# Patient Record
Sex: Male | Born: 1955
Health system: Southern US, Community
[De-identification: ages and names within clinical notes are randomized; demographics above are authoritative.]

## PROBLEM LIST (undated history)

## (undated) DIAGNOSIS — H269 Unspecified cataract: Secondary | ICD-10-CM

## (undated) DIAGNOSIS — M1990 Unspecified osteoarthritis, unspecified site: Secondary | ICD-10-CM

## (undated) DIAGNOSIS — Q825 Congenital non-neoplastic nevus: Secondary | ICD-10-CM

## (undated) DIAGNOSIS — G4730 Sleep apnea, unspecified: Secondary | ICD-10-CM

## (undated) DIAGNOSIS — I219 Acute myocardial infarction, unspecified: Secondary | ICD-10-CM

## (undated) DIAGNOSIS — S42009A Fracture of unspecified part of unspecified clavicle, initial encounter for closed fracture: Secondary | ICD-10-CM

## (undated) DIAGNOSIS — R51 Headache: Secondary | ICD-10-CM

## (undated) DIAGNOSIS — Z86018 Personal history of other benign neoplasm: Secondary | ICD-10-CM

## (undated) DIAGNOSIS — K219 Gastro-esophageal reflux disease without esophagitis: Secondary | ICD-10-CM

## (undated) DIAGNOSIS — C439 Malignant melanoma of skin, unspecified: Secondary | ICD-10-CM

## (undated) DIAGNOSIS — Q2381 Bicuspid aortic valve: Secondary | ICD-10-CM

## (undated) DIAGNOSIS — I350 Nonrheumatic aortic (valve) stenosis: Secondary | ICD-10-CM

## (undated) DIAGNOSIS — Q231 Congenital insufficiency of aortic valve: Secondary | ICD-10-CM

## (undated) DIAGNOSIS — Z86006 Personal history of melanoma in-situ: Secondary | ICD-10-CM

## (undated) DIAGNOSIS — I5189 Other ill-defined heart diseases: Secondary | ICD-10-CM

## (undated) DIAGNOSIS — I77810 Thoracic aortic ectasia: Secondary | ICD-10-CM

## (undated) DIAGNOSIS — R519 Headache, unspecified: Secondary | ICD-10-CM

## (undated) DIAGNOSIS — Q2112 Patent foramen ovale: Secondary | ICD-10-CM

## (undated) DIAGNOSIS — Q211 Atrial septal defect: Secondary | ICD-10-CM

## (undated) DIAGNOSIS — G4733 Obstructive sleep apnea (adult) (pediatric): Secondary | ICD-10-CM

## (undated) DIAGNOSIS — I517 Cardiomegaly: Secondary | ICD-10-CM

## (undated) DIAGNOSIS — R001 Bradycardia, unspecified: Secondary | ICD-10-CM

## (undated) DIAGNOSIS — E785 Hyperlipidemia, unspecified: Secondary | ICD-10-CM

## (undated) DIAGNOSIS — E669 Obesity, unspecified: Secondary | ICD-10-CM

## (undated) DIAGNOSIS — I2510 Atherosclerotic heart disease of native coronary artery without angina pectoris: Secondary | ICD-10-CM

## (undated) DIAGNOSIS — R0989 Other specified symptoms and signs involving the circulatory and respiratory systems: Secondary | ICD-10-CM

## (undated) HISTORY — PX: TONSILLECTOMY: SUR1361

## (undated) HISTORY — DX: Acute myocardial infarction, unspecified: I21.9

## (undated) HISTORY — DX: Thoracic aortic ectasia: I77.810

## (undated) HISTORY — PX: EYE SURGERY: SHX253

## (undated) HISTORY — DX: Atherosclerotic heart disease of native coronary artery without angina pectoris: I25.10

## (undated) HISTORY — DX: Bradycardia, unspecified: R00.1

## (undated) HISTORY — DX: Obesity, unspecified: E66.9

## (undated) HISTORY — DX: Atrial septal defect: Q21.1

## (undated) HISTORY — DX: Sleep apnea, unspecified: G47.30

## (undated) HISTORY — DX: Hyperlipidemia, unspecified: E78.5

## (undated) HISTORY — DX: Congenital insufficiency of aortic valve: Q23.1

## (undated) HISTORY — DX: Obstructive sleep apnea (adult) (pediatric): G47.33

## (undated) HISTORY — DX: Congenital non-neoplastic nevus: Q82.5

## (undated) HISTORY — DX: Cardiomegaly: I51.7

## (undated) HISTORY — DX: Malignant melanoma of skin, unspecified: C43.9

## (undated) HISTORY — DX: Patent foramen ovale: Q21.12

## (undated) HISTORY — DX: Unspecified cataract: H26.9

## (undated) HISTORY — DX: Bicuspid aortic valve: Q23.81

## (undated) HISTORY — DX: Gastro-esophageal reflux disease without esophagitis: K21.9

## (undated) HISTORY — DX: Other ill-defined heart diseases: I51.89

## (undated) HISTORY — DX: Other specified symptoms and signs involving the circulatory and respiratory systems: R09.89

## (undated) HISTORY — DX: Nonrheumatic aortic (valve) stenosis: I35.0

---
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
## 2005-11-15 ENCOUNTER — Emergency Department (HOSPITAL_COMMUNITY): Admission: EM | Admit: 2005-11-15 | Discharge: 2005-11-15 | Payer: Self-pay | Admitting: Emergency Medicine

## 2006-10-05 ENCOUNTER — Emergency Department (HOSPITAL_COMMUNITY): Admission: EM | Admit: 2006-10-05 | Discharge: 2006-10-06 | Payer: Self-pay | Admitting: Emergency Medicine

## 2007-10-28 ENCOUNTER — Emergency Department (HOSPITAL_COMMUNITY): Admission: EM | Admit: 2007-10-28 | Discharge: 2007-10-29 | Payer: Self-pay | Admitting: Emergency Medicine

## 2008-03-30 ENCOUNTER — Encounter: Admission: RE | Admit: 2008-03-30 | Discharge: 2008-03-30 | Payer: Self-pay | Admitting: Gastroenterology

## 2008-12-12 DIAGNOSIS — D239 Other benign neoplasm of skin, unspecified: Secondary | ICD-10-CM

## 2008-12-12 HISTORY — DX: Other benign neoplasm of skin, unspecified: D23.9

## 2009-04-07 DIAGNOSIS — Z86006 Personal history of melanoma in-situ: Secondary | ICD-10-CM

## 2009-04-07 HISTORY — DX: Personal history of melanoma in-situ: Z86.006

## 2009-04-07 HISTORY — PX: MELANOMA EXCISION: SHX5266

## 2009-07-19 ENCOUNTER — Other Ambulatory Visit: Payer: Self-pay | Admitting: Dermatology

## 2011-01-03 LAB — CBC
MCHC: 35.2
MCV: 88.9
Platelets: 169
RBC: 5.29

## 2011-01-03 LAB — URINALYSIS, ROUTINE W REFLEX MICROSCOPIC
Bilirubin Urine: NEGATIVE
Hgb urine dipstick: NEGATIVE
Nitrite: NEGATIVE
Protein, ur: NEGATIVE
Specific Gravity, Urine: 1.02
Urobilinogen, UA: 0.2

## 2011-01-03 LAB — COMPREHENSIVE METABOLIC PANEL
ALT: 37
AST: 34
Albumin: 4.5
CO2: 24
Calcium: 10.1
Creatinine, Ser: 0.94
GFR calc Af Amer: >60
GFR calc non Af Amer: >60
Sodium: 140
Total Protein: 7

## 2011-01-03 LAB — LIPASE, BLOOD: Lipase: 42

## 2011-01-03 LAB — POCT CARDIAC MARKERS
CKMB, poc: 1.8
CKMB, poc: 3.7
Myoglobin, poc: 79.3
Operator id: 264421
Troponin i, poc: <0.05
Troponin i, poc: <0.05

## 2011-01-03 LAB — DIFFERENTIAL
Eosinophils Absolute: 0.1
Eosinophils Relative: 2
Lymphocytes Relative: 29
Lymphs Abs: 1.8
Monocytes Absolute: 0.4
Monocytes Relative: 7

## 2011-01-21 LAB — COMPREHENSIVE METABOLIC PANEL
ALT: 35
Albumin: 4
Calcium: 9
Glucose, Bld: 133 — ABNORMAL HIGH
Potassium: 3.8
Sodium: 141
Total Protein: 6.6

## 2011-01-21 LAB — CBC
Hemoglobin: 15.1
MCHC: 35.4
Platelets: 204
RDW: 12.7

## 2011-01-21 LAB — PROTIME-INR: INR: 1

## 2011-01-21 LAB — APTT: aPTT: 28

## 2011-01-21 LAB — DIFFERENTIAL
Eosinophils Absolute: 0.1
Lymphs Abs: 1.6
Monocytes Absolute: 0.4
Monocytes Relative: 5
Neutro Abs: 6.3
Neutrophils Relative %: 74

## 2011-01-21 LAB — SAMPLE TO BLOOD BANK

## 2013-01-24 ENCOUNTER — Encounter: Payer: Self-pay | Admitting: *Deleted

## 2013-01-24 ENCOUNTER — Encounter: Payer: Self-pay | Admitting: Cardiology

## 2013-01-26 ENCOUNTER — Encounter: Payer: Self-pay | Admitting: Cardiology

## 2013-01-26 DIAGNOSIS — K219 Gastro-esophageal reflux disease without esophagitis: Secondary | ICD-10-CM | POA: Insufficient documentation

## 2013-01-26 DIAGNOSIS — Q2112 Patent foramen ovale: Secondary | ICD-10-CM | POA: Insufficient documentation

## 2013-01-26 DIAGNOSIS — I10 Essential (primary) hypertension: Secondary | ICD-10-CM | POA: Insufficient documentation

## 2013-01-26 DIAGNOSIS — Q211 Atrial septal defect: Secondary | ICD-10-CM | POA: Insufficient documentation

## 2013-01-26 DIAGNOSIS — I77810 Thoracic aortic ectasia: Secondary | ICD-10-CM | POA: Insufficient documentation

## 2013-01-26 DIAGNOSIS — I5189 Other ill-defined heart diseases: Secondary | ICD-10-CM | POA: Insufficient documentation

## 2013-01-26 DIAGNOSIS — E7800 Pure hypercholesterolemia, unspecified: Secondary | ICD-10-CM | POA: Insufficient documentation

## 2013-01-26 DIAGNOSIS — I517 Cardiomegaly: Secondary | ICD-10-CM | POA: Insufficient documentation

## 2013-01-26 DIAGNOSIS — I350 Nonrheumatic aortic (valve) stenosis: Secondary | ICD-10-CM | POA: Insufficient documentation

## 2013-01-27 ENCOUNTER — Ambulatory Visit (INDEPENDENT_AMBULATORY_CARE_PROVIDER_SITE_OTHER): Payer: 59 | Admitting: Cardiology

## 2013-01-27 ENCOUNTER — Encounter: Payer: Self-pay | Admitting: Cardiology

## 2013-01-27 VITALS — BP 152/100 | HR 66 | Ht 69.5 in | Wt 214.0 lb

## 2013-01-27 DIAGNOSIS — I5189 Other ill-defined heart diseases: Secondary | ICD-10-CM

## 2013-01-27 DIAGNOSIS — I519 Heart disease, unspecified: Secondary | ICD-10-CM

## 2013-01-27 DIAGNOSIS — Q2111 Secundum atrial septal defect: Secondary | ICD-10-CM

## 2013-01-27 DIAGNOSIS — I359 Nonrheumatic aortic valve disorder, unspecified: Secondary | ICD-10-CM

## 2013-01-27 DIAGNOSIS — Q211 Atrial septal defect: Secondary | ICD-10-CM

## 2013-01-27 DIAGNOSIS — Q2112 Patent foramen ovale: Secondary | ICD-10-CM

## 2013-01-27 DIAGNOSIS — I10 Essential (primary) hypertension: Secondary | ICD-10-CM

## 2013-01-27 DIAGNOSIS — I77810 Thoracic aortic ectasia: Secondary | ICD-10-CM

## 2013-01-27 DIAGNOSIS — I350 Nonrheumatic aortic (valve) stenosis: Secondary | ICD-10-CM

## 2013-01-27 MED ORDER — HYDROCHLOROTHIAZIDE 25 MG PO TABS: 12.5000 mg | ORAL_TABLET | Freq: Every day | ORAL | Status: DC

## 2013-01-27 NOTE — Progress Notes (Signed)
[address] [place], [place]  [address] Phone: [phone] Fax:  [phone]  Date:  01/27/2013   ID:  Todd Frank [id number], MRN [id number]  PCP:  No primary provider on file.  Cardiologist:  Armanda Magic, MD    History of Present Illness: Todd Frank is a 57 y.o. male with a history of HTN, dyslipidemia, mild to moderate AS, diastolic dysfunction, mildly dilated aortic root and small PFO.  He is doing well.  He denies any chest pain, SOB, DOE, LE edema, dizziness, palpitations or syncope.  He has a history of white coat HTN and runs normal at home.  He works out daily at [employer] on the elliptical and treadmill.   Wt Readings from Last 3 Encounters:  01/27/13 214 lb (97.07 kg)     Past Medical History  Diagnosis Date  . Obesity   . HTN (hypertension)   . GERD (gastroesophageal reflux disease)   . Melanoma of skin   . LVH (left ventricular hypertrophy)   . Melanoma   . Congenital nevus   . Diastolic dysfunction   . Aortic stenosis, mild     mild to moderate AS by echo 07/2012  . Dilated aortic root   . PFO (patent foramen ovale)     small by echo 2014  . Hypercholesteremia   . Hyperlipidemia     Current Outpatient Prescriptions  Medication Sig Dispense Refill  . aspirin 81 MG tablet Take 81 mg by mouth daily.      . cholecalciferol (VITAMIN D) 1000 UNITS tablet Take 1,000 Units by mouth daily.      . fish oil-omega-3 fatty acids 1000 MG capsule Take 1 g by mouth daily.      Marland Kitchen GLUCOSAMINE-CHONDROITIN PO Take 1 tablet by mouth daily.      . hydrochlorothiazide (HYDRODIURIL) 25 MG tablet Take 12.5 mg by mouth daily.       . Lecithin 400 MG CAPS Take by mouth daily.      . sildenafil (VIAGRA) 100 MG tablet Take 100 mg by mouth daily as needed for erectile dysfunction.       No current facility-administered medications for this visit.    Allergies:   No Known Allergies  Social History:  The patient  reports that he has never smoked. He does  not have any smokeless tobacco history on file.   Family History:  The patient's family history includes Diabetes in his mother; Heart attack in an other family member.   ROS:  Please see the history of present illness.      All other systems reviewed and negative.   PHYSICAL EXAM: VS:  BP 152/100  Pulse 66  Ht 5' 9.5" (1.765 m)  Wt 214 lb (97.07 kg)  BMI 31.16 kg/m2 Well nourished, well developed, in no acute distress HEENT: normal Neck: no JVD Cardiac:  normal S1, S2; RRR; 1/6 SM at LLSB to apex Lungs:  clear to auscultation bilaterally, no wheezing, rhonchi or rales Abd: soft, nontender, no hepatomegaly Ext: no edema Skin: warm and dry Neuro:  CNs 2-12 intact, no focal abnormalities noted  EKG:  NSR with nonspecific T wave abnormality inferiorly     ASSESSMENT AND PLAN:  1. HTN - BP elevated today but he has white coat HTN and BP normal at home on readings he brings in today  - continue HCTZ  - continue BMET 2.   Mild to moderate AS  - repeat echo in 07/2013  3.   Mildly dilated aortic root 4.   Diastolic dysfunction 5.   Small PFO  Followup with me in 6 months   Signed, Armanda Magic, MD 01/27/2013 9:16 AM

## 2013-01-27 NOTE — Patient Instructions (Signed)
A refill has been sent to your Pharmacy for the HCTZ  We gave you a rx for a BMET. Please have them fax [place] the results.   Your physician has requested that you have an echocardiogram. Echocardiography is a painless test that uses sound waves to create images of your heart. It provides your doctor with information about the size and shape of your heart and how well your heart's chambers and valves are working. This procedure takes approximately one hour. There are no restrictions for this procedure. (Schedule for April) A recall letter will be sent you to in February to call and schedule.   Your physician wants you to follow-up in: 6 months with Dr. Sherlyn Lick will receive a reminder letter in the mail two months in advance. If you don't receive a letter, please call our office to schedule the follow-up appointment.

## 2013-02-10 ENCOUNTER — Other Ambulatory Visit: Payer: Self-pay

## 2013-04-05 ENCOUNTER — Encounter: Payer: Self-pay | Admitting: Cardiology

## 2013-07-07 ENCOUNTER — Encounter: Payer: Self-pay | Admitting: Cardiology

## 2013-07-11 ENCOUNTER — Encounter: Payer: Self-pay | Admitting: Cardiology

## 2013-07-11 ENCOUNTER — Ambulatory Visit (HOSPITAL_COMMUNITY): Payer: 59 | Attending: Internal Medicine | Admitting: Radiology

## 2013-07-11 DIAGNOSIS — I350 Nonrheumatic aortic (valve) stenosis: Secondary | ICD-10-CM

## 2013-07-11 DIAGNOSIS — I77810 Thoracic aortic ectasia: Secondary | ICD-10-CM

## 2013-07-11 DIAGNOSIS — I359 Nonrheumatic aortic valve disorder, unspecified: Secondary | ICD-10-CM | POA: Insufficient documentation

## 2013-07-11 NOTE — Progress Notes (Signed)
Echocardiogram Performed. 

## 2013-07-12 ENCOUNTER — Encounter: Payer: Self-pay | Admitting: Cardiology

## 2013-07-13 ENCOUNTER — Other Ambulatory Visit: Payer: Self-pay | Admitting: General Surgery

## 2013-07-13 DIAGNOSIS — I77810 Thoracic aortic ectasia: Secondary | ICD-10-CM

## 2013-07-15 ENCOUNTER — Other Ambulatory Visit (HOSPITAL_COMMUNITY): Payer: 59

## 2013-07-20 ENCOUNTER — Encounter: Payer: Self-pay | Admitting: Cardiology

## 2013-07-22 ENCOUNTER — Encounter: Payer: Self-pay | Admitting: Cardiology

## 2013-08-03 ENCOUNTER — Encounter: Payer: Self-pay | Admitting: Cardiology

## 2013-10-11 ENCOUNTER — Other Ambulatory Visit (INDEPENDENT_AMBULATORY_CARE_PROVIDER_SITE_OTHER): Payer: 59

## 2013-10-11 ENCOUNTER — Ambulatory Visit (INDEPENDENT_AMBULATORY_CARE_PROVIDER_SITE_OTHER): Payer: 59 | Admitting: Family Medicine

## 2013-10-11 ENCOUNTER — Encounter: Payer: Self-pay | Admitting: Family Medicine

## 2013-10-11 VITALS — BP 144/88 | HR 68 | Ht 69.5 in | Wt 225.0 lb

## 2013-10-11 DIAGNOSIS — M7631 Iliotibial band syndrome, right leg: Secondary | ICD-10-CM

## 2013-10-11 DIAGNOSIS — M25569 Pain in unspecified knee: Secondary | ICD-10-CM

## 2013-10-11 DIAGNOSIS — IMO0002 Reserved for concepts with insufficient information to code with codable children: Secondary | ICD-10-CM

## 2013-10-11 DIAGNOSIS — M25561 Pain in right knee: Secondary | ICD-10-CM

## 2013-10-11 DIAGNOSIS — S83249A Other tear of medial meniscus, current injury, unspecified knee, initial encounter: Secondary | ICD-10-CM | POA: Insufficient documentation

## 2013-10-11 DIAGNOSIS — M629 Disorder of muscle, unspecified: Secondary | ICD-10-CM

## 2013-10-11 DIAGNOSIS — S83241A Other tear of medial meniscus, current injury, right knee, initial encounter: Secondary | ICD-10-CM

## 2013-10-11 DIAGNOSIS — M7630 Iliotibial band syndrome, unspecified leg: Secondary | ICD-10-CM | POA: Insufficient documentation

## 2013-10-11 MED ORDER — MELOXICAM 15 MG PO TABS: 15.0000 mg | ORAL_TABLET | Freq: Every day | ORAL | Status: DC

## 2013-10-11 NOTE — Assessment & Plan Note (Signed)
I reviewed with the patient the anatomy involved with ITB syndrome.  Given rehab program  - primarily ITB stretching program, core stability program, gluteus medius strengthening, hip flexion strengthening, and core. Additional proprioception and mild plyometrics program reviewed.  Reviewed plan of care and rehab is the primary treatment in this condition. Ten-day course of anti-inflammatories.  Return in 3 weeks for further evaluation and treatment.

## 2013-10-11 NOTE — Assessment & Plan Note (Signed)
Patient has a very mild medial meniscal tear with some very mild displacement. At the patient is going to do very well. We discussed icing, home exercises were given to him, we discussed bracing which patient declined. Patient will try to do these different interventions and come back again in 3 weeks for further evaluation. Discussed with patient is likely take some more the ballpark of 4-6 weeks until improvement.

## 2013-10-11 NOTE — Progress Notes (Signed)
[name] [place], [place] [address] Phone: [phone] Subjective:     CC: Leg and hip pain  Todd Frank is a 58 y.o. male coming in with complaint of right knee pain. Patient states that he's had this pain for a couple months. Patient states most of it seems to be actually near the hip and radiates down the lateral aspect of the leg. Patient states it does pass his knee seldomly. Patient denies any numbness or weakness. Patient states it occurs more after sitting for a long amount of time and gets better actually with activity. States that he is unable to actually make it painful with touch. Denies any back pain. Patient with the severity of 6/10. Patient feels that it is starting to become more chronic and that is what is concerning. Patient also stated 2 weeks ago he started having some more medial knee pain after taking a step wrong. Patient states that he did not have any swelling but was having pain with just regular ambulation. Over the course last 2 weeks so it is 80% better and only giving him some discomfort when he goes downstairs. Denies any catching, clicking, or giving out on him. This is pain as 2/10.     Past medical history, social, surgical and family history all reviewed in electronic medical record.   Review of Systems: No headache, visual changes, nausea, vomiting, diarrhea, constipation, dizziness, abdominal pain, skin rash, fevers, chills, night sweats, weight loss, swollen lymph nodes, body aches, joint swelling, muscle aches, chest pain, shortness of breath, mood changes.   Objective There were no vitals taken for this visit.  General: No apparent distress alert and oriented x3 mood and affect normal, dressed appropriately.  HEENT: Pupils equal, extraocular movements intact  Respiratory: Patient's speak in full sentences and does not appear short of breath  Cardiovascular: No lower extremity edema, non  tender, no erythema  Skin: Warm dry intact with no signs of infection or rash on extremities or on axial skeleton.  Abdomen: Soft nontender  Neuro: Cranial nerves II through XII are intact, neurovascularly intact in all extremities with 2+ DTRs and 2+ pulses.  Lymph: No lymphadenopathy of posterior or anterior cervical chain or axillae bilaterally.  Gait normal with good balance and coordination.  MSK:  Non tender with full range of motion and good stability and symmetric strength and tone of shoulders, elbows, wrist,  and ankles bilaterally.  Hip: Right ROM IR: 35 Deg, ER: 45 Deg, Flexion: 120 Deg, Extension: 100 Deg, Abduction: 45 Deg, Adduction: 45 Deg Strength IR: 5/5, ER: 5/5, Flexion: 5/5, Extension: 5/5, Abduction: 4/5, Adduction: 4/5 Pelvic alignment unremarkable to inspection and palpation. Standing hip rotation and gait without trendelenburg sign / unsteadiness. Greater trochanter without tenderness to palpation. No tenderness over piriformis and greater trochanter. Positive Faber but negative FADIR No SI joint tenderness and normal minimal SI movement. Contralateral hip unremarkable Knee: Right Normal to inspection with no erythema or effusion or obvious bony abnormalities. Palpation normal with no warmth, joint line tenderness, patellar tenderness, or condyle tenderness. ROM full in flexion and extension and lower leg rotation. Ligaments with solid consistent endpoints including ACL, PCL, LCL, MCL. Negative Mcmurray's, Apley's, and Thessalonian tests. Non painful patellar compression. Patellar glide without crepitus. Patellar and quadriceps tendons unremarkable. Hamstring and quadriceps strength is normal. Contralateral knee unremarkable.  MSK US performed of: Right knee This study was ordered, performed, and interpreted by Charlann Boxer D.O.  Knee:  All structures visualized. Anteromedial meniscus does have what appears to be a healing meniscal tear. Patient still has a  very minimal amount of displacement. Patient does have increasing Doppler flow in this area. Anterolateral, posteromedial, and posterolateral menisci unremarkable without tearing, fraying, effusion, or displacement. Patellar Tendon unremarkable on long and transverse views without effusion. No abnormality of prepatellar bursa. LCL and MCL unremarkable on long and transverse views. Patient does have significant thickening of the distal ITB and. No abnormality of origin of medial or lateral head of the gastrocnemius.  IMPRESSION:  Chronic distal ITB band syndrome in healing medial meniscal tear.       Impression and Recommendations:     This case required medical decision making of moderate complexity.

## 2013-10-11 NOTE — Patient Instructions (Addendum)
Good to see you Vitamin D 2000 IU daily Turmeric 500mg  twice daily.  Tell your wife she has you on the good stuff.  Exercises 3 times a week for knee and for IT band.  Exercises on wall.  Heel and butt touching.  Raise leg 6 inches and hold 2 seconds.  Down slow for count of 4 seconds.  1 set of 30 reps daily on both sides.  Keep doing your other exercises but consider decreasing time on treadmill or elliptical and increasing intensity.  Ice 20 minutes after activity.  Meloxicam daily for 10 days then as needed Come back again in 3 weeks.

## 2013-11-01 ENCOUNTER — Ambulatory Visit (INDEPENDENT_AMBULATORY_CARE_PROVIDER_SITE_OTHER): Payer: 59 | Admitting: Family Medicine

## 2013-11-01 ENCOUNTER — Encounter: Payer: Self-pay | Admitting: Family Medicine

## 2013-11-01 VITALS — BP 156/98 | HR 70 | Ht 70.0 in | Wt 225.0 lb

## 2013-11-01 DIAGNOSIS — G5700 Lesion of sciatic nerve, unspecified lower limb: Secondary | ICD-10-CM

## 2013-11-01 DIAGNOSIS — G5701 Lesion of sciatic nerve, right lower limb: Secondary | ICD-10-CM

## 2013-11-01 MED ORDER — CYCLOBENZAPRINE HCL 10 MG PO TABS: 10.0000 mg | ORAL_TABLET | Freq: Three times a day (TID) | ORAL | Status: DC | PRN

## 2013-11-01 NOTE — Patient Instructions (Signed)
Good to see you Lets try more of piriformis syndrome Try the Pennsaid and if you like it call me and I will send you a big bottle.  Look at new handout and do exercises 3 times a week.  Ice is still your friend Exercises on wall still Flexeril if you need it but may make you sleepy. Try at home first. Can take half a pill Tennis ball in back right pocket with driving and sitting at work.   Foam roller is good For calf try a compression sleeve during the day Come back in 1 month.

## 2013-11-01 NOTE — Progress Notes (Signed)
  [name] [place], [place] [address] Phone: [phone] Subjective:     CC: Leg and hip pain  [id number] Todd Frank is a 58 y.o. male coming in with complaint of right knee pain. Patient was seen previously was diagnosed with more of an acute meniscal tear as well as lateral iliotibial band. The patient initially states that the knee pain is completely resolved at this time. Patient actually states more the pain seems to be on the lateral aspect of the leg and now actually over the buttocks region as well. Patient has been doing the exercises and stretches that was given to him previously and was making some improvement. Patient states he has been having more pain in the buttocks though. Patient states that he does the stretches often he doesn't have as much pain. Denies any back pain associated with this. States that it hurts more after sitting for long amount of time.      Past medical history, social, surgical and family history all reviewed in electronic medical record.   Review of Systems: No headache, visual changes, nausea, vomiting, diarrhea, constipation, dizziness, abdominal pain, skin rash, fevers, chills, night sweats, weight loss, swollen lymph nodes, body aches, joint swelling, muscle aches, chest pain, shortness of breath, mood changes.   Objective Blood pressure 156/98, pulse 70, height 5\' 10"  (1.778 m), weight 225 lb (102.059 kg), SpO2 96.00%.  General: No apparent distress alert and oriented x3 mood and affect normal, dressed appropriately.  HEENT: Pupils equal, extraocular movements intact  Respiratory: Patient's speak in full sentences and does not appear short of breath  Cardiovascular: No lower extremity edema, non tender, no erythema  Skin: Warm dry intact with no signs of infection or rash on extremities or on axial skeleton.  Abdomen: Soft nontender  Neuro: Cranial nerves II through XII are intact,  neurovascularly intact in all extremities with 2+ DTRs and 2+ pulses.  Lymph: No lymphadenopathy of posterior or anterior cervical chain or axillae bilaterally.  Gait normal with good balance and coordination.  MSK:  Non tender with full range of motion and good stability and symmetric strength and tone of shoulders, elbows, wrist,  and ankles bilaterally.  Hip: Right ROM IR: 35 Deg, ER: 45 Deg, Flexion: 120 Deg, Extension: 100 Deg, Abduction: 45 Deg, Adduction: 45 Deg Strength IR: 5/5, ER: 5/5, Flexion: 5/5, Extension: 5/5, Abduction: 4/5, Adduction: 4/5 Pelvic alignment unremarkable to inspection and palpation. Standing hip rotation and gait without trendelenburg sign / unsteadiness. Greater trochanter without tenderness to palpation. Tender over the right piriformis Positive Corky Sox but negative FADIR No SI joint tenderness and normal minimal SI movement. Contralateral hip unremarkable Knee: Right Normal to inspection with no erythema or effusion or obvious bony abnormalities. Palpation normal with no warmth, joint line tenderness, patellar tenderness, or condyle tenderness. ROM full in flexion and extension and lower leg rotation. Ligaments with solid consistent endpoints including ACL, PCL, LCL, MCL. Negative Mcmurray's, Apley's, and Thessalonian tests. Non painful patellar compression. Patellar glide without crepitus. Patellar and quadriceps tendons unremarkable. Hamstring and quadriceps strength is normal. Contralateral knee unremarkable.      Impression and Recommendations:     This case required medical decision making of moderate complexity.

## 2013-11-01 NOTE — Assessment & Plan Note (Signed)
I believe patient's pain is secondary more to the piriformis that is giving him some mild radicular symptoms down the lateral aspect of his leg. Patient was getting some benefit from iliotibial band syndrome but now I think will be more benefit with focusing on the piriformis. We discussed manual massage as well as icing protocol. We discussed proper shoe wear and was given patient was given a new home exercise program. Patient will try these interventions and come back and see me again in 3-4 weeks.

## 2013-11-06 ENCOUNTER — Encounter: Payer: Self-pay | Admitting: Family Medicine

## 2013-11-07 MED ORDER — DICLOFENAC SODIUM 2 % TD SOLN: TRANSDERMAL | Status: DC

## 2013-12-02 ENCOUNTER — Encounter: Payer: Self-pay | Admitting: Family Medicine

## 2013-12-02 ENCOUNTER — Other Ambulatory Visit: Payer: Self-pay | Admitting: Family Medicine

## 2013-12-02 ENCOUNTER — Ambulatory Visit (INDEPENDENT_AMBULATORY_CARE_PROVIDER_SITE_OTHER): Payer: 59 | Admitting: Family Medicine

## 2013-12-02 ENCOUNTER — Ambulatory Visit (INDEPENDENT_AMBULATORY_CARE_PROVIDER_SITE_OTHER)
Admission: RE | Admit: 2013-12-02 | Discharge: 2013-12-02 | Disposition: A | Discharge status: Home / Self Care | Payer: 59 | Source: Ambulatory Visit | Attending: Family Medicine | Admitting: Family Medicine

## 2013-12-02 VITALS — BP 140/82 | HR 80 | Ht 70.0 in | Wt 219.0 lb

## 2013-12-02 DIAGNOSIS — M2420 Disorder of ligament, unspecified site: Secondary | ICD-10-CM

## 2013-12-02 DIAGNOSIS — G5700 Lesion of sciatic nerve, unspecified lower limb: Secondary | ICD-10-CM

## 2013-12-02 DIAGNOSIS — IMO0001 Reserved for inherently not codable concepts without codable children: Secondary | ICD-10-CM

## 2013-12-02 DIAGNOSIS — M629 Disorder of muscle, unspecified: Secondary | ICD-10-CM

## 2013-12-02 DIAGNOSIS — G5701 Lesion of sciatic nerve, right lower limb: Secondary | ICD-10-CM

## 2013-12-02 DIAGNOSIS — R253 Fasciculation: Secondary | ICD-10-CM | POA: Insufficient documentation

## 2013-12-02 DIAGNOSIS — R259 Unspecified abnormal involuntary movements: Secondary | ICD-10-CM

## 2013-12-02 DIAGNOSIS — M6289 Other specified disorders of muscle: Secondary | ICD-10-CM | POA: Insufficient documentation

## 2013-12-02 DIAGNOSIS — M791 Myalgia, unspecified site: Secondary | ICD-10-CM

## 2013-12-02 NOTE — Assessment & Plan Note (Signed)
Patient will speak him to formal physical therapy. Continue the other conservative therapy at this time. We discussed different hamstring exercises that could be helpful if the differential also includes a proximal hamstring injury. Patient does have tightness of the hamstring. Patient come back in 4 weeks for further evaluation.

## 2013-12-02 NOTE — Assessment & Plan Note (Signed)
Patient given more hamstring exercises I think will be beneficial. Patient will be sent to formal physical therapy to see if they can tease out the muscle imbalances and palpation in a more complete manner. Patient will continue the home exercises. Patient will come back and see me again in 4 weeks for further evaluation.

## 2013-12-02 NOTE — Progress Notes (Signed)
  [name] [place], [place] [address] Phone: [phone] Subjective:     CC: Leg and hip pain  [id number] Todd Frank is a 58 y.o. male coming in with complaint of right knee pain. Patient was seen previously and states that his knee pain is significantly better. Still has some mild stiffness overall but continues to improve. Patient states though unfortunately he continues to have the right piriformis type syndrome. Patient states with the home exercises as well as the manual massage he has made some improvement but continues to have stiffness especially after activity. Patient states that it is not enough to stop him from any activities and is able to do daily activities including lifting weights. Patient denies any nighttime awakening. Only has discomfort after sitting for a long amount of time. He is having some mild new symptoms with cramping he states in the legs bilaterally. States that this is minimal overall. This seems to be increasing frequency.     Past medical history, social, surgical and family history all reviewed in electronic medical record.   Review of Systems: No headache, visual changes, nausea, vomiting, diarrhea, constipation, dizziness, abdominal pain, skin rash, fevers, chills, night sweats, weight loss, swollen lymph nodes, body aches, joint swelling, muscle aches, chest pain, shortness of breath, mood changes.   Objective Blood pressure 140/82, pulse 80, height 5\' 10"  (1.778 m), weight 219 lb (99.338 kg), SpO2 97.00%.  General: No apparent distress alert and oriented x3 mood and affect normal, dressed appropriately.  HEENT: Pupils equal, extraocular movements intact  Respiratory: Patient's speak in full sentences and does not appear short of breath  Cardiovascular: No lower extremity edema, non tender, no erythema  Skin: Warm dry intact with no signs of infection or rash on extremities or on axial skeleton.    Abdomen: Soft nontender  Neuro: Cranial nerves II through XII are intact, neurovascularly intact in all extremities with 2+ DTRs and 2+ pulses.  Lymph: No lymphadenopathy of posterior or anterior cervical chain or axillae bilaterally.  Gait normal with good balance and coordination.  MSK:  Non tender with full range of motion and good stability and symmetric strength and tone of shoulders, elbows, wrist,  and ankles bilaterally.  Hip: Right ROM IR: 35 Deg, ER: 45 Deg, Flexion: 120 Deg, Extension: 100 Deg, Abduction: 45 Deg, Adduction: 45 Deg Strength IR: 5/5, ER: 5/5, Flexion: 5/5, Extension: 5/5, Abduction: 4/5, Adduction: 4/5 Pelvic alignment unremarkable to inspection and palpation. Standing hip rotation and gait without trendelenburg sign / unsteadiness. Greater trochanter without tenderness to palpation. Mild tender over the right piriformis Positive Corky Sox but significantly less tender than previous exam negative FADIR No SI joint tenderness and normal minimal SI movement. Contralateral hip unremarkable Knee: Right Normal to inspection with no erythema or effusion or obvious bony abnormalities. Palpation normal with no warmth, joint line tenderness, patellar tenderness, or condyle tenderness. ROM full in flexion and extension and lower leg rotation. Ligaments with solid consistent endpoints including ACL, PCL, LCL, MCL. Negative Mcmurray's, Apley's, and Thessalonian tests. Non painful patellar compression. Patellar glide without crepitus. Patellar and quadriceps tendons unremarkable. Hamstring and quadriceps strength is normal. Patient is tight on his right hamstring compared to the contralateral side. Contralateral knee unremarkable. No fasciculations noted today on legs.      Impression and Recommendations:     This case required medical decision making of moderate complexity.

## 2013-12-02 NOTE — Patient Instructions (Signed)
Great to see you Continue what you are doing  Add in the hamstring stretches Physical therapy will be calling you.  Xrays downstairs today.  Labs at [facility].  See you again in 4 weeks.

## 2013-12-02 NOTE — Assessment & Plan Note (Signed)
Patient is giving history of some fasciculations. With the patient having chronic tendinopathy sit appears to do think that further workup is warranted. Labs ordered today to rule out any thyroid, iron deficiency, or inflammation that could be contributing.

## 2013-12-03 LAB — VITAMIN B12: VITAMIN B 12: 305 pg/mL (ref 211–946)

## 2013-12-03 LAB — SEDIMENTATION RATE: Sed Rate: 2 mm/hr (ref 0–30)

## 2013-12-03 LAB — T4, FREE: Free T4: 1.17 ng/dL (ref 0.82–1.77)

## 2013-12-03 LAB — T3, FREE: T3 FREE: 2.9 pg/mL (ref 2.0–4.4)

## 2013-12-03 LAB — IRON: Iron: 81 ug/dL (ref 40–155)

## 2013-12-03 LAB — VITAMIN D 25 HYDROXY (VIT D DEFICIENCY, FRACTURES): Vit D, 25-Hydroxy: 35 ng/mL (ref 30.0–100.0)

## 2013-12-03 LAB — TSH: TSH: 1.79 u[IU]/mL (ref 0.450–4.500)

## 2013-12-30 ENCOUNTER — Encounter: Payer: Self-pay | Admitting: Family Medicine

## 2013-12-30 ENCOUNTER — Ambulatory Visit (INDEPENDENT_AMBULATORY_CARE_PROVIDER_SITE_OTHER): Payer: 59 | Admitting: Family Medicine

## 2013-12-30 VITALS — BP 142/86 | HR 79 | Ht 70.0 in | Wt 224.0 lb

## 2013-12-30 DIAGNOSIS — G5701 Lesion of sciatic nerve, right lower limb: Secondary | ICD-10-CM

## 2013-12-30 DIAGNOSIS — M629 Disorder of muscle, unspecified: Secondary | ICD-10-CM

## 2013-12-30 DIAGNOSIS — G5700 Lesion of sciatic nerve, unspecified lower limb: Secondary | ICD-10-CM

## 2013-12-30 NOTE — Progress Notes (Signed)
  [facility] [place],  [address] Phone: [phone] Subjective:     CC: Leg and hip pain  [id number] Todd Frank is a 58 y.o. male coming in with complaint of right knee pain. She was found to have an iliotibial band syndrome as well as piriformis syndrome. Patient was having improvement of the iliotibial band to continue to have piriformis syndrome. Patient was sent to formal physical therapy. Patient states that he continues to improve. Patient is able to do all activities of daily living. Still has some mild discomfort that seems to be in the bilateral buttocks now more than truly the right side. Patient states though that he is able to sleep comfortably at night. Patient has not taken any pain medications at this time. Patient of course would like to be pain free but is happy with the results so far.     Past medical history, social, surgical and family history all reviewed in electronic medical record.   Review of Systems: No headache, visual changes, nausea, vomiting, diarrhea, constipation, dizziness, abdominal pain, skin rash, fevers, chills, night sweats, weight loss, swollen lymph nodes, body aches, joint swelling, muscle aches, chest pain, shortness of breath, mood changes.   Objective Blood pressure 142/86, pulse 79, height 5\' 10"  (1.778 m), weight 224 lb (101.606 kg), SpO2 95.00%.  General: No apparent distress alert and oriented x3 mood and affect normal, dressed appropriately.  HEENT: Pupils equal, extraocular movements intact  Respiratory: Patient's speak in full sentences and does not appear short of breath  Cardiovascular: No lower extremity edema, non tender, no erythema  Skin: Warm dry intact with no signs of infection or rash on extremities or on axial skeleton.  Abdomen: Soft nontender  Neuro: Cranial nerves II through XII are intact, neurovascularly intact in all extremities with 2+ DTRs and 2+ pulses.  Lymph:  No lymphadenopathy of posterior or anterior cervical chain or axillae bilaterally.  Gait normal with good balance and coordination.  MSK:  Non tender with full range of motion and good stability and symmetric strength and tone of shoulders, elbows, wrist,  and ankles bilaterally.  Hip: Right ROM IR: 35 Deg, ER: 45 Deg, Flexion: 120 Deg, Extension: 100 Deg, Abduction: 45 Deg, Adduction: 45 Deg Strength IR: 5/5, ER: 5/5, Flexion: 5/5, Extension: 5/5, Abduction: 4/5, Adduction: 4/5 Pelvic alignment unremarkable to inspection and palpation. Standing hip rotation and gait without trendelenburg sign / unsteadiness. Greater trochanter without tenderness to palpation. Patient is actually more tender over the ischial bursitis bilaterally today. Positive Faber but significantly less tender than previous exam negative FADIR No SI joint tenderness and normal minimal SI movement. Contralateral hip unremarkable Knee: Right Normal to inspection with no erythema or effusion or obvious bony abnormalities. Palpation normal with no warmth, joint line tenderness, patellar tenderness, or condyle tenderness. ROM full in flexion and extension and lower leg rotation. Ligaments with solid consistent endpoints including ACL, PCL, LCL, MCL. Negative Mcmurray's, Apley's, and Thessalonian tests. Non painful patellar compression. Patellar glide without crepitus. Patellar and quadriceps tendons unremarkable. Hamstring and quadriceps strength is normal. Patient is tight on his right hamstring compared to the contralateral side. Contralateral knee unremarkable. No fasciculations noted today on legs.      Impression and Recommendations:     This case required medical decision making of moderate complexity.

## 2013-12-30 NOTE — Assessment & Plan Note (Signed)
The patient continues to improve overall. Patient does have some mild to moderate osteoarthritic changes of the lower back and we'll continue to monitor. Patient is responding to conservative therapy so we will make no changes at this time. Patient was given some strengthening exercises to try to help with the hamstrings. Patient was showed proper technique today. Patient will come back and see me again in 3-4 weeks

## 2013-12-30 NOTE — Patient Instructions (Signed)
Good to see you Hamstring and nordic  Exercises only 15 degrees and 2 sets of 10 reps 3 times a week and increase angle when appropriate it.  Ice is your friend.  Compression sleeve on hamstring when exercises.  Continue physical therapy Come back in 4-6 weeks and if not perfect we will inject ischial bursae.

## 2013-12-30 NOTE — Assessment & Plan Note (Signed)
I believe the patient still has significant tightness of the hamstrings bilaterally and may have some mild ischial bursitis overall. Patient has been making improvement so with formal physical therapy and I would like to continue for another 4 weeks. We discussed continuing icing and we talked about the potential of compression sleeves to the intrinsic to be beneficial. We discussed ice after activity still. We discussed using the over-the-counter anti-inflammatories on an as-needed basis. Patient will come back and see me again in 4 weeks for further evaluation. Continuing to have difficulty like to do an ultrasound to rule out ischial bursitis and questionable interventional  Spent greater than 25 minutes with patient face-to-face and had greater than 50% of counseling including as described above in assessment and plan.

## 2014-02-22 ENCOUNTER — Other Ambulatory Visit: Payer: Self-pay | Admitting: *Deleted

## 2014-02-22 MED ORDER — HYDROCHLOROTHIAZIDE 25 MG PO TABS: 12.5000 mg | ORAL_TABLET | Freq: Every day | ORAL | Status: DC

## 2014-06-21 ENCOUNTER — Telehealth: Payer: Self-pay | Admitting: Cardiology

## 2014-06-21 NOTE — Telephone Encounter (Signed)
New Message  Pt called to sched echo, was told there was a carotid recall in system. Pt requested to speak w/ Rn about upcoming appts- if he needs both; please call back and discuss.

## 2014-06-21 NOTE — Telephone Encounter (Signed)
Instructed [place] to schedule only ECHO. Patient is one year behind on appointments.

## 2014-06-22 ENCOUNTER — Other Ambulatory Visit: Payer: Self-pay | Admitting: Radiology

## 2014-06-22 DIAGNOSIS — I350 Nonrheumatic aortic (valve) stenosis: Secondary | ICD-10-CM

## 2014-07-04 ENCOUNTER — Other Ambulatory Visit: Payer: Self-pay | Admitting: Cardiology

## 2014-07-04 MED ORDER — HYDROCHLOROTHIAZIDE 25 MG PO TABS: 12.5000 mg | ORAL_TABLET | Freq: Every day | ORAL | Status: DC

## 2014-07-11 ENCOUNTER — Other Ambulatory Visit (INDEPENDENT_AMBULATORY_CARE_PROVIDER_SITE_OTHER): Payer: 59

## 2014-07-11 ENCOUNTER — Other Ambulatory Visit: Payer: Self-pay

## 2014-07-11 DIAGNOSIS — I712 Thoracic aortic aneurysm, without rupture: Secondary | ICD-10-CM | POA: Diagnosis not present

## 2014-07-11 DIAGNOSIS — I359 Nonrheumatic aortic valve disorder, unspecified: Secondary | ICD-10-CM

## 2014-07-11 DIAGNOSIS — I350 Nonrheumatic aortic (valve) stenosis: Secondary | ICD-10-CM

## 2014-07-13 ENCOUNTER — Telehealth: Payer: Self-pay | Admitting: Cardiology

## 2014-07-13 NOTE — Telephone Encounter (Signed)
pt rtn call re results, pls call

## 2014-07-13 NOTE — Telephone Encounter (Signed)
The pt is advised of his Echo results and he verbalized understanding.

## 2014-08-02 ENCOUNTER — Encounter: Payer: Self-pay | Admitting: Family Medicine

## 2014-08-02 ENCOUNTER — Ambulatory Visit (INDEPENDENT_AMBULATORY_CARE_PROVIDER_SITE_OTHER): Payer: 59 | Admitting: Family Medicine

## 2014-08-02 VITALS — BP 130/90 | HR 78 | Ht 70.0 in | Wt 212.0 lb

## 2014-08-02 DIAGNOSIS — G5701 Lesion of sciatic nerve, right lower limb: Secondary | ICD-10-CM

## 2014-08-02 MED ORDER — GABAPENTIN 100 MG PO CAPS: 100.0000 mg | ORAL_CAPSULE | Freq: Every day | ORAL | Status: DC

## 2014-08-02 NOTE — Assessment & Plan Note (Signed)
Patient does have more of the worsening of a piriformis syndrome with an do think sciatica secondary to compression on the nerve. We have discussed different treatment options and patient has elected to try gabapentin. Patient was given a prescription as well patient given home exercises well. Patient would like to try the injection. Patient will bring a driver at his next follow-up and we will discuss potentially doing an injection the piriformis muscle to see if this will be therapeutic as well as potentially diagnostic.  If patient does not respond to any these conservative therapies further imaging is necessary especially for lumbar MRI.  Spent  25 minutes with patient face-to-face and had greater than 50% of counseling including as described above in assessment and plan.

## 2014-08-02 NOTE — Progress Notes (Signed)
Pre visit review using our clinic review tool, if applicable. No additional management support is needed unless otherwise documented below in the visit note. 

## 2014-08-02 NOTE — Patient Instructions (Addendum)
Good to see you.  Ice will still be your friend.  Continue with the tennis ball We will do injection Monday Try medicine 3 times a day for 3 days Gabapentin at night, it is at your pharmacy.  See you soon.

## 2014-08-02 NOTE — Progress Notes (Signed)
  [name] [address], [place] [address] Phone: [phone] Subjective:     CC: Leg and hip pain  PPJ:[id number] JACERE PANGBORN is a 59 y.o. male coming in with complaint of right knee pain. She was found to have an iliotibial band syndrome as well as piriformis syndrome. Patient had been doing relatively well but continues to have pain. Patient states that it never became pain-free. Patient can do daily activities but after sitting a long amount of time had severe pain in has almost a dull aching sensation that seems to go down the posterior as well as lateral aspects of the leg all the way to his foot. Patient states that this is somewhat new and is a little more concerning. States that the back pain seems to be well controlled though.    patient did have imaging previously on August 2015. Patient does have some mild degenerative changes of the hips bilaterally as well as mild osteophytic changes of the lumbar spine but no acute findings.  Past medical history, social, surgical and family history all reviewed in electronic medical record.   Review of Systems: No headache, visual changes, nausea, vomiting, diarrhea, constipation, dizziness, abdominal pain, skin rash, fevers, chills, night sweats, weight loss, swollen lymph nodes, body aches, joint swelling, muscle aches, chest pain, shortness of breath, mood changes.   Objective Blood pressure 130/90, pulse 78, height 5\' 10"  (1.778 m), weight 212 lb (96.163 kg), SpO2 97 %.  General: No apparent distress alert and oriented x3 mood and affect normal, dressed appropriately.  HEENT: Pupils equal, extraocular movements intact  Respiratory: Patient's speak in full sentences and does not appear short of breath  Cardiovascular: No lower extremity edema, non tender, no erythema  Skin: Warm dry intact with no signs of infection or rash on extremities or on axial skeleton.  Abdomen: Soft nontender  Neuro:  Cranial nerves II through XII are intact, neurovascularly intact in all extremities with 2+ DTRs and 2+ pulses.  Lymph: No lymphadenopathy of posterior or anterior cervical chain or axillae bilaterally.  Gait normal with good balance and coordination.  MSK:  Non tender with full range of motion and good stability and symmetric strength and tone of shoulders, elbows, wrist,  and ankles bilaterally.  Hip: Right ROM IR: 35 Deg, ER: 45 Deg, Flexion: 120 Deg, Extension: 100 Deg, Abduction: 45 Deg, Adduction: 45 Deg Strength IR: 5/5, ER: 5/5, Flexion: 5/5, Extension: 5/5, Abduction: 4/5, Adduction: 4/5 Pelvic alignment unremarkable to inspection and palpation. Does have some tightness compared to the contralateral side. Standing hip rotation and gait without trendelenburg sign / unsteadiness. Greater trochanter without tenderness to palpation. Patient is actually more tender over the ischial bursitis bilaterally today. Positive Faber  negative FADIR No SI joint tenderness and normal minimal SI movement. Contralateral hip unremarkable       Impression and Recommendations:     This case required medical decision making of moderate complexity.

## 2014-08-07 ENCOUNTER — Ambulatory Visit (INDEPENDENT_AMBULATORY_CARE_PROVIDER_SITE_OTHER): Payer: 59 | Admitting: Family Medicine

## 2014-08-07 ENCOUNTER — Other Ambulatory Visit (INDEPENDENT_AMBULATORY_CARE_PROVIDER_SITE_OTHER): Payer: 59

## 2014-08-07 ENCOUNTER — Encounter: Payer: Self-pay | Admitting: Family Medicine

## 2014-08-07 VITALS — BP 116/82 | HR 60 | Ht 70.0 in | Wt 224.0 lb

## 2014-08-07 DIAGNOSIS — M79604 Pain in right leg: Secondary | ICD-10-CM

## 2014-08-07 DIAGNOSIS — G5701 Lesion of sciatic nerve, right lower limb: Secondary | ICD-10-CM | POA: Diagnosis not present

## 2014-08-07 NOTE — Progress Notes (Signed)
Pre visit review using our clinic review tool, if applicable. No additional management support is needed unless otherwise documented below in the visit note. 

## 2014-08-07 NOTE — Progress Notes (Signed)
[name] [address], [place] [address] Phone: [phone] Subjective:     CC: Leg and hip pain  QQP:[id number] Todd Frank is a 59 y.o. male coming in with complaint of right knee pain. She was found to have an iliotibial band syndrome as well as piriformis syndrome. Patient states that the iliotibial band seem to improve but unfortunately patient continued to have more of a piriformis syndrome. Patient is partially has sciatica secondary to this. Patient even sometimes considered some weakness of this lower extremity. Patient does not feel that he has had back pain at any point during this time. Patient is wondering to potentially have an injection. He wanted to potentially do this before any advanced imaging. Patient is having difficult even doing some of his daily activities and patient made minimal benefit since last office visit within the last couple weeks.    patient did have imaging previously on August 2015. Patient does have some mild degenerative changes of the hips bilaterally as well as mild osteophytic changes of the lumbar spine but no acute findings.  Past medical history, social, surgical and family history all reviewed in electronic medical record.   Review of Systems: No headache, visual changes, nausea, vomiting, diarrhea, constipation, dizziness, abdominal pain, skin rash, fevers, chills, night sweats, weight loss, swollen lymph nodes, body aches, joint swelling, muscle aches, chest pain, shortness of breath, mood changes.   Objective There were no vitals taken for this visit.  General: No apparent distress alert and oriented x3 mood and affect normal, dressed appropriately.  HEENT: Pupils equal, extraocular movements intact  Respiratory: Patient's speak in full sentences and does not appear short of breath  Cardiovascular: No lower extremity edema, non tender, no erythema  Skin: Warm dry intact with no signs of infection or  rash on extremities or on axial skeleton.  Abdomen: Soft nontender  Neuro: Cranial nerves II through XII are intact, neurovascularly intact in all extremities with 2+ DTRs and 2+ pulses.  Lymph: No lymphadenopathy of posterior or anterior cervical chain or axillae bilaterally.  Gait normal with good balance and coordination.  MSK:  Non tender with full range of motion and good stability and symmetric strength and tone of shoulders, elbows, wrist,  and ankles bilaterally.  Hip: Right ROM IR: 35 Deg, ER: 45 Deg, Flexion: 120 Deg, Extension: 100 Deg, Abduction: 45 Deg, Adduction: 45 Deg Strength IR: 5/5, ER: 5/5, Flexion: 5/5, Extension: 5/5, Abduction: 4/5, Adduction: 4/5 Pelvic alignment unremarkable to inspection and palpation. Does have some tightness compared to the contralateral side. Standing hip rotation and gait without trendelenburg sign / unsteadiness. Greater trochanter without tenderness to palpation. Patient is actually more tender over the ischial bursitis bilaterally today. Positive Faber  negative FADIR No SI joint tenderness and normal minimal SI movement. Contralateral hip unremarkable  Procedure: Real-time Ultrasound Guided Injection of right piriformis muscle Device: GE Logiq E  Ultrasound guided injection is preferred based studies that show increased duration, increased effect, greater accuracy, decreased procedural pain, increased response rate, and decreased cost with ultrasound guided versus blind injection.  Verbal informed consent obtained.  Time-out conducted.  Noted no overlying erythema, induration, or other signs of local infection.  Skin prepped in a sterile fashion.  Local anesthesia: Topical Ethyl chloride.  With sterile technique and under real time ultrasound guidance: With a 21-gauge 3-1/2 inch needle patient was injected with 2 mL of 0.5% Marcaine and 1 mL of Kenalog 40 mg/dL.  Completed  without difficulty  Pain immediately resolved suggesting accurate  placement of the medication.  Advised to call if fevers/chills, erythema, induration, drainage, or persistent bleeding.  Images permanently stored and available for review in the ultrasound unit.  Impression: Technically successful ultrasound guided injection.     Impression and Recommendations:     This case required medical decision making of moderate complexity.

## 2014-08-07 NOTE — Assessment & Plan Note (Signed)
Patient was given an injection today with near complete resolution of pain. This is a optimistic finding. Open that we have at least uses for diagnostic as well as therapeutic. Patient continues to have difficulty we may need to consider further imaging but I'm hoping that patient responds well. Patient will follow-up again in 2-3 weeks for further evaluation and treatment.

## 2014-08-07 NOTE — Patient Instructions (Signed)
Good to see you We injected the Piriformis and will see how it goes.  Continue the exercises and icing.  Especially ice in 6 hours.  Continue the good shoes.  Avoid any increase in activity including working out next 48 hours.  Follow up with me again in 2-3 weeks.

## 2014-08-21 ENCOUNTER — Ambulatory Visit: Payer: 59 | Admitting: Family Medicine

## 2014-08-22 ENCOUNTER — Encounter: Payer: Self-pay | Admitting: Cardiology

## 2014-08-22 ENCOUNTER — Ambulatory Visit (INDEPENDENT_AMBULATORY_CARE_PROVIDER_SITE_OTHER): Payer: 59 | Admitting: Cardiology

## 2014-08-22 ENCOUNTER — Ambulatory Visit (INDEPENDENT_AMBULATORY_CARE_PROVIDER_SITE_OTHER): Payer: 59 | Admitting: Family Medicine

## 2014-08-22 ENCOUNTER — Encounter: Payer: Self-pay | Admitting: Family Medicine

## 2014-08-22 VITALS — BP 130/84 | HR 75 | Ht 70.0 in | Wt 204.0 lb

## 2014-08-22 VITALS — BP 136/92 | HR 67 | Ht 70.0 in | Wt 202.8 lb

## 2014-08-22 DIAGNOSIS — I350 Nonrheumatic aortic (valve) stenosis: Secondary | ICD-10-CM

## 2014-08-22 DIAGNOSIS — G5701 Lesion of sciatic nerve, right lower limb: Secondary | ICD-10-CM

## 2014-08-22 DIAGNOSIS — I77810 Thoracic aortic ectasia: Secondary | ICD-10-CM | POA: Diagnosis not present

## 2014-08-22 DIAGNOSIS — I10 Essential (primary) hypertension: Secondary | ICD-10-CM | POA: Diagnosis not present

## 2014-08-22 MED ORDER — HYDROCHLOROTHIAZIDE 25 MG PO TABS: 12.5000 mg | ORAL_TABLET | Freq: Every day | ORAL | Status: DC

## 2014-08-22 NOTE — Progress Notes (Signed)
Pre visit review using our clinic review tool, if applicable. No additional management support is needed unless otherwise documented below in the visit note. 

## 2014-08-22 NOTE — Progress Notes (Signed)
  [name] [address], [place] [address] Phone: [phone] Subjective:     CC: Leg and hip pain follow-up  AXK:PVVZSMOLMB Todd Frank is a 59 y.o. male coming in with complaint of right knee pain. She was found to have an iliotibial band syndrome as well as piriformis syndrome. Patient was having worsening symptoms of the right piriformis and patient was given a steroid injection at last exam. Patient states that he is approximately 80% better. States that the severity as well as the impact on his daily activities is significantly less. Patient states that he is no longer having nighttime pain and states that he can do all daily activity. Patient is very happy with the results at this time.    patient did have imaging previously on August 2015. Patient does have some mild degenerative changes of the hips bilaterally as well as mild osteophytic changes of the lumbar spine but no acute findings.  Past medical history, social, surgical and family history all reviewed in electronic medical record.   Review of Systems: No headache, visual changes, nausea, vomiting, diarrhea, constipation, dizziness, abdominal pain, skin rash, fevers, chills, night sweats, weight loss, swollen lymph nodes, body aches, joint swelling, muscle aches, chest pain, shortness of breath, mood changes.   Objective Blood pressure 130/84, pulse 75, height 5\' 10"  (1.778 m), weight 204 lb (92.534 kg), SpO2 97 %.  General: No apparent distress alert and oriented x3 mood and affect normal, dressed appropriately.  HEENT: Pupils equal, extraocular movements intact  Respiratory: Patient's speak in full sentences and does not appear short of breath  Cardiovascular: No lower extremity edema, non tender, no erythema  Skin: Warm dry intact with no signs of infection or rash on extremities or on axial skeleton.  Abdomen: Soft nontender  Neuro: Cranial nerves II through XII are intact,  neurovascularly intact in all extremities with 2+ DTRs and 2+ pulses.  Lymph: No lymphadenopathy of posterior or anterior cervical chain or axillae bilaterally.  Gait normal with good balance and coordination.  MSK:  Non tender with full range of motion and good stability and symmetric strength and tone of shoulders, elbows, wrist,  and ankles bilaterally.  Hip: Right ROM  IR: 35 Deg, ER: 45 Deg, Flexion: 120 Deg, Extension: 100 Deg, Abduction: 45 Deg, Adduction: 45 Deg Strength IR: 5/5, ER: 5/5, Flexion: 5/5, Extension: 5/5, Abduction: 4/5, Adduction: 4/5 Pelvic alignment unremarkable to inspection and palpation. Improved range of motion of the right hip compared to previous exam. Standing hip rotation and gait without trendelenburg sign / unsteadiness. Greater trochanter without tenderness to palpation. Significant decrease in tenderness over the ischial bursitis. Negative Corky Sox but still tighter than contralateral side  negative FADIR No SI joint tenderness and normal minimal SI movement. Contralateral hip unremarkable       Impression and Recommendations:     This case required medical decision making of moderate complexity.

## 2014-08-22 NOTE — Patient Instructions (Signed)
Medication Instructions:  Your physician has recommended you make the following change in your medication:  1) START HCTZ 25 mg (1/2) tablet daily   Labwork: None  Testing/Procedures: Your physician has requested that you have an echocardiogram. Echocardiography is a painless test that uses sound waves to create images of your heart. It provides your doctor with information about the size and shape of your heart and how well your heart's chambers and valves are working. This procedure takes approximately one hour. There are no restrictions for this procedure.  Follow-Up: Your physician wants you to follow-up in: 1 year with [name].  You will receive a reminder letter in the mail two months in advance. If you don't receive a letter, please call our office to schedule the follow-up appointment.   Any Other Special Instructions Will Be Listed Below (If Applicable).

## 2014-08-22 NOTE — Patient Instructions (Signed)
Verbal instructions given

## 2014-08-22 NOTE — Addendum Note (Signed)
Addended by: Harland German A on: 08/22/2014 10:37 AM   Modules accepted: Orders

## 2014-08-22 NOTE — Progress Notes (Signed)
Cardiology Office Note   Date:  08/22/2014   ID:  MAXON KRESSE, DOB December 22, 1955, MRN [id number]  PCP:  Gennette Pac, MD    Chief Complaint  Patient presents with  . Follow-up    Aortic Stenosis,mild, Hypertension      History of Present Illness: Todd Frank is a 59 y.o. male with a history of HTN, dyslipidemia, mild AS with bicuspid AV, diastolic dysfunction, mildly dilated aortic root and small PFO. He is doing well. He denies any chest pain, SOB, DOE, LE edema, dizziness, palpitations or syncope. He has a history of white coat HTN and runs normal at home. He works out daily at [employer] on the elliptical and treadmill.    Past Medical History  Diagnosis Date  . Obesity   . HTN (hypertension)   . GERD (gastroesophageal reflux disease)   . Melanoma of skin   . LVH (left ventricular hypertrophy)   . Melanoma   . Congenital nevus   . Diastolic dysfunction   . Aortic stenosis, mild     mild to moderate AS by echo 07/2012  . Dilated aortic root   . PFO (patent foramen ovale)     small by echo 2014  . Hypercholesteremia   . Hyperlipidemia     Past Surgical History  Procedure Laterality Date  . Tonsillectomy    . Eye surgery    . Melanoma excision  2011    left thigh     Current Outpatient Prescriptions  Medication Sig Dispense Refill  . aspirin 81 MG tablet Take 81 mg by mouth daily.    Marland Kitchen FEVERFEW PO Take 1 capsule by mouth daily.    . fish oil-omega-3 fatty acids 1000 MG capsule Take 1 g by mouth daily.    . Glucosamine-Vitamin D (GLUCOSAMINE PLUS VITAMIN D) 1500-200 MG-UNIT TABS Take 2 tablets by mouth daily.    . hydrochlorothiazide (HYDRODIURIL) 25 MG tablet Take 0.5 tablets (12.5 mg total) by mouth daily. 30 tablet 0  . sildenafil (VIAGRA) 100 MG tablet Take 100 mg by mouth daily as needed for erectile dysfunction.    . Turmeric Curcumin 500 MG CAPS Take 1 capsule by mouth 2 (two) times daily.    . vitamin B-12 (CYANOCOBALAMIN) 1000 MCG tablet  Take 1,000 mcg by mouth daily.     No current facility-administered medications for this visit.    Allergies:   Review of patient's allergies indicates no known allergies.    Social History:  The patient  reports that he has never smoked. He does not have any smokeless tobacco history on file.   Family History:  The patient's family history includes Diabetes in his mother; Heart attack in an other family member.    ROS:  Please see the history of present illness.   Otherwise, review of systems are positive for none.   All other systems are reviewed and negative.    PHYSICAL EXAM: VS:  BP 136/92 mmHg  Pulse 67  Ht 5\' 10"  (1.778 m)  Wt 202 lb 12.8 oz (91.989 kg)  BMI 29.10 kg/m2 , BMI Body mass index is 29.1 kg/(m^2). GEN: Well nourished, well developed, in no acute distress HEENT: normal Neck: no JVD, carotid bruits, or masses Cardiac: RRR; no murmurs, rubs, or gallops,no edema  Respiratory:  clear to auscultation bilaterally, normal work of breathing GI: soft, nontender, nondistended, + BS MS: no deformity or atrophy Skin: warm and dry, no rash Neuro:  Strength and sensation are intact  Psych: euthymic mood, full affect   EKG:  EKG is ordered today. The ekg ordered today demonstrates NSR with inferior infarct and no ST changes   Recent Labs: 12/02/2013: TSH 1.790    Lipid Panel No results found for: CHOL, TRIG, HDL, CHOLHDL, VLDL, LDLCALC, LDLDIRECT    Wt Readings from Last 3 Encounters:  08/22/14 202 lb 12.8 oz (91.989 kg)  08/07/14 224 lb (101.606 kg)  08/02/14 212 lb (96.163 kg)    ASSESSMENT AND PLAN: 1.  HTN - BP elevated today but he has white coat HTN and BP normal at home on readings he brings in today - continue HCTZ 2. Mild AS with possible bicuspid AV 3. Mildly dilated aortic root  (17mm) - repeat echo 07/2015 4. Diastolic dysfunction 5. Small PFO     Current medicines are reviewed at length with the patient today.  The patient  does not have concerns regarding medicines.  The following changes have been made:  no change  Labs/ tests ordered today include: see above assessment and plan No orders of the defined types were placed in this encounter.     Disposition:   FU with me in 1 year   Signed, [name], MD  08/22/2014 8:54 AM    [facility] [address], [place], [place]  [address] Phone: [phone]; Fax: [phone]

## 2014-08-22 NOTE — Assessment & Plan Note (Signed)
Patient is doing much better. We discussed icing regimen as well as the home exercises and encouraged patient to continue to focus on hip abductor strengthening. We discussed continuing to wear good shoes. I believe the patient will do very well with conservative therapy. If any worsening symptoms we can either repeat critical steroid injection every 3 months if needed. Advance imaging could be necessary which would include lumbar radiculopathy as well as potentially MRI of the hip. I think that this is highly unlikely. Patient could also be a candidate for PRP injections if necessary. We discussed all this in greater detail. Patient will follow-up with me in 4-6 weeks for further evaluation.

## 2014-10-05 ENCOUNTER — Ambulatory Visit: Payer: 59 | Admitting: Family Medicine

## 2015-02-02 ENCOUNTER — Ambulatory Visit (INDEPENDENT_AMBULATORY_CARE_PROVIDER_SITE_OTHER): Payer: 59 | Admitting: Unknown Physician Specialty

## 2015-02-02 ENCOUNTER — Encounter: Payer: Self-pay | Admitting: Unknown Physician Specialty

## 2015-02-02 VITALS — BP 129/83 | HR 76 | Temp 98.8°F | Ht 69.1 in | Wt 195.6 lb

## 2015-02-02 DIAGNOSIS — I10 Essential (primary) hypertension: Secondary | ICD-10-CM | POA: Diagnosis not present

## 2015-02-02 DIAGNOSIS — N529 Male erectile dysfunction, unspecified: Secondary | ICD-10-CM | POA: Diagnosis not present

## 2015-02-02 DIAGNOSIS — Z23 Encounter for immunization: Secondary | ICD-10-CM | POA: Diagnosis not present

## 2015-02-02 DIAGNOSIS — Z0000 Encounter for general adult medical examination without abnormal findings: Secondary | ICD-10-CM | POA: Diagnosis not present

## 2015-02-02 NOTE — Assessment & Plan Note (Signed)
Per cardiology 

## 2015-02-02 NOTE — Progress Notes (Signed)
    BP 129/83 mmHg  Pulse 76  Temp(Src) 98.8 F (37.1 C)  Ht 5' 9.1" (1.755 m)  Wt 195 lb 9.6 oz (88.724 kg)  BMI 28.81 kg/m2  SpO2 97%   Subjective:    Patient ID: Todd Frank, male    DOB: May 29, 1955, 59 y.o.   MRN: [id number]  HPI: Todd Frank is a 59 y.o. male  Chief Complaint  Patient presents with  . Establish Care   Sees a cardiologist due to PFO and aortic stenosis.  Gets an echo.    Sees an orthopedist for chronic right knee pain and thigh pain.    Depression screen PHQ 2/9 02/02/2015  Decreased Interest 0  Down, Depressed, Hopeless 0  PHQ - 2 Score 0    Relevant past medical, surgical, family and social history reviewed and updated as indicated. Interim medical history since our last visit reviewed. Allergies and medications reviewed and updated.  Review of Systems  Constitutional: Negative.   HENT: Negative.   Eyes: Negative.   Respiratory: Negative.   Cardiovascular: Negative.   Gastrointestinal: Negative.   Endocrine: Negative.   Genitourinary: Negative.   Musculoskeletal:       Right knee and thigh pain  Skin: Negative.   Allergic/Immunologic: Negative.   Neurological: Negative.   Hematological: Negative.   Psychiatric/Behavioral: Negative.     Per HPI unless specifically indicated above     Objective:    BP 129/83 mmHg  Pulse 76  Temp(Src) 98.8 F (37.1 C)  Ht 5' 9.1" (1.755 m)  Wt 195 lb 9.6 oz (88.724 kg)  BMI 28.81 kg/m2  SpO2 97%  Wt Readings from Last 3 Encounters:  02/02/15 195 lb 9.6 oz (88.724 kg)  08/22/14 204 lb (92.534 kg)  08/22/14 202 lb 12.8 oz (91.989 kg)    Physical Exam  Constitutional: He is oriented to person, place, and time. He appears well-developed and well-nourished.  HENT:  Head: Normocephalic.  Eyes: Pupils are equal, round, and reactive to light.  Cardiovascular: Normal rate, regular rhythm and normal heart sounds.   Pulmonary/Chest: Effort normal.  Abdominal: Soft. Bowel sounds are normal.   Musculoskeletal: Normal range of motion.  Neurological: He is alert and oriented to person, place, and time. He has normal reflexes.  Skin: Skin is warm and dry.  Psychiatric: He has a normal mood and affect. His behavior is normal. Judgment and thought content normal.      Assessment & Plan:   Problem List Items Addressed This Visit      Unprioritized   HTN (hypertension)   ED (erectile dysfunction)    Other Visit Diagnoses    Immunization due    -  Primary    Relevant Orders    Flu Vaccine QUAD 36+ mos PF IM (Fluarix & Fluzone Quad PF) (Completed)    Annual physical exam            Follow up plan: No Follow-up on file.

## 2015-02-03 LAB — COMPREHENSIVE METABOLIC PANEL
A/G RATIO: 2.3 (ref 1.1–2.5)
ALBUMIN: 4.5 g/dL (ref 3.6–4.8)
ALT: 19 IU/L (ref 0–44)
AST: 24 IU/L (ref 0–40)
Alkaline Phosphatase: 82 IU/L (ref 39–117)
BUN / CREAT RATIO: 18 (ref 10–22)
BUN: 18 mg/dL (ref 8–27)
Bilirubin Total: 0.5 mg/dL (ref 0.0–1.2)
CALCIUM: 9.4 mg/dL (ref 8.6–10.2)
CO2: 27 mmol/L (ref 18–29)
CREATININE: 0.98 mg/dL (ref 0.76–1.27)
Chloride: 102 mmol/L (ref 97–106)
GFR, EST AFRICAN AMERICAN: 96 mL/min/{1.73_m2} (ref >59)
GFR, EST NON AFRICAN AMERICAN: 83 mL/min/{1.73_m2} (ref >59)
GLOBULIN, TOTAL: 2 g/dL (ref 1.5–4.5)
Glucose: 68 mg/dL (ref 65–99)
POTASSIUM: 4.3 mmol/L (ref 3.5–5.2)
SODIUM: 143 mmol/L (ref 136–144)
Total Protein: 6.5 g/dL (ref 6.0–8.5)

## 2015-02-03 LAB — CBC WITH DIFFERENTIAL/PLATELET
BASOS: 1 %
Basophils Absolute: 0.1 10*3/uL (ref 0.0–0.2)
EOS (ABSOLUTE): 0.1 10*3/uL (ref 0.0–0.4)
EOS: 2 %
HEMATOCRIT: 46.3 % (ref 37.5–51.0)
HEMOGLOBIN: 15.7 g/dL (ref 12.6–17.7)
IMMATURE GRANULOCYTES: 0 %
Immature Grans (Abs): 0 10*3/uL (ref 0.0–0.1)
Lymphocytes Absolute: 1.1 10*3/uL (ref 0.7–3.1)
Lymphs: 27 %
MCH: 31.2 pg (ref 26.6–33.0)
MCHC: 33.9 g/dL (ref 31.5–35.7)
MCV: 92 fL (ref 79–97)
MONOCYTES: 10 %
MONOS ABS: 0.4 10*3/uL (ref 0.1–0.9)
NEUTROS PCT: 60 %
Neutrophils Absolute: 2.5 10*3/uL (ref 1.4–7.0)
Platelets: 163 10*3/uL (ref 150–379)
RBC: 5.04 x10E6/uL (ref 4.14–5.80)
RDW: 13.5 % (ref 12.3–15.4)
WBC: 4.2 10*3/uL (ref 3.4–10.8)

## 2015-02-03 LAB — LIPID PANEL W/O CHOL/HDL RATIO
CHOLESTEROL TOTAL: 190 mg/dL (ref 100–199)
HDL: 42 mg/dL (ref >39)
LDL CALC: 104 mg/dL — AB (ref 0–99)
Triglycerides: 218 mg/dL — ABNORMAL HIGH (ref 0–149)
VLDL CHOLESTEROL CAL: 44 mg/dL — AB (ref 5–40)

## 2015-02-03 LAB — HIV ANTIBODY (ROUTINE TESTING W REFLEX): HIV Screen 4th Generation wRfx: NONREACTIVE

## 2015-02-03 LAB — PSA: Prostate Specific Ag, Serum: 1 ng/mL (ref 0.0–4.0)

## 2015-02-03 LAB — TSH: TSH: 1.58 u[IU]/mL (ref 0.450–4.500)

## 2015-02-03 LAB — URIC ACID: URIC ACID: 6 mg/dL (ref 3.7–8.6)

## 2015-02-03 LAB — HEPATITIS C ANTIBODY: Hep C Virus Ab: <0.1 s/co ratio (ref 0.0–0.9)

## 2015-02-05 ENCOUNTER — Encounter: Payer: Self-pay | Admitting: Unknown Physician Specialty

## 2015-02-06 ENCOUNTER — Encounter: Payer: Self-pay | Admitting: Unknown Physician Specialty

## 2015-02-06 ENCOUNTER — Encounter: Payer: Self-pay | Admitting: Gastroenterology

## 2015-02-07 NOTE — Telephone Encounter (Signed)
error 

## 2015-04-08 HISTORY — PX: OTHER SURGICAL HISTORY: SHX169

## 2015-05-09 ENCOUNTER — Encounter: Payer: Self-pay | Admitting: Cardiology

## 2015-05-14 ENCOUNTER — Encounter: Payer: Self-pay | Admitting: Unknown Physician Specialty

## 2015-05-14 ENCOUNTER — Ambulatory Visit (INDEPENDENT_AMBULATORY_CARE_PROVIDER_SITE_OTHER): Payer: 59 | Admitting: Unknown Physician Specialty

## 2015-05-14 VITALS — BP 154/95 | HR 60 | Temp 98.6°F | Ht 69.6 in | Wt 202.8 lb

## 2015-05-14 DIAGNOSIS — J309 Allergic rhinitis, unspecified: Secondary | ICD-10-CM | POA: Diagnosis not present

## 2015-05-14 MED ORDER — FLUTICASONE PROPIONATE 50 MCG/ACT NA SUSP: 2.0000 | Freq: Every day | NASAL | Status: DC

## 2015-05-14 NOTE — Assessment & Plan Note (Signed)
Rx for Flonase. 

## 2015-05-14 NOTE — Patient Instructions (Signed)
Allergic Rhinitis Allergic rhinitis is when the mucous membranes in the nose respond to allergens. Allergens are particles in the air that cause your body to have an allergic reaction. This causes you to release allergic antibodies. Through a chain of events, these eventually cause you to release histamine into the blood stream. Although meant to protect the body, it is this release of histamine that causes your discomfort, such as frequent sneezing, congestion, and an itchy, runny nose.  CAUSES Seasonal allergic rhinitis (hay fever) is caused by pollen allergens that may come from grasses, trees, and weeds. Year-round allergic rhinitis (perennial allergic rhinitis) is caused by allergens such as house dust mites, pet dander, and mold spores. SYMPTOMS  Nasal stuffiness (congestion).  Itchy, runny nose with sneezing and tearing of the eyes. DIAGNOSIS Your health care provider can help you determine the allergen or allergens that trigger your symptoms. If you and your health care provider are unable to determine the allergen, skin or blood testing may be used. Your health care provider will diagnose your condition after taking your health history and performing a physical exam. Your health care provider may assess you for other related conditions, such as asthma, pink eye, or an ear infection. TREATMENT Allergic rhinitis does not have a cure, but it can be controlled by:  Medicines that block allergy symptoms. These may include allergy shots, nasal sprays, and oral antihistamines.  Avoiding the allergen. Hay fever may often be treated with antihistamines in pill or nasal spray forms. Antihistamines block the effects of histamine. There are over-the-counter medicines that may help with nasal congestion and swelling around the eyes. Check with your health care provider before taking or giving this medicine. If avoiding the allergen or the medicine prescribed do not work, there are many new medicines  your health care provider can prescribe. Stronger medicine may be used if initial measures are ineffective. Desensitizing injections can be used if medicine and avoidance does not work. Desensitization is when a patient is given ongoing shots until the body becomes less sensitive to the allergen. Make sure you follow up with your health care provider if problems continue. HOME CARE INSTRUCTIONS It is not possible to completely avoid allergens, but you can reduce your symptoms by taking steps to limit your exposure to them. It helps to know exactly what you are allergic to so that you can avoid your specific triggers. SEEK MEDICAL CARE IF:  You have a fever.  You develop a cough that does not stop easily (persistent).  You have shortness of breath.  You start wheezing.  Symptoms interfere with normal daily activities.   This information is not intended to replace advice given to you by your health care provider. Make sure you discuss any questions you have with your health care provider.   Document Released: 12/17/2000 Document Revised: 04/14/2014 Document Reviewed: 11/29/2012 Elsevier Interactive Patient Education 2016 [employer]  

## 2015-05-14 NOTE — Progress Notes (Signed)
+--    BP 154/95 mmHg  Pulse 60  Temp(Src) 98.6 F (37 C)  Ht 5' 9.6" (1.768 m)  Wt 202 lb 12.8 oz (91.989 kg)  BMI 29.43 kg/m2  SpO2 99%   Subjective:    Patient ID: Todd Frank, male    DOB: [birth year], 60 y.o.   MRN: [id number]  HPI: Todd Frank is a 61 y.o. male  Chief Complaint  Patient presents with  . Sinusitis    pt states he has sinus pressure around his eyes and ears. States he has squeaking/fluid in ears when blowing nose, occasional ringing in both ears and sometimes sensitive to loud noise. States this has been going on for a while.    Pt is complaining of pressure around sinuses with above symptoms for months and has had sinus issues for years.  Symptoms seem to come and go.  Hasn't taken anything other than the occasional anti-histamine and decongestant which seems to help at times. He has an occasional light-headedness that comes and goes.  No real vertigo  No room spinning.    Relevant past medical, surgical, family and social history reviewed and updated as indicated. Interim medical history since our last visit reviewed. Allergies and medications reviewed and updated.  Review of Systems  Per HPI unless specifically indicated above     Objective:    BP 154/95 mmHg  Pulse 60  Temp(Src) 98.6 F (37 C)  Ht 5' 9.6" (1.768 m)  Wt 202 lb 12.8 oz (91.989 kg)  BMI 29.43 kg/m2  SpO2 99%  Wt Readings from Last 3 Encounters:  05/14/15 202 lb 12.8 oz (91.989 kg)  02/02/15 195 lb 9.6 oz (88.724 kg)  08/22/14 204 lb (92.534 kg)    Physical Exam  Constitutional: He is oriented to person, place, and time. He appears well-developed and well-nourished. No distress.  HENT:  Head: Normocephalic and atraumatic.  Right Ear: Tympanic membrane and ear canal normal.  Left Ear: Tympanic membrane and ear canal normal.  Nose: Rhinorrhea present. Right sinus exhibits no maxillary sinus tenderness and no frontal sinus tenderness. Left sinus exhibits no maxillary sinus  tenderness and no frontal sinus tenderness.  Mouth/Throat: Uvula is midline. Posterior oropharyngeal edema present.  Eyes: Conjunctivae and lids are normal. Right eye exhibits no discharge. Left eye exhibits no discharge. No scleral icterus.  Neck: Neck supple.  Cardiovascular: Normal rate, regular rhythm and normal heart sounds.   Pulmonary/Chest: Effort normal and breath sounds normal. No respiratory distress.  Abdominal: Normal appearance. There is no splenomegaly or hepatomegaly.  Musculoskeletal: Normal range of motion.  Neurological: He is alert and oriented to person, place, and time.  Skin: Skin is warm, dry and intact. No rash noted. No pallor.  Psychiatric: He has a normal mood and affect. His behavior is normal. Judgment and thought content normal.  Nursing note and vitals reviewed.    Assessment & Plan:   Problem List Items Addressed This Visit      Unprioritized   Allergic rhinitis - Primary    Rx for Flonase.            Follow up plan: Return in about 2 months (around 07/12/2015).

## 2015-07-09 ENCOUNTER — Ambulatory Visit (HOSPITAL_COMMUNITY): Payer: 59 | Attending: Cardiovascular Disease

## 2015-07-09 ENCOUNTER — Other Ambulatory Visit: Payer: Self-pay

## 2015-07-09 DIAGNOSIS — I352 Nonrheumatic aortic (valve) stenosis with insufficiency: Secondary | ICD-10-CM | POA: Diagnosis not present

## 2015-07-09 DIAGNOSIS — I350 Nonrheumatic aortic (valve) stenosis: Secondary | ICD-10-CM

## 2015-07-09 DIAGNOSIS — E7800 Pure hypercholesterolemia, unspecified: Secondary | ICD-10-CM | POA: Insufficient documentation

## 2015-07-09 DIAGNOSIS — I119 Hypertensive heart disease without heart failure: Secondary | ICD-10-CM | POA: Diagnosis not present

## 2015-07-09 DIAGNOSIS — I77810 Thoracic aortic ectasia: Secondary | ICD-10-CM | POA: Diagnosis not present

## 2015-07-10 ENCOUNTER — Telehealth: Payer: Self-pay

## 2015-07-10 DIAGNOSIS — I350 Nonrheumatic aortic (valve) stenosis: Secondary | ICD-10-CM

## 2015-07-10 DIAGNOSIS — I77810 Thoracic aortic ectasia: Secondary | ICD-10-CM

## 2015-07-10 DIAGNOSIS — I517 Cardiomegaly: Secondary | ICD-10-CM

## 2015-07-10 NOTE — Telephone Encounter (Signed)
Repeat ECHO ordered to be scheduled in 1 year. 

## 2015-07-10 NOTE — Telephone Encounter (Signed)
-----   Message from [name], MD sent at 07/09/2015  9:47 PM EDT ----- There is moderate AS and trivial AI,  With mildly dilated ascending aorta.  Repeat echo in 1 year

## 2015-07-23 ENCOUNTER — Encounter: Payer: Self-pay | Admitting: Cardiology

## 2015-07-23 ENCOUNTER — Ambulatory Visit (INDEPENDENT_AMBULATORY_CARE_PROVIDER_SITE_OTHER): Payer: 59 | Admitting: Cardiology

## 2015-07-23 VITALS — BP 136/92 | HR 54 | Ht 69.5 in | Wt 198.1 lb

## 2015-07-23 DIAGNOSIS — Q211 Atrial septal defect: Secondary | ICD-10-CM | POA: Diagnosis not present

## 2015-07-23 DIAGNOSIS — I10 Essential (primary) hypertension: Secondary | ICD-10-CM | POA: Diagnosis not present

## 2015-07-23 DIAGNOSIS — Q2112 Patent foramen ovale: Secondary | ICD-10-CM

## 2015-07-23 DIAGNOSIS — I77810 Thoracic aortic ectasia: Secondary | ICD-10-CM

## 2015-07-23 DIAGNOSIS — I350 Nonrheumatic aortic (valve) stenosis: Secondary | ICD-10-CM | POA: Diagnosis not present

## 2015-07-23 DIAGNOSIS — R001 Bradycardia, unspecified: Secondary | ICD-10-CM | POA: Diagnosis not present

## 2015-07-23 HISTORY — DX: Bradycardia, unspecified: R00.1

## 2015-07-23 NOTE — Patient Instructions (Signed)
Medication Instructions:  Your physician recommends that you continue on your current medications as directed. Please refer to the Current Medication list given to you today.   Labwork: Your physician recommends that you have FASTING lab work.  Testing/Procedures: [name] recommends you have a CHEST CT in 6 months.  Follow-Up: Your physician wants you to follow-up in: 6 months with [name]. You will receive a reminder letter in the mail two months in advance. If you don't receive a letter, please call our office to schedule the follow-up appointment.   Any Other Special Instructions Will Be Listed Below (If Applicable).     If you need a refill on your cardiac medications before your next appointment, please call your pharmacy.

## 2015-07-23 NOTE — Progress Notes (Signed)
Cardiology Office Note    Date:  07/23/2015   ID:  Todd Frank Jan 21, 1956, MRN [id number]  PCP:  Kathrine Haddock, NP  Cardiologist:  [name], MD   No chief complaint on file.   History of Present Illness:  Todd Frank is a 60 y.o. male with a history of HTN, dyslipidemia, mild AS with bicuspid AV, diastolic dysfunction, mildly dilated aortic root and small PFO. He is doing well. He denies any chest pain, SOB, DOE, LE edema,  palpitations or syncope. He has a history of white coat HTN and runs normal at home. He works out daily at [employer] on the elliptical and treadmill.  Occasionally he will have a very brief episode of dizziness and pressure in his ear which usually occurs when sitting down at work.       Past Medical History  Diagnosis Date  . Obesity   . HTN (hypertension)   . GERD (gastroesophageal reflux disease)   . Melanoma of skin (Todd Frank)   . LVH (left ventricular hypertrophy)   . Melanoma ([place])   . Congenital nevus   . Diastolic dysfunction   . Aortic stenosis, mild     mild to moderate AS by echo 07/2012  . Dilated aortic root (Todd Frank)   . PFO (patent foramen ovale)     small by echo 2014  . Hypercholesteremia   . Hyperlipidemia   . Bradycardia 07/23/2015    Past Surgical History  Procedure Laterality Date  . Tonsillectomy    . Eye surgery    . Melanoma excision  2011    left thigh    Current Medications: Outpatient Prescriptions Prior to Visit  Medication Sig Dispense Refill  . aspirin 81 MG tablet Take 81 mg by mouth daily.    Marland Kitchen FEVERFEW PO Take 1 capsule by mouth daily.    . fish oil-omega-3 fatty acids 1000 MG capsule Take 1 g by mouth daily.    . fluticasone (FLONASE) 50 MCG/ACT nasal spray Place 2 sprays into both nostrils daily. 16 g 12  . Glucosamine-Vitamin D (GLUCOSAMINE PLUS VITAMIN D) 1500-200 MG-UNIT TABS Take 2 tablets by mouth daily.    . hydrochlorothiazide (HYDRODIURIL) 25 MG tablet Take 0.5 tablets (12.5 mg total) by  mouth daily. 30 tablet 11  . Turmeric Curcumin 500 MG CAPS Take 1 capsule by mouth 2 (two) times daily.    . vitamin B-12 (CYANOCOBALAMIN) 1000 MCG tablet Take 1,000 mcg by mouth daily.    . sildenafil (VIAGRA) 100 MG tablet Take 100 mg by mouth daily as needed for erectile dysfunction. Reported on 07/23/2015     No facility-administered medications prior to visit.     Allergies:   Review of patient's allergies indicates no known allergies.   Social History   Social History  . Marital Status: Married    Spouse Name: N/A  . Number of Children: N/A  . Years of Education: N/A   Social History Main Topics  . Smoking status: Former Research scientist (life sciences)  . Smokeless tobacco: Never Used  . Alcohol Use: 0.6 oz/week    1 Cans of beer per week  . Drug Use: No  . Sexual Activity: Yes   Other Topics Concern  . None   Social History Narrative     Family History:  The patient's family history includes Arthritis in his father; Diabetes in his mother; Heart disease in his paternal grandmother.   ROS:   Please see the history of present illness.  ROS All other systems reviewed and are negative.   PHYSICAL EXAM:   VS:  BP 136/92 mmHg  Pulse 54  Ht 5' 9.5" (1.765 m)  Wt 198 lb 1.9 oz (89.867 kg)  BMI 28.85 kg/m2   GEN: Well nourished, well developed, in no acute distress HEENT: normal Neck: no JVD, carotid bruits, or masses Cardiac: RRR; no murmurs, rubs, or gallops,no edema.  Intact distal pulses bilaterally.  Respiratory:  clear to auscultation bilaterally, normal work of breathing GI: soft, nontender, nondistended, + BS MS: no deformity or atrophy Skin: warm and dry, no rash Neuro:  Alert and Oriented x 3, Strength and sensation are intact Psych: euthymic mood, full affect  Wt Readings from Last 3 Encounters:  07/23/15 198 lb 1.9 oz (89.867 kg)  05/14/15 202 lb 12.8 oz (91.989 kg)  02/02/15 195 lb 9.6 oz (88.724 kg)      Studies/Labs Reviewed:   EKG:  EKG is ordered today and  showed sinus bradycardia at 54 bpm with no ST changes  Recent Labs: 02/02/2015: ALT 19; BUN 18; Creatinine, Ser 0.98; Platelets 163; Potassium 4.3; Sodium 143; TSH 1.580   Lipid Panel    Component Value Date/Time   CHOL 190 02/02/2015 1447   TRIG 218* 02/02/2015 1447   HDL 42 02/02/2015 1447   LDLCALC 104* 02/02/2015 1447    Additional studies/ records that were reviewed today include:  none    ASSESSMENT:    1. Aortic stenosis, mild   2. Essential hypertension   3. PFO (patent foramen ovale)   4. Dilated aortic root (Todd Frank)   5. Bradycardia      PLAN:  In order of problems listed above:  1.  AS with bicuspid AV.  His most recent echo showed progression of AS to moderate with mild AI.  He is asymptomatic.  Will repeat echo in 1 year.   2. HTN - borderline controlled on current regimen.  BP at home running 130/80's.  Continue diuretic. Check BMET. 3. PFO - not noted on most recent echo 4. Dilated ascending aorta at 67mm - repeat echo in 1 year for progression. I am going to order an chest CT in 6 months since he has not had one yet and aortic dimensions went from 60mm to 22mm in past year.     Need aggressive risk factor modification with BP and lipid control.  Continue ASA.  Check FLP and ALT.  Last LDL in the fall was 104 and goal is < 70.   5. Bradycardia - he is asymptomatic.       Medication Adjustments/Labs and Tests Ordered: Current medicines are reviewed at length with the patient today.  Concerns regarding medicines are outlined above.  Medication changes, Labs and Tests ordered today are listed in the Patient Instructions below. There are no Patient Instructions on file for this visit.   Lurena Nida, MD  07/23/2015 11:08 AM    [facility] [address], [place], [place]  [address] Phone: [phone]; Fax: [phone]

## 2015-07-24 ENCOUNTER — Other Ambulatory Visit: Payer: Self-pay | Admitting: Cardiology

## 2015-07-25 ENCOUNTER — Encounter: Payer: Self-pay | Admitting: Cardiology

## 2015-07-25 LAB — BASIC METABOLIC PANEL
BUN / CREAT RATIO: 13 (ref 10–24)
BUN: 12 mg/dL (ref 8–27)
CO2: 22 mmol/L (ref 18–29)
CREATININE: 0.93 mg/dL (ref 0.76–1.27)
Calcium: 9.3 mg/dL (ref 8.6–10.2)
Chloride: 101 mmol/L (ref 96–106)
GFR calc Af Amer: 103 mL/min/{1.73_m2} (ref >59)
GFR, EST NON AFRICAN AMERICAN: 89 mL/min/{1.73_m2} (ref >59)
Glucose: 89 mg/dL (ref 65–99)
POTASSIUM: 4.2 mmol/L (ref 3.5–5.2)
Sodium: 138 mmol/L (ref 134–144)

## 2015-07-25 LAB — LIPID PANEL W/O CHOL/HDL RATIO
Cholesterol, Total: 201 mg/dL — ABNORMAL HIGH (ref 100–199)
HDL: 49 mg/dL (ref >39)
LDL CALC: 129 mg/dL — AB (ref 0–99)
Triglycerides: 117 mg/dL (ref 0–149)
VLDL Cholesterol Cal: 23 mg/dL (ref 5–40)

## 2015-07-25 LAB — HEPATIC FUNCTION PANEL
ALBUMIN: 4.8 g/dL (ref 3.6–4.8)
ALK PHOS: 72 IU/L (ref 39–117)
ALT: 19 IU/L (ref 0–44)
AST: 23 IU/L (ref 0–40)
BILIRUBIN TOTAL: 0.9 mg/dL (ref 0.0–1.2)
BILIRUBIN, DIRECT: 0.2 mg/dL (ref 0.00–0.40)
Total Protein: 6.8 g/dL (ref 6.0–8.5)

## 2015-07-30 ENCOUNTER — Telehealth: Payer: Self-pay | Admitting: Cardiology

## 2015-07-30 NOTE — Telephone Encounter (Signed)
Informed patient of results and verbal understanding expressed.  Patient st he will not start statin at this time, he will watch diet and exercise more for the time being.  He is having repeat lab work in a few weeks.  He st he will entertain the idea of a statin at that time if numbers do not improve.

## 2015-07-30 NOTE — Telephone Encounter (Signed)
-----   Message from [name], MD sent at 07/27/2015 10:45 AM EDT ----- LDL not at goal (<70) - start Lipitor 10mg  daily and recheck FLp and ALT in 6 weeks

## 2015-07-30 NOTE — Telephone Encounter (Signed)
Follow Up  ° °Pt returned the call  °

## 2015-08-07 ENCOUNTER — Emergency Department
Admission: EM | Admit: 2015-08-07 | Discharge: 2015-08-08 | Disposition: A | Discharge status: Home / Self Care | Payer: 59 | Attending: Emergency Medicine | Admitting: Emergency Medicine

## 2015-08-07 ENCOUNTER — Emergency Department: Payer: 59

## 2015-08-07 DIAGNOSIS — E785 Hyperlipidemia, unspecified: Secondary | ICD-10-CM | POA: Diagnosis not present

## 2015-08-07 DIAGNOSIS — I10 Essential (primary) hypertension: Secondary | ICD-10-CM | POA: Diagnosis not present

## 2015-08-07 DIAGNOSIS — Z87891 Personal history of nicotine dependence: Secondary | ICD-10-CM | POA: Insufficient documentation

## 2015-08-07 DIAGNOSIS — R55 Syncope and collapse: Secondary | ICD-10-CM | POA: Insufficient documentation

## 2015-08-07 DIAGNOSIS — Z7951 Long term (current) use of inhaled steroids: Secondary | ICD-10-CM | POA: Insufficient documentation

## 2015-08-07 DIAGNOSIS — Z79899 Other long term (current) drug therapy: Secondary | ICD-10-CM | POA: Diagnosis not present

## 2015-08-07 DIAGNOSIS — E669 Obesity, unspecified: Secondary | ICD-10-CM | POA: Insufficient documentation

## 2015-08-07 DIAGNOSIS — I517 Cardiomegaly: Secondary | ICD-10-CM | POA: Diagnosis not present

## 2015-08-07 DIAGNOSIS — Z7982 Long term (current) use of aspirin: Secondary | ICD-10-CM | POA: Diagnosis not present

## 2015-08-07 DIAGNOSIS — R42 Dizziness and giddiness: Secondary | ICD-10-CM | POA: Diagnosis present

## 2015-08-07 LAB — URINALYSIS COMPLETE WITH MICROSCOPIC (ARMC ONLY)
BILIRUBIN URINE: NEGATIVE
GLUCOSE, UA: NEGATIVE mg/dL
HGB URINE DIPSTICK: NEGATIVE
Ketones, ur: NEGATIVE mg/dL
LEUKOCYTES UA: NEGATIVE
Nitrite: NEGATIVE
PH: 6 (ref 5.0–8.0)
Protein, ur: NEGATIVE mg/dL
SPECIFIC GRAVITY, URINE: 1.023 (ref 1.005–1.030)

## 2015-08-07 LAB — URINE DRUG SCREEN, QUALITATIVE (ARMC ONLY)
Amphetamines, Ur Screen: NOT DETECTED
BARBITURATES, UR SCREEN: NOT DETECTED
BENZODIAZEPINE, UR SCRN: NOT DETECTED
CANNABINOID 50 NG, UR ~~LOC~~: NOT DETECTED
COCAINE METABOLITE, UR ~~LOC~~: NOT DETECTED
MDMA (Ecstasy)Ur Screen: NOT DETECTED
METHADONE SCREEN, URINE: NOT DETECTED
OPIATE, UR SCREEN: NOT DETECTED
Phencyclidine (PCP) Ur S: NOT DETECTED
Tricyclic, Ur Screen: NOT DETECTED

## 2015-08-07 LAB — CBC
HCT: 43.8 % (ref 40.0–52.0)
Hemoglobin: 15.1 g/dL (ref 13.0–18.0)
MCH: 30.6 pg (ref 26.0–34.0)
MCHC: 34.4 g/dL (ref 32.0–36.0)
MCV: 88.8 fL (ref 80.0–100.0)
Platelets: 125 10*3/uL — ABNORMAL LOW (ref 150–440)
RBC: 4.93 MIL/uL (ref 4.40–5.90)
RDW: 13.4 % (ref 11.5–14.5)
WBC: 4.5 10*3/uL (ref 3.8–10.6)

## 2015-08-07 LAB — BASIC METABOLIC PANEL
ANION GAP: 11 (ref 5–15)
BUN: 20 mg/dL (ref 6–20)
CHLORIDE: 106 mmol/L (ref 101–111)
CO2: 23 mmol/L (ref 22–32)
Calcium: 9 mg/dL (ref 8.9–10.3)
Creatinine, Ser: 0.9 mg/dL (ref 0.61–1.24)
GFR calc Af Amer: >60 mL/min (ref >60)
GFR calc non Af Amer: >60 mL/min (ref >60)
GLUCOSE: 126 mg/dL — AB (ref 65–99)
POTASSIUM: 3.6 mmol/L (ref 3.5–5.1)
Sodium: 140 mmol/L (ref 135–145)

## 2015-08-07 LAB — TROPONIN I: Troponin I: <0.03 ng/mL (ref <0.031)

## 2015-08-07 NOTE — ED Provider Notes (Signed)
[facility] Emergency Department Provider Note   ____________________________________________  Time seen: Approximately 11:26 PM  I have reviewed the triage vital signs and the nursing notes.   HISTORY  Chief Complaint Loss of Consciousness    HPI Todd Frank is a 60 y.o. male with a history of aortic stenosis. Patient reports he was sitting down and stood up quickly got a little lightheaded then went to eat and ate a big meal went to watch TV stood up from that quickly and then got dizzy lightheaded and clammy fell down didn't quite pass out but almost did. He denied any shortness of breath or chest pain. He's never had this happen to him before. He feels okay now.   Past Medical History  Diagnosis Date  . Obesity   . HTN (hypertension)   . GERD (gastroesophageal reflux disease)   . Melanoma of skin ([place])   . LVH (left ventricular hypertrophy)   . Melanoma ([place])   . Congenital nevus   . Diastolic dysfunction   . Aortic stenosis, mild     mild to moderate AS by echo 07/2012  . Dilated aortic root ([place])   . PFO (patent foramen ovale)     small by echo 2014  . Hypercholesteremia   . Hyperlipidemia   . Bradycardia 07/23/2015    Patient Active Problem List   Diagnosis Date Noted  . Bradycardia 07/23/2015  . Allergic rhinitis 05/14/2015  . ED (erectile dysfunction) 02/02/2015  . Hamstring tightness 12/02/2013  . Fasciculations of muscle 12/02/2013  . Piriformis syndrome of right side 11/01/2013  . Iliotibial band syndrome 10/11/2013  . Aortic stenosis, mild 01/26/2013  . HTN (hypertension) 01/26/2013  . Hypercholesteremia   . LVH (left ventricular hypertrophy)   . PFO (patent foramen ovale)   . Diastolic dysfunction   . GERD (gastroesophageal reflux disease)   . Dilated aortic root [facility])     Past Surgical History  Procedure Laterality Date  . Tonsillectomy    . Eye surgery    . Melanoma excision  2011    left thigh    Current  Outpatient Rx  Name  Route  Sig  Dispense  Refill  . aspirin 81 MG tablet   Oral   Take 81 mg by mouth daily.         Marland Kitchen FEVERFEW PO   Oral   Take 1 capsule by mouth daily.         . fish oil-omega-3 fatty acids 1000 MG capsule   Oral   Take 1 g by mouth daily.         . fluticasone (FLONASE) 50 MCG/ACT nasal spray   Each Nare   Place 2 sprays into both nostrils daily.   16 g   12   . Glucosamine-Vitamin D (GLUCOSAMINE PLUS VITAMIN D) 1500-200 MG-UNIT TABS   Oral   Take 2 tablets by mouth daily.         . hydrochlorothiazide (HYDRODIURIL) 25 MG tablet   Oral   Take 0.5 tablets (12.5 mg total) by mouth daily.   30 tablet   11   . Turmeric Curcumin 500 MG CAPS   Oral   Take 1 capsule by mouth 2 (two) times daily.         . vitamin B-12 (CYANOCOBALAMIN) 1000 MCG tablet   Oral   Take 1,000 mcg by mouth daily.           Allergies Review of patient's allergies indicates  no known allergies.  Family History  Problem Relation Age of Onset  . Diabetes Mother   . Heart attack      family history  . Arthritis Father   . Heart disease Paternal Grandmother     MI    Social History Social History  Substance Use Topics  . Smoking status: Former Research scientist (life sciences)  . Smokeless tobacco: Never Used  . Alcohol Use: 0.6 oz/week    1 Cans of beer per week    Review of Systems Constitutional: No fever/chills Eyes: No visual changes. ENT: No sore throat. Cardiovascular: Denies chest pain. Respiratory: Denies shortness of breath. Gastrointestinal: No abdominal pain.  No nausea, no vomiting.  No diarrhea.  No constipation. Genitourinary: Negative for dysuria. Musculoskeletal: Negative for back pain. Skin: Negative for rash. Neurological: Negative for headaches, focal weakness or numbness.  10-point ROS otherwise negative.  ____________________________________________   PHYSICAL EXAM:  VITAL SIGNS: ED Triage Vitals  Enc Vitals Group     BP 08/07/15 2104 138/86  mmHg     Pulse Rate 08/07/15 2104 70     Resp 08/07/15 2104 18     Temp 08/07/15 2104 97.7 F (36.5 C)     Temp Source 08/07/15 2104 Oral     SpO2 08/07/15 2104 98 %     Weight 08/07/15 2104 188 lb (85.276 kg)     Height 08/07/15 2104 5\' 10"  (1.778 m)     Head Cir --      Peak Flow --      Pain Score --      Pain Loc --      Pain Edu? --      Excl. in [place]? --     Constitutional: Alert and oriented. Well appearing and in no acute distress. Eyes: Conjunctivae are normal. PERRL. EOMI. Head: Atraumatic. Nose: No congestion/rhinnorhea. Mouth/Throat: Mucous membranes are moist.  Oropharynx non-erythematous. Neck: No stridor.  Cardiovascular: Normal rate, regular rhythm. Grossly normal heart sounds!  Good peripheral circulation. Respiratory: Normal respiratory effort.  No retractions. Lungs CTAB. Gastrointestinal: Soft and nontender. No distention. No abdominal bruits. No CVA tenderness. Musculoskeletal: No lower extremity tenderness nor edema.  No joint effusions. Neurologic:  Normal speech and language. No gross focal neurologic deficits are appreciated. No gait instability. Skin:  Skin is warm, dry and intact. No rash noted. Psychiatric: Mood and affect are normal. Speech and behavior are normal.  ____________________________________________   LABS (all labs ordered are listed, but only abnormal results are displayed)  Labs Reviewed  CBC - Abnormal; Notable for the following:    Platelets 125 (*)    All other components within normal limits  BASIC METABOLIC PANEL - Abnormal; Notable for the following:    Glucose, Bld 126 (*)    All other components within normal limits  URINALYSIS COMPLETEWITH MICROSCOPIC ([facility] ONLY) - Abnormal; Notable for the following:    Color, Urine YELLOW (*)    APPearance CLEAR (*)    Bacteria, UA RARE (*)    Squamous Epithelial / LPF 0-5 (*)    All other components within normal limits  TROPONIN I  URINE DRUG SCREEN, QUALITATIVE ([facility] ONLY)    COMPREHENSIVE METABOLIC PANEL  CBC WITH DIFFERENTIAL/PLATELET  TROPONIN I   ____________________________________________  EKG  EKG read and interpreted by me shows normal sinus rhythm rate of 63 essentially normal axis nonspecific ST-T wave changes ____________________________________________  RADIOLOGY  Chest x-ray read as no acute disease by radiology ____________________________________________   PROCEDURES  Dr. Owens Shark will  check the final troponin ____________________________________________   INITIAL IMPRESSION / ASSESSMENT AND PLAN / ED COURSE  Pertinent labs & imaging results that were available during my care of the patient were reviewed by me and considered in my medical decision making (see chart for details).   ____________________________________________   FINAL CLINICAL IMPRESSION(S) / ED DIAGNOSES  Final diagnoses:  Syncope, unspecified syncope type      NEW MEDICATIONS STARTED DURING THIS VISIT:  New Prescriptions   No medications on file     Note:  This document was prepared using Dragon voice recognition software and may include unintentional dictation errors.    Nena Polio, MD 08/08/15 [phone]

## 2015-08-07 NOTE — Discharge Instructions (Signed)
Syncope °Syncope is a medical term for fainting or passing out. This means you lose consciousness and drop to the ground. People are generally unconscious for less than 5 minutes. You may have some muscle twitches for up to 15 seconds before waking up and returning to normal. Syncope occurs more often in older adults, but it can happen to anyone. While most causes of syncope are not dangerous, syncope can be a sign of a serious medical problem. It is important to seek medical care.  °CAUSES  °Syncope is caused by a sudden drop in blood flow to the brain. The specific cause is often not determined. Factors that can bring on syncope include: °· Taking medicines that lower blood pressure. °· Sudden changes in posture, such as standing up quickly. °· Taking more medicine than prescribed. °· Standing in one place for too long. °· Seizure disorders. °· Dehydration and excessive exposure to heat. °· Low blood sugar (hypoglycemia). °· Straining to have a bowel movement. °· Heart disease, irregular heartbeat, or other circulatory problems. °· Fear, emotional distress, seeing blood, or severe pain. °SYMPTOMS  °Right before fainting, you may: °· Feel dizzy or light-headed. °· Feel nauseous. °· See all white or all black in your field of vision. °· Have cold, clammy skin. °DIAGNOSIS  °Your health care provider will ask about your symptoms, perform a physical exam, and perform an electrocardiogram (ECG) to record the electrical activity of your heart. Your health care provider may also perform other heart or blood tests to determine the cause of your syncope which may include: °· Transthoracic echocardiogram (TTE). During echocardiography, sound waves are used to evaluate how blood flows through your heart. °· Transesophageal echocardiogram (TEE). °· Cardiac monitoring. This allows your health care provider to monitor your heart rate and rhythm in real time. °· Holter monitor. This is a portable device that records your  heartbeat and can help diagnose heart arrhythmias. It allows your health care provider to track your heart activity for several days, if needed. °· Stress tests by exercise or by giving medicine that makes the heart beat faster. °TREATMENT  °In most cases, no treatment is needed. Depending on the cause of your syncope, your health care provider may recommend changing or stopping some of your medicines. °HOME CARE INSTRUCTIONS °· Have someone stay with you until you feel stable. °· Do not drive, use machinery, or play sports until your health care provider says it is okay. °· Keep all follow-up appointments as directed by your health care provider. °· Lie down right away if you start feeling like you might faint. Breathe deeply and steadily. Wait until all the symptoms have passed. °· Drink enough fluids to keep your urine clear or pale yellow. °· If you are taking blood pressure or heart medicine, get up slowly and take several minutes to sit and then stand. This can reduce dizziness. °SEEK IMMEDIATE MEDICAL CARE IF:  °· You have a severe headache. °· You have unusual pain in the chest, abdomen, or back. °· You are bleeding from your mouth or rectum, or you have black or tarry stool. °· You have an irregular or very fast heartbeat. °· You have pain with breathing. °· You have repeated fainting or seizure-like jerking during an episode. °· You faint when sitting or lying down. °· You have confusion. °· You have trouble walking. °· You have severe weakness. °· You have vision problems. °If you fainted, call your local emergency services (911 in [place] Do not drive   yourself to the hospital.    This information is not intended to replace advice given to you by your health care provider. Make sure you discuss any questions you have with your health care provider.   Document Released: 03/24/2005 Document Revised: 08/08/2014 Document Reviewed: 05/23/2011 Elsevier Interactive Patient Education [employer].   Please be sure to stand up slowly while you hold onto something for now. Please return if you develop any chest pain further syncope or any other problems. Please call your doctor in the morning at her know what happened. Follow-up with her as needed.

## 2015-08-07 NOTE — ED Notes (Signed)
Pt states has had episodes of dizziness after standing, states has had several episodes throughout the day.  States was diaphoretic, denies any pain or shob with symptoms.

## 2015-08-08 ENCOUNTER — Telehealth: Payer: Self-pay

## 2015-08-08 ENCOUNTER — Encounter: Payer: Self-pay | Admitting: Cardiology

## 2015-08-08 LAB — DIFFERENTIAL
Basophils Absolute: 0.1 10*3/uL (ref 0–0.1)
Basophils Relative: 1 %
EOS PCT: 4 %
Eosinophils Absolute: 0.2 10*3/uL (ref 0–0.7)
LYMPHS PCT: 34 %
Lymphs Abs: 1.5 10*3/uL (ref 1.0–3.6)
MONO ABS: 0.4 10*3/uL (ref 0.2–1.0)
MONOS PCT: 9 %
NEUTROS ABS: 2.4 10*3/uL (ref 1.4–6.5)
Neutrophils Relative %: 52 %

## 2015-08-08 LAB — TROPONIN I

## 2015-08-08 LAB — HEPATIC FUNCTION PANEL
ALK PHOS: 62 U/L (ref 38–126)
ALT: 21 U/L (ref 17–63)
AST: 29 U/L (ref 15–41)
Albumin: 4.2 g/dL (ref 3.5–5.0)
BILIRUBIN DIRECT: 0.1 mg/dL (ref 0.1–0.5)
BILIRUBIN INDIRECT: 0.7 mg/dL (ref 0.3–0.9)
Total Bilirubin: 0.8 mg/dL (ref 0.3–1.2)
Total Protein: 6.5 g/dL (ref 6.5–8.1)

## 2015-08-08 NOTE — Telephone Encounter (Signed)
Confirmed OV on 5/5 at 1100 with patient. He understands not to drive until he is evaluated. Patient was grateful for call.

## 2015-08-10 ENCOUNTER — Ambulatory Visit (INDEPENDENT_AMBULATORY_CARE_PROVIDER_SITE_OTHER): Payer: 59 | Admitting: Physician Assistant

## 2015-08-10 ENCOUNTER — Encounter: Payer: Self-pay | Admitting: Physician Assistant

## 2015-08-10 VITALS — BP 150/90 | HR 64 | Ht 70.0 in | Wt 192.6 lb

## 2015-08-10 DIAGNOSIS — R55 Syncope and collapse: Secondary | ICD-10-CM | POA: Diagnosis not present

## 2015-08-10 DIAGNOSIS — I350 Nonrheumatic aortic (valve) stenosis: Secondary | ICD-10-CM

## 2015-08-10 NOTE — Patient Instructions (Signed)
Medication Instructions:  Your physician has recommended you make the following change in your medication:  1.  HOLD your Hydrochlorothiazide ONLY RESTART IF YOUR SYSTOLIC BLOOD PRESSURE IS GREATER THAN 140   Labwork: NONE ORDERED  Testing/Procedures: NONE ORDERED  Follow-Up: Your physician recommends that you schedule a follow-up appointment in: [place] TO DISCUSS FOLLOW UP AT THAT TIME   Any Other Special Instructions Will Be Listed Below (If Applicable).     If you need a refill on your cardiac medications before your next appointment, please call your pharmacy.

## 2015-08-10 NOTE — Progress Notes (Signed)
Cardiology Office Note   Date:  08/10/2015   ID:  Todd, Frank Dec 29, 1955, MRN [id number]  PCP:  Kathrine Haddock, NP  Cardiologist:  Dr Alanson Puls, Suanne Marker, PA-C   No chief complaint on file.   History of Present Illness: Todd Frank is a 60 y.o. male with a history of HTN, GERD, HLD, bradycardia, PFO & mod AS w/ bicuspid AoV and mildly dilated asc Ao by echo 07/09/2015.   Seen by Dr Radford Pax 04/17 and doing well, f/u testing planned, BP/lipid control recommended. One of the things that was recommended was to drink a half glass of red wine every night with dinner. He is trying to eat much healthier, and has lost weight.  Seen in [facility] ER 05/02 for presyncope, orthostatic in nature.   Todd Frank presents for evaluation of near-syncope.  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 He has felt his heart rate going faster than usual, but has not thought it was irregular. He has not been extremely tachycardic but his heart rate has been elevated in the setting of tense work situations. This had been going on for 3 days prior to his presyncope. He feels he had not had as much water as he would normally drink in the afternoon. He stood up quickly, got lightheaded and was able to stand still with the symptoms resolving in a few seconds.  He ate dinner that night and states the meal was larger than he would normally eat. He felt stuffed afterwards. Several hours after the meal, he had near syncope. He stood up, felt himself getting dizzy, but was unable to remain standing. He does not feel like he ever completely lost consciousness. Once he laid down, he felt much more alert. He was not able to get up immediately, but gradually was able to get up and move around without difficulty. He did not have recurrence of his symptoms. He drank water and felt much better. Because of concerns about his valve, he went to the emergency room.  He has not had  symptoms like that since then. His blood pressure was checked by his wife at the time of the incident and shows a systolic blood pressure between 117 and 129 with a heart rate in the 60s. He is making sure to stay hydrated and has not eaten any large meals.  He notes that his blood pressure is elevated today but has a list of his home blood pressures which show a systolic blood pressure between 95 and 140. The systolic blood pressures of 95 were after he had been meditating which he has been doing recently to relieve stress. He was asymptomatic with that.   Past Medical History  Diagnosis Date  . Obesity   . HTN (hypertension)   . GERD (gastroesophageal reflux disease)   . Melanoma of skin ([place])   . LVH (left ventricular hypertrophy)   . Melanoma ([place])   . Congenital nevus   . Diastolic dysfunction   . Aortic stenosis, mild     mild to moderate AS by echo 07/2012  . Dilated aortic root ([place])   . PFO (patent foramen ovale)     small by echo 2014  . Hypercholesteremia   . Hyperlipidemia   . Bradycardia 07/23/2015    Past Surgical History  Procedure Laterality Date  . Tonsillectomy    . Eye surgery    . Melanoma excision  2011    left thigh    Current Outpatient Prescriptions  Medication Sig Dispense Refill  . aspirin 81 MG tablet Take 81 mg by mouth daily.    Marland Kitchen FEVERFEW PO Take 1 capsule by mouth daily.    . fish oil-omega-3 fatty acids 1000 MG capsule Take 1 g by mouth daily.    . fluticasone (FLONASE) 50 MCG/ACT nasal spray Place 2 sprays into both nostrils daily. 16 g 12  . Glucosamine-Vitamin D (GLUCOSAMINE PLUS VITAMIN D) 1500-200 MG-UNIT TABS Take 2 tablets by mouth daily.    . Turmeric Curcumin 500 MG CAPS Take 1 capsule by mouth 2 (two) times daily.    . vitamin B-12 (CYANOCOBALAMIN) 1000 MCG tablet Take 1,000 mcg by mouth daily.    . hydrochlorothiazide (HYDRODIURIL) 25 MG tablet Take 0.5 tablets (12.5 mg total) by mouth daily. (Patient not taking: Reported on 08/10/2015)  30 tablet 11   No current facility-administered medications for this visit.    Allergies:   Review of patient's allergies indicates no known allergies.    Social History:  The patient  reports that he has quit smoking. He has never used smokeless tobacco. He reports that he drinks about 0.6 oz of alcohol per week. He reports that he does not use illicit drugs.   Family History:  The patient's family history includes Arthritis in his father; Diabetes in his mother; Heart disease in his paternal grandmother.    ROS:  Please see the history of present illness. All other systems are reviewed and negative.    PHYSICAL EXAM: VS:  BP 150/90 mmHg  Pulse 64  Ht 5\' 10"  (1.778 m)  Wt 192 lb 9.6 oz (87.363 kg)  BMI 27.64 kg/m2 , BMI Body mass index is 27.64 kg/(m^2). GEN: Well nourished, well developed, male in no acute distress HEENT: normal for age  Neck: no JVD, no carotid bruit, no masses Cardiac: RRR; S2 heard, 2/6 murmur, no rubs, or gallops Respiratory:  clear to auscultation bilaterally, normal work of breathing GI: soft, nontender, nondistended, + BS MS: no deformity or atrophy; no edema; distal pulses are 2+ in all 4 extremities  Skin: warm and dry, no rash Neuro:  Strength and sensation are intact Psych: euthymic mood, full affect   EKG:  EKG is not ordered today. ECG from [facility] on 08/07/2015 shows sinus rhythm with inferior Q waves, no significant change from 08/22/2014  ECHO 07/09/2015 - Left ventricle: The cavity size was normal. Wall thickness was  normal. Systolic function was normal. The estimated ejection  fraction was in the range of 55% to 60%. Wall motion was normal;  there were no regional wall motion abnormalities. Doppler  parameters are consistent with abnormal left ventricular  relaxation (grade 1 diastolic dysfunction). - Aortic valve: Right coronary cusp mobility was severely  restricted. There was moderate stenosis. There was trivial   regurgitation. Mean gradient (S): 26 mm Hg. Peak gradient (S): 42 mm Hg. - Left atrium: The atrium was mildly dilated. - Atrial septum: No defect or patent foramen ovale was identified.  Recent Labs: 02/02/2015: TSH 1.580 08/07/2015: ALT 21; BUN 20; Creatinine, Ser 0.90; Hemoglobin 15.1; Platelets 125*; Potassium 3.6; Sodium 140    Lipid Panel    Component Value Date/Time   CHOL 201* 07/24/2015 0823   TRIG 117 07/24/2015 0823   HDL 49 07/24/2015 0823   LDLCALC 129* 07/24/2015 0823     Wt Readings from Last 3 Encounters:  08/10/15 192 lb 9.6 oz (87.363 kg)  08/07/15 188 lb (85.276 kg)  07/23/15 198 lb 1.9 oz (89.867 kg)     Other studies Reviewed: Additional studies/ records that were reviewed today include: Office notes, hospital records and testing.  ASSESSMENT AND PLAN:  1. Presyncope: I reviewed the situation with the patient. I described how his aortic stenosis made it difficult for his heart to keep up with increased demands from physical stress or after large meals. He was also taking a diuretic, which he has discontinued. I advised that he should keep his exertion level down to a 4/10, but keep going to the gym. I advised that if he develops further episodes of presyncope, he needed to contact us because we would need to move testing up. I advised that he needed to stay well hydrated. Because of the need to maintain preload, we will DC the HCTZ unless his systolic blood pressure at home is greater than 140.  I feel the presyncope/near syncope was most likely secondary to supply/demand mismatch, which the patient states he can avoid. No driving restrictions.  2. Aortic stenosis: See echo results above, CT and repeat echocardiogram per [name]   Current medicines are reviewed at length with the patient today.  The patient has concerns regarding medicines. Concerns were addressed  The following changes have been made:  DC HCTZ  Labs/ tests ordered today include:   No  orders of the defined types were placed in this encounter.     Disposition:   FU with [name]  Signed, Rosaria Ferries, PA-C  08/10/2015 4:54 PM    [place] Phone: [phone]; Fax: [phone]  This note was written with the assistance of speech recognition software. Please excuse any transcriptional errors.

## 2015-09-07 ENCOUNTER — Other Ambulatory Visit: Payer: Self-pay | Admitting: Cardiology

## 2015-09-08 LAB — LIPID PANEL W/O CHOL/HDL RATIO
CHOLESTEROL TOTAL: 185 mg/dL (ref 100–199)
HDL: 52 mg/dL (ref >39)
LDL Calculated: 119 mg/dL — ABNORMAL HIGH (ref 0–99)
TRIGLYCERIDES: 70 mg/dL (ref 0–149)
VLDL Cholesterol Cal: 14 mg/dL (ref 5–40)

## 2015-09-08 LAB — ALT: ALT: 15 IU/L (ref 0–44)

## 2015-11-02 ENCOUNTER — Encounter: Payer: Self-pay | Admitting: Family Medicine

## 2015-11-02 LAB — LIPID PANEL
CHOL/HDL RATIO: 3.1 (ref 0.0–5.0)
Cholesterol, Total: 173 (ref 100–199)
HDL: 55 (ref 39–?)
LDL CALC: 104 — AB (ref 0–99)
Triglycerides: 69 (ref 0–149)
VLDL: 14 mg/dL (ref 5–40)

## 2015-11-02 LAB — HEMOGLOBIN A1C: A1c: 5 (ref 4.8–5.6)

## 2015-11-02 LAB — CREATININE WITH EST GFR
Creatinine, Ser: 0.86 (ref 0.76–1.27)
EGFR (Non-African Amer.): 94 (ref >59)

## 2015-11-02 LAB — GLUCOSE, FASTING: GLUCOSE FASTING: 85 (ref 65–99)

## 2016-01-02 ENCOUNTER — Other Ambulatory Visit: Payer: Self-pay | Admitting: Orthopedic Surgery

## 2016-01-02 ENCOUNTER — Other Ambulatory Visit (HOSPITAL_COMMUNITY): Payer: Self-pay | Admitting: Orthopedic Surgery

## 2016-01-02 DIAGNOSIS — M5410 Radiculopathy, site unspecified: Secondary | ICD-10-CM

## 2016-01-09 ENCOUNTER — Ambulatory Visit (INDEPENDENT_AMBULATORY_CARE_PROVIDER_SITE_OTHER): Payer: 59 | Admitting: Family Medicine

## 2016-01-09 ENCOUNTER — Encounter: Payer: Self-pay | Admitting: Family Medicine

## 2016-01-09 VITALS — BP 132/82 | HR 62 | Temp 98.3°F | Resp 16 | Ht 68.2 in | Wt 179.0 lb

## 2016-01-09 DIAGNOSIS — I10 Essential (primary) hypertension: Secondary | ICD-10-CM

## 2016-01-09 DIAGNOSIS — I517 Cardiomegaly: Secondary | ICD-10-CM | POA: Diagnosis not present

## 2016-01-09 DIAGNOSIS — Z7689 Persons encountering health services in other specified circumstances: Secondary | ICD-10-CM | POA: Diagnosis not present

## 2016-01-09 DIAGNOSIS — E7800 Pure hypercholesterolemia, unspecified: Secondary | ICD-10-CM

## 2016-01-09 DIAGNOSIS — I350 Nonrheumatic aortic (valve) stenosis: Secondary | ICD-10-CM

## 2016-01-09 DIAGNOSIS — Z85820 Personal history of malignant melanoma of skin: Secondary | ICD-10-CM | POA: Diagnosis not present

## 2016-01-09 DIAGNOSIS — I77810 Thoracic aortic ectasia: Secondary | ICD-10-CM | POA: Diagnosis not present

## 2016-01-09 DIAGNOSIS — Z1211 Encounter for screening for malignant neoplasm of colon: Secondary | ICD-10-CM | POA: Diagnosis not present

## 2016-01-09 DIAGNOSIS — Z9889 Other specified postprocedural states: Secondary | ICD-10-CM

## 2016-01-09 DIAGNOSIS — G5701 Lesion of sciatic nerve, right lower limb: Secondary | ICD-10-CM | POA: Diagnosis not present

## 2016-01-09 DIAGNOSIS — Z23 Encounter for immunization: Secondary | ICD-10-CM

## 2016-01-09 NOTE — Assessment & Plan Note (Signed)
Chronic problem, with intermittent worsening. Had improved previously, suspect related to R LBP, and associated R calf pain with some sciatica. History not consistent with neurogenic claudication. No red flag LBP symptoms. No radicular symptoms reproduced today. - Recent x-rays reviewed 2015, most recent 11/2015 not available to me.  Plan: 1. Continue current exercise / stretching regimen 2. Follow-up with Dr Lynann Bologna [facility]) as planned, for pending Lumbar MRI 01/12/16

## 2016-01-09 NOTE — Assessment & Plan Note (Signed)
Resolved. S/p surgical resection >4-5 years ago Left upper thigh / groin.  Followed by Dr Nehemiah Massed ([place]) q 6 mo for whole body skin checks

## 2016-01-09 NOTE — Assessment & Plan Note (Signed)
Stable, well controlled on lifestyle. No anti-HTN meds Initial SBP around 140, improved on manual re-check. Consistent with home BP checks  Plan: 1. Monitor at home, return sooner if elevated 2. Follow-up Cardiology, given concerns with AS

## 2016-01-09 NOTE — Assessment & Plan Note (Signed)
Stable, chronic problem. Currently well controlled on lifestyle without statin medication. Last lipids reviewed 11/02/15 with HDL 55 and LDL 104, improved from 09/2015.  Plan: 1. Initially ordered future fasting lipid panel per patient request, advised patient would consider re-check in 6-12 months 2. Declines statin therapy at this time 3. Continue supplements, Fish Oil

## 2016-01-09 NOTE — Progress Notes (Signed)
Subjective:    Patient ID: Todd Frank, male    DOB: Feb 09, 1956, 60 y.o.   MRN: [id number]  Todd Frank is a 60 y.o. male presenting on 01/09/2016 for Establish Care  Previously established at [place].  HPI  Chronic Low Back / Leg Pain with R-Sciatica, Piriformis Syndrome: - Chronic problem over past 1-2 years, has been established with various providers including Dr Hulan Saas (Sports Med, [facility]), Dr Noel Gerold (Chiropractor in [place]), and most recently Orthopedics (Dr Lynann Bologna, [facility]). Briefly, patient was initially diagnosed with R-IT Band syndrome, and thought some degenerative meniscus problems, overall improved with exercises and PT, additionally with Piriformis syndrome, has had injection before with improvement. Then followed by chiropractor with improved ROM and motion, more functional, however due to persistent recurrent problems with R low back pain and R calf pain, was sent to Ortho, last saw recently 1 week ago, had Lumbar X-rays and Hip X-rays (not available for review), similar to last x-rays done 2015 with some lumbar DJD, states recent x-rays with some concern of lower lumbar stenosis, scheduled for Lumbar MRI 01/12/16. - Currently he is without active pain, but describes pain mostly in Right calf and low back. Feels a tightening of muscles usually worse in morning or before is active, improved with daily home exercises and stretches, occasionally he has a night time pain - Stays active, regular exercise at [facility] 6 days weekly (some light resistance training and aerobic), and weekend walking  H/o Chronoic Sinusitis / Lightheadedness: - Reports prior history of chronic problem, previously thought to be sinus related, intermittent for few years. Treated with Flonase, still using, unsure if significant improvement, however symptoms are less frequent. He was established with [facility]. Hearing tests showed bilateral hearing loss (on test only, not  affecting causal hearing) and some tinnitus.  CHRONIC HTN: Reports prior history of worse BP, with weight up to 260 lbs, significant improvement with weight loss - Monitors it at home with arm cuff (has calibrated with cardiology) 120s/70-80s usually stable. Current Meds - None previously on HCTZ    Mild-Mod Aortic Stenosis (Biscuspid AoV), Dilated Aortic Root - Followed by [facility] (Dr [name]), last seen 08/2015, did have recent hospitalization in 08/2015 for pre-syncopal episode thought to be orthostasis vs related to aortic stenosis. He was taken off HCTZ, given exertion precautions. Scheduled for Chest CT Aortic evaluation and ECHO in 01/2016. - Currently without any active chest pain, exertional pain or dyspnea, further pre-syncope or syncopal episodes  HYPERLIPIDEMIA: - Followed by Cardiology, has been advised to take statin with atorvastatin to goal LDL < 70, patient declined this recently and not interested in starting statin. He has been on one years ago. Recent lipids in 09/2015 controlled except elevated LDL 119, patient has new labs 10/2015 for health screening, with improved HDL and lower LDL to 104.  Health Maintenance: - Last colonoscopy 11/16/2006 by Dr. Earle Gell at [facility], reported normal and due again in 10 years, approx 2018, interested in cologuard. - No family history of prostate. Last PSA 1.0 (01/2015), reports has had numerous normal DRE. Denies any prostate symptoms. - Due flu shot today, will get   Past Medical History:  Diagnosis Date  . Aortic stenosis, mild    mild to moderate AS by echo 07/2012  . Bradycardia 07/23/2015  . Congenital nevus   . Diastolic dysfunction   . Dilated aortic root (Todd Frank)   . GERD (gastroesophageal reflux disease)   . HTN (  hypertension)   . Hypercholesteremia   . Hyperlipidemia   . LVH (left ventricular hypertrophy)   . Melanoma ([place])   . Melanoma of skin ([place])   . Obesity   . PFO (patent foramen ovale)    small by  echo 2014   Social History   Social History  . Marital status: Married    Spouse name: N/A  . Number of children: N/A  . Years of education: N/A   Occupational History  . Government social research officer - [employer]    Social History Main Topics  . Smoking status: Former Research scientist (life sciences)  . Smokeless tobacco: Never Used  . Alcohol use 0.6 oz/week    1 Cans of beer per week  . Drug use: No  . Sexual activity: Yes   Other Topics Concern  . Not on file   Social History Narrative  . No narrative on file   Family History  Problem Relation Age of Onset  . Diabetes Mother   . Arthritis Father   . Heart disease Paternal Grandmother     MI  . Heart attack      family history   Current Outpatient Prescriptions on File Prior to Visit  Medication Sig  . aspirin 81 MG tablet Take 81 mg by mouth daily.  Marland Kitchen FEVERFEW PO Take 1 capsule by mouth daily.  . fish oil-omega-3 fatty acids 1000 MG capsule Take 1 g by mouth daily.  . fluticasone (FLONASE) 50 MCG/ACT nasal spray Place 2 sprays into both nostrils daily.  . Glucosamine-Vitamin D (GLUCOSAMINE PLUS VITAMIN D) 1500-200 MG-UNIT TABS Take 2 tablets by mouth daily.  . Turmeric Curcumin 500 MG CAPS Take 1 capsule by mouth 2 (two) times daily.  . vitamin B-12 (CYANOCOBALAMIN) 1000 MCG tablet Take 1,000 mcg by mouth daily.   No current facility-administered medications on file prior to visit.     Review of Systems  Constitutional: Negative for activity change, appetite change, chills, diaphoresis, fatigue, fever and unexpected weight change.  HENT: Negative for congestion, hearing loss and sinus pressure.   Eyes: Negative for visual disturbance.  Respiratory: Negative for cough, chest tightness, shortness of breath and wheezing.   Cardiovascular: Negative for chest pain, palpitations and leg swelling.  Gastrointestinal: Negative for abdominal pain, constipation, diarrhea, nausea and vomiting.  Endocrine: Negative for cold intolerance and polyuria.    Genitourinary: Negative for difficulty urinating, dysuria, frequency, hematuria and testicular pain.  Musculoskeletal: Positive for back pain. Negative for arthralgias, gait problem and neck pain.  Skin: Negative for rash.  Allergic/Immunologic: Negative for environmental allergies.  Neurological: Negative for dizziness, syncope, weakness, light-headedness, numbness and headaches.  Hematological: Negative for adenopathy.  Psychiatric/Behavioral: Negative for behavioral problems, confusion, decreased concentration, dysphoric mood and sleep disturbance. The patient is not nervous/anxious.    Per HPI unless specifically indicated above     Objective:    BP 132/82 (BP Location: Left Arm, Cuff Size: Normal)   Pulse 62   Temp 98.3 F (36.8 C) (Oral)   Resp 16   Ht 5' 8.2" (1.732 m)   Wt 179 lb (81.2 kg)   BMI 27.06 kg/m   Wt Readings from Last 3 Encounters:  01/09/16 179 lb (81.2 kg)  08/10/15 192 lb 9.6 oz (87.4 kg)  08/07/15 188 lb (85.3 kg)    Physical Exam  Constitutional: He is oriented to person, place, and time. He appears well-developed and well-nourished. No distress.  Well-appearing, comfortable, cooperative  HENT:  Head: Normocephalic and atraumatic.  Mouth/Throat: Oropharynx is  clear and moist.  Eyes: Conjunctivae and EOM are normal. Pupils are equal, round, and reactive to light.  Neck: Normal range of motion. Neck supple. No thyromegaly present.  Cardiovascular: Normal rate, regular rhythm and intact distal pulses.   Murmur (2/6 systolic murmur over left sternal border, no significant radiation to carotids) heard. Pulmonary/Chest: Effort normal and breath sounds normal. No respiratory distress. He has no wheezes. He has no rales.  Abdominal: Soft. Bowel sounds are normal. He exhibits no distension and no mass. There is no tenderness.  Musculoskeletal: Normal range of motion. He exhibits no edema or tenderness.  Low Back Inspection: Normal appearance, no spinal  deformity, symmetrical. Palpation: No tenderness over spinous processes. Very minimal discomfort over Right lower lumbosacral region. Bilateral lumbar paraspinal muscles non-tender and without hypertonicity/spasm. ROM: Full active ROM forward flex / back extension, rotation L/R without discomfort Special Testing: Seated SLR negative for radicular pain bilaterally Strength: Bilateral hip flex/ext 5/5, knee flex/ext 5/5, ankle dorsiflex/plantarflex 5/5  Lymphadenopathy:    He has no cervical adenopathy.  Neurological: He is alert and oriented to person, place, and time.  Gait normal Distal sensation to light touch intact.  Right hand with intact sensation all digits including thumb and 1st 2 digits. Negative Tinel's Right wrist.  Skin: Skin is warm and dry. No rash noted. He is not diaphoretic.  Psychiatric: He has a normal mood and affect. His behavior is normal.  Nursing note and vitals reviewed.   Reviewed images and radiology report on last available x-rays 11/2013  Lumbar Spine X-ray FINDINGS: Alignment is anatomic. Vertebral body and disc space height are maintained. Mild endplate degenerative changes at L4-5 with bridging osteophytes anteriorly in the lower thoracic and upper lumbar spine. Facet hypertrophy in the lower lumbar spine. No definite pars defects. IMPRESSION: Scattered spondylosis.  No acute findings.  RIght Hip Complete X-ray FINDINGS: Pelvic bones intact. Very mild narrowing of the right hip joint with minimal bilateral osteophyte formation. Minimal left and mild right sacroiliac sclerosis. Mild iliac crest enthesopathy. Mild pubic symphysitis.  IMPRESSION: No acute findings.  Mild degenerative changes.   I have personally reviewed last lab results in from 08/2015 to 09/2015.  Comprehensive Metabolic Panel:    Component Value Date/Time   NA 140 08/07/2015 2115   NA 138 07/24/2015 0823   K 3.6 08/07/2015 2115   CL 106 08/07/2015 2115   CO2 23 08/07/2015  2115   BUN 20 08/07/2015 2115   BUN 12 07/24/2015 0823   CREATININE 0.86 11/02/2015   GLUCOSE 126 (H) 08/07/2015 2115   CALCIUM 9.0 08/07/2015 2115   AST 29 08/07/2015 2115   ALT 15 09/07/2015 0829   ALKPHOS 62 08/07/2015 2115   BILITOT 0.8 08/07/2015 2115   BILITOT 0.9 07/24/2015 0823   PROT 6.5 08/07/2015 2115   PROT 6.8 07/24/2015 0823   ALBUMIN 4.2 08/07/2015 2115   ALBUMIN 4.8 07/24/2015 0823    Results for orders placed or performed in visit on 11/02/15  Glucose, fasting  Result Value Ref Range   Glucose Fasting 85 65 - 99      Assessment & Plan:   Problem List Items Addressed This Visit    Piriformis syndrome of right side    Chronic problem, with intermittent worsening. Had improved previously, suspect related to R LBP, and associated R calf pain with some sciatica. History not consistent with neurogenic claudication. No red flag LBP symptoms. No radicular symptoms reproduced today. - Recent x-rays reviewed 2015, most recent 11/2015  not available to me.  Plan: 1. Continue current exercise / stretching regimen 2. Follow-up with Dr Lynann Bologna [facility]) as planned, for pending Lumbar MRI 01/12/16      Melanoma of skin ([place])    Resolved. S/p surgical resection >4-5 years ago Left upper thigh / groin.  Followed by Dr Nehemiah Massed ([place]) q 6 mo for whole body skin checks      LVH (left ventricular hypertrophy)   Hypercholesteremia    Stable, chronic problem. Currently well controlled on lifestyle without statin medication. Last lipids reviewed 11/02/15 with HDL 55 and LDL 104, improved from 09/2015.  Plan: 1. Initially ordered future fasting lipid panel per patient request, advised patient would consider re-check in 6-12 months 2. Declines statin therapy at this time 3. Continue supplements, Fish Oil      Relevant Orders   Lipid panel   HTN (hypertension)    Stable, well controlled on lifestyle. No anti-HTN meds Initial SBP around 140, improved  on manual re-check. Consistent with home BP checks  Plan: 1. Monitor at home, return sooner if elevated 2. Follow-up Cardiology, given concerns with AS      Relevant Orders   Lipid panel   Dilated aortic root ([place])    Currently stable without symptoms, chronic problem. Recent concern with hospitalization for pre-syncope. Followed by [facility] Cardiology (Dr Radford Pax), with future orders for Chest CT angio aorta and ECHO. No changes today      Colon cancer screening    Would be due in 1 year for colon cancer screening. Last neg colonoscopy 2008.  - Discussion today about recommendations for either Colonoscopy or Cologuard screening, benefits and risks of screening, interested in Cologuard, understands that if positive then recommendation is for diagnostic colonoscopy to follow-up. - Patient advised to contact insurance first to learn cost, will notify us when ready for [place] to order Cologuard in future      Aortic stenosis, mild    Currently stable without symptoms, chronic problem. Recent concern with hospitalization for pre-syncope. Followed by [facility]iology (Dr Radford Pax), with future orders for Chest CT angio aorta and ECHO. No changes today       Other Visit Diagnoses    Encounter to establish care with new doctor    -  Primary   Need for influenza vaccination       Relevant Orders   Flu Vaccine QUAD 36+ mos PF IM (Fluarix & Fluzone Quad PF) (Completed)      No orders of the defined types were placed in this encounter.     Follow up plan: Return in about 4 weeks (around 02/06/2016) for annual physical, fasting labs.  Nobie Putnam, DO [facility] 01/09/2016, 6:15 PM

## 2016-01-09 NOTE — Assessment & Plan Note (Signed)
Currently stable without symptoms, chronic problem. Recent concern with hospitalization for pre-syncope. Followed by [facility] Cardiology (Dr Radford Pax), with future orders for Chest CT angio aorta and ECHO. No changes today

## 2016-01-09 NOTE — Assessment & Plan Note (Signed)
Would be due in 1 year for colon cancer screening. Last neg colonoscopy 2008.  - Discussion today about recommendations for either Colonoscopy or Cologuard screening, benefits and risks of screening, interested in Cologuard, understands that if positive then recommendation is for diagnostic colonoscopy to follow-up. - Patient advised to contact insurance first to learn cost, will notify us when ready for [place] to order Cologuard in future

## 2016-01-09 NOTE — Patient Instructions (Addendum)
Thank you for coming in to clinic today.  1. If you can request your records from Orthopedics and the MRI results once available, and have them sent to [place] as well I would appreciate it. Will follow-up on your progress.  2. Continue to follow-up with Cardiology as planned.  BP is stable to improved today. Keep checking at home.  You can take the Lab Slip to [facility] to avoid a venipuncture fee, and this is probably easier for our office as well. - get fasting lab drawn for lipid panel 1-2 weeks before next appointment  For Lab Results, once available within next 2-3 days, you can log in to MyChart to view your results and a brief message with explanations.And we can discuss results at your next follow-up visit.  Follow-up as scheduled at end of month for annual physical.  Colon Cancer Screening: - For all adults age 74+ routine colon cancer screening is highly recommended. - Early detection of colon cancer is important, because often there are no warning signs or symptoms, also if found early usually it can be cured. Late stage is hard to treat.  - If you are not interested in Colonoscopy screening (if done and normal you could be cleared for 5 to 10 years until next due), then Cologuard is an excellent alternative for screening test for Colon Cancer. It is highly sensitive for detecting DNA of colon cancer from even the earliest stages. Also, there is NO bowel prep required. - If Cologuard is NEGATIVE, then it is good for 3 years before next due - If Cologuard is POSITIVE, then it is strongly advised to get a Colonoscopy, which allows the GI doctor to locate the source of the cancer or polyp (even very early stage) and treat it by removing it.  If you would like to proceed with Cologuard (stool DNA test) - FIRST, call your insurance company and tell them you want to check cost of Cologuard tell them CPT Code [phone] (it may be completely covered and you could get for no cost, OR max cost without  any coverage is about $600). Also, keep in mind if you do NOT open the kit, and decide not to do the test, you will NOT be charged, you should contact the company if you decide not to do the test. - If you want to proceed, you can notify us (phone message, [place], or at next visit) and we will order it for you. The test kit will be delivered to you house within about 1 week. Follow instructions to collect sample, you may call the company for any help or questions, 24/7 telephone support at [phone].  If you have any other questions or concerns, please feel free to call the clinic or send a message through [place]. You may also schedule an earlier appointment if necessary.  Nobie Putnam, DO [place]

## 2016-01-09 NOTE — Assessment & Plan Note (Signed)
Currently stable without symptoms, chronic problem. Recent concern with hospitalization for pre-syncope. Followed by [facility] Cardiology (Dr Radford Pax), with future orders for Chest CT angio aorta and ECHO. No changes today

## 2016-01-12 ENCOUNTER — Ambulatory Visit
Admission: RE | Admit: 2016-01-12 | Discharge: 2016-01-12 | Disposition: A | Discharge status: Home / Self Care | Payer: 59 | Source: Ambulatory Visit | Attending: Orthopedic Surgery | Admitting: Orthopedic Surgery

## 2016-01-12 DIAGNOSIS — M5441 Lumbago with sciatica, right side: Secondary | ICD-10-CM | POA: Diagnosis present

## 2016-01-12 DIAGNOSIS — M5410 Radiculopathy, site unspecified: Secondary | ICD-10-CM

## 2016-01-12 DIAGNOSIS — M5126 Other intervertebral disc displacement, lumbar region: Secondary | ICD-10-CM | POA: Insufficient documentation

## 2016-01-12 DIAGNOSIS — M48061 Spinal stenosis, lumbar region without neurogenic claudication: Secondary | ICD-10-CM | POA: Diagnosis not present

## 2016-01-12 DIAGNOSIS — M5136 Other intervertebral disc degeneration, lumbar region: Secondary | ICD-10-CM | POA: Insufficient documentation

## 2016-01-12 DIAGNOSIS — M7138 Other bursal cyst, other site: Secondary | ICD-10-CM | POA: Insufficient documentation

## 2016-01-14 ENCOUNTER — Other Ambulatory Visit: Payer: 59 | Admitting: *Deleted

## 2016-01-14 ENCOUNTER — Other Ambulatory Visit: Payer: Self-pay | Admitting: Cardiology

## 2016-01-14 DIAGNOSIS — I10 Essential (primary) hypertension: Secondary | ICD-10-CM

## 2016-01-14 LAB — BASIC METABOLIC PANEL
BUN: 13 mg/dL (ref 7–25)
CHLORIDE: 103 mmol/L (ref 98–110)
CO2: 29 mmol/L (ref 20–31)
CREATININE: 0.84 mg/dL (ref 0.70–1.25)
Calcium: 9.3 mg/dL (ref 8.6–10.3)
Glucose, Bld: 73 mg/dL (ref 65–99)
POTASSIUM: 4.1 mmol/L (ref 3.5–5.3)
SODIUM: 140 mmol/L (ref 135–146)

## 2016-01-14 NOTE — Progress Notes (Signed)
Order entered per [facility] in [place].

## 2016-01-15 ENCOUNTER — Ambulatory Visit (INDEPENDENT_AMBULATORY_CARE_PROVIDER_SITE_OTHER)
Admission: RE | Admit: 2016-01-15 | Discharge: 2016-01-15 | Disposition: A | Discharge status: Home / Self Care | Payer: 59 | Source: Ambulatory Visit | Attending: Cardiology | Admitting: Cardiology

## 2016-01-15 DIAGNOSIS — I77810 Thoracic aortic ectasia: Secondary | ICD-10-CM | POA: Diagnosis not present

## 2016-01-15 MED ORDER — IOPAMIDOL (ISOVUE-370) INJECTION 76%
100.0000 mL | Freq: Once | INTRAVENOUS | Status: AC | PRN
  Administered 2016-01-15: 100 mL via INTRAVENOUS

## 2016-01-18 ENCOUNTER — Telehealth: Payer: Self-pay

## 2016-01-18 DIAGNOSIS — I350 Nonrheumatic aortic (valve) stenosis: Secondary | ICD-10-CM

## 2016-01-18 DIAGNOSIS — R9389 Abnormal findings on diagnostic imaging of other specified body structures: Secondary | ICD-10-CM

## 2016-01-18 NOTE — Telephone Encounter (Signed)
Informed patient of results and verbal understanding expressed.  Stress myoview and ECHO ordered for scheduling. Patient understands not to eat for 2 hours prior to test, to avoid caffeine for 12 hours, to takes meds as directed, and to dress prepared to be on a treadmill with tennis shoes. Patient agrees with treatment plan.  Patient states he has not had any more pre-syncopal events. He states he occasionally gets lightheaded, but only when he knows he gets up too fast from sitting.

## 2016-01-18 NOTE — Telephone Encounter (Signed)
-----   Message from [name], MD sent at 01/16/2016  2:11 PM EDT ----- Please find out if patient has had any further presyncopal episodes.  I would like him to have a stress myoview to rule out ischemia given his coronary artery calcifications on chest CT as well as recent presyncope and known AS.  We can assess his BP response to exercise in setting of moderate AS and exercise tolerance. Also please repeat echo to assess for progression of AS

## 2016-01-23 ENCOUNTER — Encounter: Payer: Self-pay | Admitting: Cardiology

## 2016-01-29 ENCOUNTER — Telehealth (HOSPITAL_COMMUNITY): Payer: Self-pay | Admitting: *Deleted

## 2016-01-29 ENCOUNTER — Other Ambulatory Visit: Payer: Self-pay | Admitting: Family Medicine

## 2016-01-29 NOTE — Telephone Encounter (Signed)
Patient given detailed instructions per Myocardial Perfusion Study Information Sheet for the test on 01/31/16 at 0715. Patient notified to arrive 15 minutes early and that it is imperative to arrive on time for appointment to keep from having the test rescheduled.  If you need to cancel or reschedule your appointment, please call the office within 24 hours of your appointment. Failure to do so may result in a cancellation of your appointment, and a $50 no show fee. Patient verbalized understanding.Nikita Surman, [place]

## 2016-01-30 LAB — LIPID PANEL W/O CHOL/HDL RATIO
CHOLESTEROL TOTAL: 174 mg/dL (ref 100–199)
HDL: 60 mg/dL (ref >39)
LDL CALC: 101 mg/dL — AB (ref 0–99)
TRIGLYCERIDES: 64 mg/dL (ref 0–149)
VLDL Cholesterol Cal: 13 mg/dL (ref 5–40)

## 2016-01-31 ENCOUNTER — Ambulatory Visit (HOSPITAL_COMMUNITY): Payer: 59 | Attending: Cardiology

## 2016-01-31 DIAGNOSIS — R9389 Abnormal findings on diagnostic imaging of other specified body structures: Secondary | ICD-10-CM

## 2016-01-31 DIAGNOSIS — R938 Abnormal findings on diagnostic imaging of other specified body structures: Secondary | ICD-10-CM | POA: Insufficient documentation

## 2016-01-31 LAB — MYOCARDIAL PERFUSION IMAGING
CHL CUP NUCLEAR SDS: 0
CHL CUP NUCLEAR SRS: 5
CSEPHR: 103 %
CSEPPHR: 164 {beats}/min
Estimated workload: 17.2 METS
Exercise duration (min): 14 min
Exercise duration (sec): 0 s
LV dias vol: 167 mL (ref 62–150)
LVSYSVOL: 79 mL
MPHR: 159 {beats}/min
RATE: 0.24
Rest HR: 50 {beats}/min
SSS: 5
TID: 0.91

## 2016-01-31 MED ORDER — TECHNETIUM TC 99M TETROFOSMIN IV KIT
31.9000 | PACK | Freq: Once | INTRAVENOUS | Status: AC | PRN
  Administered 2016-01-31: 31.9 via INTRAVENOUS
  Filled 2016-01-31: qty 32

## 2016-01-31 MED ORDER — TECHNETIUM TC 99M TETROFOSMIN IV KIT
10.9000 | PACK | Freq: Once | INTRAVENOUS | Status: AC | PRN
  Administered 2016-01-31: 10.9 via INTRAVENOUS
  Filled 2016-01-31: qty 11

## 2016-02-04 ENCOUNTER — Telehealth: Payer: Self-pay

## 2016-02-04 ENCOUNTER — Encounter: Payer: Self-pay | Admitting: Family Medicine

## 2016-02-04 ENCOUNTER — Ambulatory Visit (INDEPENDENT_AMBULATORY_CARE_PROVIDER_SITE_OTHER): Payer: 59 | Admitting: Family Medicine

## 2016-02-04 VITALS — BP 136/86 | HR 61 | Temp 98.3°F | Resp 16 | Ht 68.2 in | Wt 175.0 lb

## 2016-02-04 DIAGNOSIS — E663 Overweight: Secondary | ICD-10-CM | POA: Insufficient documentation

## 2016-02-04 DIAGNOSIS — Z6824 Body mass index (BMI) 24.0-24.9, adult: Secondary | ICD-10-CM | POA: Insufficient documentation

## 2016-02-04 DIAGNOSIS — G8929 Other chronic pain: Secondary | ICD-10-CM | POA: Diagnosis not present

## 2016-02-04 DIAGNOSIS — Z0000 Encounter for general adult medical examination without abnormal findings: Secondary | ICD-10-CM

## 2016-02-04 DIAGNOSIS — E7800 Pure hypercholesterolemia, unspecified: Secondary | ICD-10-CM | POA: Diagnosis not present

## 2016-02-04 DIAGNOSIS — Z205 Contact with and (suspected) exposure to viral hepatitis: Secondary | ICD-10-CM | POA: Insufficient documentation

## 2016-02-04 DIAGNOSIS — Z125 Encounter for screening for malignant neoplasm of prostate: Secondary | ICD-10-CM | POA: Diagnosis not present

## 2016-02-04 DIAGNOSIS — I77810 Thoracic aortic ectasia: Secondary | ICD-10-CM | POA: Diagnosis not present

## 2016-02-04 DIAGNOSIS — I10 Essential (primary) hypertension: Secondary | ICD-10-CM

## 2016-02-04 DIAGNOSIS — E669 Obesity, unspecified: Secondary | ICD-10-CM | POA: Insufficient documentation

## 2016-02-04 DIAGNOSIS — M5441 Lumbago with sciatica, right side: Secondary | ICD-10-CM | POA: Diagnosis not present

## 2016-02-04 DIAGNOSIS — Z1211 Encounter for screening for malignant neoplasm of colon: Secondary | ICD-10-CM | POA: Diagnosis not present

## 2016-02-04 DIAGNOSIS — R9439 Abnormal result of other cardiovascular function study: Secondary | ICD-10-CM

## 2016-02-04 NOTE — Assessment & Plan Note (Signed)
Last PSA 1.0 in 2016, prior DRE unremarkable. No family history of prostate CA Check PSA, future [facility] draw If PSA remains low/stable, then can space out PSA checks q 2 years in future or sooner if change in symptoms

## 2016-02-04 NOTE — Assessment & Plan Note (Signed)
Stable weight Continue current appropriate healthy lifestyle with diet/exercise

## 2016-02-04 NOTE — Progress Notes (Signed)
Subjective:    Patient ID: Todd Frank, male    DOB: Jun 24, 1955, 60 y.o.   MRN: [id number]  Todd Frank is a 60 y.o. male presenting on 02/04/2016 for Annual Exam  HPI  Chronic Low Back / Leg Pain with R-Sciatica / Lumbar Cyst: - Chronic problem over past 1-2 years, see detailed background history in last progress note from establish care visit. Recently established with orthopedics with X-rays, then proceeded to Lumbar MRI given evidence of spinal stenosis and persistent symptoms. Lumbar Spine MRI showed a 1cm synovial cyst at L4-5 with significant DJD and secondary neural compression - Currently stable without worsening pain - Awaiting cardiac clearance prior to neurosurgery scheduling  CHRONIC HTN: Reports no recent concerns, remains well controlled, checks BP regularly at home. Current Meds - None. Previously treated with HCTZ prior to wt loss   Mild-Mod Aortic Stenosis (Biscuspid AoV), Dilated Aortic Root - Followed by [facility] (Dr [name]), recent imaging with follow-up Chest CTA evaluating aorta, on 01/15/16 with mild aneurysmal dilatation of ascending aorta, 4.2 cm without significant change. Additionally, work-up for cardiac clearance with stress test and ECHO in advance of upcoming anticipated Neurosurgery intervention for lumbar spinal cyst. Most recently had myocardial perfusion stress on 01/31/16 with mild abnormal results, see report for interpretation. Now awaiting ECHO and Coronary CT to evaluate calcium. - Denies any recent concerns or new symptoms  HYPERLIPIDEMIA: - Followed by Cardiology, has been advised to take statin with atorvastatin to goal LDL < 70. Recent lipid panels 10/2015 and 01/2016, mild improved HDL and LDL. Interested in monitoring cholesterol q 6 months  Lifestyle: - Previously wt up to 260 lbs (1990s), he has done well to maintain stable wt after loss, currently stable BMI 26, he continues regular exercise up to 7 days a week, mostly low  wt resistance at [facility], walking and cardio several days including weekends  Health Maintenance: - Last colonoscopy 11/16/2006 by Dr. Earle Gell at [facility], reported normal and due again in 10 years, approx 2018. Remains interested in cologuard, would like to wait until next annual physical to start cologuard Fall 2018, he admits to contacting ins and they state it is covered - No family history of prostate cancer. Last PSA 1.0 (01/2015), reports has had numerous normal DRE. Denies any prostate symptoms. Would like repeat PSA testing. - UTD vaccinations - Last Hep C screen 2016, negative ab. He requests repeat Hep C screening lab test, given that his wife (RN at [employer]) was screened positive for Hep C and thought to have had chronic hep C for many years, now starting Harvoni treatment.   Past Medical History:  Diagnosis Date  . Aortic stenosis, mild    mild to moderate AS by echo 07/2012  . Bradycardia 07/23/2015  . Congenital nevus   . Diastolic dysfunction   . Dilated aortic root ([place])   . GERD (gastroesophageal reflux disease)   . HTN (hypertension)   . Hypercholesteremia   . Hyperlipidemia   . LVH (left ventricular hypertrophy)   . Melanoma ([place])   . Melanoma of skin ([place])   . Obesity   . PFO (patent foramen ovale)    small by echo 2014   Social History   Social History  . Marital status: Married    Spouse name: N/A  . Number of children: N/A  . Years of education: N/A   Occupational History  . Government social research officer - [employer]    Social History Main Topics  .  Smoking status: Former Research scientist (life sciences)  . Smokeless tobacco: Never Used  . Alcohol use 0.6 oz/week    1 Cans of beer per week  . Drug use: No  . Sexual activity: Yes   Other Topics Concern  . Not on file   Social History Narrative  . No narrative on file   Family History  Problem Relation Age of Onset  . Diabetes Mother   . Arthritis Father   . Heart disease Paternal Grandmother     MI  . Heart attack      family  history   Current Outpatient Prescriptions on File Prior to Visit  Medication Sig  . aspirin 81 MG tablet Take 81 mg by mouth daily.  Marland Kitchen FEVERFEW PO Take 1 capsule by mouth daily.  . fish oil-omega-3 fatty acids 1000 MG capsule Take 1 g by mouth daily.  . fluticasone (FLONASE) 50 MCG/ACT nasal spray Place 2 sprays into both nostrils daily.  . Glucosamine-Vitamin D (GLUCOSAMINE PLUS VITAMIN D) 1500-200 MG-UNIT TABS Take 2 tablets by mouth daily.  . Turmeric Curcumin 500 MG CAPS Take 1 capsule by mouth 2 (two) times daily.  . vitamin B-12 (CYANOCOBALAMIN) 1000 MCG tablet Take 1,000 mcg by mouth daily.   No current facility-administered medications on file prior to visit.     Review of Systems  Constitutional: Negative for activity change, appetite change, chills, diaphoresis, fatigue, fever and unexpected weight change.  HENT: Negative for congestion, hearing loss and sinus pressure.   Eyes: Negative for visual disturbance.  Respiratory: Negative for cough, chest tightness, shortness of breath and wheezing.   Cardiovascular: Negative for chest pain, palpitations and leg swelling.  Gastrointestinal: Negative for abdominal pain, constipation, diarrhea, nausea and vomiting.  Endocrine: Negative for cold intolerance and polyuria.  Genitourinary: Negative for difficulty urinating, dysuria, frequency, hematuria and testicular pain.  Musculoskeletal: Positive for back pain. Negative for arthralgias, gait problem and neck pain.  Skin: Negative for rash.  Allergic/Immunologic: Negative for environmental allergies.  Neurological: Negative for dizziness, syncope, weakness, light-headedness, numbness and headaches.  Hematological: Negative for adenopathy.  Psychiatric/Behavioral: Negative for behavioral problems, confusion, decreased concentration, dysphoric mood and sleep disturbance. The patient is not nervous/anxious.    Per HPI unless specifically indicated above     Objective:    BP 136/86  (BP Location: Left Arm, Patient Position: Sitting, Cuff Size: Normal)   Pulse 61   Temp 98.3 F (36.8 C) (Oral)   Resp 16   Ht 5' 8.2" (1.732 m)   Wt 175 lb (79.4 kg)   BMI 26.45 kg/m   Wt Readings from Last 3 Encounters:  02/04/16 175 lb (79.4 kg)  01/31/16 179 lb (81.2 kg)  01/09/16 179 lb (81.2 kg)    Physical Exam  Constitutional: He is oriented to person, place, and time. He appears well-developed and well-nourished. No distress.  Well-appearing, comfortable, cooperative  HENT:  Head: Normocephalic and atraumatic.  Mouth/Throat: Oropharynx is clear and moist.  Nasal turbinate mucosa with some thin friable tissue with mild bleeding distally and mild congestion.  Eyes: Conjunctivae and EOM are normal. Pupils are equal, round, and reactive to light.  Neck: Normal range of motion. Neck supple. No thyromegaly present.  Cardiovascular: Normal rate, regular rhythm and intact distal pulses.   Murmur (2/6 systolic murmur over left sternal border, no significant radiation to carotids) heard. Pulmonary/Chest: Effort normal and breath sounds normal. No respiratory distress. He has no wheezes. He has no rales.  Abdominal: Soft. Bowel sounds are normal.  He exhibits no distension and no mass. There is no tenderness.  Musculoskeletal: Normal range of motion. He exhibits no edema or tenderness.  Low Back Inspection: Normal appearance, no spinal deformity, symmetrical. Palpation: No tenderness over spinous processes. Stable mild tender over Right lower lumbosacral region. Bilateral lumbar paraspinal muscles non-tender and without hypertonicity/spasm. ROM: Full active ROM forward flex / back extension, rotation L/R without discomfort Special Testing: Seated SLR negative for radicular pain bilaterally Strength: Bilateral hip flex/ext 5/5, knee flex/ext 5/5, ankle dorsiflex/plantarflex 5/5  Lymphadenopathy:    He has no cervical adenopathy.  Neurological: He is alert and oriented to person,  place, and time.  Gait normal Distal sensation to light touch intact.  Skin: Skin is warm and dry. No rash noted. He is not diaphoretic.  Psychiatric: He has a normal mood and affect. His behavior is normal.  Nursing note and vitals reviewed.   Reviewed images and radiology report on last available MRI Lumbar 01/12/16  Lumbar MRI Spine  IMPRESSION: The significant findings are at the L4-5 level. The disc is degenerated and bulges mildly. There is advanced bilateral facet arthropathy, including a 1 cm synovial cyst projecting inward from the facet on the right. There is spinal stenosis at this level, most severe in the lateral recesses, right much worse than left because of the synovial cyst. Neural compression seems virtually certain on the right, and could also be occurring on the left.  Lipid Panel     Component Value Date/Time   CHOL 174 01/29/2016 0838   CHOL 173 11/02/2015   TRIG 64 01/29/2016 0838   TRIG 69 11/02/2015   HDL 60 01/29/2016 0838   CHOLHDL 3.1 11/02/2015   VLDL 14 11/02/2015   LDLCALC 101 (H) 01/29/2016 0838   LDLCALC 104 (A) 11/02/2015       Assessment & Plan:   Problem List Items Addressed This Visit    Prostate cancer screening    Last PSA 1.0 in 2016, prior DRE unremarkable. No family history of prostate CA Check PSA, future [facility] draw If PSA remains low/stable, then can space out PSA checks q 2 years in future or sooner if change in symptoms      Relevant Orders   PSA   Overweight (BMI 25.0-29.9)    Stable weight Continue current appropriate healthy lifestyle with diet/exercise      RESOLVED: Obesity   Hypercholesteremia    Stable to mild gradual improvement on lifestyle modifications. - Continues to decline statin therapy - Last lipids 01/2016 still improved LDL 104 to 101, not reaching goal < 70 per Cardiology, still improving HDL  Plan: 1. Recommend may consider lowest dose vs infrequent statin dosing in future, patient declines.  Advised may discuss PSK9 inhibitors with Cardiology, however usually indicated in patients already on maximum statin therapy  2. Continue supplements, Fish Oil 3. Follow-up Cardiology, anticipate keep monitor lipids q 6 mo      HTN (hypertension)    Stable, well controlled on lifestyle w/o meds - Continue monitor closely outside office - Follow-up monitoring with Cardiology      Exposure to hepatitis C    Last screening Hep C negative 2016. Recent diagnosis of wife with chronic Hep C (likely exposure either as Dietitian or with prior transfusion), currently treated with Harvoni per Dr Allen Norris - Suspect patient is low risk for contracting Hep C given no prior problem  Plan: 1. Re-check screening Hep C, future [facility] order 2. Advised to discuss risk with wife's GI  Dr Allen Norris, may consider repeat test in future after she has completed Harvoni      Relevant Orders   Hepatitis C antibody   Dilated aortic root ([place])    Stable on last CT angio Followed by Cardiology      Colon cancer screening    Last colonoscopy 11/2006, will be due after 11/2016 - Patient has contacted [facility] ins and cologuard is covered - Follow-up Annual Physical in 1 year Fall 2018, proceed with Cologuard screening, patient understands will need colonoscopy if positive      Chronic low back pain with right-sided sciatica    Stable chronic LBP without worsening - Followed by Ortho and Neurosurgery - Last MRI 01/12/16, L4-5 1 cm synovial cyst with DJD and neural compression  Plan: 1. Continue current treatment plan, and follow-up with Neurosurgery as soon as receives cardiac clearance, now awaiting coronary CT and ECHO       Other Visit Diagnoses    Annual physical exam    -  Primary      No orders of the defined types were placed in this encounter.     Follow up plan: Return in about 1 year (around 02/03/2017) for Annual Physical.  Nobie Putnam, DO [facility] 02/04/2016, 9:52 PM

## 2016-02-04 NOTE — Assessment & Plan Note (Signed)
Last colonoscopy 11/2006, will be due after 11/2016 - Patient has contacted [facility] ins and cologuard is covered - Follow-up Annual Physical in 1 year Fall 2018, proceed with Cologuard screening, patient understands will need colonoscopy if positive

## 2016-02-04 NOTE — Assessment & Plan Note (Signed)
Stable on last CT angio Followed by Cardiology

## 2016-02-04 NOTE — Patient Instructions (Addendum)
Thank you for coming in to clinic today.  1. Reminder to follow-up with Dr Radford Pax regarding recent Myocardial Perfusion test results and discussion regarding your pre-op surgical clearance for Lumbar cyst.  2. Good news on slowly improving cholesterol as well and some gradual weight loss. Keep up the good work. Let me know by MyChart message when you are ready for another repeat cholesterol and we can arrange it.  3. Ordered Hep C and PSA screening tests today - if hep C is negative again, and your wife is on treatment, I would suspect you are extremely low risk for a problem, perhaps can review this with Dr Allen Norris as well to confirm.  Here is the reminder on Cologuard again - plan in 2018, can order this at your next physical if you want  Colon Cancer Screening: - For all adults age 72+ routine colon cancer screening is highly recommended. - Early detection of colon cancer is important, because often there are no warning signs or symptoms, also if found early usually it can be cured. Late stage is hard to treat.  - If you are not interested in Colonoscopy screening (if done and normal you could be cleared for 5 to 10 years until next due), then Cologuard is an excellent alternative for screening test for Colon Cancer. It is highly sensitive for detecting DNA of colon cancer from even the earliest stages. Also, there is NO bowel prep required. - If Cologuard is NEGATIVE, then it is good for 3 years before next due - If Cologuard is POSITIVE, then it is strongly advised to get a Colonoscopy, which allows the GI doctor to locate the source of the cancer or polyp (even very early stage) and treat it by removing it.  - If you want to proceed, you can notify us (phone message, [place], or at next physical exam) and we will order it for you. The test kit will be delivered to you house within about 1 week. Follow instructions to collect sample, you may call the company for any help or questions,  24/7 telephone support at [phone].  About 1-2 weeks before your next annual physical, you will be due for FASTING BLOOD WORK (no food or drink after midnight before, only water or coffee without cream/sugar on the morning of) - Notify us in advance to get labs ordered  Please schedule a follow-up appointment with Dr. Parks Ranger in 1 year for Annual Physical (labs A1c, Lipids, chemistry, PSA - cologuard)  If you have any other questions or concerns, please feel free to call the clinic or send a message through [place]. You may also schedule an earlier appointment if necessary.  Nobie Putnam, DO [place]

## 2016-02-04 NOTE — Assessment & Plan Note (Signed)
Stable chronic LBP without worsening - Followed by Ortho and Neurosurgery - Last MRI 01/12/16, L4-5 1 cm synovial cyst with DJD and neural compression  Plan: 1. Continue current treatment plan, and follow-up with Neurosurgery as soon as receives cardiac clearance, now awaiting coronary CT and ECHO

## 2016-02-04 NOTE — Assessment & Plan Note (Signed)
Stable to mild gradual improvement on lifestyle modifications. - Continues to decline statin therapy - Last lipids 01/2016 still improved LDL 104 to 101, not reaching goal < 70 per Cardiology, still improving HDL  Plan: 1. Recommend may consider lowest dose vs infrequent statin dosing in future, patient declines. Advised may discuss PSK9 inhibitors with Cardiology, however usually indicated in patients already on maximum statin therapy  2. Continue supplements, Fish Oil 3. Follow-up Cardiology, anticipate keep monitor lipids q 6 mo

## 2016-02-04 NOTE — Telephone Encounter (Signed)
Informed patient of results and verbal understanding expressed.   Coronary CT ordered for scheduling. Patient agrees with treatment plan. 

## 2016-02-04 NOTE — Telephone Encounter (Signed)
-----   Message from [name], MD sent at 02/01/2016  8:09 AM EDT ----- Abnormal nuclear stress test - ? Prior infarct with mild ischemia vs. Diaphragmatic attenuation.  please get Coronary CTA with morphology and calcium score.

## 2016-02-04 NOTE — Assessment & Plan Note (Signed)
Stable, well controlled on lifestyle w/o meds - Continue monitor closely outside office - Follow-up monitoring with Cardiology

## 2016-02-04 NOTE — Assessment & Plan Note (Signed)
Last screening Hep C negative 2016. Recent diagnosis of wife with chronic Hep C (likely exposure either as Dietitian or with prior transfusion), currently treated with Harvoni per Dr Allen Norris - Suspect patient is low risk for contracting Hep C given no prior problem  Plan: 1. Re-check screening Hep C, future [facility] order 2. Advised to discuss risk with wife's GI Dr Allen Norris, may consider repeat test in future after she has completed Harvoni

## 2016-02-05 ENCOUNTER — Encounter: Payer: Self-pay | Admitting: Cardiology

## 2016-02-07 LAB — HEPATITIS C ANTIBODY: HEP C VIRUS AB: 0.1 {s_co_ratio} (ref 0.0–0.9)

## 2016-02-07 LAB — PSA: PROSTATE SPECIFIC AG, SERUM: 0.9 ng/mL (ref 0.0–4.0)

## 2016-02-11 ENCOUNTER — Encounter: Payer: Self-pay | Admitting: Cardiology

## 2016-02-13 ENCOUNTER — Other Ambulatory Visit: Payer: Self-pay

## 2016-02-13 ENCOUNTER — Encounter (HOSPITAL_COMMUNITY): Payer: 59

## 2016-02-13 ENCOUNTER — Ambulatory Visit (HOSPITAL_COMMUNITY): Payer: 59 | Attending: Cardiology

## 2016-02-13 DIAGNOSIS — I350 Nonrheumatic aortic (valve) stenosis: Secondary | ICD-10-CM | POA: Diagnosis not present

## 2016-02-13 LAB — ECHOCARDIOGRAM COMPLETE
AO mean calculated velocity dopler: 253 cm/s
AOASC: 41 cm
AOPV: 0.25 m/s
AOVTI: 89.9 cm
AV Area VTI index: 0.46 cm2/m2
AV Area VTI: 0.86 cm2
AV Area mean vel: 0.91 cm2
AV VEL mean LVOT/AV: 0.26
AV pk vel: 349 cm/s
AV vel: 0.9
AVAREAMEANVIN: 0.47 cm2/m2
AVG: 30 mmHg
AVPG: 49 mmHg
AVPHT: 699 ms
CHL CUP AV PEAK INDEX: 0.44
CHL CUP AV VALUE AREA INDEX: 0.46
CHL CUP RV SYS PRESS: 28 mmHg
CHL CUP TV REG PEAK VELOCITY: 249 cm/s
E decel time: 335 msec
E/e' ratio: 9.07
FS: 30 % (ref 28–44)
IVS/LV PW RATIO, ED: 1.06
LA ID, A-P, ES: 47 mm
LA diam end sys: 47 mm
LA vol: 50.1 mL
LADIAMINDEX: 2.42 cm/m2
LAVOLA4C: 42.2 mL
LAVOLIN: 25.8 mL/m2
LV PW d: 12.2 mm — AB (ref 0.6–1.1)
LV TDI E'LATERAL: 9.14
LV TDI E'MEDIAL: 6.31
LVEEAVG: 9.07
LVEEMED: 9.07
LVELAT: 9.14 cm/s
LVOT SV: 81 mL
LVOT VTI: 23.5 cm
LVOT area: 3.46 cm2
LVOT peak VTI: 0.26 cm
LVOT peak vel: 87.2 cm/s
LVOTD: 21 mm
Lateral S' vel: 14.1 cm/s
MV Dec: 335
MV pk E vel: 82.9 m/s
MVPG: 3 mmHg
MVPKAVEL: 92.2 m/s
TAPSE: 31.3 mm
TR max vel: 249 cm/s
Valve area: 0.9 cm2

## 2016-02-14 ENCOUNTER — Encounter: Payer: Self-pay | Admitting: Cardiology

## 2016-02-15 ENCOUNTER — Encounter: Payer: Self-pay | Admitting: Physician Assistant

## 2016-02-15 ENCOUNTER — Encounter: Payer: Self-pay | Admitting: *Deleted

## 2016-02-15 ENCOUNTER — Ambulatory Visit (INDEPENDENT_AMBULATORY_CARE_PROVIDER_SITE_OTHER): Payer: 59 | Admitting: Physician Assistant

## 2016-02-15 VITALS — BP 138/90 | HR 64 | Ht 68.2 in | Wt 176.0 lb

## 2016-02-15 DIAGNOSIS — E785 Hyperlipidemia, unspecified: Secondary | ICD-10-CM

## 2016-02-15 DIAGNOSIS — R9439 Abnormal result of other cardiovascular function study: Secondary | ICD-10-CM

## 2016-02-15 DIAGNOSIS — R55 Syncope and collapse: Secondary | ICD-10-CM

## 2016-02-15 DIAGNOSIS — R0989 Other specified symptoms and signs involving the circulatory and respiratory systems: Secondary | ICD-10-CM

## 2016-02-15 DIAGNOSIS — I10 Essential (primary) hypertension: Secondary | ICD-10-CM

## 2016-02-15 DIAGNOSIS — I350 Nonrheumatic aortic (valve) stenosis: Secondary | ICD-10-CM | POA: Diagnosis not present

## 2016-02-15 LAB — CBC WITH DIFFERENTIAL/PLATELET
Basophils Absolute: 50 {cells}/uL (ref 0–200)
Basophils Relative: 1 %
Eosinophils Absolute: 200 {cells}/uL (ref 15–500)
Eosinophils Relative: 4 %
HCT: 44.9 % (ref 38.5–50.0)
Hemoglobin: 15 g/dL (ref 13.2–17.1)
Lymphocytes Relative: 29 %
Lymphs Abs: 1450 {cells}/uL (ref 850–3900)
MCH: 31.3 pg (ref 27.0–33.0)
MCHC: 33.4 g/dL (ref 32.0–36.0)
MCV: 93.5 fL (ref 80.0–100.0)
MPV: 12 fL (ref 7.5–12.5)
Monocytes Absolute: 500 {cells}/uL (ref 200–950)
Monocytes Relative: 10 %
Neutro Abs: 2800 {cells}/uL (ref 1500–7800)
Neutrophils Relative %: 56 %
Platelets: 152 K/uL (ref 140–400)
RBC: 4.8 MIL/uL (ref 4.20–5.80)
RDW: 13.2 % (ref 11.0–15.0)
WBC: 5 K/uL (ref 3.8–10.8)

## 2016-02-15 LAB — BASIC METABOLIC PANEL WITH GFR
BUN: 18 mg/dL (ref 7–25)
CO2: 27 mmol/L (ref 20–31)
Calcium: 9.3 mg/dL (ref 8.6–10.3)
Chloride: 105 mmol/L (ref 98–110)
Creat: 0.84 mg/dL (ref 0.70–1.25)
Glucose, Bld: 71 mg/dL (ref 65–99)
Potassium: 4.2 mmol/L (ref 3.5–5.3)
Sodium: 140 mmol/L (ref 135–146)

## 2016-02-15 LAB — PROTIME-INR
INR: 1.1
PROTHROMBIN TIME: 11.7 s — AB (ref 9.0–11.5)

## 2016-02-15 NOTE — Progress Notes (Signed)
Cardiology Office Note    Date:  02/15/2016  ID:  Lennon, Kucharski 1955/08/22, MRN [id number] PCP:  Nobie Putnam, DO  Cardiologist:  [name]   Chief Complaint: discuss cath  History of Present Illness:  Todd Frank is a 60 y.o. male with history of HTN, hyperlipidemia, bicuspid aortic valve with AS, diastolic dysfunction, dilated ascending aorta, small PFO, bradycardia, obesity who presents for follow-up of abnormal testing. He had pre-syncope/possible syncope earlier this year. In October he emailed [name] to inquire about pre-op clearance for a synovial cyst on his lumbar spine, and stress test + nuc were recommended prior to clearance. Nuc 01/31/16 showed  prior basal inferolateral infarct and very mild peri-infarct ischemia; EF 53 with hypokinesis of the basal inferolateral wall; mild LVE. 2D Echo 02/13/16 showed mild LVH, EF 55-60%, grade 1 DD, moderate-severe aortic stenosis, dilated ascending aorta 100mm, normal RV, mild RAE. [name] recommended he proceed with [facility] to further evaluate these findings given his pre-syncope earlier this year. Last labs 01/2016 showed Cr 0.84; Hgb 15.1 and plt 125 in 08/2015.  He returns back to clinic with his wife to discuss cath. He exercises 6 days a week without any functional limitation. No CP or SOB. No palpitations. No recurrent syncope. He does report intermittent episodes of random lightheadedness that occurs without any pattern. It feels like sinus pressure across his head. His wife is a International aid/development worker. They have a lot of questions about cardiac cath, stenting, collaterals, etc that I tried my best to answer.    Past Medical History:  Diagnosis Date  . Aortic stenosis    a. mod-severe by echo 02/2016.  . Bicuspid aortic valve   . Bradycardia 07/23/2015  . Congenital nevus   . Diastolic dysfunction   . Dilated aortic root ([place])   . GERD (gastroesophageal reflux disease)   . HTN (hypertension)   . Hyperlipidemia   . LVH  (left ventricular hypertrophy)   . Melanoma ([place])   . Melanoma of skin ([place])   . Obesity   . PFO (patent foramen ovale)    small by echo 2014    Past Surgical History:  Procedure Laterality Date  . EYE SURGERY    . MELANOMA EXCISION  2011   left thigh  . TONSILLECTOMY      Current Medications: Current Outpatient Prescriptions  Medication Sig Dispense Refill  . aspirin 81 MG tablet Take 81 mg by mouth daily.    Marland Kitchen FEVERFEW PO Take 1 capsule by mouth daily.    . fish oil-omega-3 fatty acids 1000 MG capsule Take 1 g by mouth daily.    . fluticasone (FLONASE) 50 MCG/ACT nasal spray Place 2 sprays into both nostrils daily. 16 g 12  . Glucosamine-Vitamin D (GLUCOSAMINE PLUS VITAMIN D) 1500-200 MG-UNIT TABS Take 2 tablets by mouth daily.    . Turmeric Curcumin 500 MG CAPS Take 1 capsule by mouth 2 (two) times daily.    . vitamin B-12 (CYANOCOBALAMIN) 1000 MCG tablet Take 1,000 mcg by mouth daily.     No current facility-administered medications for this visit.      Allergies:   Patient has no known allergies.   Social History   Social History  . Marital status: Married    Spouse name: N/A  . Number of children: N/A  . Years of education: N/A   Occupational History  . Government social research officer - [employer]    Social History Main Topics  . Smoking status: Former  Smoker  . Smokeless tobacco: Never Used  . Alcohol use 0.6 oz/week    1 Cans of beer per week  . Drug use: No  . Sexual activity: Yes   Other Topics Concern  . None   Social History Narrative  . None     Family History:  The patient's family history includes Arthritis in his father; Diabetes in his mother; Heart disease in his paternal grandmother.   ROS:   Please see the history of present illness.  All other systems are reviewed and otherwise negative.    PHYSICAL EXAM:   VS:  BP 138/90 (BP Location: Left Arm, Patient Position: Sitting, Cuff Size: Normal)   Pulse 64   Ht 5' 8.2" (1.732 m)   Wt 176 lb (79.8 kg)    BMI 26.60 kg/m   BMI: Body mass index is 26.6 kg/m. GEN: Well nourished, well developed WM, in no acute distress  HEENT: normocephalic, atraumatic Neck: no JVD or masses. + loud bilateral carotid bruits. Cardiac: RRR; 2/6 SEM at RUSB with preserved S2. No rubs or gallops, no edema  Respiratory:  clear to auscultation bilaterally, normal work of breathing GI: soft, nontender, nondistended, + BS MS: no deformity or atrophy  Skin: warm and dry, no rash Neuro:  Alert and Oriented x 3, Strength and sensation are intact, follows commands Psych: euthymic mood, full affect  Wt Readings from Last 3 Encounters:  02/15/16 176 lb (79.8 kg)  02/04/16 175 lb (79.4 kg)  01/31/16 179 lb (81.2 kg)      Studies/Labs Reviewed:   EKG:   EKG was not ordered today.  Recent Labs: 08/07/2015: Hemoglobin 15.1; Platelets 125 09/07/2015: ALT 15 01/14/2016: BUN 13; Creat 0.84; Potassium 4.1; Sodium 140   Lipid Panel    Component Value Date/Time   CHOL 174 01/29/2016 0838   CHOL 173 11/02/2015   TRIG 64 01/29/2016 0838   TRIG 69 11/02/2015   HDL 60 01/29/2016 0838   CHOLHDL 3.1 11/02/2015   VLDL 14 11/02/2015   LDLCALC 101 (H) 01/29/2016 0838   LDLCALC 104 (A) 11/02/2015    Additional studies/ records that were reviewed today include: Summarized above.    ASSESSMENT & PLAN:   1. Bicuspid AV with progress aortic stenosis and abnormal stress test - reviewed with [name]. Given the progression in AS by his echo and history of presyncope, she recommends R/LHC. Reviewed in depth with patient. Risks and benefits of cardiac catheterization have been discussed with the patient. These include bleeding, infection, kidney damage, stroke, heart attack, death. The patient understands these risks and is willing to proceed. Will check pre-cath labs today. 2. Presyncope - see above - for eval with R/LHC. No recent recurrence. 3. Essential HTN - with h/o lightheadedness, will not make any changes  today. 4. Hyperlipidemia - if CAD is present would recommend initiation of statin. LDL in October was 101. 5. Carotid bruits - I suspect this is related to his aortic stenosis but will obtain carotid duplex to exclude carotid stenosis.  Disposition: F/u with me 2-3 weeks after heart cath.   Medication Adjustments/Labs and Tests Ordered: Current medicines are reviewed at length with the patient today.  Concerns regarding medicines are outlined above. Medication changes, Labs and Tests ordered today are summarized above and listed in the Patient Instructions accessible in Encounters.   Raechel Ache PA-C  02/15/2016 2:18 PM    [facility] [place], [place], [place]  [address] Phone: [phone])  [phone]; Fax: [phone]

## 2016-02-15 NOTE — Patient Instructions (Addendum)
Medication Instructions:  Your physician recommends that you continue on your current medications as directed. Please refer to the Current Medication list given to you today.  Labwork: TODAY:  BMET, CBC W/DIFF, & PT/INR  Testing/Procedures: Your physician has requested that you have a cardiac catheterization. Cardiac catheterization is used to diagnose and/or treat various heart conditions. Doctors may recommend this procedure for a number of different reasons. The most common reason is to evaluate chest pain. Chest pain can be a symptom of coronary artery disease (CAD), and cardiac catheterization can show whether plaque is narrowing or blocking your heart's arteries. This procedure is also used to evaluate the valves, as well as measure the blood flow and oxygen levels in different parts of your heart. For further information please visit [email or web address]. Please follow instruction sheet, as given.   Your physician has requested that you have a carotid duplex. This test is an ultrasound of the carotid arteries in your neck. It looks at blood flow through these arteries that supply the brain with blood. Allow one hour for this exam. There are no restrictions or special instructions.  Follow-Up: Your physician recommends that you schedule a follow-up appointment in: WILL BE SET UP AT DISCHARGE   Any Other Special Instructions Will Be Listed Below (If Applicable).   Coronary Angiogram A coronary angiogram, also called coronary angiography, is an X-ray procedure used to look at the arteries in the heart. In this procedure, a dye (contrast dye) is injected through a long, hollow tube (catheter). The catheter is about the size of a piece of cooked spaghetti and is inserted through your groin, wrist, or arm. The dye is injected into each artery, and X-rays are then taken to show if there is a blockage in the arteries of your heart. LET [facility] CARE PROVIDER KNOW ABOUT:  Any allergies you  have, including allergies to shellfish or contrast dye.   All medicines you are taking, including vitamins, herbs, eye drops, creams, and over-the-counter medicines.   Previous problems you or members of your family have had with the use of anesthetics.   Any blood disorders you have.   Previous surgeries you have had.  History of kidney problems or failure.   Other medical conditions you have. RISKS AND COMPLICATIONS  Generally, a coronary angiogram is a safe procedure. However, problems can occur and include:  Allergic reaction to the dye.  Bleeding from the access site or other locations.  Kidney injury, especially in people with impaired kidney function.  Stroke (rare).  Heart attack (rare). BEFORE THE PROCEDURE   Do not eat or drink anything after midnight the night before the procedure or as directed by your health care provider.   Ask your health care provider about changing or stopping your regular medicines. This is especially important if you are taking diabetes medicines or blood thinners. PROCEDURE  You may be given a medicine to help you relax (sedative) before the procedure. This medicine is given through an intravenous (IV) access tube that is inserted into one of your veins.   The area where the catheter will be inserted will be washed and shaved. This is usually done in the groin but may be done in the fold of your arm (near your elbow) or in the wrist.   A medicine will be given to numb the area where the catheter will be inserted (local anesthetic).   The health care provider will insert the catheter into an artery. The catheter will be guided  by using a special type of X-ray (fluoroscopy) of the blood vessel being examined.   A special dye will then be injected into the catheter, and X-rays will be taken. The dye will help to show where any narrowing or blockages are located in the heart arteries.  AFTER THE PROCEDURE   If the procedure is  done through the leg, you will be kept in bed lying flat for several hours. You will be instructed to not bend or cross your legs.  The insertion site will be checked frequently.   The pulse in your feet or wrist will be checked frequently.   Additional blood tests, X-rays, and an electrocardiogram may be done.    This information is not intended to replace advice given to you by your health care provider. Make sure you discuss any questions you have with your health care provider.   Document Released: 09/28/2002 Document Revised: 04/14/2014 Document Reviewed: 08/16/2012 Elsevier Interactive Patient Education [employer].   If you need a refill on your cardiac medications before your next appointment, please call your pharmacy.

## 2016-02-16 ENCOUNTER — Encounter: Payer: Self-pay | Admitting: Cardiology

## 2016-02-20 ENCOUNTER — Ambulatory Visit (HOSPITAL_COMMUNITY)
Admission: RE | Admit: 2016-02-20 | Discharge: 2016-02-20 | Disposition: A | Discharge status: Home / Self Care | Payer: 59 | Source: Ambulatory Visit | Attending: Interventional Cardiology | Admitting: Interventional Cardiology

## 2016-02-20 ENCOUNTER — Encounter (HOSPITAL_COMMUNITY): Payer: Self-pay | Admitting: Interventional Cardiology

## 2016-02-20 ENCOUNTER — Other Ambulatory Visit: Payer: Self-pay

## 2016-02-20 ENCOUNTER — Encounter (HOSPITAL_COMMUNITY)
Admission: RE | Disposition: A | Discharge status: Home / Self Care | Payer: Self-pay | Source: Ambulatory Visit | Attending: Interventional Cardiology

## 2016-02-20 DIAGNOSIS — Z87891 Personal history of nicotine dependence: Secondary | ICD-10-CM | POA: Insufficient documentation

## 2016-02-20 DIAGNOSIS — I2510 Atherosclerotic heart disease of native coronary artery without angina pectoris: Secondary | ICD-10-CM | POA: Diagnosis not present

## 2016-02-20 DIAGNOSIS — Z85820 Personal history of malignant melanoma of skin: Secondary | ICD-10-CM | POA: Diagnosis not present

## 2016-02-20 DIAGNOSIS — Q231 Congenital insufficiency of aortic valve: Secondary | ICD-10-CM | POA: Insufficient documentation

## 2016-02-20 DIAGNOSIS — R9439 Abnormal result of other cardiovascular function study: Secondary | ICD-10-CM

## 2016-02-20 DIAGNOSIS — Z7982 Long term (current) use of aspirin: Secondary | ICD-10-CM | POA: Diagnosis not present

## 2016-02-20 DIAGNOSIS — I77810 Thoracic aortic ectasia: Secondary | ICD-10-CM | POA: Diagnosis not present

## 2016-02-20 DIAGNOSIS — Z833 Family history of diabetes mellitus: Secondary | ICD-10-CM | POA: Insufficient documentation

## 2016-02-20 DIAGNOSIS — I10 Essential (primary) hypertension: Secondary | ICD-10-CM | POA: Diagnosis present

## 2016-02-20 DIAGNOSIS — Z79899 Other long term (current) drug therapy: Secondary | ICD-10-CM | POA: Diagnosis not present

## 2016-02-20 DIAGNOSIS — E785 Hyperlipidemia, unspecified: Secondary | ICD-10-CM | POA: Insufficient documentation

## 2016-02-20 DIAGNOSIS — I2582 Chronic total occlusion of coronary artery: Secondary | ICD-10-CM | POA: Diagnosis not present

## 2016-02-20 DIAGNOSIS — I350 Nonrheumatic aortic (valve) stenosis: Secondary | ICD-10-CM | POA: Diagnosis not present

## 2016-02-20 DIAGNOSIS — Z6826 Body mass index (BMI) 26.0-26.9, adult: Secondary | ICD-10-CM | POA: Diagnosis not present

## 2016-02-20 DIAGNOSIS — Q2112 Patent foramen ovale: Secondary | ICD-10-CM

## 2016-02-20 DIAGNOSIS — Q211 Atrial septal defect: Secondary | ICD-10-CM | POA: Insufficient documentation

## 2016-02-20 DIAGNOSIS — E669 Obesity, unspecified: Secondary | ICD-10-CM | POA: Insufficient documentation

## 2016-02-20 DIAGNOSIS — K219 Gastro-esophageal reflux disease without esophagitis: Secondary | ICD-10-CM | POA: Diagnosis not present

## 2016-02-20 HISTORY — PX: CARDIAC CATHETERIZATION: SHX172

## 2016-02-20 LAB — POCT I-STAT 3, ART BLOOD GAS (G3+)
ACID-BASE DEFICIT: 2 mmol/L (ref 0.0–2.0)
BICARBONATE: 23.7 mmol/L (ref 20.0–28.0)
O2 SAT: 98 %
PO2 ART: 113 mmHg — AB (ref 83.0–108.0)
TCO2: 25 mmol/L (ref 0–100)
pCO2 arterial: 40.7 mmHg (ref 32.0–48.0)
pH, Arterial: 7.372 (ref 7.350–7.450)

## 2016-02-20 LAB — POCT I-STAT 3, VENOUS BLOOD GAS (G3P V)
Acid-base deficit: 1 mmol/L (ref 0.0–2.0)
BICARBONATE: 24.7 mmol/L (ref 20.0–28.0)
O2 SAT: 79 %
PH VEN: 7.359 (ref 7.250–7.430)
TCO2: 26 mmol/L (ref 0–100)
pCO2, Ven: 43.8 mmHg — ABNORMAL LOW (ref 44.0–60.0)
pO2, Ven: 45 mmHg (ref 32.0–45.0)

## 2016-02-20 SURGERY — RIGHT/LEFT HEART CATH AND CORONARY ANGIOGRAPHY
Anesthesia: LOCAL

## 2016-02-20 MED ORDER — VERAPAMIL HCL 2.5 MG/ML IV SOLN
INTRAVENOUS | Status: AC
  Filled 2016-02-20: qty 2

## 2016-02-20 MED ORDER — ONDANSETRON HCL 4 MG/2ML IJ SOLN: 4.0000 mg | Freq: Four times a day (QID) | INTRAMUSCULAR | Status: DC | PRN

## 2016-02-20 MED ORDER — OXYCODONE-ACETAMINOPHEN 5-325 MG PO TABS: 1.0000 | ORAL_TABLET | ORAL | Status: DC | PRN

## 2016-02-20 MED ORDER — HEPARIN (PORCINE) IN NACL 2-0.9 UNIT/ML-% IJ SOLN
INTRAMUSCULAR | Status: DC | PRN
  Administered 2016-02-20: 1500 mL

## 2016-02-20 MED ORDER — FENTANYL CITRATE (PF) 100 MCG/2ML IJ SOLN
INTRAMUSCULAR | Status: DC | PRN
  Administered 2016-02-20: 25 ug via INTRAVENOUS

## 2016-02-20 MED ORDER — SODIUM CHLORIDE 0.9% FLUSH: 3.0000 mL | INTRAVENOUS | Status: DC | PRN

## 2016-02-20 MED ORDER — SODIUM CHLORIDE 0.9 % IV SOLN: 250.0000 mL | INTRAVENOUS | Status: DC | PRN

## 2016-02-20 MED ORDER — SODIUM CHLORIDE 0.9% FLUSH: 3.0000 mL | Freq: Two times a day (BID) | INTRAVENOUS | Status: DC

## 2016-02-20 MED ORDER — HEPARIN SODIUM (PORCINE) 1000 UNIT/ML IJ SOLN
INTRAMUSCULAR | Status: AC
Start: 2016-02-20 — End: 2016-02-20
  Filled 2016-02-20: qty 1

## 2016-02-20 MED ORDER — ASPIRIN 81 MG PO CHEW: 81.0000 mg | CHEWABLE_TABLET | Freq: Every day | ORAL | Status: DC

## 2016-02-20 MED ORDER — ACETAMINOPHEN 325 MG PO TABS: 650.0000 mg | ORAL_TABLET | ORAL | Status: DC | PRN

## 2016-02-20 MED ORDER — MIDAZOLAM HCL 2 MG/2ML IJ SOLN
INTRAMUSCULAR | Status: AC
  Filled 2016-02-20: qty 2

## 2016-02-20 MED ORDER — SODIUM CHLORIDE 0.9 % IV SOLN
INTRAVENOUS | Status: DC | PRN
  Administered 2016-02-20: 500 mL via INTRAVENOUS

## 2016-02-20 MED ORDER — FENTANYL CITRATE (PF) 100 MCG/2ML IJ SOLN
INTRAMUSCULAR | Status: AC
  Filled 2016-02-20: qty 2

## 2016-02-20 MED ORDER — ASPIRIN 81 MG PO CHEW: 81.0000 mg | CHEWABLE_TABLET | ORAL | Status: DC

## 2016-02-20 MED ORDER — MIDAZOLAM HCL 2 MG/2ML IJ SOLN
INTRAMUSCULAR | Status: DC | PRN
  Administered 2016-02-20 (×2): 1 mg via INTRAVENOUS

## 2016-02-20 MED ORDER — HEPARIN SODIUM (PORCINE) 1000 UNIT/ML IJ SOLN
INTRAMUSCULAR | Status: DC | PRN
  Administered 2016-02-20: 4000 [IU] via INTRAVENOUS

## 2016-02-20 MED ORDER — LIDOCAINE HCL (PF) 1 % IJ SOLN
INTRAMUSCULAR | Status: AC
  Filled 2016-02-20: qty 30

## 2016-02-20 MED ORDER — SODIUM CHLORIDE 0.9 % WEIGHT BASED INFUSION
3.0000 mL/kg/h | INTRAVENOUS | Status: DC
  Administered 2016-02-20: 3 mL/kg/h via INTRAVENOUS

## 2016-02-20 MED ORDER — IOPAMIDOL (ISOVUE-370) INJECTION 76%
INTRAVENOUS | Status: AC
  Filled 2016-02-20: qty 100

## 2016-02-20 MED ORDER — HEPARIN (PORCINE) IN NACL 2-0.9 UNIT/ML-% IJ SOLN
INTRAMUSCULAR | Status: DC | PRN
  Administered 2016-02-20: 10 mL via INTRA_ARTERIAL

## 2016-02-20 MED ORDER — SODIUM CHLORIDE 0.9 % WEIGHT BASED INFUSION
3.0000 mL/kg/h | INTRAVENOUS | Status: AC
  Administered 2016-02-20: 3 mL/kg/h via INTRAVENOUS

## 2016-02-20 MED ORDER — SODIUM CHLORIDE 0.9 % WEIGHT BASED INFUSION: 1.0000 mL/kg/h | INTRAVENOUS | Status: DC

## 2016-02-20 MED ORDER — HEPARIN (PORCINE) IN NACL 2-0.9 UNIT/ML-% IJ SOLN
INTRAMUSCULAR | Status: AC
  Filled 2016-02-20: qty 1000

## 2016-02-20 MED ORDER — LIDOCAINE HCL (PF) 1 % IJ SOLN
INTRAMUSCULAR | Status: DC | PRN
  Administered 2016-02-20: 2 mL via INTRADERMAL
  Administered 2016-02-20: 5 mL via INTRADERMAL

## 2016-02-20 MED ORDER — IOPAMIDOL (ISOVUE-370) INJECTION 76%
INTRAVENOUS | Status: DC | PRN
  Administered 2016-02-20: 90 mL via INTRA_ARTERIAL

## 2016-02-20 SURGICAL SUPPLY — 12 items
CATH 5FR JL3.5 JR4 ANG PIG MP (CATHETERS) ×2 IMPLANT
CATH BALLN WEDGE 5F 110CM (CATHETERS) ×2 IMPLANT
DEVICE RAD COMP TR BAND LRG (VASCULAR PRODUCTS) ×2 IMPLANT
GLIDESHEATH SLEND A-KIT 6F 22G (SHEATH) ×2 IMPLANT
GUIDEWIRE INQWIRE 1.5J.035X260 (WIRE) ×2 IMPLANT
INQWIRE 1.5J .035X260CM (WIRE) ×4
KIT HEART LEFT (KITS) ×2 IMPLANT
PACK CARDIAC CATHETERIZATION (CUSTOM PROCEDURE TRAY) ×2 IMPLANT
SHEATH FAST CATH BRACH 5F 5CM (SHEATH) ×2 IMPLANT
TRANSDUCER W/STOPCOCK (MISCELLANEOUS) ×2 IMPLANT
TUBING CIL FLEX 10 FLL-RA (TUBING) ×2 IMPLANT
WIRE EMERALD ST .035X260CM (WIRE) ×2 IMPLANT

## 2016-02-20 NOTE — Discharge Instructions (Signed)

## 2016-02-20 NOTE — Interval H&P Note (Signed)
History and Physical Interval Note:  02/20/2016 7:39 AM  Todd Frank  has presented today for surgery, with the diagnosis of aortic stenosis abn stress  The various methods of treatment have been discussed with the patient and family. After consideration of risks, benefits and other options for treatment, the patient has consented to  Procedure(s): Right/Left Heart Cath and Coronary Angiography (N/A) as a surgical intervention .  The pCath Lab Visit (complete for each Cath Lab visit)  Clinical Evaluation Leading to the Procedure:   ACS: No.  Non-ACS:    Anginal Classification: CCS II  Anti-ischemic medical therapy: No Therapy  Non-Invasive Test Results: Low-risk stress test findings: cardiac mortality <1%/year  Prior CABG: No previous CABG      atient's history has been reviewed, patient examined, no change in status, stable for surgery.  I have reviewed the patient's chart and labs.  Questions were answered to the patient's satisfaction.     Belva Crome III

## 2016-02-20 NOTE — H&P (View-Only) (Signed)
Cardiology Office Note    Date:  02/15/2016  ID:  Frank, Todd [birth year], MRN [id number] PCP:  Nobie Putnam, DO  Cardiologist:  [name]   Chief Complaint: discuss cath  History of Present Illness:  Todd Frank is a 60 y.o. male with history of HTN, hyperlipidemia, bicuspid aortic valve with AS, diastolic dysfunction, dilated ascending aorta, small PFO, bradycardia, obesity who presents for follow-up of abnormal testing. He had pre-syncope/possible syncope earlier this year. In October he emailed [name] to inquire about pre-op clearance for a synovial cyst on his lumbar spine, and stress test + nuc were recommended prior to clearance. Nuc 01/31/16 showed  prior basal inferolateral infarct and very mild peri-infarct ischemia; EF 53 with hypokinesis of the basal inferolateral wall; mild LVE. 2D Echo 02/13/16 showed mild LVH, EF 55-60%, grade 1 DD, moderate-severe aortic stenosis, dilated ascending aorta 62mm, normal RV, mild RAE. [name] recommended he proceed with [facility] to further evaluate these findings given his pre-syncope earlier this year. Last labs 01/2016 showed Cr 0.84; Hgb 15.1 and plt 125 in 08/2015.  He returns back to clinic with his wife to discuss cath. He exercises 6 days a week without any functional limitation. No CP or SOB. No palpitations. No recurrent syncope. He does report intermittent episodes of random lightheadedness that occurs without any pattern. It feels like sinus pressure across his head. His wife is a International aid/development worker. They have a lot of questions about cardiac cath, stenting, collaterals, etc that I tried my best to answer.    Past Medical History:  Diagnosis Date  . Aortic stenosis    a. mod-severe by echo 02/2016.  . Bicuspid aortic valve   . Bradycardia 07/23/2015  . Congenital nevus   . Diastolic dysfunction   . Dilated aortic root ([place])   . GERD (gastroesophageal reflux disease)   . HTN (hypertension)   . Hyperlipidemia   . LVH  (left ventricular hypertrophy)   . Melanoma ([place])   . Melanoma of skin ([place])   . Obesity   . PFO (patent foramen ovale)    small by echo 2014    Past Surgical History:  Procedure Laterality Date  . EYE SURGERY    . MELANOMA EXCISION  2011   left thigh  . TONSILLECTOMY      Current Medications: Current Outpatient Prescriptions  Medication Sig Dispense Refill  . aspirin 81 MG tablet Take 81 mg by mouth daily.    Marland Kitchen FEVERFEW PO Take 1 capsule by mouth daily.    . fish oil-omega-3 fatty acids 1000 MG capsule Take 1 g by mouth daily.    . fluticasone (FLONASE) 50 MCG/ACT nasal spray Place 2 sprays into both nostrils daily. 16 g 12  . Glucosamine-Vitamin D (GLUCOSAMINE PLUS VITAMIN D) 1500-200 MG-UNIT TABS Take 2 tablets by mouth daily.    . Turmeric Curcumin 500 MG CAPS Take 1 capsule by mouth 2 (two) times daily.    . vitamin B-12 (CYANOCOBALAMIN) 1000 MCG tablet Take 1,000 mcg by mouth daily.     No current facility-administered medications for this visit.      Allergies:   Patient has no known allergies.   Social History   Social History  . Marital status: Married    Spouse name: N/A  . Number of children: N/A  . Years of education: N/A   Occupational History  . Government social research officer - [employer]    Social History Main Topics  . Smoking status: Former  Smoker  . Smokeless tobacco: Never Used  . Alcohol use 0.6 oz/week    1 Cans of beer per week  . Drug use: No  . Sexual activity: Yes   Other Topics Concern  . None   Social History Narrative  . None     Family History:  The patient's family history includes Arthritis in his father; Diabetes in his mother; Heart disease in his paternal grandmother.   ROS:   Please see the history of present illness.  All other systems are reviewed and otherwise negative.    PHYSICAL EXAM:   VS:  BP 138/90 (BP Location: Left Arm, Patient Position: Sitting, Cuff Size: Normal)   Pulse 64   Ht 5' 8.2" (1.732 m)   Wt 176 lb (79.8 kg)    BMI 26.60 kg/m   BMI: Body mass index is 26.6 kg/m. GEN: Well nourished, well developed WM, in no acute distress  HEENT: normocephalic, atraumatic Neck: no JVD or masses. + loud bilateral carotid bruits. Cardiac: RRR; 2/6 SEM at RUSB with preserved S2. No rubs or gallops, no edema  Respiratory:  clear to auscultation bilaterally, normal work of breathing GI: soft, nontender, nondistended, + BS MS: no deformity or atrophy  Skin: warm and dry, no rash Neuro:  Alert and Oriented x 3, Strength and sensation are intact, follows commands Psych: euthymic mood, full affect  Wt Readings from Last 3 Encounters:  02/15/16 176 lb (79.8 kg)  02/04/16 175 lb (79.4 kg)  01/31/16 179 lb (81.2 kg)      Studies/Labs Reviewed:   EKG:   EKG was not ordered today.  Recent Labs: 08/07/2015: Hemoglobin 15.1; Platelets 125 09/07/2015: ALT 15 01/14/2016: BUN 13; Creat 0.84; Potassium 4.1; Sodium 140   Lipid Panel    Component Value Date/Time   CHOL 174 01/29/2016 0838   CHOL 173 11/02/2015   TRIG 64 01/29/2016 0838   TRIG 69 11/02/2015   HDL 60 01/29/2016 0838   CHOLHDL 3.1 11/02/2015   VLDL 14 11/02/2015   LDLCALC 101 (H) 01/29/2016 0838   LDLCALC 104 (A) 11/02/2015    Additional studies/ records that were reviewed today include: Summarized above.    ASSESSMENT & PLAN:   1. Bicuspid AV with progress aortic stenosis and abnormal stress test - reviewed with [name]. Given the progression in AS by his echo and history of presyncope, she recommends R/LHC. Reviewed in depth with patient. Risks and benefits of cardiac catheterization have been discussed with the patient. These include bleeding, infection, kidney damage, stroke, heart attack, death. The patient understands these risks and is willing to proceed. Will check pre-cath labs today. 2. Presyncope - see above - for eval with R/LHC. No recent recurrence. 3. Essential HTN - with h/o lightheadedness, will not make any changes  today. 4. Hyperlipidemia - if CAD is present would recommend initiation of statin. LDL in October was 101. 5. Carotid bruits - I suspect this is related to his aortic stenosis but will obtain carotid duplex to exclude carotid stenosis.  Disposition: F/u with me 2-3 weeks after heart cath.   Medication Adjustments/Labs and Tests Ordered: Current medicines are reviewed at length with the patient today.  Concerns regarding medicines are outlined above. Medication changes, Labs and Tests ordered today are summarized above and listed in the Patient Instructions accessible in Encounters.   Raechel Ache PA-C  02/15/2016 2:18 PM    [facility] [place], [place], [place]  [address] Phone: [phone])  [phone]; Fax: [phone]

## 2016-02-22 ENCOUNTER — Encounter: Payer: Self-pay | Admitting: Cardiology

## 2016-02-26 ENCOUNTER — Encounter: Payer: Self-pay | Admitting: Cardiology

## 2016-02-26 ENCOUNTER — Ambulatory Visit (HOSPITAL_COMMUNITY): Payer: 59

## 2016-02-26 ENCOUNTER — Ambulatory Visit (INDEPENDENT_AMBULATORY_CARE_PROVIDER_SITE_OTHER): Payer: 59 | Admitting: Cardiology

## 2016-02-26 VITALS — BP 126/82 | HR 64 | Ht 69.0 in | Wt 173.0 lb

## 2016-02-26 DIAGNOSIS — E7800 Pure hypercholesterolemia, unspecified: Secondary | ICD-10-CM

## 2016-02-26 MED ORDER — ISOSORBIDE MONONITRATE ER 30 MG PO TB24: 30.0000 mg | ORAL_TABLET | Freq: Every day | ORAL | 11 refills | Status: DC

## 2016-02-26 MED ORDER — ATORVASTATIN CALCIUM 80 MG PO TABS: 80.0000 mg | ORAL_TABLET | Freq: Every day | ORAL | 2 refills | Status: DC

## 2016-02-26 NOTE — Patient Instructions (Signed)
Medication Instructions:  Your physician has recommended you make the following change in your medication:  1) START Lipitor 80mg  daily. An Rx has been sent to your pharmacy 2) START Imdur 15mg  (1/2 tablet) daily. An Rx has been sent to your pharmacy   Labwork: Your physician recommends that you return for a FASTING lipid profile and hepatic in 6 weeks  Testing/Procedures: None ordered  Follow-Up: Your physician recommends that you schedule a follow-up appointment in: 6 weeks with the pharmacist for medication management  Your physician recommends that you schedule a follow-up appointment in: 3 months wit Dr.[name]    Any Other Special Instructions Will Be Listed Below (If Applicable).     If you need a refill on your cardiac medications before your next appointment, please call your pharmacy.

## 2016-02-26 NOTE — Progress Notes (Signed)
02/26/2016 [place]   [birth year]  [id number]  Primary Physician Nobie Putnam, DO Primary Cardiologist: [name]    Reason for Visit/CC: [facility] f/u for CAD and Aortic Stenosis   HPI:  Mr. Donabedian is a 60 year old male, followed by [name], with a history of hypertension, hyperlipidemia, bicuspid aortic valve with aortic stenosis, diastolic dysfunction, dilated ascending aorta, small PFO and bradycardia. In October, he emailed [name] to inquire about preop clearance for a synovial cyst on his lumbar spine. Subsequently it was recommended that he undergo a nuclear stress test. This was an abnormal stress test showing basal inferior lateral infarct with very mild peri-infarct ischemia. EF was 53% with hypokinesis of the basal inferolateral wall. 2-D echocardiogram was also obtained on 02/13/2016 showing mild left ventricular hypertrophy, EF of 0000000, grade 1 diastolic dysfunction with moderate to severe aortic stenosis and a dilated ascending aorta measuring at 41 mm. [name] recommended that we proceed with right and left heart catheterization to further evaluate these findings.  His heart catheterization was performed by Dr. Tamala Julian on 02/20/2016. There was total occlusion of the distal RCA supplied by left circumflex collaterals. There is 70 % stenosis in the PDA on the mid inferior wall. There is also segmentally severe diffuse disease in the mid to distal LAD. The LAD territory is relatively small compared to typical. The LAD does not reach the left ventricular apex. He was also noted to have moderate first diagonal and circumflex disease. Assessment of his aortic valve via left heart catheterization revealed mild to moderate aortic stenosis. The valve is heavily calcified with restricted motion. Left ventricle ejection fraction was estimated to be normal at 60%. Given the diffuse nature of disease in the LAD, Dr. Tamala Julian recommended medical therapy rather than  intervention. He recommended aggressive risk factor modification for CAD including a target LDL less than 70. He also recommended the initiation of anti-ischemic therapy in the form of beta blocker and perhaps long-acting nitrates. Dr. Tamala Julian suggested, should the patient become symptomatic, this area could be treated but would essentially be a full metal jacket from the distal LAD back to the mid segment and prevent future surgical revascularization. Based on these findings, Dr. Tamala Julian also outlined in his cath report that he felt the patient could be cleared for his upcoming surgery. He felt that he would be at least intermediate risk.  He presents back to clinic today for post hospital follow-up. He is here with his wife. He reports that has done well since discharge from the hospital. He denies any anginal symptomatology. No exertional chest pain and no dyspnea. No syncope/near-syncope. His cath site is well-healed without complications. He has a 2+ right radial pulse. He only appears to be on aspirin currently. He also takes fish oil. He is not currently on a statin. Fasting lipid panel in October showed an LDL of 101 mg/dL. His blood pressure is well-controlled at 126/82. Resting heart rate is 64 bpm.  Current Meds  Medication Sig  . aspirin 81 MG tablet Take 81 mg by mouth daily.  Marland Kitchen FEVERFEW PO Take 1 capsule by mouth daily.  . fish oil-omega-3 fatty acids 1000 MG capsule Take 1 g by mouth daily.  . fluticasone (FLONASE) 50 MCG/ACT nasal spray Place 2 sprays into both nostrils daily.  . Glucosamine-Vitamin D (GLUCOSAMINE PLUS VITAMIN D) 1500-200 MG-UNIT TABS Take 2 tablets by mouth daily.  . sodium chloride (OCEAN) 0.65 % SOLN nasal spray Place 1 spray into both  nostrils as needed for congestion.  . Turmeric Curcumin 500 MG CAPS Take 1 capsule by mouth daily.   . vitamin B-12 (CYANOCOBALAMIN) 1000 MCG tablet Take 1,000 mcg by mouth daily.   No Known Allergies Past Medical History:  Diagnosis  Date  . Aortic stenosis    a. mod-severe by echo 02/2016.  . Bicuspid aortic valve   . Bradycardia 07/23/2015  . Congenital nevus   . Diastolic dysfunction   . Dilated aortic root ([place])   . GERD (gastroesophageal reflux disease)   . HTN (hypertension)   . Hyperlipidemia   . LVH (left ventricular hypertrophy)   . Melanoma ([place])   . Melanoma of skin ([place])   . Obesity   . PFO (patent foramen ovale)    small by echo 2014   Family History  Problem Relation Age of Onset  . Diabetes Mother   . Arthritis Father   . Heart disease Paternal Grandmother     MI  . Heart attack      family history   Past Surgical History:  Procedure Laterality Date  . CARDIAC CATHETERIZATION N/A 02/20/2016   Procedure: Right/Left Heart Cath and Coronary Angiography;  Surgeon: Belva Crome, MD;  Location: [place] [facility];  Service: Cardiovascular;  Laterality: N/A;  . EYE SURGERY    . MELANOMA EXCISION  2011   left thigh  . TONSILLECTOMY     Social History   Social History  . Marital status: Married    Spouse name: N/A  . Number of children: N/A  . Years of education: N/A   Occupational History  . Government social research officer - [employer]    Social History Main Topics  . Smoking status: Former Research scientist (life sciences)  . Smokeless tobacco: Never Used  . Alcohol use 0.6 oz/week    1 Cans of beer per week  . Drug use: No  . Sexual activity: Yes   Other Topics Concern  . Not on file   Social History Narrative  . No narrative on file     Review of Systems: General: negative for chills, fever, night sweats or weight changes.  Cardiovascular: negative for chest pain, dyspnea on exertion, edema, orthopnea, palpitations, paroxysmal nocturnal dyspnea or shortness of breath Dermatological: negative for rash Respiratory: negative for cough or wheezing Urologic: negative for hematuria Abdominal: negative for nausea, vomiting, diarrhea, bright red blood per rectum, melena, or hematemesis Neurologic: negative for visual  changes, syncope, or dizziness All other systems reviewed and are otherwise negative except as noted above.   Physical Exam:  Blood pressure 126/82, pulse 64, height 5\' 9"  (1.753 m), weight 173 lb (78.5 kg), SpO2 98 %.  General appearance: alert, cooperative and no distress Neck: ? left carotid bruit vs radiation of AS murmur.  no JVD Lungs: clear to auscultation bilaterally Heart: regular rate and rhythm, S1, S2 normal, 2/6 AS murmur, click, rub or gallop Extremities: extremities normal, atraumatic, no cyanosis or edema Pulses: 2+ and symmetric Skin: Skin color, texture, turgor normal. No rashes or lesions Neurologic: Grossly normal  EKG not performed   [facility] 02/20/16  Total occlusion of the distal RCA supplied by left circumflex collaterals. 70% stenosis in the PDA on the mid inferior wall  Segmentally severe, diffuse disease in the mid to distal LAD. LAD territory is relatively small compared to typical. The LAD does not reach the left ventricular apex.  Moderate first diagonal, and circumflex disease.  Mild to moderate aortic stenosis. Findings are similar to those obtained at echo.  The valve is heavily calcified with restricted motion.  Normal pulmonary artery pressures. Normal to low pulmonary capillary wedge.  Normal left ventricular function with EF 60%.   ASSESSMENT AND PLAN:   1. CAD: angiographic details of recent cath + recommendations from Dr. Tamala Julian outlined in cath report. Plan is for medical therapy. He denies any anginal symptomatology. If he develops chest pain, despite medical therapy, can revisit the idea of PCI to his LAD. For now, we will continue ASA. Will add high intensity statin, Lipitor 80 mg. His baseline HR will not tolerate addition of BB. We will add low dose long acting nitrate, Imdur 15 mg. He denies use of meds for ED. Will start with low dose to prevent hypotension. F/u in 4-6 weeks with pharmacist for further titration, if BP will tolerate.   2.  HLD: recent FLP showed an LDL of 101 mg/dL. Given the severity of his CAD, LDL goal of < 70 recommended. Will add Lipitor 80 mg. Repeat FLP + HFTs in 6 weeks.   3. ? Left carotid bruit vs radiation of cardiac murmur: pt is scheduled for carotid dopplers. Patient encouraged to keep this appt.   4. Aortic Stenosis: has a bicuspid aortic valve with dilated aortic root of 41 mm. Based on recent LHC, his AS appears to be mild-moderate. He is asymptomatic. This will be followed closely by [name].   PLAN  F/u in 4-6 weeks with pharmacist. F/u in 3 months with [name].   Lyda Jester PA-C 02/26/2016 3:13 PM

## 2016-03-04 ENCOUNTER — Encounter: Payer: Self-pay | Admitting: Cardiology

## 2016-03-05 ENCOUNTER — Encounter: Payer: Self-pay | Admitting: Cardiology

## 2016-03-10 NOTE — Telephone Encounter (Signed)
This encounter was created in error - please disregard.

## 2016-03-12 ENCOUNTER — Ambulatory Visit: Payer: 59

## 2016-03-12 DIAGNOSIS — R0989 Other specified symptoms and signs involving the circulatory and respiratory systems: Secondary | ICD-10-CM

## 2016-03-18 NOTE — Telephone Encounter (Signed)
Do you remember if you guys sent surgical clearance request? This is the patient you were going to send Dr. Darliss Ridgel' cath report and recs to [name]. Dr. Tamala Julian felt that he could be cleared for surgery based on results.

## 2016-04-04 ENCOUNTER — Encounter: Payer: Self-pay | Admitting: Cardiology

## 2016-04-09 ENCOUNTER — Other Ambulatory Visit: Payer: Self-pay | Admitting: Cardiology

## 2016-04-10 LAB — HEPATIC FUNCTION PANEL
ALT: 27 IU/L (ref 0–44)
AST: 24 IU/L (ref 0–40)
Albumin: 4.3 g/dL (ref 3.6–4.8)
Alkaline Phosphatase: 85 IU/L (ref 39–117)
BILIRUBIN TOTAL: 1.1 mg/dL (ref 0.0–1.2)
Bilirubin, Direct: 0.29 mg/dL (ref 0.00–0.40)
TOTAL PROTEIN: 6.3 g/dL (ref 6.0–8.5)

## 2016-04-10 LAB — LIPID PANEL WITH LDL/HDL RATIO
Cholesterol, Total: 110 mg/dL (ref 100–199)
HDL: 56 mg/dL (ref >39)
LDL CALC: 42 mg/dL (ref 0–99)
LDL/HDL RATIO: 0.8 ratio (ref 0.0–3.6)
Triglycerides: 59 mg/dL (ref 0–149)
VLDL CHOLESTEROL CAL: 12 mg/dL (ref 5–40)

## 2016-04-11 ENCOUNTER — Ambulatory Visit: Payer: 59

## 2016-05-02 ENCOUNTER — Encounter: Payer: Self-pay | Admitting: Cardiology

## 2016-05-25 ENCOUNTER — Other Ambulatory Visit: Payer: Self-pay | Admitting: Cardiology

## 2016-05-28 ENCOUNTER — Ambulatory Visit (INDEPENDENT_AMBULATORY_CARE_PROVIDER_SITE_OTHER): Payer: 59 | Admitting: Cardiology

## 2016-05-28 ENCOUNTER — Encounter: Payer: Self-pay | Admitting: Cardiology

## 2016-05-28 VITALS — BP 132/80 | HR 65 | Ht 69.0 in | Wt 182.8 lb

## 2016-05-28 DIAGNOSIS — I350 Nonrheumatic aortic (valve) stenosis: Secondary | ICD-10-CM

## 2016-05-28 DIAGNOSIS — Q2112 Patent foramen ovale: Secondary | ICD-10-CM

## 2016-05-28 DIAGNOSIS — I77810 Thoracic aortic ectasia: Secondary | ICD-10-CM | POA: Diagnosis not present

## 2016-05-28 DIAGNOSIS — R002 Palpitations: Secondary | ICD-10-CM

## 2016-05-28 DIAGNOSIS — I2510 Atherosclerotic heart disease of native coronary artery without angina pectoris: Secondary | ICD-10-CM

## 2016-05-28 DIAGNOSIS — I10 Essential (primary) hypertension: Secondary | ICD-10-CM

## 2016-05-28 DIAGNOSIS — Q211 Atrial septal defect: Secondary | ICD-10-CM | POA: Diagnosis not present

## 2016-05-28 DIAGNOSIS — R0989 Other specified symptoms and signs involving the circulatory and respiratory systems: Secondary | ICD-10-CM

## 2016-05-28 DIAGNOSIS — E7800 Pure hypercholesterolemia, unspecified: Secondary | ICD-10-CM

## 2016-05-28 MED ORDER — AMLODIPINE BESYLATE 2.5 MG PO TABS: 2.5000 mg | ORAL_TABLET | Freq: Every day | ORAL | 11 refills | Status: DC

## 2016-05-28 MED ORDER — ATORVASTATIN CALCIUM 80 MG PO TABS: 80.0000 mg | ORAL_TABLET | Freq: Every day | ORAL | 3 refills | Status: DC

## 2016-05-28 NOTE — Progress Notes (Signed)
Cardiology Office Note    Date:  05/28/2016   ID:  Rithwik, Lorenzano 1956-01-25, MRN [id number]  PCP:  Nobie Putnam, DO  Cardiologist:  [name], MD   Chief Complaint  Patient presents with  . Coronary Artery Disease  . Hypertension  . Hyperlipidemia  . Aortic Stenosis    History of Present Illness:  Todd Frank is a 61 y.o. male with a history of HTN, dyslipidemia, moderate AS (by cath 02/2016) with bicuspid AV, diastolic dysfunction, mildly dilated aortic root (6mm by echo 02/2016) and small PFO. He underwent cath 02/2016 for workup of AS and preoperative cardiac clearance and cath showed normal LVF, moderate AS, occluded distal RCA supplied by LCx collaterals, 40% prox RCA, 80% acute marginal, 60% OM3, 90% mid to distal LAD (does not reach the apex and small), 60% D1, 50% OM1, 60% mid LAD, 70% PDA - medical management was felt to be best option.  He is doing well. He denies any exertional chest pain, SOB, DOE, LE edema or syncope. He denies any PND or orthopnea.  He has a pressure on his chest that he has had for years that never occurs with exertion and he says that it has actually gotten better.  He describes a lightheadedness and dizziness with pressure around his eyes with a headache since starting Imdur.  He has a history of white coat HTN and runs normal at home. He works out daily at [employer] on the elliptical and treadmill without any problems. He has been having skipped beats that last throughout the day.     Past Medical History:  Diagnosis Date  . Aortic stenosis    a. mod-severe by echo 02/2016 but moderate by cath 02/2016  . Bicuspid aortic valve   . Bradycardia 07/23/2015  . CAD (coronary artery disease), native coronary artery    cath 02/2016 showing  Normal LVF, moderate AS, occluded distal RCA supplied by LCx collaterals, 40% prox RCA, 80% acute marginal, 60% OM3, 90% mid to distal LAD (does not reach the apex and small), 60% D1, 50% OM1, 60% mid  LAD, 70% PDA - medical management was felt to be best option.    . Carotid bruit    carotid dopplers negative.  Bruit due to heart murmur from AS  . Congenital nevus   . Diastolic dysfunction   . Dilated aortic root ([place])    64mm by echo 02/2016  . GERD (gastroesophageal reflux disease)   . HTN (hypertension)   . Hyperlipidemia    LDL goal < 70  . LVH (left ventricular hypertrophy)   . Melanoma of skin ([place])   . Obesity   . PFO (patent foramen ovale)    small by echo 2014    Past Surgical History:  Procedure Laterality Date  . CARDIAC CATHETERIZATION N/A 02/20/2016   Procedure: Right/Left Heart Cath and Coronary Angiography;  Surgeon: Belva Crome, MD;  Location: [facility];  Service: Cardiovascular;  Laterality: N/A;  . EYE SURGERY    . MELANOMA EXCISION  2011   left thigh  . TONSILLECTOMY      Current Medications: Current Meds  Medication Sig  . aspirin 81 MG tablet Take 81 mg by mouth daily.  Marland Kitchen atorvastatin (LIPITOR) 80 MG tablet take 1 tablet by mouth once daily  . Coenzyme Q10 (CO Q 10 PO) Take by mouth daily.  . fish oil-omega-3 fatty acids 1000 MG capsule Take 2,400 mg by mouth daily.   Marland Kitchen  fluticasone (FLONASE) 50 MCG/ACT nasal spray Place 2 sprays into both nostrils daily.  . isosorbide mononitrate (IMDUR) 30 MG 24 hr tablet Take 1 tablet (30 mg total) by mouth daily.    Allergies:   Patient has no known allergies.   Social History   Social History  . Marital status: Married    Spouse name: N/A  . Number of children: N/A  . Years of education: N/A   Occupational History  . Government social research officer - [employer]    Social History Main Topics  . Smoking status: Former Research scientist (life sciences)  . Smokeless tobacco: Never Used  . Alcohol use 0.6 oz/week    1 Cans of beer per week  . Drug use: No  . Sexual activity: Yes   Other Topics Concern  . None   Social History Narrative  . None     Family History:  The patient's family history includes Arthritis in his father;  Diabetes in his mother; Heart disease in his paternal grandmother.   ROS:   Please see the history of present illness.    ROS All other systems reviewed and are negative.  No flowsheet data found.     PHYSICAL EXAM:   VS:  BP 132/80   Pulse 65   Ht 5\' 9"  (1.753 m)   Wt 182 lb 12.8 oz (82.9 kg)   SpO2 99%   BMI 26.99 kg/m    GEN: Well nourished, well developed, in no acute distress  HEENT: normal  Neck: no JVD, carotid bruits, or masses Cardiac: RRR; no rubs, or gallops,no edema.  Intact distal pulses bilaterally. 2/6 SM at RUSB to LLSB and into carotids bilaterally Respiratory:  clear to auscultation bilaterally, normal work of breathing GI: soft, nontender, nondistended, + BS MS: no deformity or atrophy  Skin: warm and dry, no rash Neuro:  Alert and Oriented x 3, Strength and sensation are intact Psych: euthymic mood, full affect  Wt Readings from Last 3 Encounters:  05/28/16 182 lb 12.8 oz (82.9 kg)  02/26/16 173 lb (78.5 kg)  02/20/16 174 lb (78.9 kg)      Studies/Labs Reviewed:   EKG:  EKG is ordered today.  The ekg ordered today demonstrates   Recent Labs: 02/15/2016: BUN 18; Creat 0.84; Hemoglobin 15.0; Platelets 152; Potassium 4.2; Sodium 140 04/09/2016: ALT 27   Lipid Panel    Component Value Date/Time   CHOL 110 04/09/2016 0828   CHOL 173 11/02/2015   TRIG 59 04/09/2016 0828   TRIG 69 11/02/2015   HDL 56 04/09/2016 0828   CHOLHDL 3.1 11/02/2015   VLDL 14 11/02/2015   LDLCALC 42 04/09/2016 0828   LDLCALC 104 (A) 11/02/2015    Additional studies/ records that were reviewed today include:  none    ASSESSMENT:    1. Aortic valve stenosis, etiology of cardiac valve disease unspecified   2. Essential hypertension   3. PFO (patent foramen ovale)   4. Dilated aortic root ([place])   5. Hypercholesteremia   6. Coronary artery disease involving native coronary artery of native heart without angina pectoris   7. Bilateral carotid bruits      PLAN:    In order of problems listed above:  1. Moderate AS by cath 02/2016.  Continue yearly followup with Echo. 02/2017.  He is asymptomatic.   2. HTN - BP controlled on diet alone. 3. PFO - stable 4. Dilated ascending aorta at 89mm.  Repeat echo in 1 year 5. Hyperlipidemia with LDL goal < 70.  His LDL is at goal at 42.  Continue high dose statin. 6. ASCAD with cath 02/2016 showing  Normal LVF, moderate AS, occluded distal RCA supplied by LCx collaterals, 40% prox RCA, 80% acute marginal, 60% OM3, 90% mid to distal LAD (does not reach the apex and small), 60% D1, 50% OM1, 60% mid LAD, 70% PDA - medical management was felt to be best option.  He has not had any anginal symptoms.  He will continue on ASA/high dose statin.  I will stop his Imdur due to side effects and start amlodipine 2.5mg  daily.  I will avoid BB due to borderline bradycardia.  He says that his HR drops into the 50's at home.   7. Palpitations - I will get an event monitor to rule out PAF.      Medication Adjustments/Labs and Tests Ordered: Current medicines are reviewed at length with the patient today.  Concerns regarding medicines are outlined above.  Medication changes, Labs and Tests ordered today are listed in the Patient Instructions below.  There are no Patient Instructions on file for this visit.   Signed, [name], MD  05/28/2016 9:22 AM    [facility] [address], [place], [place]  [address] Phone: [phone]; Fax: [phone]

## 2016-05-28 NOTE — Patient Instructions (Signed)
Medication Instructions:  1) STOP IMDUR 2) START AMLODIPINE 2.5 mg daily  Labwork: Your physician recommends that you return for FASTING lab work in [place].  Testing/Procedures: Your physician has requested that you have an echocardiogram in November, 2018. Echocardiography is a painless test that uses sound waves to create images of your heart. It provides your doctor with information about the size and shape of your heart and how well your heart's chambers and valves are working. This procedure takes approximately one hour. There are no restrictions for this procedure.  Your physician has recommended that you wear an event monitor. Event monitors are medical devices that record the heart's electrical activity. Doctors most often [place] these monitors to diagnose arrhythmias. Arrhythmias are problems with the speed or rhythm of the heartbeat. The monitor is a small, portable device. You can wear one while you do your normal daily activities. This is usually used to diagnose what is causing palpitations/syncope (passing out).  Follow-Up: Your physician wants you to follow-up in: 6 months with [name]. You will receive a reminder letter in the mail two months in advance. If you don't receive a letter, please call our office to schedule the follow-up appointment.   Any Other Special Instructions Will Be Listed Below (If Applicable).     If you need a refill on your cardiac medications before your next appointment, please call your pharmacy.

## 2016-06-03 ENCOUNTER — Ambulatory Visit (INDEPENDENT_AMBULATORY_CARE_PROVIDER_SITE_OTHER): Payer: 59

## 2016-06-03 DIAGNOSIS — R002 Palpitations: Secondary | ICD-10-CM

## 2016-06-23 ENCOUNTER — Encounter: Payer: Self-pay | Admitting: Cardiology

## 2016-06-23 ENCOUNTER — Other Ambulatory Visit: Payer: Self-pay | Admitting: Cardiology

## 2016-06-23 MED ORDER — ATORVASTATIN CALCIUM 80 MG PO TABS: 80.0000 mg | ORAL_TABLET | Freq: Every day | ORAL | 3 refills | Status: DC

## 2016-07-09 ENCOUNTER — Encounter: Payer: Self-pay | Admitting: Cardiology

## 2016-07-09 ENCOUNTER — Other Ambulatory Visit: Payer: Self-pay

## 2016-07-09 DIAGNOSIS — R002 Palpitations: Secondary | ICD-10-CM

## 2016-07-15 LAB — TSH: TSH: 2.13 (ref <=5.90)

## 2016-07-15 LAB — MAGNESIUM: MAGNESIUM: 2.2

## 2016-07-28 ENCOUNTER — Other Ambulatory Visit: Payer: Self-pay | Admitting: *Deleted

## 2016-07-28 MED ORDER — AMLODIPINE BESYLATE 2.5 MG PO TABS: 2.5000 mg | ORAL_TABLET | Freq: Every day | ORAL | 2 refills | Status: DC

## 2016-07-31 ENCOUNTER — Encounter: Payer: Self-pay | Admitting: Family Medicine

## 2016-07-31 ENCOUNTER — Ambulatory Visit (INDEPENDENT_AMBULATORY_CARE_PROVIDER_SITE_OTHER): Payer: 59 | Admitting: Family Medicine

## 2016-07-31 VITALS — BP 126/78 | HR 77 | Temp 98.5°F | Resp 16 | Ht 69.0 in | Wt 187.0 lb

## 2016-07-31 DIAGNOSIS — H8110 Benign paroxysmal vertigo, unspecified ear: Secondary | ICD-10-CM

## 2016-07-31 NOTE — Progress Notes (Signed)
Subjective:    Patient ID: Todd Frank, male    DOB: 26-Aug-1955, 61 y.o.   MRN: [id number]  Todd Frank is a 61 y.o. male presenting on 07/31/2016 for Dizziness (onset Monday night only but light Headache and has eye pressure onset 6 month)   HPI   VERTIGO / LIGHTHEADEDNESS / ALLERGIC RHINITIS: Chronic problem, described lightheadedness, sinus/ear pressure, does not describe any distinct dizziness or loss of balance. In past he has seen Kathrine Haddock NP at [facility], 2 years ago in 2016, was given Flonase with improvement but not resolution of problem. Then recently within past 1 year saw [facility], no significant change to treatment at that time. He had seen ENT for chronic otitis externa in past, uses drops of alcohol in ear to dry if gets moisture. - Now presenting to office today with new concern of recent episode 3 days ago Monday night, he woke up to go to bathroom, he stood up, felt dizzy and room spinning, did not fall or feel like he would fall, he sat down and rested with improvement. Describes that if he could provoke the vertigo and room spinning with head movements. Next day he had no more vertigo, but still felt mild residual lightheadedness. Since then he has gradually returned to normal including work and exercise at [facility] without any problems. - No prior known history of vertigo, this was new episode - Denies any headache, vision changes, active dizziness or vertigo, nausea, vomiting, numbness, tingling  Additional information: - He reports he has done well s/p back surgery, now his back and leg pain is mostly resolved. - Also he continues to follow-up with Cardiology, he is asking about statin and LDL today, has concerns about atorvastatin 80mg  being too high of a dose for him, and is asking my opinion on lower doses, he plans to discuss further with his Cardiologist   Social History  Substance Use Topics  . Smoking status: Former Research scientist (life sciences)  . Smokeless  tobacco: Never Used  . Alcohol use 0.6 oz/week    1 Cans of beer per week    Review of Systems Per HPI unless specifically indicated above     Objective:    BP 126/78   Pulse 77   Temp 98.5 F (36.9 C) (Oral)   Resp 16   Ht 5\' 9"  (1.753 m)   Wt 187 lb (84.8 kg)   BMI 27.62 kg/m   Wt Readings from Last 3 Encounters:  07/31/16 187 lb (84.8 kg)  05/28/16 182 lb 12.8 oz (82.9 kg)  02/26/16 173 lb (78.5 kg)    Physical Exam  Constitutional: He is oriented to person, place, and time. He appears well-developed and well-nourished. No distress.  Well-appearing, comfortable, cooperative, pleasant  HENT:  Head: Normocephalic.  Mouth/Throat: Oropharynx is clear and moist.  Frontal / maxillary sinuses non-tender. Nares mostly patent with deeper turbinate edema without congestion or  purulence.   R ear canal with some dry cerumen non obstructing. R TM clear with some old residual scarring. Unable to visualize significant effusion, no erythema or bulging.  L ear canal some mild debris without cerumen. Evidence of scarring on TM as well, with some clear effusion without purulence, erythema or bulging.  Dix-Hallpike maneuver performed negative bilateral for nystagmus or reproduced vertigo. Modified Dix-Hallpike done for Side Lying Test - mild positive minimal vertigo without nystagmus on with R head rotation and lying on Left side  Eyes: Conjunctivae and EOM are normal. Pupils  are equal, round, and reactive to light. Right eye exhibits no discharge. Left eye exhibits no discharge.  Neck: Normal range of motion. Neck supple.  Cardiovascular: Normal rate, regular rhythm and intact distal pulses.   Murmur (2/6 systolic murmur loudest R sternal border, radiating to carotids) heard. Pulmonary/Chest: Effort normal and breath sounds normal. No respiratory distress. He has no wheezes. He has no rales.  Lymphadenopathy:    He has no cervical adenopathy.  Neurological: He is alert and oriented to  person, place, and time.  Skin: Skin is warm and dry. No rash noted. He is not diaphoretic. No erythema.  Psychiatric: He has a normal mood and affect. His behavior is normal.  Nursing note and vitals reviewed.  Results for orders placed or performed in visit on 04/09/16  Lipid Panel With LDL/HDL Ratio  Result Value Ref Range   Cholesterol, Total 110 100 - 199 mg/dL   Triglycerides 59 0 - 149 mg/dL   HDL 56 >39 mg/dL   VLDL Cholesterol Cal 12 5 - 40 mg/dL   LDL Calculated 42 0 - 99 mg/dL   LDl/HDL Ratio 0.8 0.0 - 3.6 ratio units  Hepatic function panel  Result Value Ref Range   Total Protein 6.3 6.0 - 8.5 g/dL   Albumin 4.3 3.6 - 4.8 g/dL   Bilirubin Total 1.1 0.0 - 1.2 mg/dL   Bilirubin, Direct 0.29 0.00 - 0.40 mg/dL   Alkaline Phosphatase 85 39 - 117 IU/L   AST 24 0 - 40 IU/L   ALT 27 0 - 44 IU/L      Assessment & Plan:   Problem List Items Addressed This Visit    Benign paroxysmal positional vertigo - Primary    Suspected BPPV (possibly R) by history. Exam is not entirely supportive with negative standard Dix-Hallpike, however side lying test on L side with head to R is mildly positive - No other significant neurological findings or focal deficits - Concern with some chronic sinusitis symptoms as well, may be contributing, have improved on Flonase  Plan: 1. Discussion on possible etiologies, and still suspect BPPV is likely. Handout given with Epley maneuver TID for 1-2 weeks until resolved 2. May try OTC meclizine PRN for breakthrough symptoms - advised less likely to be effective for his problem 3. Advised him to contact [facility] since already established and request new apt, if needs new referral will contact us, he would benefit from more advanced vestibular testing to determine more source of his problem and may also need more detailed sinus evaluation, consider vestibular therapy if needed, can do outpatient [facility] as well [facility], vestibular)           Follow up plan: Return in about 4 weeks (around 08/28/2016), or if symptoms worsen or fail to improve.  Nobie Putnam, [facility] 07/31/2016, 2:59 PM

## 2016-07-31 NOTE — Assessment & Plan Note (Signed)
Suspected BPPV (possibly R) by history. Exam is not entirely supportive with negative standard Dix-Hallpike, however side lying test on L side with head to R is mildly positive - No other significant neurological findings or focal deficits - Concern with some chronic sinusitis symptoms as well, may be contributing, have improved on Flonase  Plan: 1. Discussion on possible etiologies, and still suspect BPPV is likely. Handout given with Epley maneuver TID for 1-2 weeks until resolved 2. May try OTC meclizine PRN for breakthrough symptoms - advised less likely to be effective for his problem 3. Advised him to contact [facility] since already established and request new apt, if needs new referral will contact us, he would benefit from more advanced vestibular testing to determine more source of his problem and may also need more detailed sinus evaluation, consider vestibular therapy if needed, can do outpatient [facility] as well [facility], vestibular)

## 2016-07-31 NOTE — Patient Instructions (Signed)
Thank you for coming in to clinic today.  1. You have symptoms of Vertigo (Benign Paroxysmal Positional Vertigo) - This is commonly caused by inner ear fluid imbalance, sometimes can be worsened by allergies and sinus symptoms, otherwise it can occur randomly sometimes and we may never discover the exact cause.  - To treat this, try the Epley Manuever (see diagrams/instructions below) at home up to 3 times a day for 1-2 weeks or until symptoms resolve  - If you really wanted to try a medication, you may take OTC Meclizine as needed up to 3 times a day for dizziness, this will not cure symptoms but may help. Caution may make you drowsy.  If you develop significant worsening episode with vertigo that does not improve and you get severe headache, loss of vision, arm or leg weakness, slurred speech, or other concerning symptoms please seek immediate medical attention at Emergency Department.  Call [facility] to re-schedule follow-up to discuss Vertigo and Inner Ear Inbalance, discuss vestibular testing and vestibular rehab.  Please schedule a follow-up appointment with Dr Parks Ranger as needed if not improving vertigo  See the next page for images describing the Epley Manuever.    ----------------------------------------------------------------------------------------------------------------------       If you have any other questions or concerns, please feel free to call the clinic or send a message through [place]. You may also schedule an earlier appointment if necessary.  Nobie Putnam, DO [place]

## 2016-08-25 ENCOUNTER — Other Ambulatory Visit: Payer: Self-pay

## 2016-08-25 MED ORDER — FLUTICASONE PROPIONATE 50 MCG/ACT NA SUSP: 2.0000 | Freq: Every day | NASAL | 12 refills | Status: DC

## 2016-08-25 NOTE — Telephone Encounter (Signed)
Routing to provider  

## 2016-08-26 ENCOUNTER — Encounter: Payer: Self-pay | Admitting: Family Medicine

## 2016-08-26 DIAGNOSIS — H8110 Benign paroxysmal vertigo, unspecified ear: Secondary | ICD-10-CM

## 2017-02-06 ENCOUNTER — Ambulatory Visit (INDEPENDENT_AMBULATORY_CARE_PROVIDER_SITE_OTHER): Payer: 59 | Admitting: Family Medicine

## 2017-02-06 ENCOUNTER — Encounter: Payer: Self-pay | Admitting: Family Medicine

## 2017-02-06 VITALS — BP 118/80 | HR 70 | Temp 98.7°F | Resp 16 | Ht 69.0 in | Wt 190.6 lb

## 2017-02-06 DIAGNOSIS — Z0000 Encounter for general adult medical examination without abnormal findings: Secondary | ICD-10-CM

## 2017-02-06 DIAGNOSIS — Z125 Encounter for screening for malignant neoplasm of prostate: Secondary | ICD-10-CM | POA: Diagnosis not present

## 2017-02-06 DIAGNOSIS — E7800 Pure hypercholesterolemia, unspecified: Secondary | ICD-10-CM | POA: Diagnosis not present

## 2017-02-06 DIAGNOSIS — I2510 Atherosclerotic heart disease of native coronary artery without angina pectoris: Secondary | ICD-10-CM

## 2017-02-06 DIAGNOSIS — H8110 Benign paroxysmal vertigo, unspecified ear: Secondary | ICD-10-CM

## 2017-02-06 DIAGNOSIS — I10 Essential (primary) hypertension: Secondary | ICD-10-CM | POA: Diagnosis not present

## 2017-02-06 DIAGNOSIS — Z1211 Encounter for screening for malignant neoplasm of colon: Secondary | ICD-10-CM

## 2017-02-06 DIAGNOSIS — I77810 Thoracic aortic ectasia: Secondary | ICD-10-CM

## 2017-02-06 DIAGNOSIS — M5441 Lumbago with sciatica, right side: Secondary | ICD-10-CM

## 2017-02-06 DIAGNOSIS — G8929 Other chronic pain: Secondary | ICD-10-CM

## 2017-02-06 NOTE — Assessment & Plan Note (Addendum)
Stable without known change Followed by [facility] Cardiology surveillance with CT angio and also ECHO monitoring for AS Follow-up as scheduled 02/2017

## 2017-02-06 NOTE — Patient Instructions (Addendum)
Thank you for coming to the clinic today.  1. Consider Repatha in future or other PSK9 inhibitor - Also look into Advanced Lipid Panel testing (with inflammation) - see if this would be an extra cost or if you could get it through [facility] or Cardiology office  Check cost and coverage of new - Shingrix (Recombinant zoster vaccine) - 2 dose series vaccine x 1 and next dose at 2 to 6 months  Colon Cancer Screening: - For all adults age 62+ routine colon cancer screening is highly recommended.     - Recent guidelines from American Cancer Society recommend starting age of 45 - Early detection of colon cancer is important, because often there are no warning signs or symptoms, also if found early usually it can be cured. Late stage is hard to treat.  - If you are not interested in Colonoscopy screening (if done and normal you could be cleared for 5 to 10 years until next due), then Cologuard is an excellent alternative for screening test for Colon Cancer. It is highly sensitive for detecting DNA of colon cancer from even the earliest stages. Also, there is NO bowel prep required. - If Cologuard is NEGATIVE, then it is good for 3 years before next due - If Cologuard is POSITIVE, then it is strongly advised to get a Colonoscopy, which allows the GI doctor to locate the source of the cancer or polyp (even very early stage) and treat it by removing it. ------------------------- If you would like to proceed with Cologuard (stool DNA test) - FIRST, call your insurance company and tell them you want to check cost of Cologuard tell them CPT Code 81528 (it may be completely covered and you could get for no cost, OR max cost without any coverage is about $600). Also, keep in mind if you do NOT open the kit, and decide not to do the test, you will NOT be charged, you should contact the company if you decide not to do the test. - If you want to proceed, you can notify us (phone message, MyChart Message, or at next  visit) and we will order it for you. The test kit will be delivered to you house within about 1 week. Follow instructions to collect sample, you may call the company for any help or questions, 24/7 telephone support at [phone].  DUE for FASTING BLOOD WORK (no food or drink after midnight before the lab appointment, only water or coffee without cream/sugar on the morning of)  [facility]  For Lab Results, once available within 2-3 days of blood draw, you can can log in to MyChart online to view your results and a brief explanation. Also, we can discuss results at next follow-up visit.  Please schedule a Follow-up Appointment to: Return in about 1 year (around 02/06/2018) for Annual Physical.  If you have any other questions or concerns, please feel free to call the clinic or send a message through MyChart. You may also schedule an earlier appointment if necessary.  Additionally, you may be receiving a survey about your experience at our clinic within a few days to 1 week by e-mail or mail. We value your feedback.  Alexander Karamalegos, DO [facility], [facility]  

## 2017-02-06 NOTE — Assessment & Plan Note (Signed)
Dramatic resolution s/p spine surgery removal of cyst lumbar with nerve compression No further concerns, mostly asymptomatic Improve core exercises and activity Follow-up as needed

## 2017-02-06 NOTE — Assessment & Plan Note (Signed)
Well-controlled HTN - Home BP readings controlled  Complication CAD, AS  Plan:  1. Not on anti-HTN therapy - no change 2. Encourage improved lifestyle - low sodium diet, regular exercise 3. Continue monitor BP outside office, bring readings to next visit, if persistently >140/90 or new symptoms notify office sooner 4. Follow-up q 6-12 mo

## 2017-02-06 NOTE — Assessment & Plan Note (Signed)
Stable without angina Followed by [facility] Cardiology Dr Radford Pax Last heart cath 02/2016, followed for moderate AS bicuspid valve with ECHO Continues on medication management

## 2017-02-06 NOTE — Assessment & Plan Note (Signed)
Avg / Low risk patient, with prior reported negative screening - Last PSA 0.9 (02/2016), prior values normal - Last DRE 1-2 years ago reported normal - Clinically asymptomatic  Plan: 1. Reviewed prostate cancer screening guidelines and risks including potential prostate biopsy if abnormal PSA, proceed with yearly PSA for now, and anticipate DRE as needed especially if abnormal PSA or new symptoms

## 2017-02-06 NOTE — Assessment & Plan Note (Signed)
Controlled cholesterol on statin and lifestyle. At goal LDL < 70 Last lipid panel 04/2016 Known CAD  Plan: 1. Continue current meds - Atorvastatin 80mg  daily per Cards - discussed may consider switch to alternative PSK9 inhibitor repatha, likely could get approved with known ASCVD, high risk patient and if concern on statin 2. Continue ASA 81mg  for secondary ASCVD risk reduction 3. Encourage improved lifestyle - low carb/cholesterol, reduce portion size, continue improving regular exercise 4. Check fasting lipids today, then follow-up q 12 months unless needs to discuss sooner - Future consider Advanced Lipid Panel w/ inflammation testing through [facility]

## 2017-02-06 NOTE — Assessment & Plan Note (Signed)
Stable now gradually improved, presumed BPPV uncertain which side Evaluated by [facility] Dr Lucia Gaskins, no new dx or treatment May resume Epley PRN Follow-up if worsening, consider vestibular rehab

## 2017-02-06 NOTE — Assessment & Plan Note (Signed)
Due for routine colon cancer screening. Last colonoscopy 11/2006, good for 10 years by report - Discussion today about recommendations for either Colonoscopy or Cologuard screening, benefits and risks of screening, interested in Cologuard, understands that if positive then recommendation is for diagnostic colonoscopy to follow-up.  - Patient advised to contact insurance first to learn cost, will notify us when ready for [place] to order Cologuard

## 2017-02-06 NOTE — Progress Notes (Signed)
Subjective:    Patient ID: Todd Frank, male    DOB: 05/17/1955, 61 y.o.   MRN: [id number]  Todd Frank is a 61 y.o. male presenting on 02/06/2017 for Annual Exam   HPI   Here for Annual Physical and Orders for fasting labs.  FOLLOW-UP Chronic Low Back / Leg Pain with R-Sciatica / Lumbar Cyst: S/p Lumbar back surgery to remove spinal cyst causing nerve compress, see prior note for details. - He continues to do very well s/p surgery. No further back pain or radicular sciatica pain. - He has improved core exercises - No new concerns  FOLLOW-UP VERTIGO BPPV - Last visit with me 07/31/16, for initial visit for same problem, treated with handout Epley maneuver and OTC meclizine PRN, exam was not entirely consistent with traditional BPPV, see prior notes for background information. - Interval update with patient notified office after 1 month due to persistent symptoms, requested referral to ENT, he saw Dr Lucia Gaskins ENT in [place] and did not really diagnose or treat anything new. - Today patient reports that his episodic vertigo symptoms have improved, but still experiences them on occasion. No new treatments at this time. He is no longer doing Epley maneuver he did this initially for few weeks some relief - Denies any new concern  CHRONIC HTN: Reports no recent concerns, remains well controlled, checks BP regularly at home. Current Meds - None  Mild-Mod Aortic Stenosis (Biscuspid AoV), Dilated Aortic Root - Followed by [facility] (Dr [name]), next imaging scheduled for ECHO in 02/2017, see prior note for details on imaging and surveillance. - Denies any recent concerns or new symptoms  HYPERLIPIDEMIA: - Followed by Cardiology, has been advised to take statin with atorvastatin to goal LDL < 70. Recent lipid panels 04/2016 with further improved LDL 101 down to 42, otherwise normal lipids on statin. - Fasting today ready for repeat labs at [facility] - Currently taking  Atorvastatin 80mg  daily, tolerating well without side effects or myalgias except he does endorse some mild memory difficulty he is unsure if attributed to statin or not, no significant concern, he still prefers to reduce this dose in future if possible. He is waiting to discuss with Cardiology - He is aware of other meds PSK9 repatha, interested but not ready today, will review with Cardiology - Interested in advanced lipid panel as well  Lifestyle: - Previously wt up to 260 lbs (1990s), he has done well to maintain stable wt after loss - Now gained 3-4 lbs in 6 months, BMI >28 - He continues balanced diet - Exercise: Regular gym workout at [facility] x 6 days weekly in AM, and Sunday walk with wife 1-1.5 hours  Health Maintenance: - Colon CA Screening: Last Colonoscopy 11/15/16 (done by [facility]), results with reported normal next due 10 years, 11/2016. Currently asymptomatic. No known family history of colon CA. Due for screening test considering Cologuard, counseling given, he will check cost/coverage - UTD Flu vaccine 01/23/17 - UTD routine Hep C and HIV screen - UTD TDap - Prostate CA Screening: Last prostate CA screening PSA 0.9 (02/2016), prior DRE reported normal. Currently asymptomatic. No known family history of prostate CA. Due for screening PSA. Defer DRE  Depression screen [facility] 2/9 02/06/2017 01/09/2016 02/02/2015  Decreased Interest 0 0 0  Down, Depressed, Hopeless 0 0 0  PHQ - 2 Score 0 0 0    Past Medical History:  Diagnosis Date  . Aortic stenosis    a. mod-severe by  echo 02/2016 but moderate by cath 02/2016  . Bicuspid aortic valve   . Bradycardia 07/23/2015  . CAD (coronary artery disease), native coronary artery    cath 02/2016 showing  Normal LVF, moderate AS, occluded distal RCA supplied by LCx collaterals, 40% prox RCA, 80% acute marginal, 60% OM3, 90% mid to distal LAD (does not reach the apex and small), 60% D1, 50% OM1, 60% mid LAD, 70% PDA - medical management was felt  to be best option.    . Carotid bruit    carotid dopplers negative.  Bruit due to heart murmur from AS  . Congenital nevus   . Diastolic dysfunction   . Dilated aortic root ([place])    33mm by echo 02/2016  . GERD (gastroesophageal reflux disease)   . HTN (hypertension)   . Hyperlipidemia    LDL goal < 70  . LVH (left ventricular hypertrophy)   . Melanoma of skin ([place])   . Obesity   . PFO (patent foramen ovale)    small by echo 2014   Past Surgical History:  Procedure Laterality Date  . CARDIAC CATHETERIZATION N/A 02/20/2016   Procedure: Right/Left Heart Cath and Coronary Angiography;  Surgeon: Belva Crome, MD;  Location: [facility];  Service: Cardiovascular;  Laterality: N/A;  . EYE SURGERY    . MELANOMA EXCISION  2011   left thigh  . TONSILLECTOMY     Social History   Social History  . Marital status: Married    Spouse name: N/A  . Number of children: N/A  . Years of education: N/A   Occupational History  . Government social research officer - [employer]    Social History Main Topics  . Smoking status: Former Research scientist (life sciences)  . Smokeless tobacco: Never Used  . Alcohol use 0.6 oz/week    1 Cans of beer per week  . Drug use: No  . Sexual activity: Yes   Other Topics Concern  . Not on file   Social History Narrative  . No narrative on file   Family History  Problem Relation Age of Onset  . Diabetes Mother   . Arthritis Father   . Heart disease Paternal Grandmother        MI  . Heart attack Unknown        family history  . Pancreatic cancer Maternal Aunt    Current Outpatient Prescriptions on File Prior to Visit  Medication Sig  . amLODipine (NORVASC) 2.5 MG tablet Take 1 tablet (2.5 mg total) by mouth daily.  Marland Kitchen aspirin 81 MG tablet Take 81 mg by mouth daily.  Marland Kitchen atorvastatin (LIPITOR) 80 MG tablet Take 1 tablet (80 mg total) by mouth daily.  . Coenzyme Q10 (CO Q 10 PO) Take by mouth daily.  . fish oil-omega-3 fatty acids 1000 MG capsule Take 2,400 mg by mouth daily.   .  fluticasone (FLONASE) 50 MCG/ACT nasal spray Place 2 sprays into both nostrils daily.   No current facility-administered medications on file prior to visit.     Review of Systems  Constitutional: Negative for activity change, appetite change, chills, diaphoresis, fatigue and fever.  HENT: Negative for congestion, hearing loss and sinus pressure.   Eyes: Negative for visual disturbance.  Respiratory: Negative for apnea, cough, choking, chest tightness, shortness of breath and wheezing.   Cardiovascular: Negative for chest pain, palpitations and leg swelling.  Gastrointestinal: Negative for abdominal pain, anal bleeding, blood in stool, constipation, diarrhea, nausea and vomiting.  Endocrine: Negative for cold intolerance and  polyuria.  Genitourinary: Negative for decreased urine volume, difficulty urinating, dysuria, frequency, hematuria, testicular pain and urgency.  Musculoskeletal: Negative for arthralgias, back pain (improved) and neck pain.  Skin: Negative for rash.  Allergic/Immunologic: Negative for environmental allergies.  Neurological: Negative for dizziness, weakness, light-headedness, numbness and headaches.  Hematological: Negative for adenopathy.  Psychiatric/Behavioral: Negative for behavioral problems, dysphoric mood and sleep disturbance. The patient is not nervous/anxious.    Per HPI unless specifically indicated above     Objective:    BP 118/80   Pulse 70   Temp 98.7 F (37.1 C) (Oral)   Resp 16   Ht 5\' 9"  (1.753 m)   Wt 190 lb 9.6 oz (86.5 kg)   BMI 28.15 kg/m   Wt Readings from Last 3 Encounters:  02/06/17 190 lb 9.6 oz (86.5 kg)  07/31/16 187 lb (84.8 kg)  05/28/16 182 lb 12.8 oz (82.9 kg)    Physical Exam  Constitutional: He is oriented to person, place, and time. He appears well-developed and well-nourished. No distress.  Well-appearing, comfortable, cooperative  HENT:  Head: Normocephalic and atraumatic.  Mouth/Throat: Oropharynx is clear and  moist.  Frontal / maxillary sinuses non-tender. Nares patent without purulence or edema. Bilateral TMs clear without erythema, effusion or bulging. Oropharynx clear without erythema, exudates, edema or asymmetry.  Eyes: Pupils are equal, round, and reactive to light. Conjunctivae and EOM are normal. Right eye exhibits no discharge. Left eye exhibits no discharge.  Neck: Normal range of motion. Neck supple. No thyromegaly present.  Carotid bruit bilateral, radiating from aortic valve, unchanged  Cardiovascular: Normal rate, regular rhythm and intact distal pulses.   Murmur (2/6 systolic murmur over left sternal border / aortic) heard. Pulmonary/Chest: Effort normal and breath sounds normal. No respiratory distress. He has no wheezes. He has no rales.  Abdominal: Soft. Bowel sounds are normal. He exhibits no distension and no mass. There is no tenderness.  Genitourinary:  Genitourinary Comments: Deferred DRE  Musculoskeletal: Normal range of motion. He exhibits no edema or tenderness.  Upper / Lower Extremities: - Normal muscle tone, strength bilateral upper extremities 5/5, lower extremities 5/5  Lymphadenopathy:    He has no cervical adenopathy.  Neurological: He is alert and oriented to person, place, and time.  Distal sensation intact to light touch all extremities  Skin: Skin is warm and dry. No rash noted. He is not diaphoretic. No erythema.  Psychiatric: He has a normal mood and affect. His behavior is normal.  Well groomed, good eye contact, normal speech and thoughts  Nursing note and vitals reviewed.  Results for orders placed or performed in visit on 02/06/17  TSH  Result Value Ref Range   TSH 2.13 0.41 - 5.90  Magnesium  Result Value Ref Range   Magnesium 2.2       Assessment & Plan:   Problem List Items Addressed This Visit    Benign paroxysmal positional vertigo    Stable now gradually improved, presumed BPPV uncertain which side Evaluated by [facility] ENT Dr Lucia Gaskins,  no new dx or treatment May resume Epley PRN Follow-up if worsening, consider vestibular rehab      CAD (coronary artery disease), native coronary artery    Stable without angina Followed by [facility] Cardiology Dr Radford Pax Last heart cath 02/2016, followed for moderate AS bicuspid valve with ECHO Continues on medication management      Relevant Orders   CBC with Differential/Platelet   Lipid panel   Chronic low back pain with right-sided sciatica  Dramatic resolution s/p spine surgery removal of cyst lumbar with nerve compression No further concerns, mostly asymptomatic Improve core exercises and activity Follow-up as needed      Colon cancer screening    Due for routine colon cancer screening. Last colonoscopy 11/2006, good for 10 years by report - Discussion today about recommendations for either Colonoscopy or Cologuard screening, benefits and risks of screening, interested in Cologuard, understands that if positive then recommendation is for diagnostic colonoscopy to follow-up.  - Patient advised to contact insurance first to learn cost, will notify us when ready for [place] to order Cologuard       Dilated aortic root ([place])    Stable without known change Followed by [facility] Cardiology surveillance with CT angio and also ECHO monitoring for AS Follow-up as scheduled 02/2017      Essential hypertension    Well-controlled HTN - Home BP readings controlled  Complication CAD, AS  Plan:  1. Not on anti-HTN therapy - no change 2. Encourage improved lifestyle - low sodium diet, regular exercise 3. Continue monitor BP outside office, bring readings to next visit, if persistently >140/90 or new symptoms notify office sooner 4. Follow-up q 6-12 mo      Relevant Orders   Comprehensive metabolic panel   Hypercholesteremia    Controlled cholesterol on statin and lifestyle. At goal LDL < 70 Last lipid panel 04/2016 Known CAD  Plan: 1. Continue current meds - Atorvastatin 80mg  daily per  Cards - discussed may consider switch to alternative PSK9 inhibitor repatha, likely could get approved with known ASCVD, high risk patient and if concern on statin 2. Continue ASA 81mg  for secondary ASCVD risk reduction 3. Encourage improved lifestyle - low carb/cholesterol, reduce portion size, continue improving regular exercise 4. Check fasting lipids today, then follow-up q 12 months unless needs to discuss sooner - Future consider Advanced Lipid Panel w/ inflammation testing through [facility]      Relevant Orders   Lipid panel   Prostate cancer screening    Avg / Low risk patient, with prior reported negative screening - Last PSA 0.9 (02/2016), prior values normal - Last DRE 1-2 years ago reported normal - Clinically asymptomatic  Plan: 1. Reviewed prostate cancer screening guidelines and risks including potential prostate biopsy if abnormal PSA, proceed with yearly PSA for now, and anticipate DRE as needed especially if abnormal PSA or new symptoms      Relevant Orders   PSA    Other Visit Diagnoses    Annual physical exam    -  Primary   Relevant Orders   Comprehensive metabolic panel   CBC with Differential/Platelet   Lipid panel   Hemoglobin A1c   Screening for prostate cancer          No orders of the defined types were placed in this encounter.   Follow up plan: Return in about 1 year (around 02/06/2018) for Annual Physical.  Nobie Putnam, DO [facility] 02/06/2017, 12:16 PM

## 2017-02-11 LAB — CBC WITH DIFFERENTIAL/PLATELET
Basophils Absolute: 0.1 10*3/uL (ref 0.0–0.2)
Basos: 1 %
EOS (ABSOLUTE): 0.1 10*3/uL (ref 0.0–0.4)
EOS: 3 %
HEMATOCRIT: 45.2 % (ref 37.5–51.0)
Hemoglobin: 15.3 g/dL (ref 13.0–17.7)
Immature Grans (Abs): 0 10*3/uL (ref 0.0–0.1)
Immature Granulocytes: 0 %
LYMPHS ABS: 1.3 10*3/uL (ref 0.7–3.1)
Lymphs: 31 %
MCH: 30.8 pg (ref 26.6–33.0)
MCHC: 33.8 g/dL (ref 31.5–35.7)
MCV: 91 fL (ref 79–97)
MONOS ABS: 0.4 10*3/uL (ref 0.1–0.9)
Monocytes: 9 %
NEUTROS ABS: 2.4 10*3/uL (ref 1.4–7.0)
NEUTROS PCT: 56 %
PLATELETS: 133 10*3/uL — AB (ref 150–379)
RBC: 4.96 x10E6/uL (ref 4.14–5.80)
RDW: 14.1 % (ref 12.3–15.4)
WBC: 4.2 10*3/uL (ref 3.4–10.8)

## 2017-02-11 LAB — HEMOGLOBIN A1C
Est. average glucose Bld gHb Est-mCnc: 103 mg/dL
Hgb A1c MFr Bld: 5.2 % (ref 4.8–5.6)

## 2017-02-11 LAB — COMPREHENSIVE METABOLIC PANEL
A/G RATIO: 2.3 — AB (ref 1.2–2.2)
ALBUMIN: 4.4 g/dL (ref 3.6–4.8)
ALT: 28 IU/L (ref 0–44)
AST: 36 IU/L (ref 0–40)
Alkaline Phosphatase: 83 IU/L (ref 39–117)
BUN / CREAT RATIO: 16 (ref 10–24)
BUN: 13 mg/dL (ref 8–27)
Bilirubin Total: 0.9 mg/dL (ref 0.0–1.2)
CO2: 23 mmol/L (ref 20–29)
Calcium: 9.3 mg/dL (ref 8.6–10.2)
Chloride: 106 mmol/L (ref 96–106)
Creatinine, Ser: 0.82 mg/dL (ref 0.76–1.27)
GFR calc Af Amer: 110 mL/min/{1.73_m2} (ref >59)
GFR, EST NON AFRICAN AMERICAN: 95 mL/min/{1.73_m2} (ref >59)
GLOBULIN, TOTAL: 1.9 g/dL (ref 1.5–4.5)
Glucose: 87 mg/dL (ref 65–99)
POTASSIUM: 4.2 mmol/L (ref 3.5–5.2)
SODIUM: 142 mmol/L (ref 134–144)
Total Protein: 6.3 g/dL (ref 6.0–8.5)

## 2017-02-11 LAB — LIPID PANEL
CHOL/HDL RATIO: 1.9 ratio (ref 0.0–5.0)
Cholesterol, Total: 118 mg/dL (ref 100–199)
HDL: 63 mg/dL (ref >39)
LDL Calculated: 44 mg/dL (ref 0–99)
Triglycerides: 55 mg/dL (ref 0–149)
VLDL CHOLESTEROL CAL: 11 mg/dL (ref 5–40)

## 2017-02-11 LAB — PSA: Prostate Specific Ag, Serum: 0.9 ng/mL (ref 0.0–4.0)

## 2017-02-17 ENCOUNTER — Encounter: Payer: Self-pay | Admitting: Family Medicine

## 2017-02-17 DIAGNOSIS — Z1211 Encounter for screening for malignant neoplasm of colon: Secondary | ICD-10-CM

## 2017-02-18 NOTE — Telephone Encounter (Signed)
Order placed for Cologuard. Please complete/fax form.  Nobie Putnam, [facility] 02/18/2017, 1:27 PM

## 2017-02-25 ENCOUNTER — Ambulatory Visit (HOSPITAL_COMMUNITY): Payer: 59 | Attending: Internal Medicine

## 2017-02-25 ENCOUNTER — Other Ambulatory Visit: Payer: Self-pay

## 2017-02-25 DIAGNOSIS — I77810 Thoracic aortic ectasia: Secondary | ICD-10-CM | POA: Insufficient documentation

## 2017-02-25 DIAGNOSIS — Q211 Atrial septal defect: Secondary | ICD-10-CM | POA: Diagnosis present

## 2017-02-25 DIAGNOSIS — I350 Nonrheumatic aortic (valve) stenosis: Secondary | ICD-10-CM | POA: Diagnosis not present

## 2017-02-25 DIAGNOSIS — Q2112 Patent foramen ovale: Secondary | ICD-10-CM

## 2017-03-13 ENCOUNTER — Other Ambulatory Visit: Payer: Self-pay | Admitting: Cardiology

## 2017-03-17 ENCOUNTER — Encounter: Payer: Self-pay | Admitting: Family Medicine

## 2017-04-02 LAB — COLOGUARD: Cologuard: NEGATIVE

## 2017-04-09 ENCOUNTER — Encounter: Payer: Self-pay | Admitting: Family Medicine

## 2017-05-29 ENCOUNTER — Other Ambulatory Visit: Payer: Self-pay | Admitting: Cardiology

## 2017-05-31 ENCOUNTER — Encounter: Payer: Self-pay | Admitting: Intensive Care

## 2017-05-31 ENCOUNTER — Emergency Department: Payer: Managed Care, Other (non HMO)

## 2017-05-31 ENCOUNTER — Other Ambulatory Visit: Payer: Self-pay

## 2017-05-31 ENCOUNTER — Observation Stay: Payer: Managed Care, Other (non HMO)

## 2017-05-31 ENCOUNTER — Inpatient Hospital Stay
Admission: EM | Admit: 2017-05-31 | Discharge: 2017-06-02 | DRG: 287 | Disposition: A | Discharge status: Home / Self Care | Payer: Managed Care, Other (non HMO) | Attending: Internal Medicine | Admitting: Internal Medicine

## 2017-05-31 DIAGNOSIS — Z800 Family history of malignant neoplasm of digestive organs: Secondary | ICD-10-CM

## 2017-05-31 DIAGNOSIS — K219 Gastro-esophageal reflux disease without esophagitis: Secondary | ICD-10-CM | POA: Diagnosis present

## 2017-05-31 DIAGNOSIS — D696 Thrombocytopenia, unspecified: Secondary | ICD-10-CM | POA: Diagnosis present

## 2017-05-31 DIAGNOSIS — Z6827 Body mass index (BMI) 27.0-27.9, adult: Secondary | ICD-10-CM

## 2017-05-31 DIAGNOSIS — I110 Hypertensive heart disease with heart failure: Secondary | ICD-10-CM | POA: Diagnosis present

## 2017-05-31 DIAGNOSIS — W19XXXA Unspecified fall, initial encounter: Secondary | ICD-10-CM | POA: Diagnosis present

## 2017-05-31 DIAGNOSIS — S0083XA Contusion of other part of head, initial encounter: Secondary | ICD-10-CM | POA: Diagnosis present

## 2017-05-31 DIAGNOSIS — I248 Other forms of acute ischemic heart disease: Secondary | ICD-10-CM | POA: Diagnosis present

## 2017-05-31 DIAGNOSIS — Z8261 Family history of arthritis: Secondary | ICD-10-CM

## 2017-05-31 DIAGNOSIS — Z833 Family history of diabetes mellitus: Secondary | ICD-10-CM

## 2017-05-31 DIAGNOSIS — Z8249 Family history of ischemic heart disease and other diseases of the circulatory system: Secondary | ICD-10-CM

## 2017-05-31 DIAGNOSIS — E669 Obesity, unspecified: Secondary | ICD-10-CM | POA: Diagnosis present

## 2017-05-31 DIAGNOSIS — E785 Hyperlipidemia, unspecified: Secondary | ICD-10-CM | POA: Diagnosis present

## 2017-05-31 DIAGNOSIS — E663 Overweight: Secondary | ICD-10-CM | POA: Diagnosis present

## 2017-05-31 DIAGNOSIS — I350 Nonrheumatic aortic (valve) stenosis: Secondary | ICD-10-CM | POA: Diagnosis not present

## 2017-05-31 DIAGNOSIS — Z87891 Personal history of nicotine dependence: Secondary | ICD-10-CM

## 2017-05-31 DIAGNOSIS — Z7982 Long term (current) use of aspirin: Secondary | ICD-10-CM

## 2017-05-31 DIAGNOSIS — R55 Syncope and collapse: Secondary | ICD-10-CM

## 2017-05-31 DIAGNOSIS — Z85820 Personal history of malignant melanoma of skin: Secondary | ICD-10-CM

## 2017-05-31 DIAGNOSIS — R739 Hyperglycemia, unspecified: Secondary | ICD-10-CM | POA: Diagnosis present

## 2017-05-31 DIAGNOSIS — I2510 Atherosclerotic heart disease of native coronary artery without angina pectoris: Secondary | ICD-10-CM | POA: Diagnosis present

## 2017-05-31 DIAGNOSIS — I5032 Chronic diastolic (congestive) heart failure: Secondary | ICD-10-CM | POA: Diagnosis present

## 2017-05-31 DIAGNOSIS — Q211 Atrial septal defect: Secondary | ICD-10-CM

## 2017-05-31 DIAGNOSIS — R011 Cardiac murmur, unspecified: Secondary | ICD-10-CM | POA: Diagnosis present

## 2017-05-31 DIAGNOSIS — Z79899 Other long term (current) drug therapy: Secondary | ICD-10-CM

## 2017-05-31 DIAGNOSIS — E7800 Pure hypercholesterolemia, unspecified: Secondary | ICD-10-CM | POA: Diagnosis present

## 2017-05-31 DIAGNOSIS — I77810 Thoracic aortic ectasia: Secondary | ICD-10-CM | POA: Diagnosis present

## 2017-05-31 LAB — CBC
HCT: 44.3 % (ref 40.0–52.0)
HEMATOCRIT: 47.7 % (ref 40.0–52.0)
HEMOGLOBIN: 16.1 g/dL (ref 13.0–18.0)
Hemoglobin: 15.1 g/dL (ref 13.0–18.0)
MCH: 30.7 pg (ref 26.0–34.0)
MCH: 30.8 pg (ref 26.0–34.0)
MCHC: 33.8 g/dL (ref 32.0–36.0)
MCHC: 34 g/dL (ref 32.0–36.0)
MCV: 91 fL (ref 80.0–100.0)
MCV: 90.6 fL (ref 80.0–100.0)
PLATELETS: 119 10*3/uL — AB (ref 150–440)
Platelets: 122 10*3/uL — ABNORMAL LOW (ref 150–440)
RBC: 5.24 MIL/uL (ref 4.40–5.90)
RBC: 4.89 MIL/uL (ref 4.40–5.90)
RDW: 13.6 % (ref 11.5–14.5)
RDW: 13.5 % (ref 11.5–14.5)
WBC: 6.9 10*3/uL (ref 3.8–10.6)
WBC: 6.3 10*3/uL (ref 3.8–10.6)

## 2017-05-31 LAB — BASIC METABOLIC PANEL
ANION GAP: 8 (ref 5–15)
BUN: 16 mg/dL (ref 6–20)
CHLORIDE: 105 mmol/L (ref 101–111)
CO2: 26 mmol/L (ref 22–32)
Calcium: 9.4 mg/dL (ref 8.9–10.3)
Creatinine, Ser: 0.86 mg/dL (ref 0.61–1.24)
GFR calc Af Amer: >60 mL/min (ref >60)
Glucose, Bld: 110 mg/dL — ABNORMAL HIGH (ref 65–99)
POTASSIUM: 4.2 mmol/L (ref 3.5–5.1)
SODIUM: 139 mmol/L (ref 135–145)

## 2017-05-31 LAB — TROPONIN I
TROPONIN I: 0.1 ng/mL — AB (ref <0.03)
Troponin I: 0.06 ng/mL (ref <0.03)
Troponin I: 0.07 ng/mL (ref <0.03)

## 2017-05-31 LAB — CREATININE, SERUM
CREATININE: 0.87 mg/dL (ref 0.61–1.24)
GFR calc Af Amer: >60 mL/min (ref >60)
GFR calc non Af Amer: >60 mL/min (ref >60)

## 2017-05-31 LAB — TSH: TSH: 1.226 u[IU]/mL (ref 0.350–4.500)

## 2017-05-31 MED ORDER — MORPHINE SULFATE (PF) 2 MG/ML IV SOLN: 2.0000 mg | INTRAVENOUS | Status: DC | PRN

## 2017-05-31 MED ORDER — ACETAMINOPHEN 650 MG RE SUPP: 650.0000 mg | Freq: Four times a day (QID) | RECTAL | Status: DC | PRN

## 2017-05-31 MED ORDER — ASPIRIN 81 MG PO CHEW
243.0000 mg | CHEWABLE_TABLET | Freq: Once | ORAL | Status: AC
  Administered 2017-05-31: 243 mg via ORAL
  Filled 2017-05-31: qty 3

## 2017-05-31 MED ORDER — ASPIRIN EC 81 MG PO TBEC
81.0000 mg | DELAYED_RELEASE_TABLET | Freq: Every day | ORAL | Status: DC
  Administered 2017-06-01 – 2017-06-02 (×2): 81 mg via ORAL
  Filled 2017-05-31 (×2): qty 1

## 2017-05-31 MED ORDER — NITROGLYCERIN 0.4 MG SL SUBL: 0.4000 mg | SUBLINGUAL_TABLET | SUBLINGUAL | Status: DC | PRN

## 2017-05-31 MED ORDER — ACETAMINOPHEN 325 MG PO TABS: 650.0000 mg | ORAL_TABLET | Freq: Four times a day (QID) | ORAL | Status: DC | PRN

## 2017-05-31 MED ORDER — ATORVASTATIN CALCIUM 20 MG PO TABS
80.0000 mg | ORAL_TABLET | Freq: Every day | ORAL | Status: DC
  Administered 2017-06-01 – 2017-06-02 (×2): 80 mg via ORAL
  Filled 2017-05-31 (×2): qty 4

## 2017-05-31 MED ORDER — ONDANSETRON HCL 4 MG/2ML IJ SOLN
4.0000 mg | Freq: Four times a day (QID) | INTRAMUSCULAR | Status: DC | PRN
  Filled 2017-05-31: qty 2

## 2017-05-31 MED ORDER — ENOXAPARIN SODIUM 40 MG/0.4ML ~~LOC~~ SOLN
40.0000 mg | SUBCUTANEOUS | Status: DC
  Administered 2017-05-31 – 2017-06-01 (×2): 40 mg via SUBCUTANEOUS
  Filled 2017-05-31 (×2): qty 0.4

## 2017-05-31 MED ORDER — LACTATED RINGERS IV SOLN
INTRAVENOUS | Status: AC
  Administered 2017-05-31: 18:00:00 via INTRAVENOUS

## 2017-05-31 MED ORDER — POLYETHYLENE GLYCOL 3350 17 G PO PACK: 17.0000 g | PACK | Freq: Every day | ORAL | Status: DC | PRN

## 2017-05-31 MED ORDER — ONDANSETRON HCL 4 MG PO TABS: 4.0000 mg | ORAL_TABLET | Freq: Four times a day (QID) | ORAL | Status: DC | PRN

## 2017-05-31 MED ORDER — HYDROCODONE-ACETAMINOPHEN 5-325 MG PO TABS: 1.0000 | ORAL_TABLET | ORAL | Status: DC | PRN

## 2017-05-31 NOTE — ED Notes (Signed)
Pt taken back to er 57, cardiac monitor attached to pt, primary RN notified that pt was coming back to room. edp at bedside.

## 2017-05-31 NOTE — H&P (Signed)
[facility] NAME: Kason Benak    MR#:  [id number]  DATE OF BIRTH:  [birth year]  DATE OF ADMISSION:  05/31/2017  PRIMARY CARE PHYSICIAN: Olin Hauser, DO   REQUESTING/REFERRING PHYSICIAN:   CHIEF COMPLAINT:   Chief Complaint  Patient presents with  . Loss of Consciousness    HISTORY OF PRESENT ILLNESS: Todd Frank  is a 62 y.o. male with a known history of severe aortic stenosis, was out jogging earlier today, developed acute loss of consciousness/syncope with collapse, results resulting in fall hitting his head, in the emergency room workup noted for right forehead cephalhematoma on CT of the head, elevated troponin of 0.06, the rest of his workup was on impressive, patient in no apparent distress, resting comfortably in bed, patient stated this is his first ever episode of syncope/near syncope within the last year, has had conversations with his cardiologist in the past about needing aortic valve replacement in the near future, patient now being admitted for acute syncope secondary to severe aortic stenosis.  PAST MEDICAL HISTORY:   Past Medical History:  Diagnosis Date  . Aortic stenosis    a. mod-severe by echo 02/2016 but moderate by cath 02/2016  . Bicuspid aortic valve   . Bradycardia 07/23/2015  . CAD (coronary artery disease), native coronary artery    cath 02/2016 showing  Normal LVF, moderate AS, occluded distal RCA supplied by LCx collaterals, 40% prox RCA, 80% acute marginal, 60% OM3, 90% mid to distal LAD (does not reach the apex and small), 60% D1, 50% OM1, 60% mid LAD, 70% PDA - medical management was felt to be best option.    . Carotid bruit    carotid dopplers negative.  Bruit due to heart murmur from AS  . Congenital nevus   . Diastolic dysfunction   . Dilated aortic root ([place])    65mm by echo 02/2016  . GERD (gastroesophageal reflux disease)   . HTN (hypertension)   . Hyperlipidemia    LDL goal <  70  . LVH (left ventricular hypertrophy)   . Melanoma of skin ([place])   . Obesity   . PFO (patent foramen ovale)    small by echo 2014    PAST SURGICAL HISTORY:  Past Surgical History:  Procedure Laterality Date  . CARDIAC CATHETERIZATION N/A 02/20/2016   Procedure: Right/Left Heart Cath and Coronary Angiography;  Surgeon: Belva Crome, MD;  Location: [facility];  Service: Cardiovascular;  Laterality: N/A;  . EYE SURGERY    . MELANOMA EXCISION  2011   left thigh  . TONSILLECTOMY      SOCIAL HISTORY:  Social History   Tobacco Use  . Smoking status: Former Research scientist (life sciences)  . Smokeless tobacco: Never Used  Substance Use Topics  . Alcohol use: Yes    Alcohol/week: 0.6 oz    Types: 1 Cans of beer per week    FAMILY HISTORY:  Family History  Problem Relation Age of Onset  . Diabetes Mother   . Arthritis Father   . Heart disease Paternal Grandmother        MI  . Heart attack Unknown        family history  . Pancreatic cancer Maternal Aunt     DRUG ALLERGIES: No Known Allergies  REVIEW OF SYSTEMS:   CONSTITUTIONAL: No fever, fatigue or weakness.  EYES: No blurred or double vision.  EARS, NOSE, AND THROAT: No tinnitus or ear pain.  RESPIRATORY:  No cough, shortness of breath, wheezing or hemoptysis.  CARDIOVASCULAR: No chest pain, orthopnea, edema.  GASTROINTESTINAL: No nausea, vomiting, diarrhea or abdominal pain.  GENITOURINARY: No dysuria, hematuria.  ENDOCRINE: No polyuria, nocturia,  HEMATOLOGY: No anemia, easy bruising or bleeding SKIN: No rash or lesion. MUSCULOSKELETAL: No joint pain or arthritis.   NEUROLOGIC: No tingling, numbness, weakness.  Positive loss of consciousness with collapse PSYCHIATRY: No anxiety or depression.   MEDICATIONS AT HOME:  Prior to Admission medications   Medication Sig Start Date End Date Taking? Authorizing Provider  amLODipine (NORVASC) 2.5 MG tablet TAKE 1 TABLET BY MOUTH  DAILY 03/17/17  Yes [name], [name], MD  aspirin 81  MG tablet Take 81 mg by mouth daily.   Yes [provider]  atorvastatin (LIPITOR) 80 MG tablet Take 1 tablet (80 mg total) by mouth daily. 06/23/16  Yes [name], [name], MD  Coenzyme Q10 (CO Q 10 PO) Take 1 tablet by mouth daily.    Yes [provider]  fish oil-omega-3 fatty acids 1000 MG capsule Take 2,400 mg by mouth daily.    Yes [provider]  fluticasone (FLONASE) 50 MCG/ACT nasal spray Place 2 sprays into both nostrils daily. 08/25/16   Johnson, Megan P, DO      PHYSICAL EXAMINATION:   VITAL SIGNS: Blood pressure (!) 133/94, pulse 67, temperature 98.5 F (36.9 C), temperature source Oral, resp. rate 17, height 5\' 9"  (1.753 m), weight 85.3 kg (188 lb), SpO2 96 %.  GENERAL:  62 y.o.-year-old patient lying in the bed with no acute distress.  EYES: Pupils equal, round, reactive to light and accommodation. No scleral icterus. Extraocular muscles intact.  HEENT: Head atraumatic, normocephalic. Oropharynx and nasopharynx clear.  NECK:  Supple, no jugular venous distention. No thyroid enlargement, no tenderness.  LUNGS: Normal breath sounds bilaterally, no wheezing, rales,rhonchi or crepitation. No use of accessory muscles of respiration.  CARDIOVASCULAR: S1, S2 normal. No murmurs, rubs, or gallops.  ABDOMEN: Soft, nontender, nondistended. Bowel sounds present. No organomegaly or mass.  EXTREMITIES: No pedal edema, cyanosis, or clubbing.  NEUROLOGIC: Cranial nerves II through XII are intact. Muscle strength 5/5 in all extremities. Sensation intact. Gait not checked.  PSYCHIATRIC: The patient is alert and oriented x 3.  SKIN: No obvious rash, lesion, or ulcer.  Right forehead cephalohematoma, skin abrasions on the forehead/face  LABORATORY PANEL:   CBC Recent Labs  Lab 05/31/17 1156  WBC 6.9  HGB 16.1  HCT 47.7  PLT 122*  MCV 91.0  MCH 30.7  MCHC 33.8  RDW 13.6    ------------------------------------------------------------------------------------------------------------------  Chemistries  Recent Labs  Lab 05/31/17 1156  NA 139  K 4.2  CL 105  CO2 26  GLUCOSE 110*  BUN 16  CREATININE 0.86  CALCIUM 9.4   ------------------------------------------------------------------------------------------------------------------ estimated creatinine clearance is 96.4 mL/min (by C-G formula based on SCr of 0.86 mg/dL). ------------------------------------------------------------------------------------------------------------------ No results for input(s): TSH, T4TOTAL, T3FREE, THYROIDAB in the last 72 hours.  Invalid input(s): FREET3   Coagulation profile No results for input(s): INR, PROTIME in the last 168 hours. ------------------------------------------------------------------------------------------------------------------- No results for input(s): DDIMER in the last 72 hours. -------------------------------------------------------------------------------------------------------------------  Cardiac Enzymes Recent Labs  Lab 05/31/17 1156  TROPONINI 0.06*   ------------------------------------------------------------------------------------------------------------------ Invalid input(s): POCBNP  ---------------------------------------------------------------------------------------------------------------  Urinalysis    Component Value Date/Time   COLORURINE YELLOW (A) 08/07/2015 2301   APPEARANCEUR CLEAR (A) 08/07/2015 2301   LABSPEC 1.023 08/07/2015 2301   PHURINE 6.0 08/07/2015 2301   GLUCOSEU NEGATIVE 08/07/2015 2301  HGBUR NEGATIVE 08/07/2015 2301   BILIRUBINUR NEGATIVE 08/07/2015 2301   KETONESUR NEGATIVE 08/07/2015 2301   PROTEINUR NEGATIVE 08/07/2015 2301   UROBILINOGEN 0.2 10/28/2007 2316   NITRITE NEGATIVE 08/07/2015 2301   LEUKOCYTESUR NEGATIVE 08/07/2015 2301     RADIOLOGY: Dg Chest 2 View  Result Date:  05/31/2017 CLINICAL DATA:  Nonsmoker.  Syncope. EXAM: CHEST  2 VIEW COMPARISON:  Aug 07, 2015 FINDINGS: The heart size and mediastinal contours are within normal limits. Both lungs are clear. The visualized skeletal structures are unremarkable. IMPRESSION: No active cardiopulmonary disease. Electronically Signed   By: Dorise Bullion III M.D   On: 05/31/2017 13:57   Ct Head Wo Contrast  Result Date: 05/31/2017 CLINICAL DATA:  Syncope.  Hematoma right forehead. EXAM: CT HEAD WITHOUT CONTRAST TECHNIQUE: Contiguous axial images were obtained from the base of the skull through the vertex without intravenous contrast. COMPARISON:  None. FINDINGS: Brain: No subdural, epidural, or subarachnoid hemorrhage. Cerebellum, brainstem, and basal cisterns are normal. Ventricles and sulci are unremarkable. No acute cortical ischemia or infarct. No other acute abnormalities. No mass effect or midline shift. Vascular: No hyperdense vessel or unexpected calcification. Skull: Normal. Negative for fracture or focal lesion. Sinuses/Orbits: No acute finding. Other: Soft tissue swelling over the right side of the forehead. Extracranial soft tissues are otherwise normal. IMPRESSION: No acute intracranial abnormality. Soft tissue swelling over the right forehead. Electronically Signed   By: Dorise Bullion III M.D   On: 05/31/2017 13:56    EKG: Orders placed or performed during the hospital encounter of 05/31/17  . ED EKG  . ED EKG    IMPRESSION AND PLAN: 1 acute syncope with collapse Secondary to severe aortic stenosis Referred to the observation unit, rule out acute coronary syndrome with cardiac enzymes x3 sets, cardiology consult for expert opinion, echocardiogram noted for November 2018 normal ejection fraction/severe aortic stenosis, check carotid Dopplers, neurochecks per routine, fall precautions, IV fluids for rehydration, and continue close medical monitoring  2 acute elevated troponins Most likely secondary to  demand ischemia Aspirin, statin therapy, nitrates as needed, IV morphine as needed breakthrough pain, supplemental oxygen as needed, echocardiogram noted above, cardiology to see  3 chronic severe aortic stenosis Plan of care as stated above  4 chronic hyperlipidemia, unspecified Statin therapy will be continued  5 incomplete MAR Complete when available  Discharge tomorrow after cardiology evaluation barring any complications   All the records are reviewed and case discussed with ED provider. Management plans discussed with the patient, family and they are in agreement.  CODE STATUS:full Code Status History    Date Active Date Inactive Code Status Order ID Comments User Context   02/20/2016 09:49 02/20/2016 16:23 Full Code [id number]  Belva Crome, MD Inpatient    Advance Directive Documentation     Most Recent Value  Type of Advance Directive  Living will  Pre-existing out of facility DNR order (yellow form or pink MOST form)  No data  "MOST" Form in Place?  No data       TOTAL TIME TAKING CARE OF THIS PATIENT: 45 minutes.    Avel Peace Minoru Chap M.D on 05/31/2017   Between 7am to 6pm - Pager - [phone]  After 6pm go to [email or web address] - password [email or web address] [facility]  Office  [phone]  CC: Primary care physician; Olin Hauser, DO   Note: This dictation was prepared with Dragon dictation along with smaller phrase technology. Any transcriptional errors that result from this process  are unintentional.

## 2017-05-31 NOTE — ED Notes (Signed)
FN: pt was jogging and got light headed and had syncopal episode prior to arrival. Pt fell forward on face, abrasions noted to face.

## 2017-05-31 NOTE — ED Triage Notes (Signed)
Patient reports he was out jogging this AM and started feeling lightheaded and passed out and hit his head. Cuts and bruising noted to R side of face from fall. Patient states another runner stopped and helped him get up. Patient is unsure how long he had LOC. Denies being on blood thinners. HX aortic stenosis. Patient is ambulatory in triage with no problems and A&O x4

## 2017-05-31 NOTE — ED Provider Notes (Signed)
[facility] Emergency Department Provider Note  Time seen: 1:30 PM  I have reviewed the triage vital signs and the nursing notes.   HISTORY  Chief Complaint Loss of Consciousness    HPI Todd Frank is a 62 y.o. male with a past medical history of moderate aortic stenosis, CAD with significant disease on last catheterization, but no stents, hypertension, presents to the emergency department after syncopal episode.  According to the patient he was jogging this morning when he began feeling lightheaded, states he went to sit down on the curb, lost consciousness and most of falling forwards.  Patient awoke with a large hematoma to his forehead and bleeding from the forehead and right cheek.  Patient denies any chest pain or shortness of breath at any point.  States he was feeling lightheaded for several seconds before he passed out.  Negative review of systems otherwise.   Past Medical History:  Diagnosis Date  . Aortic stenosis    a. mod-severe by echo 02/2016 but moderate by cath 02/2016  . Bicuspid aortic valve   . Bradycardia 07/23/2015  . CAD (coronary artery disease), native coronary artery    cath 02/2016 showing  Normal LVF, moderate AS, occluded distal RCA supplied by LCx collaterals, 40% prox RCA, 80% acute marginal, 60% OM3, 90% mid to distal LAD (does not reach the apex and small), 60% D1, 50% OM1, 60% mid LAD, 70% PDA - medical management was felt to be best option.    . Carotid bruit    carotid dopplers negative.  Bruit due to heart murmur from AS  . Congenital nevus   . Diastolic dysfunction   . Dilated aortic root ([place])    72mm by echo 02/2016  . GERD (gastroesophageal reflux disease)   . HTN (hypertension)   . Hyperlipidemia    LDL goal < 70  . LVH (left ventricular hypertrophy)   . Melanoma of skin ([place])   . Obesity   . PFO (patent foramen ovale)    small by echo 2014    Patient Active Problem List   Diagnosis Date Noted  . Benign  paroxysmal positional vertigo 07/31/2016  . CAD (coronary artery disease), native coronary artery   . Carotid bruit   . Abnormal nuclear stress test 02/20/2016  . Chronic low back pain with right-sided sciatica 02/04/2016  . Overweight (BMI 25.0-29.9) 02/04/2016  . Prostate cancer screening 02/04/2016  . Exposure to hepatitis C 02/04/2016  . History of melanoma excision 01/09/2016  . Colon cancer screening 01/09/2016  . Bradycardia 07/23/2015  . Allergic rhinitis 05/14/2015  . ED (erectile dysfunction) 02/02/2015  . Hamstring tightness 12/02/2013  . Fasciculations of muscle 12/02/2013  . Piriformis syndrome of right side 11/01/2013  . Iliotibial band syndrome 10/11/2013  . Aortic valve stenosis 01/26/2013  . Essential hypertension 01/26/2013  . Hypercholesteremia   . LVH (left ventricular hypertrophy)   . PFO (patent foramen ovale)   . Diastolic dysfunction   . GERD (gastroesophageal reflux disease)   . Dilated aortic root ([place])     Past Surgical History:  Procedure Laterality Date  . CARDIAC CATHETERIZATION N/A 02/20/2016   Procedure: Right/Left Heart Cath and Coronary Angiography;  Surgeon: Belva Crome, MD;  Location: [facility];  Service: Cardiovascular;  Laterality: N/A;  . EYE SURGERY    . MELANOMA EXCISION  2011   left thigh  . TONSILLECTOMY      Prior to Admission medications   Medication Sig Start Date  End Date Taking? Authorizing Provider  amLODipine (NORVASC) 2.5 MG tablet TAKE 1 TABLET BY MOUTH  DAILY 03/17/17   [name], MD  aspirin 81 MG tablet Take 81 mg by mouth daily.    [provider]  atorvastatin (LIPITOR) 80 MG tablet Take 1 tablet (80 mg total) by mouth daily. 06/23/16   [name], MD  Coenzyme Q10 (CO Q 10 PO) Take by mouth daily.    [provider]  fish oil-omega-3 fatty acids 1000 MG capsule Take 2,400 mg by mouth daily.     [provider]  fluticasone (FLONASE) 50 MCG/ACT nasal spray Place 2  sprays into both nostrils daily. 08/25/16   Park Liter P, DO    No Known Allergies  Family History  Problem Relation Age of Onset  . Diabetes Mother   . Arthritis Father   . Heart disease Paternal Grandmother        MI  . Heart attack Unknown        family history  . Pancreatic cancer Maternal Aunt     Social History Social History   Tobacco Use  . Smoking status: Former Research scientist (life sciences)  . Smokeless tobacco: Never Used  Substance Use Topics  . Alcohol use: Yes    Alcohol/week: 0.6 oz    Types: 1 Cans of beer per week  . Drug use: No    Review of Systems Constitutional: Negative for fever.  Positive for dizziness/lightheadedness for a few seconds before passing out. Eyes: Negative for visual complaints ENT: Negative for recent illness/congestion Cardiovascular: Negative for chest pain. Respiratory: Negative for shortness of breath. Gastrointestinal: Negative for abdominal pain, vomiting and diarrhea. Genitourinary: Negative for urinary compaints Musculoskeletal: Negative for leg pain or swelling Skin: Negative for skin complaints  Neurological: Negative for headache All other ROS negative  ____________________________________________   PHYSICAL EXAM:  VITAL SIGNS: ED Triage Vitals  Enc Vitals Group     BP 05/31/17 1145 (!) 149/98     Pulse Rate 05/31/17 1145 68     Resp 05/31/17 1145 18     Temp 05/31/17 1145 98.5 F (36.9 C)     Temp Source 05/31/17 1145 Oral     SpO2 05/31/17 1145 100 %     Weight 05/31/17 1147 188 lb (85.3 kg)     Height 05/31/17 1147 5\' 9"  (1.753 m)     Head Circumference --      Peak Flow --      Pain Score 05/31/17 1146 1     Pain Loc --      Pain Edu? --      Excl. in [place]? --    Constitutional: Alert and oriented. Well appearing and in no distress. Eyes: Normal exam ENT   Head: Moderate right forehead hematoma with small abrasion.  Small abrasion to right cheek.   Mouth/Throat: Mucous membranes are moist. Cardiovascular:  Normal rate, regular rhythm.  2/6 systolic ejection murmur. Respiratory: Normal respiratory effort without tachypnea nor retractions. Breath sounds are clear a Gastrointestinal: Soft and nontender. No distention.   Musculoskeletal: Nontender with normal range of motion in all extremities. No lower extremity tenderness or edema. Neurologic:  Normal speech and language. No gross focal neurologic deficits Skin:  Skin is warm, dry and intact.  Psychiatric: Mood and affect are normal.   ____________________________________________    EKG  EKG reviewed and interpreted by myself shows normal sinus rhythm at 65 bpm with a narrow QRS, normal axis, normal intervals with no  concerning ST changes.  ____________________________________________    RADIOLOGY  Chest x-ray negative CT head negative  ____________________________________________   INITIAL IMPRESSION / ASSESSMENT AND PLAN / ED COURSE  Pertinent labs & imaging results that were available during my care of the patient were reviewed by me and considered in my medical decision making (see chart for details).  Patient presents to the emergency department after a syncopal episode while jogging this morning.  Patient has a history of aortic stenosis as well as coronary artery disease.  Differential would include syncope, cardiogenic syncope, ACS, demand ischemia, aortic stenosis leading to syncope.  Patient's labs have resulted with a slight troponin elevation 0.06.  No history of troponin elevation in past record review.  Given the slight troponin elevation with significant disease on last catheterization and moderate aortic stenosis we will admit the patient to the hospital service.  Given the hematoma will obtain a CT scan of the head to rule out intracranial abnormality.  Per record review: -moderate AS (by cath 02/2016) with bicuspid AV, diastolic dysfunction, mildly dilated aortic root (46mm by echo 02/2016) and small PFO.  -cath  02/2016 showed normal LVF, moderate AS, occluded distal RCA supplied by LCx collaterals, 40% prox RCA, 80% acute marginal, 60% OM3, 90% mid to distal LAD (does not reach the apex and small), 60% D1, 50% OM1, 60% mid LAD, 70% PDA  Imaging is normal.  We will admit the patient to the hospitalist service for further workup and treatment.  Patient dosed with 243 mg of aspirin.  ____________________________________________   FINAL CLINICAL IMPRESSION(S) / ED DIAGNOSES  Syncope    Harvest Dark, MD 05/31/17 [phone]

## 2017-06-01 ENCOUNTER — Observation Stay (HOSPITAL_BASED_OUTPATIENT_CLINIC_OR_DEPARTMENT_OTHER)
Admit: 2017-06-01 | Discharge: 2017-06-01 | Disposition: A | Discharge status: Home / Self Care | Payer: Managed Care, Other (non HMO) | Attending: Physician Assistant | Admitting: Physician Assistant

## 2017-06-01 ENCOUNTER — Telehealth: Payer: Self-pay | Admitting: Cardiology

## 2017-06-01 DIAGNOSIS — I350 Nonrheumatic aortic (valve) stenosis: Secondary | ICD-10-CM

## 2017-06-01 LAB — TROPONIN I: Troponin I: 0.06 ng/mL (ref <0.03)

## 2017-06-01 LAB — URINALYSIS, COMPLETE (UACMP) WITH MICROSCOPIC
BACTERIA UA: NONE SEEN
Bilirubin Urine: NEGATIVE
Glucose, UA: NEGATIVE mg/dL
Hgb urine dipstick: NEGATIVE
KETONES UR: NEGATIVE mg/dL
Leukocytes, UA: NEGATIVE
Nitrite: NEGATIVE
PH: 6 (ref 5.0–8.0)
Protein, ur: NEGATIVE mg/dL
RBC / HPF: NONE SEEN RBC/hpf (ref 0–5)
SQUAMOUS EPITHELIAL / LPF: NONE SEEN
Specific Gravity, Urine: 1.008 (ref 1.005–1.030)

## 2017-06-01 LAB — ECHOCARDIOGRAM COMPLETE
Height: 69 in
WEIGHTICAEL: 3008 [oz_av]

## 2017-06-01 NOTE — Progress Notes (Signed)
*  PRELIMINARY RESULTS* Echocardiogram 2D Echocardiogram has been performed.  Todd Frank 06/01/2017, 1:36 PM

## 2017-06-01 NOTE — Consult Note (Signed)
Cardiology Consultation:   Patient ID: ORSON RHO; [id number]; Nov 29, 1955   Admit date: 05/31/2017 Date of Consult: 06/01/2017  Primary Care Provider: Olin Hauser, DO Primary Cardiologist: Radford Pax   Patient Profile:   Todd Frank is a 62 y.o. male with a hx of CAD medically managed as detailed below, bicuspid aortic valve with severe aortic stenosis, chronic diastolic CHF, mildly dilated aortic root, HTN, and HLD who is being seen today for the evaluation of syncope in the setting of severe aortic stenosis at the request of Dr. Jerelyn Charles.  History of Present Illness:   Todd Frank previously underwent LHC in 02/2016 for workup of his aortic stenosis that showed an occluded RCA supplied by LCX collaterals, 40% proximal RCA, 80% marginal, 60% OM3, 90% mid to distal LAD (does not reach the apex and small vessel), 60% D1, 50% OM1, 60% mid LAD, 70% PDA, moderate aortic stenosis, normal LV function. Medical management was advised. He was last seen by [name] on 05/28/2016 for routine follow up and was doing well at that time. Follow up echo in 02/2017 showed EF 60-65%, no RWMA, Gr1DD, severe aortic stenosis with trivial regurgitation (mean gradient (S): 41 mm Hg, peak gradient (S): 63 mm Hg, valve area (VTI): 0.89 cm^2,  Valve area (Vmax): 0.77 cm^2, valve area (Vmean): 0.78 cm^2), ascending aorta moderately dilated at 44 mm, trivial MR, moderate biatrial enlargement, type 1R atrial septal aneurysm, PASP 29 mmHg. Follow up appointment was advised.   Patient has remained very active at baseline, exercising 5-6 days per week. Reports he typically feels his best with exercise. He was out for his usual walk/jog on 2/24 when he was nearing completion. He started a final jogging segment and decided to push it a little. He got his heart rate up to 177 bpm per his FitBit. Following this, he was dizzy so he sat down on a bench. This was followed by LOC. No chest pain, palpitations, nausea,  vomiting, or diaphoresis. He does not think he was without consciousness for a long time. He was able to ambulate back home on his own without issues. Because of this episode, he presented to [facility] for further evaluation.   Upon the patient's arrival to [facility] they were found to have stable BP and HR, oxygen saturation 100% on room air, weight 188 pounds. EKG showed nonspecific changes as below, CXR showed no active cardiopulmonary disease, CT head without acute abnormality with soft tissue swelling over the right forehead, carotid ultrasound without hemodynamic stenosis. Labs showed troponin 0.06-->0.10-->0.07-->0.06, TSH normal, WBC 6.9, HGB 16.1, PLT 122, Na 139, K+ 4.2, SCr 0.86, glucose 110. He was given ASA and lactated ringers upon admission. Cardiology was asked to evaluate. Currently, feels back to his baseline.    Past Medical History:  Diagnosis Date  . Aortic stenosis    a. mod-severe by echo 02/2016 but moderate by cath 02/2016  . Bicuspid aortic valve   . Bradycardia 07/23/2015  . CAD (coronary artery disease), native coronary artery    cath 02/2016 showing  Normal LVF, moderate AS, occluded distal RCA supplied by LCx collaterals, 40% prox RCA, 80% acute marginal, 60% OM3, 90% mid to distal LAD (does not reach the apex and small), 60% D1, 50% OM1, 60% mid LAD, 70% PDA - medical management was felt to be best option.    . Carotid bruit    carotid dopplers negative.  Bruit due to heart murmur from AS  . Congenital nevus   .  Diastolic dysfunction   . Dilated aortic root ([place])    51mm by echo 02/2016  . GERD (gastroesophageal reflux disease)   . HTN (hypertension)   . Hyperlipidemia    LDL goal < 70  . LVH (left ventricular hypertrophy)   . Melanoma of skin ([place])   . Obesity   . PFO (patent foramen ovale)    small by echo 2014    Past Surgical History:  Procedure Laterality Date  . CARDIAC CATHETERIZATION N/A 02/20/2016   Procedure: Right/Left Heart Cath and Coronary  Angiography;  Surgeon: Belva Crome, MD;  Location: [facility];  Service: Cardiovascular;  Laterality: N/A;  . EYE SURGERY    . MELANOMA EXCISION  2011   left thigh  . TONSILLECTOMY       Home Meds: Prior to Admission medications   Medication Sig Start Date End Date Taking? Authorizing Provider  aspirin 81 MG tablet Take 81 mg by mouth daily.   Yes [provider]  atorvastatin (LIPITOR) 80 MG tablet Take 1 tablet (80 mg total) by mouth daily. 06/23/16  Yes [name], [name], MD  Coenzyme Q10 (CO Q 10 PO) Take 1 tablet by mouth daily.    Yes [provider]  fish oil-omega-3 fatty acids 1000 MG capsule Take 2,400 mg by mouth daily.    Yes [provider]  amLODipine (NORVASC) 2.5 MG tablet Take 1 tablet (2.5 mg total) by mouth daily. Please make overdue yearly appt with [name]. 2nd attempt 06/01/17   [name], MD  fluticasone (FLONASE) 50 MCG/ACT nasal spray Place 2 sprays into both nostrils daily. 08/25/16   Valerie Roys, DO    Inpatient Medications: Scheduled Meds: . aspirin EC  81 mg Oral Daily  . atorvastatin  80 mg Oral Daily  . enoxaparin (LOVENOX) injection  40 mg Subcutaneous Q24H   Continuous Infusions:  PRN Meds: acetaminophen **OR** acetaminophen, HYDROcodone-acetaminophen, morphine injection, nitroGLYCERIN, ondansetron **OR** ondansetron (ZOFRAN) IV, polyethylene glycol  Allergies:  No Known Allergies  Social History:   Social History   Socioeconomic History  . Marital status: Married    Spouse name: Not on file  . Number of children: Not on file  . Years of education: Not on file  . Highest education level: Not on file  Social Needs  . Financial resource strain: Not hard at all  . Food insecurity - worry: Never true  . Food insecurity - inability: Never true  . Transportation needs - medical: No  . Transportation needs - non-medical: No  Occupational History  . Occupation: Government social research officer - [employer]  Tobacco Use   . Smoking status: Former Research scientist (life sciences)  . Smokeless tobacco: Never Used  Substance and Sexual Activity  . Alcohol use: Yes    Alcohol/week: 0.6 oz    Types: 1 Cans of beer per week  . Drug use: No  . Sexual activity: Yes  Other Topics Concern  . Not on file  Social History Narrative  . Not on file     Family History:   Family History  Problem Relation Age of Onset  . Diabetes Mother   . Arthritis Father   . Heart disease Paternal Grandmother        MI  . Heart attack Unknown        family history  . Pancreatic cancer Maternal Aunt     ROS:  Review of Systems  Constitutional: Positive for malaise/fatigue. Negative for chills, diaphoresis, fever and weight loss.  HENT: Negative for congestion.   Eyes: Negative for discharge and redness.  Respiratory: Negative for cough, hemoptysis, sputum production, shortness of breath and wheezing.   Cardiovascular: Negative for chest pain, palpitations, orthopnea, claudication, leg swelling and PND.  Gastrointestinal: Negative for abdominal pain, blood in stool, heartburn, melena, nausea and vomiting.  Genitourinary: Negative for hematuria.  Musculoskeletal: Positive for falls. Negative for myalgias.  Skin: Negative for rash.  Neurological: Positive for loss of consciousness, weakness and headaches. Negative for dizziness, tingling, tremors, sensory change, speech change and focal weakness.  Endo/Heme/Allergies: Does not bruise/bleed easily.  Psychiatric/Behavioral: Negative for substance abuse. The patient is not nervous/anxious.   All other systems reviewed and are negative.     Physical Exam/Data:   Vitals:   05/31/17 1530 05/31/17 1556 05/31/17 2019 06/01/17 0533  BP: 133/84 (!) 142/73 134/83 135/76  Pulse: (!) 57 61 67 (!) 56  Resp: 14     Temp:  98.3 F (36.8 C) 98.3 F (36.8 C) 97.9 F (36.6 C)  TempSrc:  Oral Oral Oral  SpO2: 98% 99% 96% 98%  Weight:      Height:        Intake/Output Summary (Last 24 hours) at 06/01/2017  0946 Last data filed at 06/01/2017 0300 Gross per 24 hour  Intake 920 ml  Output -  Net 920 ml   Filed Weights   05/31/17 1147  Weight: 188 lb (85.3 kg)   Body mass index is 27.76 kg/m.   Physical Exam: General: Well developed, well nourished, in no acute distress. Head: Normocephalic, contusion along right forehead, sclera non-icteric, no xanthomas, nares without discharge. Neck: Negative for carotid bruits. JVD not elevated. Lungs: Clear bilaterally to auscultation without wheezes, rales, or rhonchi. Breathing is unlabored. Heart: RRR with S1 S2. IV/VI systolic murmur RUSB, no rubs, or gallops appreciated. Abdomen: Soft, non-tender, non-distended with normoactive bowel sounds. No hepatomegaly. No rebound/guarding. No obvious abdominal masses. Msk:  Strength and tone appear normal for age. Extremities: No clubbing or cyanosis. No edema. Distal pedal pulses are 2+ and equal bilaterally. Neuro: Alert and oriented X 3. No facial asymmetry. No focal deficit. Moves all extremities spontaneously. Psych:  Responds to questions appropriately with a normal affect.   EKG:  The EKG was personally reviewed and demonstrates: NSR, 65 bpm, nonspecific st/t changes Telemetry:  Telemetry was personally reviewed and demonstrates: NSR  Weights: Filed Weights   05/31/17 1147  Weight: 188 lb (85.3 kg)    Relevant CV Studies: TTE 02/25/2017: Study Conclusions  - Left ventricle: The cavity size was normal. Wall thickness was   increased in a pattern of moderate LVH. Systolic function was   normal. The estimated ejection fraction was in the range of 60%   to 65%. Wall motion was normal; there were no regional wall   motion abnormalities. Doppler parameters are consistent with   abnormal left ventricular relaxation (grade 1 diastolic   dysfunction). The E/e&' ratio is >15, suggesting elevated LV   filling pressure. - Aortic valve: Moderately calcified. There is severe stenosis.   There was  trivial regurgitation. Mean gradient (S): 41 mm Hg.   Peak gradient (S): 63 mm Hg. Valve area (VTI): 0.89 cm^2. Valve   area (Vmax): 0.77 cm^2. Valve area (Vmean): 0.78 cm^2. - Aorta: Ascending aortic diameter: 44 mm (S). - Ascending aorta: The ascending aorta was moderately dilated. - Mitral valve: Calcified annulus. Mildly thickened leaflets .   There was trivial regurgitation. - Left atrium: Moderately dilated. - Right atrium:  Moderately dilated. - Atrial septum: The septum bows from left to right consistent with   Type 1R atrial septal aneurysm. - Tricuspid valve: There was trivial regurgitation. - Pulmonary arteries: PA peak pressure: 29 mm Hg (S). - Inferior vena cava: The vessel was normal in size. The   respirophasic diameter changes were in the normal range (= 50%),   consistent with normal central venous pressure.  Impressions:  - Compared to a prior study in 2017, there is now severe aortic   stenosis - the mean AV gradient has increased from 33 -> 41 mmHg.   Estimated AVA is around 0.8 cm2.. The LVEF is higher at 60-65%.   The ascending aorta is moderately dilated at 4.4 cm.  Laboratory Data:  Chemistry Recent Labs  Lab 05/31/17 1156 05/31/17 1626  NA 139  --   K 4.2  --   CL 105  --   CO2 26  --   GLUCOSE 110*  --   BUN 16  --   CREATININE 0.86 0.87  CALCIUM 9.4  --   GFRNONAA >60 >60  GFRAA >60 >60  ANIONGAP 8  --     No results for input(s): PROT, ALBUMIN, AST, ALT, ALKPHOS, BILITOT in the last 168 hours. Hematology Recent Labs  Lab 05/31/17 1156 05/31/17 1626  WBC 6.9 6.3  RBC 5.24 4.89  HGB 16.1 15.1  HCT 47.7 44.3  MCV 91.0 90.6  MCH 30.7 30.8  MCHC 33.8 34.0  RDW 13.6 13.5  PLT 122* 119*   Cardiac Enzymes Recent Labs  Lab 05/31/17 1156 05/31/17 1626 05/31/17 2211 06/01/17 0413  TROPONINI 0.06* 0.10* 0.07* 0.06*   No results for input(s): TROPIPOC in the last 168 hours.  BNPNo results for input(s): BNP, PROBNP in the last 168  hours.  DDimer No results for input(s): DDIMER in the last 168 hours.  Radiology/Studies:  Dg Chest 2 View  Result Date: 05/31/2017 IMPRESSION: No active cardiopulmonary disease. Electronically Signed   By: Dorise Bullion III M.D   On: 05/31/2017 13:57   Ct Head Wo Contrast  Result Date: 05/31/2017 IMPRESSION: No acute intracranial abnormality. Soft tissue swelling over the right forehead. Electronically Signed   By: Dorise Bullion III M.D   On: 05/31/2017 13:56   US Carotid Bilateral  Result Date: 06/01/2017 IMPRESSION: Color duplex indicates minimal homogeneous plaque, with no hemodynamically significant stenosis by duplex criteria in the extracranial cerebrovascular circulation. Signed, Dulcy Fanny. Earleen Newport, DO [facility] Radiology Electronically Signed   By: Corrie Mckusick D.O.   On: 06/01/2017 07:46    Assessment and Plan:   1. Syncope: -Likely in the setting of severe aortic stenosis -Echo -Telemetry -Carotid ultrasound without hemodynamically significant stenosis  2. Bicuspid aortic valve with severe aortic stenosis: -Symptomatic  -Check echo -Will need updated R/LHC followed by evaluation for AVR pending results  -Discuss with MD   3. Elevated troponin: -Minimally elevated with a peak of 0.10 -Likely supply demand ischemia in the setting of the above -Echo pending -Will need R/LHC as above -ASA  4. Hyperglycemia: -Check A1c  5. Thrombocytopenia: -Per IM  6. HLD: -Lipitor -Recent lipid panel 02/2017 with LDL of 44  7. HTN: -Well controlled   For questions or updates, please contact [facility] Please consult [email or web address] for contact info under Cardiology/STEMI.   Signed, Christell Faith, PA-C [place] Pager: [phone] 06/01/2017, 9:46 AM

## 2017-06-01 NOTE — Discharge Instructions (Addendum)
Resume diet and activity as before  Follow-up with cardiothoracic surgeon

## 2017-06-01 NOTE — H&P (View-Only) (Signed)
Cardiology Consultation:   Patient ID: Todd Frank; [id number]; 22-Mar-1956   Admit date: 05/31/2017 Date of Consult: 06/01/2017  Primary Care Provider: Olin Hauser, DO Primary Cardiologist: Radford Pax   Patient Profile:   Todd Frank is a 62 y.o. male with a hx of CAD medically managed as detailed below, bicuspid aortic valve with severe aortic stenosis, chronic diastolic CHF, mildly dilated aortic root, HTN, and HLD who is being seen today for the evaluation of syncope in the setting of severe aortic stenosis at the request of Dr. Jerelyn Charles.  History of Present Illness:   Todd Frank previously underwent LHC in 02/2016 for workup of his aortic stenosis that showed an occluded RCA supplied by LCX collaterals, 40% proximal RCA, 80% marginal, 60% OM3, 90% mid to distal LAD (does not reach the apex and small vessel), 60% D1, 50% OM1, 60% mid LAD, 70% PDA, moderate aortic stenosis, normal LV function. Medical management was advised. He was last seen by [name] on 05/28/2016 for routine follow up and was doing well at that time. Follow up echo in 02/2017 showed EF 60-65%, no RWMA, Gr1DD, severe aortic stenosis with trivial regurgitation (mean gradient (S): 41 mm Hg, peak gradient (S): 63 mm Hg, valve area (VTI): 0.89 cm^2,  Valve area (Vmax): 0.77 cm^2, valve area (Vmean): 0.78 cm^2), ascending aorta moderately dilated at 44 mm, trivial MR, moderate biatrial enlargement, type 1R atrial septal aneurysm, PASP 29 mmHg. Follow up appointment was advised.   Patient has remained very active at baseline, exercising 5-6 days per week. Reports he typically feels his best with exercise. He was out for his usual walk/jog on 2/24 when he was nearing completion. He started a final jogging segment and decided to push it a little. He got his heart rate up to 177 bpm per his FitBit. Following this, he was dizzy so he sat down on a bench. This was followed by LOC. No chest pain, palpitations, nausea,  vomiting, or diaphoresis. He does not think he was without consciousness for a long time. He was able to ambulate back home on his own without issues. Because of this episode, he presented to [facility] for further evaluation.   Upon the patient's arrival to [facility] they were found to have stable BP and HR, oxygen saturation 100% on room air, weight 188 pounds. EKG showed nonspecific changes as below, CXR showed no active cardiopulmonary disease, CT head without acute abnormality with soft tissue swelling over the right forehead, carotid ultrasound without hemodynamic stenosis. Labs showed troponin 0.06-->0.10-->0.07-->0.06, TSH normal, WBC 6.9, HGB 16.1, PLT 122, Na 139, K+ 4.2, SCr 0.86, glucose 110. He was given ASA and lactated ringers upon admission. Cardiology was asked to evaluate. Currently, feels back to his baseline.    Past Medical History:  Diagnosis Date  . Aortic stenosis    a. mod-severe by echo 02/2016 but moderate by cath 02/2016  . Bicuspid aortic valve   . Bradycardia 07/23/2015  . CAD (coronary artery disease), native coronary artery    cath 02/2016 showing  Normal LVF, moderate AS, occluded distal RCA supplied by LCx collaterals, 40% prox RCA, 80% acute marginal, 60% OM3, 90% mid to distal LAD (does not reach the apex and small), 60% D1, 50% OM1, 60% mid LAD, 70% PDA - medical management was felt to be best option.    . Carotid bruit    carotid dopplers negative.  Bruit due to heart murmur from AS  . Congenital nevus   .  Diastolic dysfunction   . Dilated aortic root ([place])    71mm by echo 02/2016  . GERD (gastroesophageal reflux disease)   . HTN (hypertension)   . Hyperlipidemia    LDL goal < 70  . LVH (left ventricular hypertrophy)   . Melanoma of skin ([place])   . Obesity   . PFO (patent foramen ovale)    small by echo 2014    Past Surgical History:  Procedure Laterality Date  . CARDIAC CATHETERIZATION N/A 02/20/2016   Procedure: Right/Left Heart Cath and Coronary  Angiography;  Surgeon: Belva Crome, MD;  Location: [place] CV LAB;  Service: Cardiovascular;  Laterality: N/A;  . EYE SURGERY    . MELANOMA EXCISION  2011   left thigh  . TONSILLECTOMY       Home Meds: Prior to Admission medications   Medication Sig Start Date End Date Taking? Authorizing Provider  aspirin 81 MG tablet Take 81 mg by mouth daily.   Yes [provider]  atorvastatin (LIPITOR) 80 MG tablet Take 1 tablet (80 mg total) by mouth daily. 06/23/16  Yes [name], [name], MD  Coenzyme Q10 (CO Q 10 PO) Take 1 tablet by mouth daily.    Yes [provider]  fish oil-omega-3 fatty acids 1000 MG capsule Take 2,400 mg by mouth daily.    Yes [provider]  amLODipine (NORVASC) 2.5 MG tablet Take 1 tablet (2.5 mg total) by mouth daily. Please make overdue yearly appt with [name]. 2nd attempt 06/01/17   Todd Margarita, MD  fluticasone (FLONASE) 50 MCG/ACT nasal spray Place 2 sprays into both nostrils daily. 08/25/16   Valerie Roys, DO    Inpatient Medications: Scheduled Meds: . aspirin EC  81 mg Oral Daily  . atorvastatin  80 mg Oral Daily  . enoxaparin (LOVENOX) injection  40 mg Subcutaneous Q24H   Continuous Infusions:  PRN Meds: acetaminophen **OR** acetaminophen, HYDROcodone-acetaminophen, morphine injection, nitroGLYCERIN, ondansetron **OR** ondansetron (ZOFRAN) IV, polyethylene glycol  Allergies:  No Known Allergies  Social History:   Social History   Socioeconomic History  . Marital status: Married    Spouse name: Not on file  . Number of children: Not on file  . Years of education: Not on file  . Highest education level: Not on file  Social Needs  . Financial resource strain: Not hard at all  . Food insecurity - worry: Never true  . Food insecurity - inability: Never true  . Transportation needs - medical: No  . Transportation needs - non-medical: No  Occupational History  . Occupation: Government social research officer - [employer]  Tobacco Use   . Smoking status: Former Research scientist (life sciences)  . Smokeless tobacco: Never Used  Substance and Sexual Activity  . Alcohol use: Yes    Alcohol/week: 0.6 oz    Types: 1 Cans of beer per week  . Drug use: No  . Sexual activity: Yes  Other Topics Concern  . Not on file  Social History Narrative  . Not on file     Family History:   Family History  Problem Relation Age of Onset  . Diabetes Mother   . Arthritis Father   . Heart disease Paternal Grandmother        MI  . Heart attack Unknown        family history  . Pancreatic cancer Maternal Aunt     ROS:  Review of Systems  Constitutional: Positive for malaise/fatigue. Negative for chills, diaphoresis, fever and weight loss.  HENT: Negative for congestion.   Eyes: Negative for discharge and redness.  Respiratory: Negative for cough, hemoptysis, sputum production, shortness of breath and wheezing.   Cardiovascular: Negative for chest pain, palpitations, orthopnea, claudication, leg swelling and PND.  Gastrointestinal: Negative for abdominal pain, blood in stool, heartburn, melena, nausea and vomiting.  Genitourinary: Negative for hematuria.  Musculoskeletal: Positive for falls. Negative for myalgias.  Skin: Negative for rash.  Neurological: Positive for loss of consciousness, weakness and headaches. Negative for dizziness, tingling, tremors, sensory change, speech change and focal weakness.  Endo/Heme/Allergies: Does not bruise/bleed easily.  Psychiatric/Behavioral: Negative for substance abuse. The patient is not nervous/anxious.   All other systems reviewed and are negative.     Physical Exam/Data:   Vitals:   05/31/17 1530 05/31/17 1556 05/31/17 2019 06/01/17 0533  BP: 133/84 (!) 142/73 134/83 135/76  Pulse: (!) 57 61 67 (!) 56  Resp: 14     Temp:  98.3 F (36.8 C) 98.3 F (36.8 C) 97.9 F (36.6 C)  TempSrc:  Oral Oral Oral  SpO2: 98% 99% 96% 98%  Weight:      Height:        Intake/Output Summary (Last 24 hours) at 06/01/2017  0946 Last data filed at 06/01/2017 0300 Gross per 24 hour  Intake 920 ml  Output -  Net 920 ml   Filed Weights   05/31/17 1147  Weight: 188 lb (85.3 kg)   Body mass index is 27.76 kg/m.   Physical Exam: General: Well developed, well nourished, in no acute distress. Head: Normocephalic, contusion along right forehead, sclera non-icteric, no xanthomas, nares without discharge. Neck: Negative for carotid bruits. JVD not elevated. Lungs: Clear bilaterally to auscultation without wheezes, rales, or rhonchi. Breathing is unlabored. Heart: RRR with S1 S2. IV/VI systolic murmur RUSB, no rubs, or gallops appreciated. Abdomen: Soft, non-tender, non-distended with normoactive bowel sounds. No hepatomegaly. No rebound/guarding. No obvious abdominal masses. Msk:  Strength and tone appear normal for age. Extremities: No clubbing or cyanosis. No edema. Distal pedal pulses are 2+ and equal bilaterally. Neuro: Alert and oriented X 3. No facial asymmetry. No focal deficit. Moves all extremities spontaneously. Psych:  Responds to questions appropriately with a normal affect.   EKG:  The EKG was personally reviewed and demonstrates: NSR, 65 bpm, nonspecific st/t changes Telemetry:  Telemetry was personally reviewed and demonstrates: NSR  Weights: Filed Weights   05/31/17 1147  Weight: 188 lb (85.3 kg)    Relevant CV Studies: TTE 02/25/2017: Study Conclusions  - Left ventricle: The cavity size was normal. Wall thickness was   increased in a pattern of moderate LVH. Systolic function was   normal. The estimated ejection fraction was in the range of 60%   to 65%. Wall motion was normal; there were no regional wall   motion abnormalities. Doppler parameters are consistent with   abnormal left ventricular relaxation (grade 1 diastolic   dysfunction). The E/e&' ratio is >15, suggesting elevated LV   filling pressure. - Aortic valve: Moderately calcified. There is severe stenosis.   There was  trivial regurgitation. Mean gradient (S): 41 mm Hg.   Peak gradient (S): 63 mm Hg. Valve area (VTI): 0.89 cm^2. Valve   area (Vmax): 0.77 cm^2. Valve area (Vmean): 0.78 cm^2. - Aorta: Ascending aortic diameter: 44 mm (S). - Ascending aorta: The ascending aorta was moderately dilated. - Mitral valve: Calcified annulus. Mildly thickened leaflets .   There was trivial regurgitation. - Left atrium: Moderately dilated. - Right atrium:  Moderately dilated. - Atrial septum: The septum bows from left to right consistent with   Type 1R atrial septal aneurysm. - Tricuspid valve: There was trivial regurgitation. - Pulmonary arteries: PA peak pressure: 29 mm Hg (S). - Inferior vena cava: The vessel was normal in size. The   respirophasic diameter changes were in the normal range (= 50%),   consistent with normal central venous pressure.  Impressions:  - Compared to a prior study in 2017, there is now severe aortic   stenosis - the mean AV gradient has increased from 33 -> 41 mmHg.   Estimated AVA is around 0.8 cm2.. The LVEF is higher at 60-65%.   The ascending aorta is moderately dilated at 4.4 cm.  Laboratory Data:  Chemistry Recent Labs  Lab 05/31/17 1156 05/31/17 1626  NA 139  --   K 4.2  --   CL 105  --   CO2 26  --   GLUCOSE 110*  --   BUN 16  --   CREATININE 0.86 0.87  CALCIUM 9.4  --   GFRNONAA >60 >60  GFRAA >60 >60  ANIONGAP 8  --     No results for input(s): PROT, ALBUMIN, AST, ALT, ALKPHOS, BILITOT in the last 168 hours. Hematology Recent Labs  Lab 05/31/17 1156 05/31/17 1626  WBC 6.9 6.3  RBC 5.24 4.89  HGB 16.1 15.1  HCT 47.7 44.3  MCV 91.0 90.6  MCH 30.7 30.8  MCHC 33.8 34.0  RDW 13.6 13.5  PLT 122* 119*   Cardiac Enzymes Recent Labs  Lab 05/31/17 1156 05/31/17 1626 05/31/17 2211 06/01/17 0413  TROPONINI 0.06* 0.10* 0.07* 0.06*   No results for input(s): TROPIPOC in the last 168 hours.  BNPNo results for input(s): BNP, PROBNP in the last 168  hours.  DDimer No results for input(s): DDIMER in the last 168 hours.  Radiology/Studies:  Dg Chest 2 View  Result Date: 05/31/2017 IMPRESSION: No active cardiopulmonary disease. Electronically Signed   By: Dorise Bullion III M.D   On: 05/31/2017 13:57   Ct Head Wo Contrast  Result Date: 05/31/2017 IMPRESSION: No acute intracranial abnormality. Soft tissue swelling over the right forehead. Electronically Signed   By: Dorise Bullion III M.D   On: 05/31/2017 13:56   US Carotid Bilateral  Result Date: 06/01/2017 IMPRESSION: Color duplex indicates minimal homogeneous plaque, with no hemodynamically significant stenosis by duplex criteria in the extracranial cerebrovascular circulation. Signed, Dulcy Fanny. Earleen Newport, DO [facility] Rad[facility] Electronically Signed   By: Corrie Mckusick D.O.   On: 06/01/2017 07:46    Assessment and Plan:   1. Syncope: -Likely in the setting of severe aortic stenosis -Echo -Telemetry -Carotid ultrasound without hemodynamically significant stenosis  2. Bicuspid aortic valve with severe aortic stenosis: -Symptomatic  -Check echo -Will need updated R/LHC followed by evaluation for AVR pending results  -Discuss with MD   3. Elevated troponin: -Minimally elevated with a peak of 0.10 -Likely supply demand ischemia in the setting of the above -Echo pending -Will need R/LHC as above -ASA  4. Hyperglycemia: -Check A1c  5. Thrombocytopenia: -Per IM  6. HLD: -Lipitor -Recent lipid panel 02/2017 with LDL of 44  7. HTN: -Well controlled   For questions or updates, please contact [facility] Please consult [email or web address] for contact info under Cardiology/STEMI.   Signed, Christell Faith, PA-C [place] Pager: [phone] 06/01/2017, 9:46 AM

## 2017-06-01 NOTE — Plan of Care (Signed)
  Progressing Education: Knowledge of General Education information will improve 06/01/2017 1455 - Progressing by Cherylann Parr, RN Health Behavior/Discharge Planning: Ability to manage health-related needs will improve 06/01/2017 1455 - Progressing by Cherylann Parr, RN Clinical Measurements: Ability to maintain clinical measurements within normal limits will improve 06/01/2017 1455 - Progressing by Cherylann Parr, RN Will remain free from infection 06/01/2017 1455 - Progressing by Cherylann Parr, RN Diagnostic test results will improve 06/01/2017 1455 - Progressing by Cherylann Parr, RN Respiratory complications will improve 06/01/2017 1455 - Progressing by Cherylann Parr, RN Cardiovascular complication will be avoided 06/01/2017 1455 - Progressing by Cherylann Parr, RN Activity: Risk for activity intolerance will decrease 06/01/2017 1455 - Progressing by Cherylann Parr, RN Nutrition: Adequate nutrition will be maintained 06/01/2017 1455 - Progressing by Cherylann Parr, RN Coping: Level of anxiety will decrease 06/01/2017 1455 - Progressing by Cherylann Parr, RN Elimination: Will not experience complications related to bowel motility 06/01/2017 1455 - Progressing by Cherylann Parr, RN Will not experience complications related to urinary retention 06/01/2017 1455 - Progressing by Cherylann Parr, RN Pain Managment: General experience of comfort will improve 06/01/2017 1455 - Progressing by Cherylann Parr, RN Safety: Ability to remain free from injury will improve 06/01/2017 1455 - Progressing by Cherylann Parr, RN Skin Integrity: Risk for impaired skin integrity will decrease 06/01/2017 1455 - Progressing by Cherylann Parr, RN

## 2017-06-01 NOTE — Telephone Encounter (Signed)
Patient admitted at [facility] with syncope and severe AS.  He will be cathed tomorrow - please get him set up with CVTS surgery for later this week in their office

## 2017-06-02 ENCOUNTER — Encounter: Payer: Self-pay | Admitting: Internal Medicine

## 2017-06-02 ENCOUNTER — Encounter
Admission: EM | Disposition: A | Discharge status: Home / Self Care | Payer: Self-pay | Source: Home / Self Care | Attending: Internal Medicine

## 2017-06-02 DIAGNOSIS — W19XXXA Unspecified fall, initial encounter: Secondary | ICD-10-CM | POA: Diagnosis present

## 2017-06-02 DIAGNOSIS — K219 Gastro-esophageal reflux disease without esophagitis: Secondary | ICD-10-CM | POA: Diagnosis present

## 2017-06-02 DIAGNOSIS — Z85820 Personal history of malignant melanoma of skin: Secondary | ICD-10-CM | POA: Diagnosis not present

## 2017-06-02 DIAGNOSIS — Z833 Family history of diabetes mellitus: Secondary | ICD-10-CM | POA: Diagnosis not present

## 2017-06-02 DIAGNOSIS — E7800 Pure hypercholesterolemia, unspecified: Secondary | ICD-10-CM | POA: Diagnosis present

## 2017-06-02 DIAGNOSIS — D696 Thrombocytopenia, unspecified: Secondary | ICD-10-CM | POA: Diagnosis present

## 2017-06-02 DIAGNOSIS — Z800 Family history of malignant neoplasm of digestive organs: Secondary | ICD-10-CM | POA: Diagnosis not present

## 2017-06-02 DIAGNOSIS — I350 Nonrheumatic aortic (valve) stenosis: Secondary | ICD-10-CM | POA: Diagnosis present

## 2017-06-02 DIAGNOSIS — Z8261 Family history of arthritis: Secondary | ICD-10-CM | POA: Diagnosis not present

## 2017-06-02 DIAGNOSIS — E785 Hyperlipidemia, unspecified: Secondary | ICD-10-CM | POA: Diagnosis present

## 2017-06-02 DIAGNOSIS — R55 Syncope and collapse: Secondary | ICD-10-CM

## 2017-06-02 DIAGNOSIS — R739 Hyperglycemia, unspecified: Secondary | ICD-10-CM | POA: Diagnosis present

## 2017-06-02 DIAGNOSIS — R011 Cardiac murmur, unspecified: Secondary | ICD-10-CM | POA: Diagnosis present

## 2017-06-02 DIAGNOSIS — I110 Hypertensive heart disease with heart failure: Secondary | ICD-10-CM | POA: Diagnosis present

## 2017-06-02 DIAGNOSIS — E663 Overweight: Secondary | ICD-10-CM | POA: Diagnosis present

## 2017-06-02 DIAGNOSIS — Z8249 Family history of ischemic heart disease and other diseases of the circulatory system: Secondary | ICD-10-CM | POA: Diagnosis not present

## 2017-06-02 DIAGNOSIS — E669 Obesity, unspecified: Secondary | ICD-10-CM | POA: Diagnosis present

## 2017-06-02 DIAGNOSIS — I5032 Chronic diastolic (congestive) heart failure: Secondary | ICD-10-CM | POA: Diagnosis present

## 2017-06-02 DIAGNOSIS — Z7982 Long term (current) use of aspirin: Secondary | ICD-10-CM | POA: Diagnosis not present

## 2017-06-02 DIAGNOSIS — I248 Other forms of acute ischemic heart disease: Secondary | ICD-10-CM | POA: Diagnosis present

## 2017-06-02 DIAGNOSIS — Z79899 Other long term (current) drug therapy: Secondary | ICD-10-CM | POA: Diagnosis not present

## 2017-06-02 DIAGNOSIS — Z87891 Personal history of nicotine dependence: Secondary | ICD-10-CM | POA: Diagnosis not present

## 2017-06-02 DIAGNOSIS — Q211 Atrial septal defect: Secondary | ICD-10-CM | POA: Diagnosis not present

## 2017-06-02 DIAGNOSIS — I2510 Atherosclerotic heart disease of native coronary artery without angina pectoris: Secondary | ICD-10-CM | POA: Diagnosis present

## 2017-06-02 DIAGNOSIS — I77810 Thoracic aortic ectasia: Secondary | ICD-10-CM | POA: Diagnosis present

## 2017-06-02 HISTORY — PX: RIGHT HEART CATH: CATH118263

## 2017-06-02 HISTORY — PX: CORONARY/GRAFT ANGIOGRAPHY: CATH118237

## 2017-06-02 LAB — HIV ANTIBODY (ROUTINE TESTING W REFLEX): HIV Screen 4th Generation wRfx: NONREACTIVE

## 2017-06-02 LAB — HEMOGLOBIN A1C
Hgb A1c MFr Bld: 5.2 % (ref 4.8–5.6)
Mean Plasma Glucose: 103 mg/dL

## 2017-06-02 SURGERY — RIGHT HEART CATH

## 2017-06-02 MED ORDER — SODIUM CHLORIDE 0.9% FLUSH: 3.0000 mL | Freq: Two times a day (BID) | INTRAVENOUS | Status: DC

## 2017-06-02 MED ORDER — FENTANYL CITRATE (PF) 100 MCG/2ML IJ SOLN
INTRAMUSCULAR | Status: DC | PRN
  Administered 2017-06-02: 25 ug via INTRAVENOUS

## 2017-06-02 MED ORDER — SODIUM CHLORIDE 0.9% FLUSH: 3.0000 mL | INTRAVENOUS | Status: DC | PRN

## 2017-06-02 MED ORDER — HEPARIN SODIUM (PORCINE) 1000 UNIT/ML IJ SOLN
INTRAMUSCULAR | Status: DC | PRN
Start: 2017-06-02 — End: 2017-06-02
  Administered 2017-06-02: 4000 [IU] via INTRAVENOUS

## 2017-06-02 MED ORDER — SODIUM CHLORIDE 0.9 % IV SOLN: INTRAVENOUS | Status: AC

## 2017-06-02 MED ORDER — ASPIRIN 81 MG PO CHEW: 81.0000 mg | CHEWABLE_TABLET | ORAL | Status: DC

## 2017-06-02 MED ORDER — HEPARIN (PORCINE) IN NACL 2-0.9 UNIT/ML-% IJ SOLN
INTRAMUSCULAR | Status: AC
  Filled 2017-06-02: qty 500

## 2017-06-02 MED ORDER — HEPARIN SODIUM (PORCINE) 1000 UNIT/ML IJ SOLN
INTRAMUSCULAR | Status: AC
  Filled 2017-06-02: qty 1

## 2017-06-02 MED ORDER — SODIUM CHLORIDE 0.9 % IV SOLN
INTRAVENOUS | Status: DC
  Administered 2017-06-02: 09:00:00 via INTRAVENOUS

## 2017-06-02 MED ORDER — SODIUM CHLORIDE 0.9 % IV SOLN
INTRAVENOUS | Status: AC | PRN
  Administered 2017-06-02: 250 mL via INTRAVENOUS

## 2017-06-02 MED ORDER — SODIUM CHLORIDE 0.9 % IV SOLN: 250.0000 mL | INTRAVENOUS | Status: DC | PRN

## 2017-06-02 MED ORDER — MIDAZOLAM HCL 2 MG/2ML IJ SOLN
INTRAMUSCULAR | Status: DC | PRN
  Administered 2017-06-02: 1 mg via INTRAVENOUS

## 2017-06-02 MED ORDER — ENOXAPARIN SODIUM 40 MG/0.4ML ~~LOC~~ SOLN
40.0000 mg | SUBCUTANEOUS | Status: DC
  Filled 2017-06-02: qty 0.4

## 2017-06-02 MED ORDER — IOPAMIDOL (ISOVUE-300) INJECTION 61%
INTRAVENOUS | Status: DC | PRN
  Administered 2017-06-02: 65 mL via INTRA_ARTERIAL

## 2017-06-02 MED ORDER — FENTANYL CITRATE (PF) 100 MCG/2ML IJ SOLN
INTRAMUSCULAR | Status: AC
  Filled 2017-06-02: qty 2

## 2017-06-02 MED ORDER — VERAPAMIL HCL 2.5 MG/ML IV SOLN
INTRAVENOUS | Status: AC
  Filled 2017-06-02: qty 2

## 2017-06-02 MED ORDER — MIDAZOLAM HCL 2 MG/2ML IJ SOLN
INTRAMUSCULAR | Status: AC
  Filled 2017-06-02: qty 2

## 2017-06-02 SURGICAL SUPPLY — 8 items
CATH BALLN WEDGE 5F 110CM (CATHETERS) ×2 IMPLANT
CATH INFINITI 5 FR JL3.5 (CATHETERS) ×2 IMPLANT
GLIDESHEATH SLEND SS 6F .021 (SHEATH) ×2 IMPLANT
KIT MANI 3VAL PERCEP (MISCELLANEOUS) ×2 IMPLANT
KIT RIGHT HEART (MISCELLANEOUS) ×2 IMPLANT
PACK CARDIAC CATH (CUSTOM PROCEDURE TRAY) ×2 IMPLANT
SHEATH GLIDE SLENDER 4/5FR (SHEATH) ×2 IMPLANT
WIRE ROSEN-J .035X260CM (WIRE) ×2 IMPLANT

## 2017-06-02 NOTE — Progress Notes (Signed)
Progress Note  Patient Name: Todd Frank Date of Encounter: 06/02/2017  Primary Cardiologist: No primary care provider on file.   Subjective   Todd Frank feels well.  He denies further lightheadedness.  Denies chest pain, shortness of breath, and edema.  Inpatient Medications    Scheduled Meds: . [MAR Hold] aspirin EC  81 mg Oral Daily  . [MAR Hold] atorvastatin  80 mg Oral Daily  . [START ON 06/03/2017] enoxaparin (LOVENOX) injection  40 mg Subcutaneous Q24H  . sodium chloride flush  3 mL Intravenous Q12H   Continuous Infusions: . sodium chloride 125 mL/hr at 06/02/17 1106  . sodium chloride     PRN Meds: sodium chloride, [MAR Hold] acetaminophen **OR** [MAR Hold] acetaminophen, [MAR Hold] HYDROcodone-acetaminophen, [MAR Hold]  morphine injection, [MAR Hold] ondansetron **OR** [MAR Hold] ondansetron (ZOFRAN) IV, [MAR Hold] polyethylene glycol, sodium chloride flush   Vital Signs    Vitals:   06/02/17 1108 06/02/17 1115 06/02/17 1130 06/02/17 1145  BP: 125/81 138/87 139/89 127/81  Pulse: 63 69 74 70  Resp: 17 20 (!) 24 17  Temp:      TempSrc:      SpO2: 99% 97% 96% 95%  Weight:      Height:       No intake or output data in the 24 hours ending 06/02/17 1159 Filed Weights   05/31/17 1147  Weight: 188 lb (85.3 kg)    Telemetry    Normal sinus rhythm and sinus bradycardia with isolated PVCs - Personally Reviewed  ECG   No new tracing  Physical Exam   GEN: No acute distress.  Facial lacerations and periorbital ecchymosis noted. Neck: No JVD Cardiac: RRR with 2/6 crescendo-decrescendo murmur loudest at the left upper sternal border.  No rubs or gallops. Respiratory: Clear to auscultation bilaterally. GI: Soft, nontender, non-distended  MS: No edema; No deformity. Neuro:  Nonfocal  Psych: Normal affect   Labs    Chemistry Recent Labs  Lab 05/31/17 1156 05/31/17 1626  NA 139  --   K 4.2  --   CL 105  --   CO2 26  --   GLUCOSE 110*  --   BUN 16   --   CREATININE 0.86 0.87  CALCIUM 9.4  --   GFRNONAA >60 >60  GFRAA >60 >60  ANIONGAP 8  --      Hematology Recent Labs  Lab 05/31/17 1156 05/31/17 1626  WBC 6.9 6.3  RBC 5.24 4.89  HGB 16.1 15.1  HCT 47.7 44.3  MCV 91.0 90.6  MCH 30.7 30.8  MCHC 33.8 34.0  RDW 13.6 13.5  PLT 122* 119*    Cardiac Enzymes Recent Labs  Lab 05/31/17 1156 05/31/17 1626 05/31/17 2211 06/01/17 0413  TROPONINI 0.06* 0.10* 0.07* 0.06*   No results for input(s): TROPIPOC in the last 168 hours.   BNPNo results for input(s): BNP, PROBNP in the last 168 hours.   DDimer No results for input(s): DDIMER in the last 168 hours.   Radiology    Dg Chest 2 View  Result Date: 05/31/2017 CLINICAL DATA:  Nonsmoker.  Syncope. EXAM: CHEST  2 VIEW COMPARISON:  Aug 07, 2015 FINDINGS: The heart size and mediastinal contours are within normal limits. Both lungs are clear. The visualized skeletal structures are unremarkable. IMPRESSION: No active cardiopulmonary disease. Electronically Signed   By: Dorise Bullion III M.D   On: 05/31/2017 13:57   Ct Head Wo Contrast  Result Date: 05/31/2017 CLINICAL DATA:  Syncope.  Hematoma right forehead. EXAM: CT HEAD WITHOUT CONTRAST TECHNIQUE: Contiguous axial images were obtained from the base of the skull through the vertex without intravenous contrast. COMPARISON:  None. FINDINGS: Brain: No subdural, epidural, or subarachnoid hemorrhage. Cerebellum, brainstem, and basal cisterns are normal. Ventricles and sulci are unremarkable. No acute cortical ischemia or infarct. No other acute abnormalities. No mass effect or midline shift. Vascular: No hyperdense vessel or unexpected calcification. Skull: Normal. Negative for fracture or focal lesion. Sinuses/Orbits: No acute finding. Other: Soft tissue swelling over the right side of the forehead. Extracranial soft tissues are otherwise normal. IMPRESSION: No acute intracranial abnormality. Soft tissue swelling over the right  forehead. Electronically Signed   By: Dorise Bullion III M.D   On: 05/31/2017 13:56   US Carotid Bilateral  Result Date: 06/01/2017 CLINICAL DATA:  62 year old male with a history of syncope and collapse. Cardiovascular risk factors include hypertension, hyperlipidemia EXAM: BILATERAL CAROTID DUPLEX ULTRASOUND TECHNIQUE: Pearline Cables scale imaging, color Doppler and duplex ultrasound were performed of bilateral carotid and vertebral arteries in the neck. COMPARISON:  No prior duplex FINDINGS: Criteria: Quantification of carotid stenosis is based on velocity parameters that correlate the residual internal carotid diameter with NASCET-based stenosis levels, using the diameter of the distal internal carotid lumen as the denominator for stenosis measurement. The following velocity measurements were obtained: RIGHT ICA:  Systolic 69 cm/sec, Diastolic 28 cm/sec CCA:  57 cm/sec SYSTOLIC ICA/CCA RATIO:  1.2 ECA:  53 cm/sec LEFT ICA:  Systolic 8 65 cm/sec, Diastolic 27 cm/sec CCA:  68 cm/sec SYSTOLIC ICA/CCA RATIO:  0.9 ECA:  54 cm/sec Right Brachial SBP: Not acquired Left Brachial SBP: Not acquired RIGHT CAROTID ARTERY: No significant calcified disease of the right common carotid artery. Intermediate waveform maintained. Homogeneous plaque without significant calcifications at the right carotid bifurcation. Low resistance waveform of the right ICA. No significant tortuosity. RIGHT VERTEBRAL ARTERY: Antegrade flow with low resistance waveform. LEFT CAROTID ARTERY: No significant calcified disease of the left common carotid artery. Intermediate waveform maintained. Homogeneous plaque at the left carotid bifurcation without significant calcifications. Low resistance waveform of the left ICA. LEFT VERTEBRAL ARTERY:  Antegrade flow with low resistance waveform. IMPRESSION: Color duplex indicates minimal homogeneous plaque, with no hemodynamically significant stenosis by duplex criteria in the extracranial cerebrovascular  circulation. Signed, Dulcy Fanny. Earleen Newport, DO [facility] Radiology Electronically Signed   By: Corrie Mckusick D.O.   On: 06/01/2017 07:46    Cardiac Studies   Right heart catheterization and coronary angiography (06/02/17): 1. Significant 2-vessel coronary artery disease involving the LAD and distal RCA/PDA, not significantly changed since prior catheterization in 02/2016. 2. Normal left heart, right heart, and pulmonary artery pressures. 3. Normal Fick cardiac output/index. 4. Heavily calcified aortic valve with limited mobility on fluoroscopic images.  Patient Profile     62 y.o. male coronary artery disease, bicuspid aortic valve with severe aortic stenosis, chronic diastolic heart failure, hypertension, and hyperlipidemia, admitted with syncope after running.  Assessment & Plan    Syncope Main concern is for transient hypotension in the setting of severe aortic stenosis.  Ventricular arrhythmia is conceivable, given significant two-vessel coronary artery disease.  No sustained arrhythmias noted following admission.  Patient instructed to avoid strenuous activity and driving.  Surgical consultation as an outpatient for management of aortic valve and coronary disease.  Severe aortic stenosis with history of bicuspid aortic valve Mean gradient 46 on echo during this admission.  Avoid strenuous activity.  Outpatient cardiac surgery consultation.  Coronary artery disease without angina Patient without chest pain or shortness of breath.  Cardiac catheterization shows relatively stable findings with severely, diffusely diseased mid LAD which terminates prior to the apex.  Significant distal RCA/PDA disease also noted.  Mild troponin elevation on admission likely reflect supply-demand mismatch in the setting of two-vessel CAD and aortic stenosis.  Continue aggressive secondary prevention, including high intensity statin therapy.  Minimize  vasodilators, including nitrates, if possible, given severe aortic stenosis.  Proceed with outpatient cardiac surgery consultation and follow-up with [name].  Hyperlipidemia Goal LDL less than 70.  Continue atorvastatin 80 mg daily.  Hypertension Blood pressure well controlled.  Amlodipine currently on hold.  Hold amlodipine for now in the setting of severe aortic stenosis and recent syncope.  Recommend avoiding vasodilators pending aortic valve intervention.  Disposition  Anticipate discharge home today after recovery from diagnostic catheterization.  Proceed with outpatient cardiac surgery consultation, being arranged by Dr.[name]'s office.   For questions or updates, please contact [place] Please consult [email or web address] for contact info under [facility] Cardiology.     Signed, Nelva Bush, MD  06/02/2017, 11:59 AM

## 2017-06-02 NOTE — Progress Notes (Signed)
Discharge instructions given and went over with patient and patients wife at bedside. Follow-up appointment reviewed. All questions answered. Patient discharged home with wife via wheelchair by volunteer services. Madlyn Frankel, RN

## 2017-06-02 NOTE — Addendum Note (Signed)
Addended by: Frederik Schmidt on: 06/02/2017 08:18 AM   Modules accepted: Orders

## 2017-06-02 NOTE — Interval H&P Note (Signed)
History and Physical Interval Note:  06/02/2017 9:42 AM  Todd Frank JERIK FALLETTA  has presented today for cardiac catheterization, with the diagnosis of syncope and severe aortic stenosis. The various methods of treatment have been discussed with the patient and family. After consideration of risks, benefits and other options for treatment, the patient has consented to  Procedure(s): RIGHT/LEFT HEART CATH AND CORONARY ANGIOGRAPHY (N/A) as a surgical intervention .  The patient's history has been reviewed, patient examined, no change in status, stable for surgery.  I have reviewed the patient's chart and labs.  Questions were answered to the patient's satisfaction.    Cath Lab Visit (complete for each Cath Lab visit)  Clinical Evaluation Leading to the Procedure:   ACS: No.  Non-ACS:    Anginal Classification: No Symptoms (syncope with vigorous exercise in the setting of severe aortic stenosis)  Anti-ischemic medical therapy: Minimal Therapy (1 class of medications)  Non-Invasive Test Results: No non-invasive testing performed  Prior CABG: No previous CABG  Reilly Blades

## 2017-06-02 NOTE — Progress Notes (Signed)
[facility] [place] NAME: Todd Frank    MR#:  [id number]  DATE OF BIRTH:  1955-07-31  SUBJECTIVE:  CHIEF COMPLAINT:   Chief Complaint  Patient presents with  . Loss of Consciousness   No further syncope No CP/SOB  REVIEW OF SYSTEMS:    Review of Systems  Constitutional: Negative for chills and fever.  HENT: Negative for sore throat.   Eyes: Negative for blurred vision, double vision and pain.  Respiratory: Negative for cough, hemoptysis, shortness of breath and wheezing.   Cardiovascular: Negative for chest pain, palpitations, orthopnea and leg swelling.  Gastrointestinal: Negative for abdominal pain, constipation, diarrhea, heartburn, nausea and vomiting.  Genitourinary: Negative for dysuria and hematuria.  Musculoskeletal: Negative for back pain and joint pain.  Skin: Negative for rash.  Neurological: Negative for sensory change, speech change, focal weakness and headaches.  Endo/Heme/Allergies: Does not bruise/bleed easily.  Psychiatric/Behavioral: Negative for depression. The patient is not nervous/anxious.     DRUG ALLERGIES:  No Known Allergies  VITALS:  Blood pressure 139/84, pulse 63, temperature 97.7 F (36.5 C), temperature source Oral, resp. rate 18, height 5\' 9"  (1.753 m), weight 85.3 kg (188 lb), SpO2 98 %.  PHYSICAL EXAMINATION:   Physical Exam  GENERAL:  62 y.o.-year-old patient lying in the bed with no acute distress.  EYES: Pupils equal, round, reactive to light and accommodation. No scleral icterus. Extraocular muscles intact.  HEENT: Head atraumatic, normocephalic. Oropharynx and nasopharynx clear.  NECK:  Supple, no jugular venous distention. No thyroid enlargement, no tenderness.  LUNGS: Normal breath sounds bilaterally, no wheezing, rales, rhonchi. No use of accessory muscles of respiration.  CARDIOVASCULAR: S1, S2, ESM ABDOMEN: Soft, nontender, nondistended. Bowel sounds present. No organomegaly or  mass.  EXTREMITIES: No cyanosis, clubbing or edema b/l.    NEUROLOGIC: Cranial nerves II through XII are intact. No focal Motor or sensory deficits b/l.   PSYCHIATRIC: The patient is alert and oriented x 3.  SKIN: No obvious rash, lesion, or ulcer.   LABORATORY PANEL:   CBC Recent Labs  Lab 05/31/17 1626  WBC 6.3  HGB 15.1  HCT 44.3  PLT 119*   ------------------------------------------------------------------------------------------------------------------ Chemistries  Recent Labs  Lab 05/31/17 1156 05/31/17 1626  NA 139  --   K 4.2  --   CL 105  --   CO2 26  --   GLUCOSE 110*  --   BUN 16  --   CREATININE 0.86 0.87  CALCIUM 9.4  --    ------------------------------------------------------------------------------------------------------------------  Cardiac Enzymes Recent Labs  Lab 06/01/17 0413  TROPONINI 0.06*   ------------------------------------------------------------------------------------------------------------------  RADIOLOGY:  Dg Chest 2 View  Result Date: 05/31/2017 CLINICAL DATA:  Nonsmoker.  Syncope. EXAM: CHEST  2 VIEW COMPARISON:  Aug 07, 2015 FINDINGS: The heart size and mediastinal contours are within normal limits. Both lungs are clear. The visualized skeletal structures are unremarkable. IMPRESSION: No active cardiopulmonary disease. Electronically Signed   By: Dorise Bullion III M.D   On: 05/31/2017 13:57   Ct Head Wo Contrast  Result Date: 05/31/2017 CLINICAL DATA:  Syncope.  Hematoma right forehead. EXAM: CT HEAD WITHOUT CONTRAST TECHNIQUE: Contiguous axial images were obtained from the base of the skull through the vertex without intravenous contrast. COMPARISON:  None. FINDINGS: Brain: No subdural, epidural, or subarachnoid hemorrhage. Cerebellum, brainstem, and basal cisterns are normal. Ventricles and sulci are unremarkable. No acute cortical ischemia or infarct. No other acute abnormalities. No mass effect or midline shift. Vascular:  No  hyperdense vessel or unexpected calcification. Skull: Normal. Negative for fracture or focal lesion. Sinuses/Orbits: No acute finding. Other: Soft tissue swelling over the right side of the forehead. Extracranial soft tissues are otherwise normal. IMPRESSION: No acute intracranial abnormality. Soft tissue swelling over the right forehead. Electronically Signed   By: Dorise Bullion III M.D   On: 05/31/2017 13:56   US Carotid Bilateral  Result Date: 06/01/2017 CLINICAL DATA:  62 year old male with a history of syncope and collapse. Cardiovascular risk factors include hypertension, hyperlipidemia EXAM: BILATERAL CAROTID DUPLEX ULTRASOUND TECHNIQUE: Pearline Cables scale imaging, color Doppler and duplex ultrasound were performed of bilateral carotid and vertebral arteries in the neck. COMPARISON:  No prior duplex FINDINGS: Criteria: Quantification of carotid stenosis is based on velocity parameters that correlate the residual internal carotid diameter with NASCET-based stenosis levels, using the diameter of the distal internal carotid lumen as the denominator for stenosis measurement. The following velocity measurements were obtained: RIGHT ICA:  Systolic 69 cm/sec, Diastolic 28 cm/sec CCA:  57 cm/sec SYSTOLIC ICA/CCA RATIO:  1.2 ECA:  53 cm/sec LEFT ICA:  Systolic 8 65 cm/sec, Diastolic 27 cm/sec CCA:  68 cm/sec SYSTOLIC ICA/CCA RATIO:  0.9 ECA:  54 cm/sec Right Brachial SBP: Not acquired Left Brachial SBP: Not acquired RIGHT CAROTID ARTERY: No significant calcified disease of the right common carotid artery. Intermediate waveform maintained. Homogeneous plaque without significant calcifications at the right carotid bifurcation. Low resistance waveform of the right ICA. No significant tortuosity. RIGHT VERTEBRAL ARTERY: Antegrade flow with low resistance waveform. LEFT CAROTID ARTERY: No significant calcified disease of the left common carotid artery. Intermediate waveform maintained. Homogeneous plaque at the left carotid  bifurcation without significant calcifications. Low resistance waveform of the left ICA. LEFT VERTEBRAL ARTERY:  Antegrade flow with low resistance waveform. IMPRESSION: Color duplex indicates minimal homogeneous plaque, with no hemodynamically significant stenosis by duplex criteria in the extracranial cerebrovascular circulation. Signed, Dulcy Fanny. Earleen Newport, DO [facility] Radiology Electronically Signed   By: Corrie Mckusick D.O.   On: 06/01/2017 07:46     ASSESSMENT AND PLAN:   * Severe AS with syncope Needs AVR. Cardiac cath in AM Mild elevation in troponin. Not MI  * Mild thrombocytopenia is chronic No bleeding  * HTN Well controlled   All the records are reviewed and case discussed with Care Management/Social Workerr. Management plans discussed with the patient, family and they are in agreement.  CODE STATUS: FULL CODE  DVT Prophylaxis: SCDs  TOTAL TIME TAKING CARE OF THIS PATIENT: 30 minutes.   POSSIBLE D/C IN 1-2 DAYS, DEPENDING ON CLINICAL CONDITION.  Leia Alf Marimar Suber M.D on 06/02/2017 at 6:58 AM  Between 7am to 6pm - Pager - [phone]  After 6pm go to [email or web address] - password [email or web address] [facility]  Office  [phone]  CC: Primary care physician; Olin Hauser, DO  Note: This dictation was prepared with Dragon dictation along with smaller phrase technology. Any transcriptional errors that result from this process are unintentional.

## 2017-06-03 ENCOUNTER — Other Ambulatory Visit: Payer: Self-pay | Admitting: Cardiology

## 2017-06-04 NOTE — Discharge Summary (Signed)
[place] [facility] [place] NAME: Todd Frank    MR#:  [id number]  DATE OF BIRTH:  March 28, 1956  DATE OF ADMISSION:  05/31/2017 ADMITTING PHYSICIAN: Gorden Harms, MD  DATE OF DISCHARGE: 06/02/2017  2:54 PM  PRIMARY CARE PHYSICIAN: Olin Hauser, DO   ADMISSION DIAGNOSIS:  Syncope and collapse [R55] Syncope, unspecified syncope type [R55]  DISCHARGE DIAGNOSIS:  Active Problems:   Severe aortic stenosis   Syncope   SECONDARY DIAGNOSIS:   Past Medical History:  Diagnosis Date  . Aortic stenosis    a. mod-severe by echo 02/2016 but moderate by cath 02/2016  . Bicuspid aortic valve   . Bradycardia 07/23/2015  . CAD (coronary artery disease), native coronary artery    cath 02/2016 showing  Normal LVF, moderate AS, occluded distal RCA supplied by LCx collaterals, 40% prox RCA, 80% acute marginal, 60% OM3, 90% mid to distal LAD (does not reach the apex and small), 60% D1, 50% OM1, 60% mid LAD, 70% PDA - medical management was felt to be best option.    . Carotid bruit    carotid dopplers negative.  Bruit due to heart murmur from AS  . Congenital nevus   . Diastolic dysfunction   . Dilated aortic root ([place])    55mm by echo 02/2016  . GERD (gastroesophageal reflux disease)   . HTN (hypertension)   . Hyperlipidemia    LDL goal < 70  . LVH (left ventricular hypertrophy)   . Melanoma of skin ([place])   . Obesity   . PFO (patent foramen ovale)    small by echo 2014     ADMITTING HISTORY  HISTORY OF PRESENT ILLNESS: Todd Frank  is a 62 y.o. male with a known history of severe aortic stenosis, was out jogging earlier today, developed acute loss of consciousness/syncope with collapse, results resulting in fall hitting his head, in the emergency room workup noted for right forehead cephalhematoma on CT of the head, elevated troponin of 0.06, the rest of his workup was on impressive, patient in no apparent distress, resting comfortably in  bed, patient stated this is his first ever episode of syncope/near syncope within the last year, has had conversations with his cardiologist in the past about needing aortic valve replacement in the near future, patient now being admitted for acute syncope secondary to severe aortic stenosis.  HOSPITAL COURSE:   * Severe AS with syncope Needs AVR. Referred to ct surgery by cardiology Cardiac cath with no PCI needed Mild elevation in troponin. Not MI  * Mild thrombocytopenia is chronic No bleeding  * HTN Well controlled   Stable for discharge home.  No further syncopal episodes.  Amlodipine stopped at discharge. On aspirin and Lipitor.  CONSULTS OBTAINED:  Treatment Team:  Evans Lance, MD  DRUG ALLERGIES:  No Known Allergies  DISCHARGE MEDICATIONS:   Allergies as of 06/02/2017   No Known Allergies     Medication List    STOP taking these medications   amLODipine 2.5 MG tablet Commonly known as:  NORVASC     TAKE these medications   aspirin 81 MG tablet Take 81 mg by mouth daily.   atorvastatin 80 MG tablet Commonly known as:  LIPITOR Take 1 tablet (80 mg total) by mouth daily.   CO Q 10 PO Take 1 tablet by mouth daily.   fish oil-omega-3 fatty acids 1000 MG capsule Take 2,400 mg by mouth daily.   fluticasone 50 MCG/ACT  nasal spray Commonly known as:  FLONASE Place 2 sprays into both nostrils daily.       Today   VITAL SIGNS:  Blood pressure 122/72, pulse 71, temperature 98 F (36.7 C), temperature source Oral, resp. rate 16, height 5\' 9"  (1.753 m), weight 85.3 kg (188 lb), SpO2 97 %.  I/O:  No intake or output data in the 24 hours ending 06/04/17 1510  PHYSICAL EXAMINATION:  Physical Exam  GENERAL:  62 y.o.-year-old patient lying in the bed with no acute distress.  LUNGS: Normal breath sounds bilaterally, no wheezing, rales,rhonchi or crepitation. No use of accessory muscles of respiration.  CARDIOVASCULAR: S1, S2 normal. No murmurs, rubs,  or gallops.  ABDOMEN: Soft, non-tender, non-distended. Bowel sounds present. No organomegaly or mass.  NEUROLOGIC: Moves all 4 extremities. PSYCHIATRIC: The patient is alert and oriented x 3.  SKIN: No obvious rash, lesion, or ulcer.   DATA REVIEW:   CBC Recent Labs  Lab 05/31/17 1626  WBC 6.3  HGB 15.1  HCT 44.3  PLT 119*    Chemistries  Recent Labs  Lab 05/31/17 1156 05/31/17 1626  NA 139  --   K 4.2  --   CL 105  --   CO2 26  --   GLUCOSE 110*  --   BUN 16  --   CREATININE 0.86 0.87  CALCIUM 9.4  --     Cardiac Enzymes Recent Labs  Lab 06/01/17 0413  TROPONINI 0.06*    Microbiology Results  No results found for this or any previous visit.  RADIOLOGY:  No results found.  Follow up with PCP in 1 week.  Management plans discussed with the patient, family and they are in agreement.  CODE STATUS:  Code Status History    Date Active Date Inactive Code Status Order ID Comments User Context   05/31/2017 16:18 06/02/2017 20:50 Full Code [id number]  Gorden Harms, MD Inpatient   02/20/2016 09:49 02/20/2016 16:23 Full Code [id number]  Belva Crome, MD Inpatient    Advance Directive Documentation     Most Recent Value  Type of Advance Directive  Living will, Healthcare Power of Attorney  Pre-existing out of facility DNR order (yellow form or pink MOST form)  No data  "MOST" Form in Place?  No data      TOTAL TIME TAKING CARE OF THIS PATIENT ON DAY OF DISCHARGE: more than 30 minutes.   Leia Alf Teonna Coonan M.D on 06/04/2017 at 3:10 PM  Between 7am to 6pm - Pager - [phone]  After 6pm go to [email or web address] - password [email or web address] [facility]  Office  [phone]  CC: Primary care physician; Olin Hauser, DO  Note: This dictation was prepared with Dragon dictation along with smaller phrase technology. Any transcriptional errors that result from this process are unintentional.

## 2017-06-05 ENCOUNTER — Telehealth: Payer: Self-pay

## 2017-06-05 NOTE — Telephone Encounter (Signed)
Transition Care Management Follow-up Telephone Call   Date discharged?06/02/2017   How have you been since you were released from the hospital? "fine"   Do you understand why you were in the hospital? yes   Do you understand the discharge instructions? yes   Where were you discharged to? home   Items Reviewed:  Medications reviewed: yes  Allergies reviewed: yes  Dietary changes reviewed: yes  Referrals reviewed: yes   Functional Questionnaire:   Activities of Daily Living (ADLs):   He states they are independent in the following: ambulation, bathing and hygiene, feeding, continence, grooming, toileting and dressing States they require assistance with the following: none   Any transportation issues/concerns?: no   Any patient concerns? no   Confirmed importance and date/time of follow-up visits scheduled yes  Patient declined follow up with PCP  Patient has Follow up with peter Prescott Gum on 06/11/2017.  Confirmed with patient if condition begins to worsen call PCP or go to the ER.  Patient was given the office number and encouraged to call back with question or concerns.  : yes

## 2017-06-11 ENCOUNTER — Institutional Professional Consult (permissible substitution): Payer: Managed Care, Other (non HMO) | Admitting: Cardiothoracic Surgery

## 2017-06-11 ENCOUNTER — Other Ambulatory Visit: Payer: Self-pay

## 2017-06-11 VITALS — BP 151/91 | HR 74 | Resp 18 | Ht 69.0 in | Wt 198.2 lb

## 2017-06-11 DIAGNOSIS — I350 Nonrheumatic aortic (valve) stenosis: Secondary | ICD-10-CM | POA: Diagnosis not present

## 2017-06-11 NOTE — Progress Notes (Signed)
PCP is Parks Ranger, Devonne Doughty, DO Referring Provider is [name], MD  Chief Complaint  Patient presents with  . Aortic Stenosis    New patient evaluation, AVR Echo 2/25, Cath 2/24    HPI: Patient examined, cardiac catheterization and echocardiogram recently performed personally reviewed and discussed with patient and wife.  62 year old nondiabetic non-smoker employed male presents with diagnosis of severe bicuspid aortic stenosis, moderately dilated ascending aorta, with 2 vessel coronary artery disease, and recent syncopal episode while he was jogging and had documented heart rate up to 170.  He sustained a fall to the forehead and was hospitalized and observed at [facility].  Head scans were unremarkable.  He recovered quickly and underwent left and right heart cath and a repeat echocardiogram.  The echocardiogram showed an increased gradient of his bicuspid aortic valve since his last study with a peak gradient of 75 mmHg and mean gradient of 44 mmHg.  Calculated area 0.7.  LV function was with normal EF, LVH was present no other valvular abnormalities.  Patient had a cath which showed stable moderate disease with a 80-90% stenosis of his LAD and 80% stenosis of a distal posterior descending lesion.  The patient had undergone catheterization in 2017 after an abnormal stress test was performed prior to back surgery.  His coronary disease has been treated medically and is been asymptomatic.  Patient recently underwent dental exam and care including root canal of a left molar with a crown being placed by his dentist.  He was treated with antibiotics during those procedures.  Since being discharged from the hospital after his syncopal event and head trauma he has been told not to drive.  He has returned to work.  He is not engaged in any sort of exercise training.  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  Patient has positive family history of aortic stenosis in his 76 year old mother.  Patient had a CT scan over 2 years ago which demonstrated a moderate ascending aortic dilatation 4.2 cm.  Patient denies any  Childhood illnesses suggestive of rheumatic fever or scarlet fever and has been very physically active his entire life.  We talked about choice of valve prosthesis and he demonstrated preference for a biologic valve which would not commit him to lifelong Coumadin therapy.  Patient was also noted to have a patent foramen ovale on his echocardiogram study.  He is in sinus rhythm.  Past Medical History:  Diagnosis Date  . Aortic stenosis    a. mod-severe by echo 02/2016 but moderate by cath 02/2016  . Bicuspid aortic valve   . Bradycardia 07/23/2015  . CAD (coronary artery disease), native coronary artery    cath 02/2016 showing  Normal LVF, moderate AS, occluded distal RCA supplied by LCx collaterals, 40% prox RCA, 80% acute marginal, 60% OM3, 90% mid to distal LAD (does not reach the apex and small), 60% D1, 50% OM1, 60% mid LAD, 70% PDA - medical management was felt to be best option.    . Carotid bruit    carotid dopplers negative.  Bruit due to heart murmur from AS  . Congenital nevus   . Diastolic dysfunction   . Dilated aortic root ([place])    85mm by echo 02/2016  . GERD (gastroesophageal reflux disease)   . HTN (hypertension)   . Hyperlipidemia    LDL goal <  75  . LVH (left ventricular hypertrophy)   . Melanoma of skin ([place])   . Obesity   . PFO (patent foramen ovale)    small by echo 2014    Past Surgical History:  Procedure Laterality Date  . CARDIAC CATHETERIZATION N/A 02/20/2016   Procedure: Right/Left Heart Cath and Coronary Angiography;  Surgeon: Belva Crome, MD;  Location: [facility];  Service: Cardiovascular;  Laterality: N/A;  . CORONARY/GRAFT ANGIOGRAPHY N/A 06/02/2017   Procedure: CORONARY/GRAFT  ANGIOGRAPHY;  Surgeon: Nelva Bush, MD;  Location: [facility];  Service: Cardiovascular;  Laterality: N/A;  . EYE SURGERY    . MELANOMA EXCISION  2011   left thigh  . RIGHT HEART CATH N/A 06/02/2017   Procedure: RIGHT HEART CATH;  Surgeon: Nelva Bush, MD;  Location: [facility];  Service: Cardiovascular;  Laterality: N/A;  . TONSILLECTOMY      Family History  Problem Relation Age of Onset  . Diabetes Mother   . Arthritis Father   . Heart disease Paternal Grandmother        MI  . Heart attack Unknown        family history  . Pancreatic cancer Maternal Aunt     Social History Social History   Tobacco Use  . Smoking status: Former Research scientist (life sciences)  . Smokeless tobacco: Never Used  . Tobacco comment: quit in 78, teenager  Substance Use Topics  . Alcohol use: Yes    Alcohol/week: 0.6 oz    Types: 1 Cans of beer per week  . Drug use: No    Current Outpatient Medications  Medication Sig Dispense Refill  . aspirin 81 MG tablet Take 81 mg by mouth daily.    Marland Kitchen atorvastatin (LIPITOR) 80 MG tablet Take 1 tablet (80 mg total) by mouth daily. 90 tablet 3  . Coenzyme Q10 (CO Q 10 PO) Take 1 tablet by mouth daily.     . fish oil-omega-3 fatty acids 1000 MG capsule Take 2,400 mg by mouth daily.     . fluticasone (FLONASE) 50 MCG/ACT nasal spray Place 2 sprays into both nostrils daily. 16 g 12   No current facility-administered medications for this visit.     No Known Allergies        Review of Systems :  [ y ] = yes, [  ] = no        General :  Weight gain [   ]    Weight loss  [   ]  Fatigue [  ]  Fever [  ]  Chills  [  ]                                Weakness  [  ]           HEENT    Headache [  ]  Dizziness [  ]  Blurred vision [  ] Glaucoma  [  ]                          Nosebleeds [  ] Painful or loose teeth [recent left molar root canal and crown]        Cardiac :  Chest pain/ pressure [  ]  Resting SOB [  ] exertional SOB [  ]  Orthopnea [  ]  Pedal edema  [  ]  Palpitations [  ] Syncope/presyncope [yes]                        Paroxysmal nocturnal dyspnea [  ]         Pulmonary : cough [  ]  wheezing [  ]  Hemoptysis [  ] Sputum [  ] Snoring [  ]                              Pneumothorax [  ]  Sleep apnea [  ]        GI : Vomiting [  ]  Dysphagia [  ]  Melena  [  ]  Abdominal pain [  ] BRBPR [  ]              Heart burn [  ]  Constipation [  ] Diarrhea  [  ] Colonoscopy [   ]        GU : Hematuria [  ]  Dysuria [  ]  Nocturia [  ] UTI's [  ]        Vascular : Claudication [  ]  Rest pain [  ]  DVT [  ] Vein stripping [  ] leg ulcers [  ]                          TIA [  ] Stroke [  ]  Varicose veins [  ]        NEURO :  Headaches  [  ] Seizures [  ] Vision changes [prior extraocular muscle surgery in childhood] Paresthesias [  ]   right-hand-dominant                                    seizures [  ]        Musculoskeletal :  Arthritis [  ] Gout  [  ]  Back pain [  ]  Joint pain [  ]        Skin :  Rash [  ]  Melanoma [yes resected from left thigh with wide local excision] Sores [  ]        Heme : Bleeding problems [  ]Clotting Disorders [  ] Anemia [  ]Blood Transfusion [ ]         Endocrine : Diabetes [  ] Heat or Cold intolerance [  ] Polyuria [  ]excessive thirst [ ]         Psych : Depression [  ]  Anxiety [  ]  Psych hospitalizations [  ] Memory change [  ]                                               BP (!) 151/91 (BP Location: Right Arm, Patient Position: Sitting, Cuff Size: Large)   Pulse 74   Resp 18   Ht 5\' 9"  (1.753 m)   Wt 198 lb 3.2 oz (89.9 kg)   SpO2 99% Comment: RA  BMI 29.27 kg/m  Physical Exam     Physical  Exam  General: Well-nourished middle-aged Caucasian male accompanied by wife in no acute distress HEENT: Normocephalic pupils equal , dentition adequate Neck: Supple without JVD, adenopathy, or bruit Chest: Clear to auscultation, symmetrical breath sounds, no rhonchi, no  tenderness             or deformity Cardiovascular: Regular rate and rhythm, + murmur--3/6 AS  SEM , no gallop, peripheral pulses             palpable in all extremities Abdomen:  Soft, nontender, no palpable mass or organomegaly Extremities: Warm, well-perfused, no clubbing cyanosis edema or tenderness,              no venous stasis changes of the legs Rectal/GU: Deferred Neuro: Grossly non--focal and symmetrical throughout Skin: Clean and dry without rash or ulceration   Diagnostic Tests: Cardiac catheterization with 2 vessel CAD, normal right heart pressures and cardiac output Echocardiogram with LVH and severe bicuspid aortic stenosis  Impression: The patient would benefit from aortic valve replacement with a biologic valve and combined bypass graft to the LAD and distal right coronary circulation.  This will be scheduled in approximately 10 days at [facility] March 21.  I discussed the details of the procedure, the benefits, the expected recovery, and the risk in detail with the patient and wife.  They understand and agree to proceed with surgery.  Plan: Patient will undergo CT a of chest prior to surgery to assess ascending aorta He will stay on his current medications with exception of fish oil tablets which he will stop. Len Childs, MD [facility] [phone]

## 2017-06-12 ENCOUNTER — Other Ambulatory Visit: Payer: Self-pay | Admitting: *Deleted

## 2017-06-12 DIAGNOSIS — I350 Nonrheumatic aortic (valve) stenosis: Secondary | ICD-10-CM

## 2017-06-12 DIAGNOSIS — I2510 Atherosclerotic heart disease of native coronary artery without angina pectoris: Secondary | ICD-10-CM

## 2017-06-14 ENCOUNTER — Other Ambulatory Visit: Payer: Self-pay | Admitting: Cardiology

## 2017-06-15 ENCOUNTER — Other Ambulatory Visit: Payer: Self-pay | Admitting: *Deleted

## 2017-06-15 DIAGNOSIS — I350 Nonrheumatic aortic (valve) stenosis: Secondary | ICD-10-CM

## 2017-06-15 DIAGNOSIS — Z01818 Encounter for other preprocedural examination: Secondary | ICD-10-CM

## 2017-06-19 ENCOUNTER — Ambulatory Visit
Admission: RE | Admit: 2017-06-19 | Discharge: 2017-06-19 | Disposition: A | Discharge status: Home / Self Care | Payer: Managed Care, Other (non HMO) | Source: Ambulatory Visit | Attending: Cardiothoracic Surgery | Admitting: Cardiothoracic Surgery

## 2017-06-19 DIAGNOSIS — Z01818 Encounter for other preprocedural examination: Secondary | ICD-10-CM

## 2017-06-19 DIAGNOSIS — I350 Nonrheumatic aortic (valve) stenosis: Secondary | ICD-10-CM

## 2017-06-19 MED ORDER — IOPAMIDOL (ISOVUE-370) INJECTION 76%
75.0000 mL | Freq: Once | INTRAVENOUS | Status: AC | PRN
  Administered 2017-06-19: 75 mL via INTRAVENOUS

## 2017-06-22 ENCOUNTER — Encounter (HOSPITAL_COMMUNITY): Payer: Self-pay

## 2017-06-22 NOTE — Pre-Procedure Instructions (Signed)
Todd Frank  06/22/2017      [facility], [address] AT [address] [place] [address] Phone: [phone] Fax: [phone]  [place], [place] [facility] [address] [place] [address] [place] [address] Phone: [phone] Fax: [phone]    Your procedure is scheduled on Thurs., June 25, 2017  Report to [facility] Admitting Entrance "A" at 5:30AM   Call this number if you have problems the morning of surgery:  [phone]   Remember:  Do not eat food or drink liquids after midnight.  Take these medicines the morning of surgery with A SIP OF WATER: If needed Fluticasone (FLONASE) for nasal congestion.  Follow your doctor's instruction regarding Aspirin.  As of today, stop taking all Aspirins, Vitamins, Fish oils, and Herbal medications. Also stop all NSAIDS i.e. Advil, Ibuprofen, Motrin, Aleve, Anaprox, Naproxen, BC and Goody Powders.   Do not wear jewelry.  Do not wear lotions, powders, colognes, or deodorant.  Do not shave 48 hours prior to surgery.  Men may shave face.  Do not bring valuables to the hospital.  [facility] is not responsible for any belongings or valuables.  Contacts, dentures or bridgework may not be worn into surgery.  Leave your suitcase in the car.  After surgery it may be brought to your room.  For patients admitted to the hospital, discharge time will be determined by your treatment team.  Patients discharged the day of surgery will not be allowed to drive home.   Special instructions:   [place]- Preparing For Surgery  Before surgery, you can play an important role. Because skin is not sterile, your skin needs to be as free of germs as possible. You can reduce the number of germs on your skin by washing with CHG (chlorahexidine gluconate) Soap before surgery.  CHG is an antiseptic cleaner which kills germs and bonds  with the skin to continue killing germs even after washing.  Please do not use if you have an allergy to CHG or antibacterial soaps. If your skin becomes reddened/irritated stop using the CHG.  Do not shave (including legs and underarms) for at least 48 hours prior to first CHG shower. It is OK to shave your face.  Please follow these instructions carefully.   1. Shower the NIGHT BEFORE SURGERY and the MORNING OF SURGERY with CHG.   2. If you chose to wash your hair, wash your hair first as usual with your normal shampoo.  3. After you shampoo, rinse your hair and body thoroughly to remove the shampoo.  4. Use CHG as you would any other liquid soap. You can apply CHG directly to the skin and wash gently with a scrungie or a clean washcloth.   5. Apply the CHG Soap to your body ONLY FROM THE NECK DOWN.  Do not use on open wounds or open sores. Avoid contact with your eyes, ears, mouth and genitals (private parts). Wash Face and genitals (private parts)  with your normal soap.  6. Wash thoroughly, paying special attention to the area where your surgery will be performed.  7. Thoroughly rinse your body with warm water from the neck down.  8. DO NOT shower/wash with your normal soap after using and rinsing off the CHG Soap.  9. Pat yourself dry with a CLEAN TOWEL.  10. Wear CLEAN  PAJAMAS to bed the night before surgery, wear comfortable clothes the morning of surgery  11. Place CLEAN SHEETS on your bed the night of your first shower and DO NOT SLEEP WITH PETS.  Day of Surgery: Do not apply any deodorants/lotions. Please wear clean clothes to the hospital/surgery center.    Please read over the following fact sheets that you were given. Pain Booklet, Coughing and Deep Breathing, Blood Transfusion Information, MRSA Information and Surgical Site Infection Prevention

## 2017-06-23 ENCOUNTER — Encounter (HOSPITAL_COMMUNITY)
Admission: RE | Admit: 2017-06-23 | Discharge: 2017-06-23 | Disposition: A | Discharge status: Home / Self Care | Payer: Managed Care, Other (non HMO) | Source: Ambulatory Visit | Attending: Cardiothoracic Surgery | Admitting: Cardiothoracic Surgery

## 2017-06-23 ENCOUNTER — Ambulatory Visit (HOSPITAL_COMMUNITY)
Admission: RE | Admit: 2017-06-23 | Discharge: 2017-06-23 | Disposition: A | Discharge status: Home / Self Care | Payer: Managed Care, Other (non HMO) | Source: Ambulatory Visit | Attending: Cardiothoracic Surgery | Admitting: Cardiothoracic Surgery

## 2017-06-23 ENCOUNTER — Other Ambulatory Visit (HOSPITAL_COMMUNITY): Payer: Managed Care, Other (non HMO)

## 2017-06-23 ENCOUNTER — Other Ambulatory Visit: Payer: Self-pay

## 2017-06-23 ENCOUNTER — Encounter (HOSPITAL_COMMUNITY): Payer: Self-pay

## 2017-06-23 ENCOUNTER — Ambulatory Visit (HOSPITAL_BASED_OUTPATIENT_CLINIC_OR_DEPARTMENT_OTHER)
Admission: RE | Admit: 2017-06-23 | Discharge: 2017-06-23 | Disposition: A | Discharge status: Home / Self Care | Payer: Managed Care, Other (non HMO) | Source: Ambulatory Visit | Attending: Cardiothoracic Surgery | Admitting: Cardiothoracic Surgery

## 2017-06-23 DIAGNOSIS — I2510 Atherosclerotic heart disease of native coronary artery without angina pectoris: Secondary | ICD-10-CM

## 2017-06-23 DIAGNOSIS — I350 Nonrheumatic aortic (valve) stenosis: Secondary | ICD-10-CM

## 2017-06-23 DIAGNOSIS — Z01810 Encounter for preprocedural cardiovascular examination: Secondary | ICD-10-CM | POA: Insufficient documentation

## 2017-06-23 DIAGNOSIS — R9431 Abnormal electrocardiogram [ECG] [EKG]: Secondary | ICD-10-CM | POA: Insufficient documentation

## 2017-06-23 DIAGNOSIS — Z01812 Encounter for preprocedural laboratory examination: Secondary | ICD-10-CM

## 2017-06-23 HISTORY — DX: Fracture of unspecified part of unspecified clavicle, initial encounter for closed fracture: S42.009A

## 2017-06-23 HISTORY — DX: Unspecified osteoarthritis, unspecified site: M19.90

## 2017-06-23 HISTORY — DX: Headache: R51

## 2017-06-23 HISTORY — DX: Headache, unspecified: R51.9

## 2017-06-23 LAB — COMPREHENSIVE METABOLIC PANEL
ALT: 27 U/L (ref 17–63)
AST: 27 U/L (ref 15–41)
Albumin: 4.3 g/dL (ref 3.5–5.0)
Alkaline Phosphatase: 79 U/L (ref 38–126)
Anion gap: 11 (ref 5–15)
BUN: 12 mg/dL (ref 6–20)
CO2: 19 mmol/L — ABNORMAL LOW (ref 22–32)
Calcium: 9.2 mg/dL (ref 8.9–10.3)
Chloride: 108 mmol/L (ref 101–111)
Creatinine, Ser: 0.71 mg/dL (ref 0.61–1.24)
GFR calc Af Amer: >60 mL/min (ref >60)
GFR calc non Af Amer: >60 mL/min (ref >60)
Glucose, Bld: 90 mg/dL (ref 65–99)
Potassium: 4.1 mmol/L (ref 3.5–5.1)
Sodium: 138 mmol/L (ref 135–145)
Total Bilirubin: 1 mg/dL (ref 0.3–1.2)
Total Protein: 6.2 g/dL — ABNORMAL LOW (ref 6.5–8.1)

## 2017-06-23 LAB — PULMONARY FUNCTION TEST
DL/VA % pred: 97 %
DL/VA: 4.45 ml/min/mmHg/L
DLCO unc % pred: 94 %
DLCO unc: 29.99 ml/min/mmHg
FEF 25-75 Post: 5.11 L/sec
FEF 25-75 Pre: 4.83 L/sec
FEF2575-%Change-Post: 5 %
FEF2575-%Pred-Post: 182 %
FEF2575-%Pred-Pre: 172 %
FEV1-%Change-Post: 1 %
FEV1-%Pred-Post: 118 %
FEV1-%Pred-Pre: 117 %
FEV1-Post: 4.11 L
FEV1-Pre: 4.06 L
FEV1FVC-%Change-Post: 1 %
FEV1FVC-%Pred-Pre: 115 %
FEV6-%Change-Post: 0 %
FEV6-%Pred-Post: 107 %
FEV6-%Pred-Pre: 107 %
FEV6-Post: 4.7 L
FEV6-Pre: 4.7 L
FEV6FVC-%Pred-Post: 105 %
FEV6FVC-%Pred-Pre: 105 %
FVC-%Change-Post: 0 %
FVC-%Pred-Post: 101 %
FVC-%Pred-Pre: 101 %
FVC-Post: 4.7 L
FVC-Pre: 4.7 L
Post FEV1/FVC ratio: 88 %
Post FEV6/FVC ratio: 100 %
Pre FEV1/FVC ratio: 86 %
Pre FEV6/FVC Ratio: 100 %
RV % pred: 92 %
RV: 2.09 L
TLC % pred: 102 %
TLC: 7.04 L

## 2017-06-23 LAB — URINALYSIS, ROUTINE W REFLEX MICROSCOPIC
Bilirubin Urine: NEGATIVE
Glucose, UA: NEGATIVE mg/dL
Hgb urine dipstick: NEGATIVE
Ketones, ur: NEGATIVE mg/dL
Leukocytes, UA: NEGATIVE
Nitrite: NEGATIVE
Protein, ur: NEGATIVE mg/dL
Specific Gravity, Urine: 1.008 (ref 1.005–1.030)
pH: 6 (ref 5.0–8.0)

## 2017-06-23 LAB — BLOOD GAS, ARTERIAL
Acid-Base Excess: 0.6 mmol/L (ref 0.0–2.0)
Bicarbonate: 24.6 mmol/L (ref 20.0–28.0)
Drawn by: 10451
FIO2: 21
O2 Saturation: 98.5 %
Patient temperature: 98.6
pCO2 arterial: 39.5 mmHg (ref 32.0–48.0)
pH, Arterial: 7.412 (ref 7.350–7.450)
pO2, Arterial: 112 mmHg — ABNORMAL HIGH (ref 83.0–108.0)

## 2017-06-23 LAB — CBC
HCT: 46.6 % (ref 39.0–52.0)
Hemoglobin: 16.2 g/dL (ref 13.0–17.0)
MCH: 30.9 pg (ref 26.0–34.0)
MCHC: 34.8 g/dL (ref 30.0–36.0)
MCV: 88.9 fL (ref 78.0–100.0)
Platelets: 119 10*3/uL — ABNORMAL LOW (ref 150–400)
RBC: 5.24 MIL/uL (ref 4.22–5.81)
RDW: 13.2 % (ref 11.5–15.5)
WBC: 5.2 10*3/uL (ref 4.0–10.5)

## 2017-06-23 LAB — PROTIME-INR
INR: 1.11
Prothrombin Time: 14.2 seconds (ref 11.4–15.2)

## 2017-06-23 LAB — HEMOGLOBIN A1C
Hgb A1c MFr Bld: 4.9 % (ref 4.8–5.6)
Mean Plasma Glucose: 93.93 mg/dL

## 2017-06-23 LAB — ABO/RH: ABO/RH(D): O POS

## 2017-06-23 LAB — SURGICAL PCR SCREEN
MRSA, PCR: NEGATIVE
Staphylococcus aureus: POSITIVE — AB

## 2017-06-23 LAB — APTT: aPTT: 31 seconds (ref 24–36)

## 2017-06-23 MED ORDER — ALBUTEROL SULFATE (2.5 MG/3ML) 0.083% IN NEBU
2.5000 mg | INHALATION_SOLUTION | Freq: Once | RESPIRATORY_TRACT | Status: AC
  Administered 2017-06-23: 2.5 mg via RESPIRATORY_TRACT

## 2017-06-23 NOTE — Progress Notes (Signed)
Carotid duplex 05/31/17 WNL. UE Doppler prelim: Right palmar radial obliterates with compression, ulnar within normal limits. Left palmar radial decreases >50% with compression, ulnar within normal limits. Landry Mellow, RDMS, RVT

## 2017-06-23 NOTE — Progress Notes (Signed)
PCP: Dr. Parks Ranger Cardiologist: Dr. [name]  EKG: Today CXR: Today ECHO: FEB 2019 Stress Test: 01/31/2016 Cardiac Cath: FEB 2019 Dopplers: Today PFTs: Today   Patient denies shortness of breath, fever, cough at PAT appointment.  Denies chest pain, but reports "chest pressure."   Patient verbalized understanding of instructions provided today at the PAT appointment.  Patient asked to review instructions at home and day of surgery.

## 2017-06-23 NOTE — Progress Notes (Addendum)
Patient positive for staph aureus nasal PCR swab. Prescription for Mupriocin called into [facility] in [place]. Pt called and notified to begin using ointment, verbalized understanding.  Called and spoke to [employer] to inform of pt's low platelets. Thurmond Butts stated she would inform Dr. Prescott Gum.

## 2017-06-24 MED ORDER — NITROGLYCERIN IN D5W 200-5 MCG/ML-% IV SOLN
2.0000 ug/min | INTRAVENOUS | Status: DC
  Filled 2017-06-24: qty 250

## 2017-06-24 MED ORDER — SODIUM CHLORIDE 0.9 % IV SOLN
INTRAVENOUS | Status: AC
  Administered 2017-06-25: 1.2 [IU]/h via INTRAVENOUS
  Filled 2017-06-24: qty 1

## 2017-06-24 MED ORDER — SODIUM CHLORIDE 0.9 % IV SOLN
INTRAVENOUS | Status: DC
  Filled 2017-06-24: qty 30

## 2017-06-24 MED ORDER — TRANEXAMIC ACID (OHS) PUMP PRIME SOLUTION
2.0000 mg/kg | INTRAVENOUS | Status: DC
  Filled 2017-06-24: qty 1.8

## 2017-06-24 MED ORDER — MILRINONE LACTATE IN DEXTROSE 20-5 MG/100ML-% IV SOLN
0.1250 ug/kg/min | INTRAVENOUS | Status: DC
  Administered 2017-06-25: 0.25 ug/kg/min via INTRAVENOUS
  Filled 2017-06-24: qty 100

## 2017-06-24 MED ORDER — POTASSIUM CHLORIDE 2 MEQ/ML IV SOLN
80.0000 meq | INTRAVENOUS | Status: DC
Start: 2017-06-25 — End: 2017-06-25
  Filled 2017-06-24: qty 40

## 2017-06-24 MED ORDER — TRANEXAMIC ACID 1000 MG/10ML IV SOLN
1.5000 mg/kg/h | INTRAVENOUS | Status: AC
  Administered 2017-06-25: 1.5 mg/kg/h via INTRAVENOUS
  Filled 2017-06-24: qty 25

## 2017-06-24 MED ORDER — DOPAMINE-DEXTROSE 3.2-5 MG/ML-% IV SOLN
0.0000 ug/kg/min | INTRAVENOUS | Status: DC
  Filled 2017-06-24: qty 250

## 2017-06-24 MED ORDER — EPINEPHRINE PF 1 MG/ML IJ SOLN
0.0000 ug/min | INTRAVENOUS | Status: DC
  Filled 2017-06-24: qty 4

## 2017-06-24 MED ORDER — SODIUM CHLORIDE 0.9 % IV SOLN
1.5000 g | INTRAVENOUS | Status: AC
  Administered 2017-06-25: 1.5 g via INTRAVENOUS
  Filled 2017-06-24: qty 1.5

## 2017-06-24 MED ORDER — DEXMEDETOMIDINE HCL IN NACL 400 MCG/100ML IV SOLN
0.1000 ug/kg/h | INTRAVENOUS | Status: AC
  Administered 2017-06-25: 0.7 ug/kg/h via INTRAVENOUS
  Filled 2017-06-24: qty 100

## 2017-06-24 MED ORDER — SODIUM CHLORIDE 0.9 % IV SOLN
750.0000 mg | INTRAVENOUS | Status: DC
  Filled 2017-06-24: qty 750

## 2017-06-24 MED ORDER — VANCOMYCIN HCL 10 G IV SOLR
1500.0000 mg | Freq: Once | INTRAVENOUS | Status: DC
  Filled 2017-06-24 (×2): qty 1500

## 2017-06-24 MED ORDER — PLASMA-LYTE 148 IV SOLN
INTRAVENOUS | Status: DC
  Filled 2017-06-24: qty 2.5

## 2017-06-24 MED ORDER — PLASMA-LYTE 148 IV SOLN
INTRAVENOUS | Status: AC
  Administered 2017-06-25: 500 mL
  Filled 2017-06-24: qty 2.5

## 2017-06-24 MED ORDER — SODIUM CHLORIDE 0.9 % IV SOLN
30.0000 ug/min | INTRAVENOUS | Status: AC
  Administered 2017-06-25: 25 ug/min via INTRAVENOUS
  Filled 2017-06-24: qty 20

## 2017-06-24 MED ORDER — MAGNESIUM SULFATE 50 % IJ SOLN
40.0000 meq | INTRAMUSCULAR | Status: DC
  Filled 2017-06-24: qty 9.85

## 2017-06-24 MED ORDER — TRANEXAMIC ACID (OHS) BOLUS VIA INFUSION
15.0000 mg/kg | INTRAVENOUS | Status: AC
  Administered 2017-06-25: 1353 mg via INTRAVENOUS
  Filled 2017-06-24: qty 1353

## 2017-06-24 NOTE — Progress Notes (Signed)
Anesthesia Chart Review: Patient is a 62 year old male scheduled for AVR, CABG on 06/25/17 by Dr. Tharon Aquas Trigt.  History includes former smoker, CAD, bicuspid AV with severe AS, dilated aortic root/ascending TAA 4.3 cm 06/29/17 CTA, PFO (reportedly small PFO by 2014 echo), bradycardia, diastolic dysfunction, LVH, HLD, HTN, carotid bruit (felt related to aortic murmur), GERD, congenital nevus, melanoma excision, headaches, arthritis, clavicular fracture/dislocation, tonsillectomy. - Admission [facility] 05/31/17-06/02/17 for syncope while jogging. He sustained a right forehead hematoma from falling due to syncope, but no acute intracranial abnormality by CT. He had known severe AS (02/2017 echo, but had been asymptomatic up to that point. Cardiology consulted. Echo and cath done. Out-patient CT surgery follow-up recommended for consideration of CABG/AVR.   PCP is Dr. Nobie Putnam. Cardiologist is Dr. [name].  Meds include ASA 81 mg, Lipitor, Flonase.  BP (!) 145/85   Pulse (!) 57   Temp 36.5 C   Resp 18   Ht 5' 9.5" (1.765 m)   Wt 198 lb 12.8 oz (90.2 kg)   SpO2 100%   BMI 28.94 kg/m    EKG 06/23/17: SB, non-specific T wave abnormality.  Cardiac cath 06/02/17:  1. Significant 2-vessel coronary artery disease involving the LAD and distal RCA/PDA, not significantly changed since prior catheterization in 02/2016. 2. Normal left heart, right heart, and pulmonary artery pressures. 3. Normal Fick cardiac output/index. 4. Heavily calcified aortic valve with limited mobility on fluoroscopic images. Recommendations: 1. Proceed with outpatient cardiac surgery consultation in the setting of severe aortic stenosis and multivessel coronary artery disease. 2. Continue aggressive secondary prevention.  Echo 06/01/17: Study Conclusions - Left ventricle: The cavity size was normal. There was moderate   concentric hypertrophy. Systolic function was normal. The   estimated ejection fraction was  in the range of 55% to 60%. Wall   motion was normal; there were no regional wall motion   abnormalities. Doppler parameters are consistent with abnormal   left ventricular relaxation (grade 1 diastolic dysfunction). - Aortic valve: Functionally bicuspid; moderately thickened,   severely calcified leaflets. There was severe stenosis. Mean   gradient (S): 46 mm Hg. Valve area (VTI): 0.73 cm^2. Valve area   (Vmean): 0.63 cm^2. - Left atrium: The atrium was mildly dilated. - Right atrium: The atrium was mildly dilated.  Event monitor 06/03/16:  Normal sinus rhythm with average heart rate 63bpm. Heart rate ranged from 38bpm to 135bpm.  Occasional PACs and PVCs  Carotid U/S 05/31/17: IMPRESSION: Color duplex indicates minimal homogeneous plaque, with no hemodynamically significant stenosis by duplex criteria in the extracranial cerebrovascular circulation.  CTA chest 06/19/17: IMPRESSION: Aneurysmal dilatation of the ascending aorta is not significantly changed measuring 4.3 cm today and previously measuring 4.2 cm. Advanced aortic valvular calcifications are noted. A recent echocardiogram demonstrates severe aortic stenosis. Peak gradient was recorded at 74 mm Hg. Recommend annual imaging followup by CTA or MRA. This recommendation follows 2010 ACCF/AHA/AATS/ACR/ASA/SCA/SCAI/SIR/STS/SVM Guidelines for the Diagnosis and Management of Patients with Thoracic Aortic Disease. Circulation. 2010; 121: G295-M841  CXR 06/23/17: IMPRESSION: There is no active cardiopulmonary abnormality.  PFTs 06/23/17: FVC 4.70 (101%), FEV1 4.06 (117%), DLCO unc 29.99 (94%).  Preoperative labs noted. C 0.71. H/H 16.2/46.6, PLT 119 (previously 119-133 since 02/10/17, 152 02/15/16). PT/PTT. A1c 4.9.   Of note, I called and spoke with patient because PAT RN notes indicate reported "chest pressure." He said that he meant that as a "history of chest pressure", not that he was having active chest symptoms. He  also  denied chest pain/pressure when I spoke with his today. If no acute changes then I would anticipate that he can proceed as planned.  George Hugh [facility] Short Stay Center/Anesthesiology Phone [phone] 06/24/2017 12:49 PM

## 2017-06-24 NOTE — Anesthesia Preprocedure Evaluation (Signed)
Anesthesia Evaluation  Patient identified by MRN, date of birth, ID band Patient awake    Reviewed: Allergy & Precautions, NPO status , Patient's Chart, lab work & pertinent test results  Airway Mallampati: II  TM Distance: >3 FB Neck ROM: Full    Dental no notable dental hx.    Pulmonary neg pulmonary ROS, former smoker,    Pulmonary exam normal breath sounds clear to auscultation       Cardiovascular hypertension, + angina with exertion + CAD and + Peripheral Vascular Disease  Normal cardiovascular exam Rhythm:Regular Rate:Normal     Neuro/Psych  Headaches,  Neuromuscular disease negative psych ROS   GI/Hepatic Neg liver ROS, GERD  ,  Endo/Other  negative endocrine ROS  Renal/GU negative Renal ROS     Musculoskeletal negative musculoskeletal ROS (+) Arthritis ,   Abdominal   Peds  Hematology negative hematology ROS (+)   Anesthesia Other Findings   Reproductive/Obstetrics negative OB ROS                             Anesthesia Physical Anesthesia Plan  ASA: IV  Anesthesia Plan: General   Post-op Pain Management:    Induction: Intravenous  PONV Risk Score and Plan: 3 and Treatment may vary due to age or medical condition  Airway Management Planned: Oral ETT  Additional Equipment: Arterial line, CVP, PA Cath, TEE, Ultrasound Guidance Line Placement and 3D TEE  Intra-op Plan:   Post-operative Plan: Post-operative intubation/ventilation  Informed Consent: I have reviewed the patients History and Physical, chart, labs and discussed the procedure including the risks, benefits and alternatives for the proposed anesthesia with the patient or authorized representative who has indicated his/her understanding and acceptance.   Dental advisory given  Plan Discussed with: CRNA  Anesthesia Plan Comments:         Anesthesia Quick Evaluation

## 2017-06-25 ENCOUNTER — Encounter (HOSPITAL_COMMUNITY): Payer: Self-pay | Admitting: *Deleted

## 2017-06-25 ENCOUNTER — Inpatient Hospital Stay (HOSPITAL_COMMUNITY)
Admission: RE | Admit: 2017-06-25 | Discharge: 2017-06-30 | DRG: 220 | Disposition: A | Discharge status: Home / Self Care | Payer: Managed Care, Other (non HMO) | Source: Ambulatory Visit | Attending: Cardiothoracic Surgery | Admitting: Cardiothoracic Surgery

## 2017-06-25 ENCOUNTER — Inpatient Hospital Stay (HOSPITAL_COMMUNITY): Payer: Managed Care, Other (non HMO) | Admitting: Certified Registered Nurse Anesthetist

## 2017-06-25 ENCOUNTER — Inpatient Hospital Stay (HOSPITAL_COMMUNITY): Payer: Managed Care, Other (non HMO) | Admitting: Vascular Surgery

## 2017-06-25 ENCOUNTER — Inpatient Hospital Stay (HOSPITAL_COMMUNITY): Payer: Managed Care, Other (non HMO)

## 2017-06-25 ENCOUNTER — Encounter (HOSPITAL_COMMUNITY)
Admission: RE | Disposition: A | Discharge status: Home / Self Care | Payer: Self-pay | Source: Ambulatory Visit | Attending: Cardiothoracic Surgery

## 2017-06-25 DIAGNOSIS — I2510 Atherosclerotic heart disease of native coronary artery without angina pectoris: Secondary | ICD-10-CM | POA: Diagnosis present

## 2017-06-25 DIAGNOSIS — R9431 Abnormal electrocardiogram [ECG] [EKG]: Secondary | ICD-10-CM | POA: Diagnosis not present

## 2017-06-25 DIAGNOSIS — J9811 Atelectasis: Secondary | ICD-10-CM

## 2017-06-25 DIAGNOSIS — Z01812 Encounter for preprocedural laboratory examination: Secondary | ICD-10-CM

## 2017-06-25 DIAGNOSIS — Q825 Congenital non-neoplastic nevus: Secondary | ICD-10-CM | POA: Diagnosis not present

## 2017-06-25 DIAGNOSIS — Z01810 Encounter for preprocedural cardiovascular examination: Secondary | ICD-10-CM

## 2017-06-25 DIAGNOSIS — E785 Hyperlipidemia, unspecified: Secondary | ICD-10-CM | POA: Diagnosis present

## 2017-06-25 DIAGNOSIS — Z7951 Long term (current) use of inhaled steroids: Secondary | ICD-10-CM

## 2017-06-25 DIAGNOSIS — Z953 Presence of xenogenic heart valve: Secondary | ICD-10-CM | POA: Diagnosis not present

## 2017-06-25 DIAGNOSIS — K219 Gastro-esophageal reflux disease without esophagitis: Secondary | ICD-10-CM | POA: Diagnosis present

## 2017-06-25 DIAGNOSIS — Q231 Congenital insufficiency of aortic valve: Secondary | ICD-10-CM | POA: Diagnosis present

## 2017-06-25 DIAGNOSIS — Z800 Family history of malignant neoplasm of digestive organs: Secondary | ICD-10-CM

## 2017-06-25 DIAGNOSIS — I252 Old myocardial infarction: Secondary | ICD-10-CM | POA: Diagnosis not present

## 2017-06-25 DIAGNOSIS — Z833 Family history of diabetes mellitus: Secondary | ICD-10-CM

## 2017-06-25 DIAGNOSIS — I110 Hypertensive heart disease with heart failure: Secondary | ICD-10-CM | POA: Diagnosis present

## 2017-06-25 DIAGNOSIS — Z7982 Long term (current) use of aspirin: Secondary | ICD-10-CM

## 2017-06-25 DIAGNOSIS — Z8249 Family history of ischemic heart disease and other diseases of the circulatory system: Secondary | ICD-10-CM

## 2017-06-25 DIAGNOSIS — Z87891 Personal history of nicotine dependence: Secondary | ICD-10-CM | POA: Diagnosis not present

## 2017-06-25 DIAGNOSIS — Z8261 Family history of arthritis: Secondary | ICD-10-CM | POA: Diagnosis not present

## 2017-06-25 DIAGNOSIS — I452 Bifascicular block: Secondary | ICD-10-CM | POA: Diagnosis present

## 2017-06-25 DIAGNOSIS — D62 Acute posthemorrhagic anemia: Secondary | ICD-10-CM | POA: Diagnosis not present

## 2017-06-25 DIAGNOSIS — I350 Nonrheumatic aortic (valve) stenosis: Secondary | ICD-10-CM | POA: Diagnosis not present

## 2017-06-25 DIAGNOSIS — M1990 Unspecified osteoarthritis, unspecified site: Secondary | ICD-10-CM | POA: Diagnosis present

## 2017-06-25 DIAGNOSIS — I313 Pericardial effusion (noninflammatory): Secondary | ICD-10-CM | POA: Diagnosis not present

## 2017-06-25 DIAGNOSIS — Q211 Atrial septal defect: Secondary | ICD-10-CM | POA: Diagnosis not present

## 2017-06-25 DIAGNOSIS — Z85820 Personal history of malignant melanoma of skin: Secondary | ICD-10-CM

## 2017-06-25 DIAGNOSIS — I739 Peripheral vascular disease, unspecified: Secondary | ICD-10-CM | POA: Diagnosis present

## 2017-06-25 DIAGNOSIS — I5032 Chronic diastolic (congestive) heart failure: Secondary | ICD-10-CM | POA: Diagnosis present

## 2017-06-25 DIAGNOSIS — D696 Thrombocytopenia, unspecified: Secondary | ICD-10-CM | POA: Diagnosis present

## 2017-06-25 DIAGNOSIS — Z951 Presence of aortocoronary bypass graft: Secondary | ICD-10-CM

## 2017-06-25 DIAGNOSIS — Z952 Presence of prosthetic heart valve: Secondary | ICD-10-CM

## 2017-06-25 HISTORY — PX: TEE WITHOUT CARDIOVERSION: SHX5443

## 2017-06-25 HISTORY — PX: CORONARY ARTERY BYPASS GRAFT: SHX141

## 2017-06-25 HISTORY — PX: AORTIC VALVE REPLACEMENT: SHX41

## 2017-06-25 LAB — POCT I-STAT, CHEM 8
BUN: 10 mg/dL (ref 6–20)
BUN: 9 mg/dL (ref 6–20)
BUN: 9 mg/dL (ref 6–20)
BUN: 9 mg/dL (ref 6–20)
BUN: 9 mg/dL (ref 6–20)
BUN: 8 mg/dL (ref 6–20)
BUN: 11 mg/dL (ref 6–20)
Calcium, Ion: 1.27 mmol/L (ref 1.15–1.40)
Calcium, Ion: 1.09 mmol/L — ABNORMAL LOW (ref 1.15–1.40)
Calcium, Ion: 1.24 mmol/L (ref 1.15–1.40)
Calcium, Ion: 1.12 mmol/L — ABNORMAL LOW (ref 1.15–1.40)
Calcium, Ion: 1.15 mmol/L (ref 1.15–1.40)
Calcium, Ion: 1.1 mmol/L — ABNORMAL LOW (ref 1.15–1.40)
Calcium, Ion: 1.17 mmol/L (ref 1.15–1.40)
Chloride: 105 mmol/L (ref 101–111)
Chloride: 104 mmol/L (ref 101–111)
Chloride: 106 mmol/L (ref 101–111)
Chloride: 104 mmol/L (ref 101–111)
Chloride: 106 mmol/L (ref 101–111)
Chloride: 106 mmol/L (ref 101–111)
Chloride: 106 mmol/L (ref 101–111)
Creatinine, Ser: 0.5 mg/dL — ABNORMAL LOW (ref 0.61–1.24)
Creatinine, Ser: 0.5 mg/dL — ABNORMAL LOW (ref 0.61–1.24)
Creatinine, Ser: 0.6 mg/dL — ABNORMAL LOW (ref 0.61–1.24)
Creatinine, Ser: 0.5 mg/dL — ABNORMAL LOW (ref 0.61–1.24)
Creatinine, Ser: 0.5 mg/dL — ABNORMAL LOW (ref 0.61–1.24)
Creatinine, Ser: 0.6 mg/dL — ABNORMAL LOW (ref 0.61–1.24)
Creatinine, Ser: 0.6 mg/dL — ABNORMAL LOW (ref 0.61–1.24)
Glucose, Bld: 99 mg/dL (ref 65–99)
Glucose, Bld: 110 mg/dL — ABNORMAL HIGH (ref 65–99)
Glucose, Bld: 120 mg/dL — ABNORMAL HIGH (ref 65–99)
Glucose, Bld: 146 mg/dL — ABNORMAL HIGH (ref 65–99)
Glucose, Bld: 145 mg/dL — ABNORMAL HIGH (ref 65–99)
Glucose, Bld: 116 mg/dL — ABNORMAL HIGH (ref 65–99)
Glucose, Bld: 135 mg/dL — ABNORMAL HIGH (ref 65–99)
HCT: 40 % (ref 39.0–52.0)
HCT: 33 % — ABNORMAL LOW (ref 39.0–52.0)
HCT: 37 % — ABNORMAL LOW (ref 39.0–52.0)
HCT: 31 % — ABNORMAL LOW (ref 39.0–52.0)
HCT: 32 % — ABNORMAL LOW (ref 39.0–52.0)
HCT: 28 % — ABNORMAL LOW (ref 39.0–52.0)
HCT: 31 % — ABNORMAL LOW (ref 39.0–52.0)
Hemoglobin: 13.6 g/dL (ref 13.0–17.0)
Hemoglobin: 11.2 g/dL — ABNORMAL LOW (ref 13.0–17.0)
Hemoglobin: 12.6 g/dL — ABNORMAL LOW (ref 13.0–17.0)
Hemoglobin: 10.5 g/dL — ABNORMAL LOW (ref 13.0–17.0)
Hemoglobin: 10.9 g/dL — ABNORMAL LOW (ref 13.0–17.0)
Hemoglobin: 9.5 g/dL — ABNORMAL LOW (ref 13.0–17.0)
Hemoglobin: 10.5 g/dL — ABNORMAL LOW (ref 13.0–17.0)
Potassium: 3.9 mmol/L (ref 3.5–5.1)
Potassium: 3.8 mmol/L (ref 3.5–5.1)
Potassium: 4.4 mmol/L (ref 3.5–5.1)
Potassium: 4.2 mmol/L (ref 3.5–5.1)
Potassium: 4.2 mmol/L (ref 3.5–5.1)
Potassium: 3.6 mmol/L (ref 3.5–5.1)
Potassium: 4.3 mmol/L (ref 3.5–5.1)
Sodium: 142 mmol/L (ref 135–145)
Sodium: 142 mmol/L (ref 135–145)
Sodium: 142 mmol/L (ref 135–145)
Sodium: 138 mmol/L (ref 135–145)
Sodium: 141 mmol/L (ref 135–145)
Sodium: 144 mmol/L (ref 135–145)
Sodium: 142 mmol/L (ref 135–145)
TCO2: 27 mmol/L (ref 22–32)
TCO2: 27 mmol/L (ref 22–32)
TCO2: 27 mmol/L (ref 22–32)
TCO2: 25 mmol/L (ref 22–32)
TCO2: 25 mmol/L (ref 22–32)
TCO2: 24 mmol/L (ref 22–32)
TCO2: 23 mmol/L (ref 22–32)

## 2017-06-25 LAB — GLUCOSE, CAPILLARY
Glucose-Capillary: 105 mg/dL — ABNORMAL HIGH (ref 65–99)
Glucose-Capillary: 115 mg/dL — ABNORMAL HIGH (ref 65–99)
Glucose-Capillary: 109 mg/dL — ABNORMAL HIGH (ref 65–99)
Glucose-Capillary: 108 mg/dL — ABNORMAL HIGH (ref 65–99)
Glucose-Capillary: 135 mg/dL — ABNORMAL HIGH (ref 65–99)
Glucose-Capillary: 136 mg/dL — ABNORMAL HIGH (ref 65–99)
Glucose-Capillary: 134 mg/dL — ABNORMAL HIGH (ref 65–99)

## 2017-06-25 LAB — HEMOGLOBIN AND HEMATOCRIT, BLOOD
HCT: 33.8 % — ABNORMAL LOW (ref 39.0–52.0)
Hemoglobin: 11.6 g/dL — ABNORMAL LOW (ref 13.0–17.0)

## 2017-06-25 LAB — POCT I-STAT 3, ART BLOOD GAS (G3+)
Acid-base deficit: 3 mmol/L — ABNORMAL HIGH (ref 0.0–2.0)
Acid-base deficit: 2 mmol/L (ref 0.0–2.0)
Acid-base deficit: 4 mmol/L — ABNORMAL HIGH (ref 0.0–2.0)
Acid-base deficit: 3 mmol/L — ABNORMAL HIGH (ref 0.0–2.0)
Bicarbonate: 25.6 mmol/L (ref 20.0–28.0)
Bicarbonate: 22.7 mmol/L (ref 20.0–28.0)
Bicarbonate: 23.5 mmol/L (ref 20.0–28.0)
Bicarbonate: 21.6 mmol/L (ref 20.0–28.0)
Bicarbonate: 23.8 mmol/L (ref 20.0–28.0)
O2 Saturation: 100 %
O2 Saturation: 100 %
O2 Saturation: 97 %
O2 Saturation: 98 %
O2 Saturation: 98 %
Patient temperature: 35.2
Patient temperature: 36.8
Patient temperature: 36.7
TCO2: 27 mmol/L (ref 22–32)
TCO2: 24 mmol/L (ref 22–32)
TCO2: 25 mmol/L (ref 22–32)
TCO2: 23 mmol/L (ref 22–32)
TCO2: 25 mmol/L (ref 22–32)
pCO2 arterial: 43.4 mmHg (ref 32.0–48.0)
pCO2 arterial: 43.3 mmHg (ref 32.0–48.0)
pCO2 arterial: 40.3 mmHg (ref 32.0–48.0)
pCO2 arterial: 39.2 mmHg (ref 32.0–48.0)
pCO2 arterial: 47 mmHg (ref 32.0–48.0)
pH, Arterial: 7.378 (ref 7.350–7.450)
pH, Arterial: 7.328 — ABNORMAL LOW (ref 7.350–7.450)
pH, Arterial: 7.366 (ref 7.350–7.450)
pH, Arterial: 7.349 — ABNORMAL LOW (ref 7.350–7.450)
pH, Arterial: 7.311 — ABNORMAL LOW (ref 7.350–7.450)
pO2, Arterial: 400 mmHg — ABNORMAL HIGH (ref 83.0–108.0)
pO2, Arterial: 264 mmHg — ABNORMAL HIGH (ref 83.0–108.0)
pO2, Arterial: 87 mmHg (ref 83.0–108.0)
pO2, Arterial: 114 mmHg — ABNORMAL HIGH (ref 83.0–108.0)
pO2, Arterial: 111 mmHg — ABNORMAL HIGH (ref 83.0–108.0)

## 2017-06-25 LAB — POCT I-STAT 4, (NA,K, GLUC, HGB,HCT)
Glucose, Bld: 94 mg/dL (ref 65–99)
HCT: 30 % — ABNORMAL LOW (ref 39.0–52.0)
Hemoglobin: 10.2 g/dL — ABNORMAL LOW (ref 13.0–17.0)
Potassium: 3.5 mmol/L (ref 3.5–5.1)
Sodium: 145 mmol/L (ref 135–145)

## 2017-06-25 LAB — CBC
HCT: 32.5 % — ABNORMAL LOW (ref 39.0–52.0)
HCT: 33.2 % — ABNORMAL LOW (ref 39.0–52.0)
Hemoglobin: 11.1 g/dL — ABNORMAL LOW (ref 13.0–17.0)
Hemoglobin: 11.7 g/dL — ABNORMAL LOW (ref 13.0–17.0)
MCH: 30.2 pg (ref 26.0–34.0)
MCH: 31.2 pg (ref 26.0–34.0)
MCHC: 34.2 g/dL (ref 30.0–36.0)
MCHC: 35.2 g/dL (ref 30.0–36.0)
MCV: 88.6 fL (ref 78.0–100.0)
MCV: 88.5 fL (ref 78.0–100.0)
PLATELETS: 87 10*3/uL — AB (ref 150–400)
Platelets: 97 10*3/uL — ABNORMAL LOW (ref 150–400)
RBC: 3.67 MIL/uL — ABNORMAL LOW (ref 4.22–5.81)
RBC: 3.75 MIL/uL — ABNORMAL LOW (ref 4.22–5.81)
RDW: 13 % (ref 11.5–15.5)
RDW: 13.2 % (ref 11.5–15.5)
WBC: 8.9 10*3/uL (ref 4.0–10.5)
WBC: 8.8 10*3/uL (ref 4.0–10.5)

## 2017-06-25 LAB — PROTIME-INR
INR: 1.54
Prothrombin Time: 18.4 seconds — ABNORMAL HIGH (ref 11.4–15.2)

## 2017-06-25 LAB — MAGNESIUM: Magnesium: 2.5 mg/dL — ABNORMAL HIGH (ref 1.7–2.4)

## 2017-06-25 LAB — APTT: APTT: 35 s (ref 24–36)

## 2017-06-25 LAB — CREATININE, SERUM
Creatinine, Ser: 0.79 mg/dL (ref 0.61–1.24)
GFR calc Af Amer: >60 mL/min (ref >60)
GFR calc non Af Amer: >60 mL/min (ref >60)

## 2017-06-25 LAB — PLATELET COUNT: Platelets: 84 10*3/uL — ABNORMAL LOW (ref 150–400)

## 2017-06-25 SURGERY — REPLACEMENT, AORTIC VALVE, OPEN
Anesthesia: General | Site: Chest

## 2017-06-25 MED ORDER — POTASSIUM CHLORIDE 10 MEQ/50ML IV SOLN
10.0000 meq | INTRAVENOUS | Status: AC
  Administered 2017-06-25 (×3): 10 meq via INTRAVENOUS

## 2017-06-25 MED ORDER — MILRINONE LACTATE IN DEXTROSE 20-5 MG/100ML-% IV SOLN
0.2500 ug/kg/min | INTRAVENOUS | Status: DC
  Administered 2017-06-26: 0.25 ug/kg/min via INTRAVENOUS
  Filled 2017-06-25: qty 100

## 2017-06-25 MED ORDER — LACTATED RINGERS IV SOLN
INTRAVENOUS | Status: DC | PRN
  Administered 2017-06-25: 07:00:00 via INTRAVENOUS

## 2017-06-25 MED ORDER — SODIUM CHLORIDE 0.9 % IV SOLN: INTRAVENOUS | Status: DC

## 2017-06-25 MED ORDER — MIDAZOLAM HCL 2 MG/2ML IJ SOLN
INTRAMUSCULAR | Status: AC
  Filled 2017-06-25: qty 2

## 2017-06-25 MED ORDER — SODIUM CHLORIDE 0.45 % IV SOLN
INTRAVENOUS | Status: DC | PRN
  Administered 2017-06-25: 15:00:00 via INTRAVENOUS

## 2017-06-25 MED ORDER — LACTATED RINGERS IV SOLN
INTRAVENOUS | Status: DC | PRN
  Administered 2017-06-25 (×2): via INTRAVENOUS

## 2017-06-25 MED ORDER — LACTATED RINGERS IV SOLN
INTRAVENOUS | Status: DC
  Administered 2017-06-25: 19:00:00 via INTRAVENOUS

## 2017-06-25 MED ORDER — PHENYLEPHRINE 40 MCG/ML (10ML) SYRINGE FOR IV PUSH (FOR BLOOD PRESSURE SUPPORT)
PREFILLED_SYRINGE | INTRAVENOUS | Status: AC
  Filled 2017-06-25: qty 10

## 2017-06-25 MED ORDER — DEXMEDETOMIDINE HCL IN NACL 400 MCG/100ML IV SOLN
0.0000 ug/kg/h | INTRAVENOUS | Status: DC
  Administered 2017-06-25: 0.7 ug/kg/h via INTRAVENOUS
  Filled 2017-06-25: qty 100

## 2017-06-25 MED ORDER — MUPIROCIN 2 % EX OINT
1.0000 "application " | TOPICAL_OINTMENT | Freq: Two times a day (BID) | CUTANEOUS | Status: AC
  Administered 2017-06-25 – 2017-06-29 (×10): 1 via NASAL
  Filled 2017-06-25 (×3): qty 22

## 2017-06-25 MED ORDER — CHLORHEXIDINE GLUCONATE CLOTH 2 % EX PADS
6.0000 | MEDICATED_PAD | Freq: Every day | CUTANEOUS | Status: AC
  Administered 2017-06-25 – 2017-06-28 (×4): 6 via TOPICAL

## 2017-06-25 MED ORDER — METOPROLOL TARTRATE 12.5 MG HALF TABLET
ORAL_TABLET | ORAL | Status: AC
  Administered 2017-06-25: 12.5 mg via ORAL
  Filled 2017-06-25: qty 1

## 2017-06-25 MED ORDER — PANTOPRAZOLE SODIUM 40 MG PO TBEC
40.0000 mg | DELAYED_RELEASE_TABLET | Freq: Every day | ORAL | Status: DC
  Administered 2017-06-27 – 2017-06-30 (×4): 40 mg via ORAL
  Filled 2017-06-25 (×4): qty 1

## 2017-06-25 MED ORDER — SODIUM CHLORIDE 0.9% FLUSH: 3.0000 mL | INTRAVENOUS | Status: DC | PRN

## 2017-06-25 MED ORDER — TRAMADOL HCL 50 MG PO TABS
50.0000 mg | ORAL_TABLET | ORAL | Status: DC | PRN
  Administered 2017-06-26 (×2): 50 mg via ORAL
  Administered 2017-06-27: 100 mg via ORAL
  Filled 2017-06-25: qty 1
  Filled 2017-06-25: qty 2
  Filled 2017-06-25: qty 1

## 2017-06-25 MED ORDER — 0.9 % SODIUM CHLORIDE (POUR BTL) OPTIME
TOPICAL | Status: DC | PRN
  Administered 2017-06-25: 6000 mL

## 2017-06-25 MED ORDER — PROTAMINE SULFATE 10 MG/ML IV SOLN
INTRAVENOUS | Status: DC | PRN
  Administered 2017-06-25: 270 mg via INTRAVENOUS
  Administered 2017-06-25: 30 mg via INTRAVENOUS

## 2017-06-25 MED ORDER — MORPHINE SULFATE (PF) 2 MG/ML IV SOLN
2.0000 mg | INTRAVENOUS | Status: DC | PRN
  Administered 2017-06-25: 2 mg via INTRAVENOUS
  Administered 2017-06-26: 4 mg via INTRAVENOUS
  Filled 2017-06-25: qty 2
  Filled 2017-06-25: qty 1

## 2017-06-25 MED ORDER — ACETAMINOPHEN 500 MG PO TABS
1000.0000 mg | ORAL_TABLET | Freq: Four times a day (QID) | ORAL | Status: DC
  Administered 2017-06-25 – 2017-06-30 (×17): 1000 mg via ORAL
  Filled 2017-06-25 (×17): qty 2

## 2017-06-25 MED ORDER — SODIUM CHLORIDE 0.9 % IV SOLN
INTRAVENOUS | Status: DC | PRN
  Administered 2017-06-25 (×2): via INTRAVENOUS

## 2017-06-25 MED ORDER — ORAL CARE MOUTH RINSE
15.0000 mL | Freq: Two times a day (BID) | OROMUCOSAL | Status: DC
  Administered 2017-06-26 – 2017-06-30 (×7): 15 mL via OROMUCOSAL

## 2017-06-25 MED ORDER — PHENYLEPHRINE HCL 10 MG/ML IJ SOLN
INTRAMUSCULAR | Status: DC | PRN
  Administered 2017-06-25 (×3): 40 ug via INTRAVENOUS

## 2017-06-25 MED ORDER — HEMOSTATIC AGENTS (NO CHARGE) OPTIME
TOPICAL | Status: DC | PRN
  Administered 2017-06-25: 1 via TOPICAL

## 2017-06-25 MED ORDER — SODIUM CHLORIDE 0.9 % IV SOLN
INTRAVENOUS | Status: DC
  Administered 2017-06-25: 15:00:00 via INTRAVENOUS

## 2017-06-25 MED ORDER — METOPROLOL TARTRATE 25 MG/10 ML ORAL SUSPENSION: 12.5000 mg | Freq: Two times a day (BID) | ORAL | Status: DC

## 2017-06-25 MED ORDER — ACETAMINOPHEN 650 MG RE SUPP
650.0000 mg | Freq: Once | RECTAL | Status: AC
  Administered 2017-06-25: 650 mg via RECTAL

## 2017-06-25 MED ORDER — SODIUM CHLORIDE 0.9 % IV SOLN: 250.0000 mL | INTRAVENOUS | Status: DC

## 2017-06-25 MED ORDER — CHLORHEXIDINE GLUCONATE 0.12% ORAL RINSE (MEDLINE KIT): 15.0000 mL | Freq: Two times a day (BID) | OROMUCOSAL | Status: DC

## 2017-06-25 MED ORDER — SODIUM CHLORIDE 0.9% FLUSH
10.0000 mL | Freq: Two times a day (BID) | INTRAVENOUS | Status: DC
  Administered 2017-06-25: 10 mL

## 2017-06-25 MED ORDER — ORAL CARE MOUTH RINSE
15.0000 mL | OROMUCOSAL | Status: DC
  Administered 2017-06-25: 15 mL via OROMUCOSAL

## 2017-06-25 MED ORDER — HEPARIN SODIUM (PORCINE) 1000 UNIT/ML IJ SOLN
INTRAMUSCULAR | Status: DC | PRN
  Administered 2017-06-25 (×2): 2000 [IU] via INTRAVENOUS
  Administered 2017-06-25: 28000 [IU] via INTRAVENOUS

## 2017-06-25 MED ORDER — CEFAZOLIN SODIUM-DEXTROSE 2-4 GM/100ML-% IV SOLN
2.0000 g | Freq: Three times a day (TID) | INTRAVENOUS | Status: AC
  Administered 2017-06-25 – 2017-06-27 (×6): 2 g via INTRAVENOUS
  Filled 2017-06-25 (×6): qty 100

## 2017-06-25 MED ORDER — METOCLOPRAMIDE HCL 5 MG/ML IJ SOLN
10.0000 mg | Freq: Four times a day (QID) | INTRAMUSCULAR | Status: DC
  Administered 2017-06-25 – 2017-06-27 (×7): 10 mg via INTRAVENOUS
  Filled 2017-06-25 (×7): qty 2

## 2017-06-25 MED ORDER — ACETAMINOPHEN 160 MG/5ML PO SOLN: 650.0000 mg | Freq: Once | ORAL | Status: AC

## 2017-06-25 MED ORDER — SODIUM CHLORIDE 0.9 % IV SOLN
20.0000 ug | INTRAVENOUS | Status: AC
  Administered 2017-06-25: 20 ug via INTRAVENOUS
  Filled 2017-06-25: qty 5

## 2017-06-25 MED ORDER — METOPROLOL TARTRATE 12.5 MG HALF TABLET
12.5000 mg | ORAL_TABLET | Freq: Two times a day (BID) | ORAL | Status: DC
  Administered 2017-06-26 – 2017-06-28 (×5): 12.5 mg via ORAL
  Filled 2017-06-25 (×5): qty 1

## 2017-06-25 MED ORDER — LACTATED RINGERS IV SOLN: 500.0000 mL | Freq: Once | INTRAVENOUS | Status: DC | PRN

## 2017-06-25 MED ORDER — BISACODYL 10 MG RE SUPP: 10.0000 mg | Freq: Every day | RECTAL | Status: DC

## 2017-06-25 MED ORDER — SODIUM CHLORIDE 0.9% FLUSH: 10.0000 mL | INTRAVENOUS | Status: DC | PRN

## 2017-06-25 MED ORDER — HEPARIN SODIUM (PORCINE) 1000 UNIT/ML IJ SOLN
INTRAMUSCULAR | Status: AC
  Filled 2017-06-25: qty 1

## 2017-06-25 MED ORDER — PROPOFOL 10 MG/ML IV BOLUS
INTRAVENOUS | Status: DC | PRN
  Administered 2017-06-25: 40 mg via INTRAVENOUS

## 2017-06-25 MED ORDER — ACETAMINOPHEN 160 MG/5ML PO SOLN: 1000.0000 mg | Freq: Four times a day (QID) | ORAL | Status: DC

## 2017-06-25 MED ORDER — CHLORHEXIDINE GLUCONATE 0.12 % MT SOLN
15.0000 mL | OROMUCOSAL | Status: AC
  Administered 2017-06-25: 15 mL via OROMUCOSAL

## 2017-06-25 MED ORDER — ONDANSETRON HCL 4 MG/2ML IJ SOLN
INTRAMUSCULAR | Status: AC
  Filled 2017-06-25: qty 2

## 2017-06-25 MED ORDER — ALBUMIN HUMAN 5 % IV SOLN
INTRAVENOUS | Status: DC | PRN
  Administered 2017-06-25: 14:00:00 via INTRAVENOUS

## 2017-06-25 MED ORDER — VANCOMYCIN HCL 10 G IV SOLR
1500.0000 mg | INTRAVENOUS | Status: AC
  Administered 2017-06-25: 1500 mg via INTRAVENOUS
  Filled 2017-06-25: qty 1000

## 2017-06-25 MED ORDER — METOPROLOL TARTRATE 5 MG/5ML IV SOLN
2.5000 mg | INTRAVENOUS | Status: DC | PRN
  Administered 2017-06-27: 5 mg via INTRAVENOUS
  Filled 2017-06-25: qty 5

## 2017-06-25 MED ORDER — MORPHINE SULFATE (PF) 2 MG/ML IV SOLN: 1.0000 mg | INTRAVENOUS | Status: DC | PRN

## 2017-06-25 MED ORDER — BISACODYL 5 MG PO TBEC
10.0000 mg | DELAYED_RELEASE_TABLET | Freq: Every day | ORAL | Status: DC
  Administered 2017-06-26: 10 mg via ORAL
  Filled 2017-06-25 (×2): qty 2

## 2017-06-25 MED ORDER — ROCURONIUM BROMIDE 10 MG/ML (PF) SYRINGE
PREFILLED_SYRINGE | INTRAVENOUS | Status: AC
  Filled 2017-06-25: qty 5

## 2017-06-25 MED ORDER — ALBUMIN HUMAN 5 % IV SOLN
250.0000 mL | INTRAVENOUS | Status: AC | PRN
  Administered 2017-06-25: 250 mL via INTRAVENOUS

## 2017-06-25 MED ORDER — SODIUM CHLORIDE 0.9% FLUSH
3.0000 mL | Freq: Two times a day (BID) | INTRAVENOUS | Status: DC
  Administered 2017-06-26 – 2017-06-30 (×8): 3 mL via INTRAVENOUS

## 2017-06-25 MED ORDER — METOPROLOL TARTRATE 12.5 MG HALF TABLET
12.5000 mg | ORAL_TABLET | Freq: Once | ORAL | Status: AC
  Administered 2017-06-25: 12.5 mg via ORAL

## 2017-06-25 MED ORDER — VANCOMYCIN HCL IN DEXTROSE 1-5 GM/200ML-% IV SOLN
1000.0000 mg | INTRAVENOUS | Status: AC
  Administered 2017-06-25 – 2017-06-26 (×2): 1000 mg via INTRAVENOUS
  Filled 2017-06-25 (×2): qty 200

## 2017-06-25 MED ORDER — EPHEDRINE 5 MG/ML INJ
INTRAVENOUS | Status: AC
  Filled 2017-06-25: qty 10

## 2017-06-25 MED ORDER — COQ-10 200 MG PO CAPS: 200.0000 mg | ORAL_CAPSULE | Freq: Every day | ORAL | Status: DC

## 2017-06-25 MED ORDER — CHLORHEXIDINE GLUCONATE 0.12 % MT SOLN
OROMUCOSAL | Status: AC
  Administered 2017-06-25: 15 mL via OROMUCOSAL
  Filled 2017-06-25: qty 15

## 2017-06-25 MED ORDER — CHLORHEXIDINE GLUCONATE 0.12 % MT SOLN
15.0000 mL | Freq: Once | OROMUCOSAL | Status: AC
  Administered 2017-06-25: 15 mL via OROMUCOSAL

## 2017-06-25 MED ORDER — PROPOFOL 10 MG/ML IV BOLUS
INTRAVENOUS | Status: AC
  Filled 2017-06-25: qty 20

## 2017-06-25 MED ORDER — LACTATED RINGERS IV SOLN: INTRAVENOUS | Status: DC

## 2017-06-25 MED ORDER — SODIUM CHLORIDE 0.9 % IV SOLN
0.0000 ug/min | INTRAVENOUS | Status: DC
  Filled 2017-06-25: qty 2

## 2017-06-25 MED ORDER — FENTANYL CITRATE (PF) 250 MCG/5ML IJ SOLN
INTRAMUSCULAR | Status: AC
  Filled 2017-06-25: qty 25

## 2017-06-25 MED ORDER — CEFUROXIME SODIUM 750 MG IJ SOLR
INTRAMUSCULAR | Status: DC | PRN
  Administered 2017-06-25: 750 mg via INTRAVENOUS

## 2017-06-25 MED ORDER — MAGNESIUM SULFATE 4 GM/100ML IV SOLN
4.0000 g | Freq: Once | INTRAVENOUS | Status: AC
  Administered 2017-06-25: 4 g via INTRAVENOUS
  Filled 2017-06-25: qty 100

## 2017-06-25 MED ORDER — OXYCODONE HCL 5 MG PO TABS
5.0000 mg | ORAL_TABLET | ORAL | Status: DC | PRN
  Administered 2017-06-25 – 2017-06-26 (×2): 10 mg via ORAL
  Filled 2017-06-25 (×3): qty 2

## 2017-06-25 MED ORDER — FAMOTIDINE IN NACL 20-0.9 MG/50ML-% IV SOLN
20.0000 mg | Freq: Two times a day (BID) | INTRAVENOUS | Status: DC
  Administered 2017-06-25: 20 mg via INTRAVENOUS

## 2017-06-25 MED ORDER — SODIUM CHLORIDE 0.9% FLUSH
10.0000 mL | Freq: Two times a day (BID) | INTRAVENOUS | Status: DC
  Administered 2017-06-25 – 2017-06-27 (×3): 10 mL

## 2017-06-25 MED ORDER — CHLORHEXIDINE GLUCONATE 4 % EX LIQD: 30.0000 mL | CUTANEOUS | Status: DC

## 2017-06-25 MED ORDER — FENTANYL CITRATE (PF) 250 MCG/5ML IJ SOLN
INTRAMUSCULAR | Status: AC
  Filled 2017-06-25: qty 5

## 2017-06-25 MED ORDER — FENTANYL CITRATE (PF) 250 MCG/5ML IJ SOLN
INTRAMUSCULAR | Status: DC | PRN
  Administered 2017-06-25: 100 ug via INTRAVENOUS
  Administered 2017-06-25: 50 ug via INTRAVENOUS
  Administered 2017-06-25 (×2): 100 ug via INTRAVENOUS
  Administered 2017-06-25: 50 ug via INTRAVENOUS
  Administered 2017-06-25: 150 ug via INTRAVENOUS
  Administered 2017-06-25: 50 ug via INTRAVENOUS
  Administered 2017-06-25: 100 ug via INTRAVENOUS
  Administered 2017-06-25: 50 ug via INTRAVENOUS
  Administered 2017-06-25: 300 ug via INTRAVENOUS
  Administered 2017-06-25 (×3): 50 ug via INTRAVENOUS
  Administered 2017-06-25: 100 ug via INTRAVENOUS
  Administered 2017-06-25: 200 ug via INTRAVENOUS

## 2017-06-25 MED ORDER — MIDAZOLAM HCL 10 MG/2ML IJ SOLN
INTRAMUSCULAR | Status: AC
  Filled 2017-06-25: qty 2

## 2017-06-25 MED ORDER — ROCURONIUM BROMIDE 10 MG/ML (PF) SYRINGE
PREFILLED_SYRINGE | INTRAVENOUS | Status: DC | PRN
  Administered 2017-06-25: 100 mg via INTRAVENOUS
  Administered 2017-06-25 (×2): 50 mg via INTRAVENOUS
  Administered 2017-06-25: 100 mg via INTRAVENOUS

## 2017-06-25 MED ORDER — INSULIN REGULAR BOLUS VIA INFUSION
0.0000 [IU] | Freq: Three times a day (TID) | INTRAVENOUS | Status: DC
  Filled 2017-06-25: qty 10

## 2017-06-25 MED ORDER — ARTIFICIAL TEARS OPHTHALMIC OINT
TOPICAL_OINTMENT | OPHTHALMIC | Status: DC | PRN
  Administered 2017-06-25: 1 via OPHTHALMIC

## 2017-06-25 MED ORDER — SODIUM CHLORIDE 0.9 % IJ SOLN
OROMUCOSAL | Status: DC | PRN
  Administered 2017-06-25 (×3): 4 mL via TOPICAL

## 2017-06-25 MED ORDER — NITROGLYCERIN IN D5W 200-5 MCG/ML-% IV SOLN: 0.0000 ug/min | INTRAVENOUS | Status: DC

## 2017-06-25 MED ORDER — PROTAMINE SULFATE 10 MG/ML IV SOLN
INTRAVENOUS | Status: AC
  Filled 2017-06-25: qty 25

## 2017-06-25 MED ORDER — CHLORHEXIDINE GLUCONATE CLOTH 2 % EX PADS
6.0000 | MEDICATED_PAD | Freq: Every day | CUTANEOUS | Status: DC
  Administered 2017-06-25: 6 via TOPICAL

## 2017-06-25 MED ORDER — ASPIRIN EC 325 MG PO TBEC: 325.0000 mg | DELAYED_RELEASE_TABLET | Freq: Every day | ORAL | Status: DC

## 2017-06-25 MED ORDER — ONDANSETRON HCL 4 MG/2ML IJ SOLN: 4.0000 mg | Freq: Four times a day (QID) | INTRAMUSCULAR | Status: DC | PRN

## 2017-06-25 MED ORDER — SUCCINYLCHOLINE CHLORIDE 200 MG/10ML IV SOSY
PREFILLED_SYRINGE | INTRAVENOUS | Status: AC
  Filled 2017-06-25: qty 10

## 2017-06-25 MED ORDER — MIDAZOLAM HCL 10 MG/2ML IJ SOLN
INTRAMUSCULAR | Status: DC | PRN
  Administered 2017-06-25 (×2): 1 mg via INTRAVENOUS
  Administered 2017-06-25 (×2): 2 mg via INTRAVENOUS
  Administered 2017-06-25: 3 mg via INTRAVENOUS
  Administered 2017-06-25 (×4): 2 mg via INTRAVENOUS
  Administered 2017-06-25: 3 mg via INTRAVENOUS

## 2017-06-25 MED ORDER — ATORVASTATIN CALCIUM 80 MG PO TABS
80.0000 mg | ORAL_TABLET | Freq: Every day | ORAL | Status: DC
  Administered 2017-06-26 – 2017-06-30 (×5): 80 mg via ORAL
  Filled 2017-06-25 (×5): qty 1

## 2017-06-25 MED ORDER — POTASSIUM CHLORIDE 10 MEQ/50ML IV SOLN
10.0000 meq | Freq: Once | INTRAVENOUS | Status: AC
  Administered 2017-06-25: 10 meq via INTRAVENOUS
  Filled 2017-06-25: qty 50

## 2017-06-25 MED ORDER — MIDAZOLAM HCL 2 MG/2ML IJ SOLN: 2.0000 mg | INTRAMUSCULAR | Status: DC | PRN

## 2017-06-25 MED ORDER — ASPIRIN 81 MG PO CHEW: 324.0000 mg | CHEWABLE_TABLET | Freq: Every day | ORAL | Status: DC

## 2017-06-25 MED ORDER — DOCUSATE SODIUM 100 MG PO CAPS
200.0000 mg | ORAL_CAPSULE | Freq: Every day | ORAL | Status: DC
  Administered 2017-06-26 – 2017-06-30 (×3): 200 mg via ORAL
  Filled 2017-06-25 (×4): qty 2

## 2017-06-25 SURGICAL SUPPLY — 124 items
ADAPTER CARDIO PERF ANTE/RETRO (ADAPTER) ×4 IMPLANT
BAG DECANTER FOR FLEXI CONT (MISCELLANEOUS) ×4 IMPLANT
BANDAGE ACE 4X5 VEL STRL LF (GAUZE/BANDAGES/DRESSINGS) ×4 IMPLANT
BANDAGE ACE 6X5 VEL STRL LF (GAUZE/BANDAGES/DRESSINGS) ×4 IMPLANT
BANDAGE ELASTIC 4 VELCRO ST LF (GAUZE/BANDAGES/DRESSINGS) ×4 IMPLANT
BASKET HEART  (ORDER IN 25'S) (MISCELLANEOUS) ×1
BASKET HEART (ORDER IN 25'S) (MISCELLANEOUS) ×1
BASKET HEART (ORDER IN 25S) (MISCELLANEOUS) ×2 IMPLANT
BLADE CLIPPER SURG (BLADE) IMPLANT
BLADE STERNUM SYSTEM 6 (BLADE) ×4 IMPLANT
BLADE SURG 11 STRL SS (BLADE) ×4 IMPLANT
BLADE SURG 12 STRL SS (BLADE) ×4 IMPLANT
BLADE SURG 15 STRL LF DISP TIS (BLADE) ×2 IMPLANT
BLADE SURG 15 STRL SS (BLADE) ×2
BNDG GAUZE ELAST 4 BULKY (GAUZE/BANDAGES/DRESSINGS) ×4 IMPLANT
CANISTER SUCT 3000ML PPV (MISCELLANEOUS) ×4 IMPLANT
CANNULA GUNDRY RCSP 15FR (MISCELLANEOUS) ×4 IMPLANT
CATH CPB KIT VANTRIGT (MISCELLANEOUS) ×4 IMPLANT
CATH HEART VENT LEFT (CATHETERS) ×2 IMPLANT
CATH RETROPLEGIA CORONARY 14FR (CATHETERS) IMPLANT
CATH ROBINSON RED A/P 18FR (CATHETERS) ×12 IMPLANT
CATH THORACIC 36FR RT ANG (CATHETERS) ×4 IMPLANT
CLIP FOGARTY SPRING 6M (CLIP) IMPLANT
CLIP VESOCCLUDE SM WIDE 24/CT (CLIP) ×4 IMPLANT
CONT SPEC 4OZ CLIKSEAL STRL BL (MISCELLANEOUS) ×4 IMPLANT
COVER MAYO STAND STRL (DRAPES) ×4 IMPLANT
COVER SURGICAL LIGHT HANDLE (MISCELLANEOUS) ×8 IMPLANT
CRADLE DONUT ADULT HEAD (MISCELLANEOUS) ×4 IMPLANT
DERMABOND ADVANCED (GAUZE/BANDAGES/DRESSINGS) ×2
DERMABOND ADVANCED .7 DNX12 (GAUZE/BANDAGES/DRESSINGS) ×2 IMPLANT
DRAIN CHANNEL 32F RND 10.7 FF (WOUND CARE) ×4 IMPLANT
DRAPE CARDIOVASCULAR INCISE (DRAPES) ×2
DRAPE SLUSH/WARMER DISC (DRAPES) ×4 IMPLANT
DRAPE SRG 135X102X78XABS (DRAPES) ×2 IMPLANT
DRSG AQUACEL AG ADV 3.5X14 (GAUZE/BANDAGES/DRESSINGS) ×4 IMPLANT
ELECT BLADE 4.0 EZ CLEAN MEGAD (MISCELLANEOUS) ×4
ELECT BLADE 6.5 EXT (BLADE) ×4 IMPLANT
ELECT CAUTERY BLADE 6.4 (BLADE) ×4 IMPLANT
ELECT REM PT RETURN 9FT ADLT (ELECTROSURGICAL) ×8
ELECTRODE BLDE 4.0 EZ CLN MEGD (MISCELLANEOUS) ×2 IMPLANT
ELECTRODE REM PT RTRN 9FT ADLT (ELECTROSURGICAL) ×4 IMPLANT
FELT TEFLON 1X6 (MISCELLANEOUS) ×8 IMPLANT
FLOSEAL 10ML (HEMOSTASIS) ×4 IMPLANT
GAUZE SPONGE 4X4 12PLY STRL (GAUZE/BANDAGES/DRESSINGS) ×8 IMPLANT
GLOVE BIO SURGEON STRL SZ 6.5 (GLOVE) ×9 IMPLANT
GLOVE BIO SURGEON STRL SZ7.5 (GLOVE) ×12 IMPLANT
GLOVE BIO SURGEONS STRL SZ 6.5 (GLOVE) ×3
GLOVE BIOGEL PI IND STRL 6 (GLOVE) ×8 IMPLANT
GLOVE BIOGEL PI IND STRL 8 (GLOVE) ×6 IMPLANT
GLOVE BIOGEL PI INDICATOR 6 (GLOVE) ×8
GLOVE BIOGEL PI INDICATOR 8 (GLOVE) ×6
GLOVE ECLIPSE 8.0 STRL XLNG CF (GLOVE) ×4 IMPLANT
GOWN STRL REUS W/ TWL LRG LVL3 (GOWN DISPOSABLE) ×16 IMPLANT
GOWN STRL REUS W/TWL LRG LVL3 (GOWN DISPOSABLE) ×16
HEMOSTAT POWDER SURGIFOAM 1G (HEMOSTASIS) ×12 IMPLANT
HEMOSTAT SURGICEL 2X14 (HEMOSTASIS) ×4 IMPLANT
INSERT FOGARTY XLG (MISCELLANEOUS) IMPLANT
KIT BASIN OR (CUSTOM PROCEDURE TRAY) ×4 IMPLANT
KIT ROOM TURNOVER OR (KITS) ×4 IMPLANT
KIT SUCTION CATH 14FR (SUCTIONS) ×4 IMPLANT
KIT VASOVIEW HEMOPRO VH 3000 (KITS) ×4 IMPLANT
LEAD PACING MYOCARDI (MISCELLANEOUS) ×4 IMPLANT
LINE VENT (MISCELLANEOUS) ×4 IMPLANT
MARKER GRAFT CORONARY BYPASS (MISCELLANEOUS) ×12 IMPLANT
NS IRRIG 1000ML POUR BTL (IV SOLUTION) ×24 IMPLANT
PACK E OPEN HEART (SUTURE) ×4 IMPLANT
PACK OPEN HEART (CUSTOM PROCEDURE TRAY) ×4 IMPLANT
PAD ARMBOARD 7.5X6 YLW CONV (MISCELLANEOUS) ×8 IMPLANT
PAD ELECT DEFIB RADIOL ZOLL (MISCELLANEOUS) ×4 IMPLANT
PENCIL BUTTON HOLSTER BLD 10FT (ELECTRODE) ×4 IMPLANT
POWDER SURGICEL 3.0 GRAM (HEMOSTASIS) ×8 IMPLANT
PUNCH AORTIC ROTATE  4.5MM 8IN (MISCELLANEOUS) ×4 IMPLANT
PUNCH AORTIC ROTATE 4.0MM (MISCELLANEOUS) IMPLANT
PUNCH AORTIC ROTATE 4.5MM 8IN (MISCELLANEOUS) IMPLANT
PUNCH AORTIC ROTATE 5MM 8IN (MISCELLANEOUS) IMPLANT
SET CARDIOPLEGIA MPS 5001102 (MISCELLANEOUS) ×4 IMPLANT
SPONGE LAP 18X18 X RAY DECT (DISPOSABLE) ×12 IMPLANT
SPONGE LAP 4X18 X RAY DECT (DISPOSABLE) ×8 IMPLANT
SURGIFLO W/THROMBIN 8M KIT (HEMOSTASIS) ×4 IMPLANT
SUT BONE WAX W31G (SUTURE) ×4 IMPLANT
SUT ETHIBON 2 0 V 52N 30 (SUTURE) ×8 IMPLANT
SUT ETHIBOND 2 0 SH (SUTURE) ×2
SUT ETHIBOND 2 0 SH 36X2 (SUTURE) ×2 IMPLANT
SUT MNCRL AB 4-0 PS2 18 (SUTURE) ×4 IMPLANT
SUT PROLENE 3 0 RB 1 (SUTURE) ×4 IMPLANT
SUT PROLENE 3 0 SH 1 (SUTURE) IMPLANT
SUT PROLENE 3 0 SH 48 (SUTURE) ×8 IMPLANT
SUT PROLENE 3 0 SH DA (SUTURE) IMPLANT
SUT PROLENE 3 0 SH1 36 (SUTURE) IMPLANT
SUT PROLENE 4 0 RB 1 (SUTURE) ×22
SUT PROLENE 4 0 SH DA (SUTURE) ×8 IMPLANT
SUT PROLENE 4-0 RB1 .5 CRCL 36 (SUTURE) ×22 IMPLANT
SUT PROLENE 5 0 C 1 36 (SUTURE) IMPLANT
SUT PROLENE 6 0 C 1 30 (SUTURE) ×28 IMPLANT
SUT PROLENE 6 0 CC (SUTURE) ×12 IMPLANT
SUT PROLENE 8 0 BV175 6 (SUTURE) IMPLANT
SUT PROLENE BLUE 7 0 (SUTURE) ×4 IMPLANT
SUT SILK  1 MH (SUTURE)
SUT SILK 1 MH (SUTURE) IMPLANT
SUT SILK 2 0 SH CR/8 (SUTURE) IMPLANT
SUT SILK 3 0 SH CR/8 (SUTURE) IMPLANT
SUT STEEL 6MS V (SUTURE) ×8 IMPLANT
SUT STEEL SZ 6 DBL 3X14 BALL (SUTURE) ×4 IMPLANT
SUT VIC AB 1 CTX 36 (SUTURE) ×6
SUT VIC AB 1 CTX36XBRD ANBCTR (SUTURE) ×6 IMPLANT
SUT VIC AB 2-0 CT1 27 (SUTURE) ×2
SUT VIC AB 2-0 CT1 TAPERPNT 27 (SUTURE) ×2 IMPLANT
SUT VIC AB 2-0 CTX 27 (SUTURE) IMPLANT
SUT VIC AB 3-0 X1 27 (SUTURE) IMPLANT
SYR 10ML KIT SKIN ADHESIVE (MISCELLANEOUS) IMPLANT
SYSTEM SAHARA CHEST DRAIN ATS (WOUND CARE) ×4 IMPLANT
TAPE CLOTH SURG 4X10 WHT LF (GAUZE/BANDAGES/DRESSINGS) ×4 IMPLANT
TAPE PAPER 2X10 WHT MICROPORE (GAUZE/BANDAGES/DRESSINGS) ×4 IMPLANT
TOWEL GREEN STERILE (TOWEL DISPOSABLE) ×4 IMPLANT
TOWEL GREEN STERILE FF (TOWEL DISPOSABLE) ×4 IMPLANT
TRAY FOLEY SILVER 16FR TEMP (SET/KITS/TRAYS/PACK) ×4 IMPLANT
TUBE CONNECTING 12'X1/4 (SUCTIONS) ×1
TUBE CONNECTING 12X1/4 (SUCTIONS) ×3 IMPLANT
TUBING INSUFFLATION (TUBING) ×4 IMPLANT
UNDERPAD 30X30 (UNDERPADS AND DIAPERS) ×4 IMPLANT
VALVE MAGNA EASE AORTIC 23MM (Prosthesis & Implant Heart) ×4 IMPLANT
VENT LEFT HEART 12002 (CATHETERS) ×4
WATER STERILE IRR 1000ML POUR (IV SOLUTION) ×8 IMPLANT
YANKAUER SUCT BULB TIP NO VENT (SUCTIONS) ×4 IMPLANT

## 2017-06-25 NOTE — Transfer of Care (Signed)
Immediate Anesthesia Transfer of Care Note  Patient: Todd Frank  Procedure(s) Performed: AORTIC VALVE REPLACEMENT (AVR) using Magna Ease Aortic Valve size 45mm (N/A Chest) CORONARY ARTERY BYPASS GRAFTING (CABG) x Two , using left internal mammary artery and right leg greater saphenous vein harvested endoscopically (N/A Chest) TRANSESOPHAGEAL ECHOCARDIOGRAM (TEE) (N/A )  Patient Location: SICU  Anesthesia Type:General  Level of Consciousness: Patient remains intubated per anesthesia plan  Airway & Oxygen Therapy: Pt on ventilator  Post-op Assessment: Report given to RN and Post -op Vital signs reviewed and stable  Post vital signs: Reviewed and stable  Last Vitals:  Vitals Value Taken Time  BP    Temp    Pulse    Resp    SpO2     HR 80, BP 116/56, RR 14, sats 100% Last Pain:  Vitals:   06/25/17 0551  TempSrc: Oral      Patients Stated Pain Goal: 3 (49/70/26 3785)  Complications: No apparent anesthesia complications

## 2017-06-25 NOTE — Progress Notes (Signed)
Patient ID: Todd Frank, male   DOB: [birth year], 62 y.o.   MRN: [id number]  TCTS Evening Rounds:   Hemodynamically stable  CI = 2.68 on milrinone 0.25  Has started to wake up on vent.   Urine output good  CT output low  CBC    Component Value Date/Time   WBC 8.9 06/25/2017 1536   RBC 3.67 (L) 06/25/2017 1536   HGB 11.1 (L) 06/25/2017 1536   HGB 15.3 02/10/2017 0836   HCT 32.5 (L) 06/25/2017 1536   HCT 45.2 02/10/2017 0836   PLT 87 (L) 06/25/2017 1536   PLT 133 (L) 02/10/2017 0836   MCV 88.6 06/25/2017 1536   MCV 91 02/10/2017 0836   MCH 30.2 06/25/2017 1536   MCHC 34.2 06/25/2017 1536   RDW 13.0 06/25/2017 1536   RDW 14.1 02/10/2017 0836   LYMPHSABS 1.3 02/10/2017 0836   MONOABS 500 02/15/2016 1517   EOSABS 0.1 02/10/2017 0836   BASOSABS 0.1 02/10/2017 0836     BMET    Component Value Date/Time   NA 145 06/25/2017 1531   NA 142 02/10/2017 0836   K 3.5 06/25/2017 1531   CL 106 06/25/2017 1400   CO2 19 (L) 06/23/2017 1048   GLUCOSE 94 06/25/2017 1531   BUN 8 06/25/2017 1400   BUN 13 02/10/2017 0836   CREATININE 0.60 (L) 06/25/2017 1400   CREATININE 0.84 02/15/2016 1517   CALCIUM 9.2 06/23/2017 1048   GFRNONAA >60 06/23/2017 1048   GFRNONAA 94 11/02/2015   GFRAA >60 06/23/2017 1048     A/P:  Stable postop course. Continue current plans

## 2017-06-25 NOTE — Progress Notes (Signed)
PHARMACIST - PHYSICIAN ORDER COMMUNICATION  CONCERNING: P&T Medication Policy on Herbal Medications  DESCRIPTION:  This patient's order for:  CoQ-10 200 mg  has been noted.  This product(s) is classified as an "herbal" or natural product. Due to a lack of definitive safety studies or FDA approval, nonstandard manufacturing practices, plus the potential risk of unknown drug-drug interactions while on inpatient medications, the Pharmacy and Therapeutics Committee does not permit the use of "herbal" or natural products of this type within [facility].   ACTION TAKEN: The pharmacy department is unable to verify this order at this time and your patient has been informed of this safety policy. Please reevaluate patient's clinical condition at discharge and address if the herbal or natural product(s) should be resumed at that time.

## 2017-06-25 NOTE — H&P (Signed)
PCP is Olin Hauser, DO Referring Provider is No ref. provider found  No chief complaint on file.   HPI: Patient examined, cardiac catheterization and echocardiogram recently performed personally reviewed and discussed with patient and wife.  62 year old nondiabetic non-smoker employed male presents with diagnosis of severe bicuspid aortic stenosis, moderately dilated ascending aorta, with 2 vessel coronary artery disease, and recent syncopal episode while he was jogging and had documented heart rate up to 170.  He sustained a fall to the forehead and was hospitalized and observed at [facility].  Head scans were unremarkable.  He recovered quickly and underwent left and right heart cath and a repeat echocardiogram.  The echocardiogram showed an increased gradient of his bicuspid aortic valve since his last study with a peak gradient of 75 mmHg and mean gradient of 44 mmHg.  Calculated area 0.7.  LV function was with normal EF, LVH was present no other valvular abnormalities.  Patient had a cath which showed stable moderate disease with a 80-90% stenosis of his LAD and 80% stenosis of a distal posterior descending lesion.  The patient had undergone catheterization in 2017 after an abnormal stress test was performed prior to back surgery.  His coronary disease has been treated medically and is been asymptomatic.  Patient recently underwent dental exam and care including root canal of a left molar with a crown being placed by his dentist.  He was treated with antibiotics during those procedures.  Since being discharged from the hospital after his syncopal event and head trauma he has been told not to drive.  He has returned to work.  He is not engaged in any sort of exercise training.  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  Patient has positive family history  of aortic stenosis in his 62 year old mother.  Patient had a CT scan over 2 years ago which demonstrated a moderate ascending aortic dilatation 4.2 cm.  Patient denies any  Childhood illnesses suggestive of rheumatic fever or scarlet fever and has been very physically active his entire life.  We talked about choice of valve prosthesis and he demonstrated preference for a biologic valve which would not commit him to lifelong Coumadin therapy.  Patient was also noted to have a patent foramen ovale on his echocardiogram study.  He is in sinus rhythm.  Past Medical History:  Diagnosis Date  . Aortic stenosis    a. mod-severe by echo 02/2016 but moderate by cath 02/2016  . Arthritis   . Bicuspid aortic valve   . Bradycardia 07/23/2015  . Broken clavicle    as child, can still dislocate at times  . CAD (coronary artery disease), native coronary artery    cath 02/2016 showing  Normal LVF, moderate AS, occluded distal RCA supplied by LCx collaterals, 40% prox RCA, 80% acute marginal, 60% OM3, 90% mid to distal LAD (does not reach the apex and small), 60% D1, 50% OM1, 60% mid LAD, 70% PDA - medical management was felt to be best option.    . Carotid bruit    carotid dopplers negative.  Bruit due to heart murmur from AS  . Congenital nevus   . Diastolic dysfunction   . Dilated aortic root ([place])    7mm by echo 02/2016  . GERD (gastroesophageal reflux disease)   . Headache   . HTN (hypertension)   . Hyperlipidemia  LDL goal < 70  . LVH (left ventricular hypertrophy)   . Melanoma of skin ([place])   . Obesity   . PFO (patent foramen ovale)    small by echo 2014    Past Surgical History:  Procedure Laterality Date  . arthritic cyst removal  2017   from Lumbar 4-5, dr pool  . CARDIAC CATHETERIZATION N/A 02/20/2016   Procedure: Right/Left Heart Cath and Coronary Angiography;  Surgeon: Belva Crome, MD;  Location: [facility];  Service: Cardiovascular;  Laterality: N/A;  .  CORONARY/GRAFT ANGIOGRAPHY N/A 06/02/2017   Procedure: CORONARY/GRAFT ANGIOGRAPHY;  Surgeon: Nelva Bush, MD;  Location: [place];  Service: Cardiovascular;  Laterality: N/A;  . EYE SURGERY    . MELANOMA EXCISION  2011   left thigh  . RIGHT HEART CATH N/A 06/02/2017   Procedure: RIGHT HEART CATH;  Surgeon: Nelva Bush, MD;  Location: [facility] [facility];  Service: Cardiovascular;  Laterality: N/A;  . TONSILLECTOMY      Family History  Problem Relation Age of Onset  . Diabetes Mother   . Arthritis Father   . Heart disease Paternal Grandmother        MI  . Heart attack Unknown        family history  . Pancreatic cancer Maternal Aunt     Social History Social History   Tobacco Use  . Smoking status: Former Research scientist (life sciences)  . Smokeless tobacco: Never Used  . Tobacco comment: quit in 78, teenager  Substance Use Topics  . Alcohol use: Yes    Alcohol/week: 0.6 oz    Types: 1 Cans of beer per week    Comment: occasional  . Drug use: No    Current Facility-Administered Medications  Medication Dose Route Frequency Provider Last Rate Last Dose  . cefUROXime (ZINACEF) 1.5 g in sodium chloride 0.9 % 100 mL IVPB  1.5 g Intravenous To OR Prescott Gum, Collier Salina, MD      . cefUROXime (ZINACEF) 750 mg in sodium chloride 0.9 % 100 mL IVPB  750 mg Intravenous To OR Prescott Gum, Collier Salina, MD      . chlorhexidine (HIBICLENS) 4 % liquid 2 application  30 mL Topical UD Prescott Gum, Collier Salina, MD      . dexmedetomidine (PRECEDEX) 400 MCG/100ML (4 mcg/mL) infusion  0.1-0.7 mcg/kg/hr Intravenous To OR Prescott Gum, Collier Salina, MD      . DOPamine (INTROPIN) 800 mg in dextrose 5 % 250 mL (3.2 mg/mL) infusion  0-10 mcg/kg/min Intravenous To OR Prescott Gum, Collier Salina, MD      . EPINEPHrine (ADRENALIN) 4 mg in dextrose 5 % 250 mL (0.016 mg/mL) infusion  0-10 mcg/min Intravenous To OR Prescott Gum, Collier Salina, MD      . heparin 2,500 Units, papaverine 30 mg in electrolyte-148 (PLASMALYTE-148) 500 mL irrigation   Irrigation To OR  Prescott Gum, Collier Salina, MD      . heparin 2,500 Units, papaverine 30 mg in electrolyte-148 (PLASMALYTE-148) 500 mL irrigation   Irrigation To OR Prescott Gum, Collier Salina, MD      . heparin 30,000 units/NS 1000 mL solution for CELLSAVER   Other To OR Prescott Gum, Collier Salina, MD      . insulin regular (NOVOLIN R,HUMULIN R) 100 Units in sodium chloride 0.9 % 100 mL (1 Units/mL) infusion   Intravenous To OR Prescott Gum, Collier Salina, MD      . magnesium sulfate (IV Push/IM) injection 40 mEq  40 mEq Other To OR Ivin Poot, MD      .  milrinone (PRIMACOR) 20 MG/100 ML (0.2 mg/mL) infusion  0.125 mcg/kg/min Intravenous Continuous Prescott Gum, Collier Salina, MD      . nitroGLYCERIN 50 mg in dextrose 5 % 250 mL (0.2 mg/mL) infusion  2-200 mcg/min Intravenous To OR Prescott Gum, Collier Salina, MD      . phenylephrine (NEO-SYNEPHRINE) 20 mg in sodium chloride 0.9 % 250 mL (0.08 mg/mL) infusion  30-200 mcg/min Intravenous To OR Prescott Gum, Collier Salina, MD      . potassium chloride injection 80 mEq  80 mEq Other To OR Prescott Gum, Collier Salina, MD      . tranexamic acid (CYKLOKAPRON) 2,500 mg in sodium chloride 0.9 % 250 mL (10 mg/mL) infusion  1.5 mg/kg/hr Intravenous To OR Prescott Gum, Collier Salina, MD      . tranexamic acid (CYKLOKAPRON) bolus via infusion - over 30 minutes 1,353 mg  15 mg/kg Intravenous To OR Prescott Gum, Collier Salina, MD      . tranexamic acid (CYKLOKAPRON) pump prime solution 180 mg  2 mg/kg Intracatheter To OR Prescott Gum, Collier Salina, MD      . vancomycin (VANCOCIN) 1,500 mg in sodium chloride 0.9 % 250 mL IVPB  1,500 mg Intravenous To OR Prescott Gum, Collier Salina, MD      . vancomycin (VANCOCIN) 1,500 mg in sodium chloride 0.9 % 500 mL IVPB  1,500 mg Intravenous Once Ivin Poot, MD       Facility-Administered Medications Ordered in Other Encounters  Medication Dose Route Frequency Provider Last Rate Last Dose  . fentaNYL (SUBLIMAZE) injection    Anesthesia Intra-op Wilburn Cornelia, CRNA   50 mcg at 06/25/17 [phone]  . midazolam (VERSED) injection    Anesthesia Intra-op Wilburn Cornelia, CRNA   0.5 mg at 06/25/17 0707    No Known Allergies        Review of Systems :  [ y ] = yes, [  ] = no        General :  Weight gain [   ]    Weight loss  [   ]  Fatigue [  ]  Fever [  ]  Chills  [  ]                                Weakness  [  ]           HEENT    Headache [  ]  Dizziness [  ]  Blurred vision [  ] Glaucoma  [  ]                          Nosebleeds [  ] Painful or loose teeth [recent left molar root canal and crown]        Cardiac :  Chest pain/ pressure [  ]  Resting SOB [  ] exertional SOB [  ]                        Orthopnea [  ]  Pedal edema  [  ]  Palpitations [  ] Syncope/presyncope [yes]                        Paroxysmal nocturnal dyspnea [  ]         Pulmonary : cough [  ]  wheezing [  ]  Hemoptysis [  ]  Sputum [  ] Snoring [  ]                              Pneumothorax [  ]  Sleep apnea [  ]        GI : Vomiting [  ]  Dysphagia [  ]  Melena  [  ]  Abdominal pain [  ] BRBPR [  ]              Heart burn [  ]  Constipation [  ] Diarrhea  [  ] Colonoscopy [   ]        GU : Hematuria [  ]  Dysuria [  ]  Nocturia [  ] UTI's [  ]        Vascular : Claudication [  ]  Rest pain [  ]  DVT [  ] Vein stripping [  ] leg ulcers [  ]                          TIA [  ] Stroke [  ]  Varicose veins [  ]        NEURO :  Headaches  [  ] Seizures [  ] Vision changes [prior extraocular muscle surgery in childhood] Paresthesias [  ]   right-hand-dominant                                    seizures [  ]        Musculoskeletal :  Arthritis [  ] Gout  [  ]  Back pain [  ]  Joint pain [  ]        Skin :  Rash [  ]  Melanoma [yes resected from left thigh with wide local excision] Sores [  ]        Heme : Bleeding problems [  ]Clotting Disorders [  ] Anemia [  ]Blood Transfusion [ ]         Endocrine : Diabetes [  ] Heat or Cold intolerance [  ] Polyuria [  ]excessive thirst [ ]         Psych : Depression [  ]  Anxiety [  ]  Psych hospitalizations [  ] Memory change [   ]                                               BP (!) 149/84   Pulse 63   Temp 98.4 F (36.9 C) (Oral)   Resp 18   Ht 5' 9.5" (1.765 m)   Wt 198 lb (89.8 kg)   SpO2 99%   BMI 28.82 kg/m  Physical Exam     Physical Exam  General: Well-nourished middle-aged Caucasian male accompanied by wife in no acute distress HEENT: Normocephalic pupils equal , dentition adequate Neck: Supple without JVD, adenopathy, or bruit Chest: Clear to auscultation, symmetrical breath sounds, no rhonchi, no tenderness             or deformity Cardiovascular: Regular rate and rhythm, + murmur--3/6 AS  SEM , no gallop, peripheral pulses  palpable in all extremities Abdomen:  Soft, nontender, no palpable mass or organomegaly Extremities: Warm, well-perfused, no clubbing cyanosis edema or tenderness,              no venous stasis changes of the legs Rectal/GU: Deferred Neuro: Grossly non--focal and symmetrical throughout Skin: Clean and dry without rash or ulceration   Diagnostic Tests: Cardiac catheterization with 2 vessel CAD, normal right heart pressures and cardiac output Echocardiogram with LVH and severe bicuspid aortic stenosis CTA shows mild ascending aortic dilatation stable at 4.3 cm Impression:  Severe AS with 2 v CAD PFO Ascending aorta not sig enlarged  PLAN- AVR CABG and possible PFO closure today Patient ready for surgery [facility] [phone]

## 2017-06-25 NOTE — OR Nursing (Signed)
14:00 - 45 minute call to SICU charge nurse 14:45 - 20 minute call to SICU charge nurse 

## 2017-06-25 NOTE — Procedures (Signed)
Extubation Procedure Note  Patient Details:   Name: Todd Frank DOB: [birth year] MRN: [id number]   Airway Documentation:     Evaluation  O2 sats: stable throughout Complications: No apparent complications Patient did tolerate procedure well. Bilateral Breath Sounds: Clear   Yes   Patient performed NIF -24 and VC 1.1 L; patient had a positive cuff leak.  Patient was extubated to 4 L [place] and performed an IS of 1250 mL.  Carrington Clamp A 06/25/2017, 9:26 PM

## 2017-06-25 NOTE — Brief Op Note (Addendum)
06/25/2017  1:02 PM  PATIENT:  Todd Frank  62 y.o. male  PRE-OPERATIVE DIAGNOSIS:  1. SEVERE AS 2. CAD  POST-OPERATIVE DIAGNOSIS:  1. SEVERE AS 2.CAD  PROCEDURE: TRANSESOPHAGEAL ECHOCARDIOGRAM (TEE),  MEDIAN STERNOTOMY for AORTIC VALVE REPLACEMENT (AVR) using Magna Ease Aortic Valve ( size 60mm, Model# [id number], Serial # [id number]), CORONARY ARTERY BYPASS GRAFTING (CABG) x 2 (LIMA to LAD and SVG to PDA) using left internal mammary artery and right leg greater saphenous vein harvested endoscopically   SURGEON:  Surgeon(s) and Role:    Ivin Poot, MD - Primary  PHYSICIAN ASSISTANT: Lars Pinks PA-C  ASSISTANTS: Dineen Kid RNFA   ANESTHESIA:   general  BLOOD ADMINISTERED:Two FFP  DRAINS: Chest tubes placed in the placed mediastinal and pleural spaces  EBL   500 ml  SPECIMEN:  Source of Specimen:  Native AV leaflets  DISPOSITION OF SPECIMEN:  PATHOLOGY  COUNTS CORRECT:  YES  DICTATION: .Dragon Dictation  PLAN OF CARE: Admit to inpatient   PATIENT DISPOSITION:  ICU - intubated and hemodynamically stable.   Delay start of Pharmacological VTE agent (>24hrs) due to surgical blood loss or risk of bleeding: yes  BASELINE WEIGHT: 89.8 kg

## 2017-06-25 NOTE — Anesthesia Procedure Notes (Signed)
Central Venous Catheter Insertion Performed by: Catalina Gravel, MD, anesthesiologist Start/End3/21/2019 6:35 AM, 06/25/2017 6:45 AM Patient location: Pre-op. Preanesthetic checklist: patient identified, IV checked, site marked, risks and benefits discussed, surgical consent, monitors and equipment checked, pre-op evaluation, timeout performed and anesthesia consent Position: Trendelenburg Lidocaine 1% used for infiltration and patient sedated Hand hygiene performed , maximum sterile barriers used  and Seldinger technique used Catheter size: 8.5 Fr Total catheter length 10. Central line was placed.Sheath introducer Swan type:thermodilution PA Cath depth:50 Procedure performed using ultrasound guided technique. Ultrasound Notes:anatomy identified, needle tip was noted to be adjacent to the nerve/plexus identified, no ultrasound evidence of intravascular and/or intraneural injection and image(s) printed for medical record Attempts: 1 Following insertion, line sutured, dressing applied and Biopatch. Post procedure assessment: blood return through all ports, free fluid flow and no air  Patient tolerated the procedure well with no immediate complications.

## 2017-06-25 NOTE — Anesthesia Procedure Notes (Signed)
Arterial Line Insertion Start/End3/21/2019 6:50 AM, 06/25/2017 7:10 AM Performed by: Josephine Igo, CRNA, CRNA  Preanesthetic checklist: patient identified, IV checked, site marked, risks and benefits discussed, surgical consent, monitors and equipment checked, pre-op evaluation and timeout performed Lidocaine 1% used for infiltration and patient sedated Right, radial was placed Catheter size: 20 G Hand hygiene performed  and maximum sterile barriers used  Allen's test indicative of satisfactory collateral circulation Attempts: 2 Procedure performed without using ultrasound guided technique. Following insertion, Biopatch and dressing applied. Post procedure assessment: normal  Patient tolerated the procedure well with no immediate complications.

## 2017-06-25 NOTE — Anesthesia Procedure Notes (Signed)
Procedure Name: Intubation Date/Time: 06/25/2017 7:53 AM Performed by: Wilburn Cornelia, CRNA Pre-anesthesia Checklist: Patient identified, Emergency Drugs available, Suction available, Patient being monitored and Timeout performed Patient Re-evaluated:Patient Re-evaluated prior to induction Oxygen Delivery Method: Circle system utilized Preoxygenation: Pre-oxygenation with 100% oxygen Induction Type: IV induction Ventilation: Oral airway inserted - appropriate to patient size and Mask ventilation without difficulty Laryngoscope Size: Mac and 4 Grade View: Grade II Tube type: Oral Tube size: 8.0 mm Number of attempts: 1 Airway Equipment and Method: Stylet Placement Confirmation: ETT inserted through vocal cords under direct vision,  positive ETCO2,  CO2 detector and breath sounds checked- equal and bilateral Secured at: 23 cm Tube secured with: Tape Dental Injury: Teeth and Oropharynx as per pre-operative assessment

## 2017-06-25 NOTE — Progress Notes (Signed)
Pre Procedure note for inpatients:   Todd Frank has been scheduled for Procedure(s): AORTIC VALVE REPLACEMENT (AVR) (N/A) CORONARY ARTERY BYPASS GRAFTING (CABG) (N/A) TRANSESOPHAGEAL ECHOCARDIOGRAM (TEE) (N/A) today. The various methods of treatment have been discussed with the patient. After consideration of the risks, benefits and treatment options the patient has consented to the planned procedure.   The patient has been seen and labs reviewed. There are no changes in the patient's condition to prevent proceeding with the planned procedure today.  Recent labs:  Lab Results  Component Value Date   WBC 5.2 06/23/2017   HGB 16.2 06/23/2017   HCT 46.6 06/23/2017   PLT 119 (L) 06/23/2017   GLUCOSE 90 06/23/2017   CHOL 118 02/10/2017   TRIG 55 02/10/2017   HDL 63 02/10/2017   LDLCALC 44 02/10/2017   ALT 27 06/23/2017   AST 27 06/23/2017   NA 138 06/23/2017   K 4.1 06/23/2017   CL 108 06/23/2017   CREATININE 0.71 06/23/2017   BUN 12 06/23/2017   CO2 19 (L) 06/23/2017   TSH 1.226 05/31/2017   INR 1.11 06/23/2017   HGBA1C 4.9 06/23/2017    Len Childs, MD 06/25/2017 7:42 AM

## 2017-06-25 NOTE — Progress Notes (Signed)
  Echocardiogram Echocardiogram Transesophageal has been performed.  Todd Frank 06/25/2017, 8:26 AM

## 2017-06-25 NOTE — Plan of Care (Signed)
  Problem: Pain Managment: Goal: General experience of comfort will improve Outcome: Progressing   Problem: Skin Integrity: Goal: Risk for impaired skin integrity will decrease Outcome: Progressing   Problem: Education: Goal: Ability to demonstrate proper wound care will improve Outcome: Progressing   Problem: Cardiac: Goal: Hemodynamic stability will improve Outcome: Progressing

## 2017-06-25 NOTE — Anesthesia Procedure Notes (Signed)
Central Venous Catheter Insertion Performed by: Catalina Gravel, MD, anesthesiologist Start/End3/21/2019 6:45 AM, 06/25/2017 6:50 AM Patient location: Pre-op. Preanesthetic checklist: patient identified, IV checked, site marked, risks and benefits discussed, surgical consent, monitors and equipment checked, pre-op evaluation, timeout performed and anesthesia consent Hand hygiene performed  and maximum sterile barriers used  Total catheter length 48. PA cath was placed.Swan type:thermodilution PA Cath depth:100 Procedure performed without using ultrasound guided technique. Attempts: 1 Patient tolerated the procedure well with no immediate complications.

## 2017-06-26 ENCOUNTER — Inpatient Hospital Stay (HOSPITAL_COMMUNITY): Payer: Managed Care, Other (non HMO)

## 2017-06-26 ENCOUNTER — Encounter (HOSPITAL_COMMUNITY): Payer: Self-pay | Admitting: Cardiothoracic Surgery

## 2017-06-26 LAB — BASIC METABOLIC PANEL
Anion gap: 8 (ref 5–15)
BUN: 11 mg/dL (ref 6–20)
CHLORIDE: 107 mmol/L (ref 101–111)
CO2: 22 mmol/L (ref 22–32)
CREATININE: 0.76 mg/dL (ref 0.61–1.24)
Calcium: 8 mg/dL — ABNORMAL LOW (ref 8.9–10.3)
GFR calc Af Amer: >60 mL/min (ref >60)
GFR calc non Af Amer: >60 mL/min (ref >60)
Glucose, Bld: 136 mg/dL — ABNORMAL HIGH (ref 65–99)
POTASSIUM: 3.7 mmol/L (ref 3.5–5.1)
SODIUM: 137 mmol/L (ref 135–145)

## 2017-06-26 LAB — BPAM FFP
Blood Product Expiration Date: 201903212359
Blood Product Expiration Date: 201903212359
ISSUE DATE / TIME: 201903211313
ISSUE DATE / TIME: 201903211313
Unit Type and Rh: 6200
Unit Type and Rh: 6200

## 2017-06-26 LAB — GLUCOSE, CAPILLARY
Glucose-Capillary: 101 mg/dL — ABNORMAL HIGH (ref 65–99)
Glucose-Capillary: 109 mg/dL — ABNORMAL HIGH (ref 65–99)
Glucose-Capillary: 119 mg/dL — ABNORMAL HIGH (ref 65–99)
Glucose-Capillary: 125 mg/dL — ABNORMAL HIGH (ref 65–99)
Glucose-Capillary: 126 mg/dL — ABNORMAL HIGH (ref 65–99)
Glucose-Capillary: 127 mg/dL — ABNORMAL HIGH (ref 65–99)
Glucose-Capillary: 128 mg/dL — ABNORMAL HIGH (ref 65–99)
Glucose-Capillary: 130 mg/dL — ABNORMAL HIGH (ref 65–99)
Glucose-Capillary: 131 mg/dL — ABNORMAL HIGH (ref 65–99)
Glucose-Capillary: 136 mg/dL — ABNORMAL HIGH (ref 65–99)
Glucose-Capillary: 138 mg/dL — ABNORMAL HIGH (ref 65–99)
Glucose-Capillary: 139 mg/dL — ABNORMAL HIGH (ref 65–99)
Glucose-Capillary: 143 mg/dL — ABNORMAL HIGH (ref 65–99)
Glucose-Capillary: 145 mg/dL — ABNORMAL HIGH (ref 65–99)
Glucose-Capillary: 92 mg/dL (ref 65–99)
Glucose-Capillary: 97 mg/dL (ref 65–99)

## 2017-06-26 LAB — CBC
HCT: 30.5 % — ABNORMAL LOW (ref 39.0–52.0)
HEMATOCRIT: 30.5 % — AB (ref 39.0–52.0)
HEMOGLOBIN: 10.5 g/dL — AB (ref 13.0–17.0)
HEMOGLOBIN: 10.5 g/dL — AB (ref 13.0–17.0)
MCH: 30.3 pg (ref 26.0–34.0)
MCH: 30.4 pg (ref 26.0–34.0)
MCHC: 34.4 g/dL (ref 30.0–36.0)
MCHC: 34.4 g/dL (ref 30.0–36.0)
MCV: 87.9 fL (ref 78.0–100.0)
MCV: 88.4 fL (ref 78.0–100.0)
Platelets: 86 10*3/uL — ABNORMAL LOW (ref 150–400)
Platelets: 75 10*3/uL — ABNORMAL LOW (ref 150–400)
RBC: 3.47 MIL/uL — ABNORMAL LOW (ref 4.22–5.81)
RBC: 3.45 MIL/uL — AB (ref 4.22–5.81)
RDW: 13 % (ref 11.5–15.5)
RDW: 13.2 % (ref 11.5–15.5)
WBC: 7.5 10*3/uL (ref 4.0–10.5)
WBC: 8.3 10*3/uL (ref 4.0–10.5)

## 2017-06-26 LAB — POCT I-STAT, CHEM 8
BUN: 16 mg/dL (ref 6–20)
Calcium, Ion: 1.23 mmol/L (ref 1.15–1.40)
Chloride: 102 mmol/L (ref 101–111)
Creatinine, Ser: 0.7 mg/dL (ref 0.61–1.24)
Glucose, Bld: 139 mg/dL — ABNORMAL HIGH (ref 65–99)
HCT: 30 % — ABNORMAL LOW (ref 39.0–52.0)
Hemoglobin: 10.2 g/dL — ABNORMAL LOW (ref 13.0–17.0)
Potassium: 3.6 mmol/L (ref 3.5–5.1)
Sodium: 139 mmol/L (ref 135–145)
TCO2: 23 mmol/L (ref 22–32)

## 2017-06-26 LAB — CREATININE, SERUM: Creatinine, Ser: 0.8 mg/dL (ref 0.61–1.24)

## 2017-06-26 LAB — PREPARE FRESH FROZEN PLASMA
Unit division: 0
Unit division: 0

## 2017-06-26 LAB — PREPARE PLATELET PHERESIS: Unit division: 0

## 2017-06-26 LAB — BPAM PLATELET PHERESIS
Blood Product Expiration Date: 201903212359
ISSUE DATE / TIME: 201903211312
Unit Type and Rh: 6200

## 2017-06-26 LAB — TYPE AND SCREEN
ABO/RH(D): O POS
Antibody Screen: NEGATIVE

## 2017-06-26 LAB — MAGNESIUM
MAGNESIUM: 2.2 mg/dL (ref 1.7–2.4)
MAGNESIUM: 2.3 mg/dL (ref 1.7–2.4)

## 2017-06-26 MED ORDER — KETOROLAC TROMETHAMINE 15 MG/ML IJ SOLN
15.0000 mg | Freq: Four times a day (QID) | INTRAMUSCULAR | Status: AC
  Administered 2017-06-26 (×4): 15 mg via INTRAVENOUS
  Filled 2017-06-26 (×4): qty 1

## 2017-06-26 MED ORDER — POTASSIUM CHLORIDE 10 MEQ/50ML IV SOLN
10.0000 meq | INTRAVENOUS | Status: AC | PRN
  Administered 2017-06-26 (×3): 10 meq via INTRAVENOUS
  Filled 2017-06-26: qty 50

## 2017-06-26 MED ORDER — POTASSIUM CHLORIDE CRYS ER 20 MEQ PO TBCR
20.0000 meq | EXTENDED_RELEASE_TABLET | Freq: Every day | ORAL | Status: DC
  Administered 2017-06-26: 20 meq via ORAL
  Filled 2017-06-26: qty 1

## 2017-06-26 MED ORDER — INSULIN ASPART 100 UNIT/ML ~~LOC~~ SOLN
0.0000 [IU] | SUBCUTANEOUS | Status: DC
  Administered 2017-06-26: 2 [IU] via SUBCUTANEOUS

## 2017-06-26 MED ORDER — ASPIRIN EC 81 MG PO TBEC
81.0000 mg | DELAYED_RELEASE_TABLET | Freq: Every day | ORAL | Status: DC
  Administered 2017-06-27 – 2017-06-30 (×4): 81 mg via ORAL
  Filled 2017-06-26 (×5): qty 1

## 2017-06-26 MED ORDER — POTASSIUM CHLORIDE 10 MEQ/50ML IV SOLN
10.0000 meq | INTRAVENOUS | Status: AC
  Administered 2017-06-26 (×3): 10 meq via INTRAVENOUS
  Filled 2017-06-26 (×2): qty 50

## 2017-06-26 MED ORDER — FUROSEMIDE 10 MG/ML IJ SOLN
20.0000 mg | Freq: Two times a day (BID) | INTRAMUSCULAR | Status: DC
  Administered 2017-06-26 – 2017-06-27 (×3): 20 mg via INTRAVENOUS
  Filled 2017-06-26 (×3): qty 2

## 2017-06-26 MED ORDER — MILRINONE LACTATE IN DEXTROSE 20-5 MG/100ML-% IV SOLN
0.1250 ug/kg/min | INTRAVENOUS | Status: DC
  Administered 2017-06-26: 0.125 ug/kg/min via INTRAVENOUS
  Filled 2017-06-26: qty 100

## 2017-06-26 NOTE — Op Note (Signed)
NAMECORDARRIUS, COAD NO.:  [id number]  MEDICAL RECORD NO.:  [id number]  LOCATION:  [facility]                         FACILITY:  [facility]  PHYSICIAN:  Ivin Poot, M.D.  DATE OF BIRTH:  September 17, 1955  DATE OF PROCEDURE:  06/25/2017 DATE OF DISCHARGE:                              OPERATIVE REPORT   OPERATIONS: 1. Aortic valve replaced with a 23-mm pericardial [facility] Ease     valve. 2. Coronary artery bypass grafting x2 (left internal mammary artery to     LAD, saphenous vein graft to distal posterior descending). 3. Endoscopic harvest of right leg greater saphenous vein.  PREOPERATIVE DIAGNOSES:  Severe aortic stenosis with syncope, severe 2- vessel coronary artery disease with previous non-ST-elevation myocardial infarction.  POSTOPERATIVE DIAGNOSES:  Severe aortic stenosis with syncope, severe 2- vessel coronary artery disease with previous non-ST-elevation myocardial infarction.  SURGEON:  Ivin Poot, MD.  ASSISTANT:  Lars Pinks, PA-C.  ANESTHESIA:  General by Dr. Nolon Nations.  CLINICAL NOTE:  The patient is a 62 year old male with history of cardiac murmur, followed for aortic stenosis with serial echocardiograms by his cardiologist.  Despite moderate aortic stenosis, he was without symptoms and had a very active athletic schedule.  However, during an episode of running and with documented heart rate by his Fitbit of over 170 beats per minute.  He had a syncopal episode with a fall and superficial head injury and was admitted to [facility]. He did not require CPR and he was hemodynamically stable.  CT scan of the head was unremarkable.  Echocardiogram showed severe aortic stenosis with a peak gradient of over 70 mmHg.  He had LVH and some mild to moderate AI as well.  The patient underwent left and right heart catheterization, which demonstrated severe 2-vessel coronary artery disease with a high-grade 90% stenosis of  the LAD and a high-grade 80- 90% stenosis of the distal posterior descending.  He had a bicuspid aortic valve.  I saw the patient in consultation after his discharge from the hospital for aortic valve replacement and agreed with the recommendation of his cardiologist for AVR with combined CABG.  Prior to surgery, the patient underwent a CT scan showing no significant dilatation of the ascending aorta with a diameter 4.2 cm.  Prior to surgery, I had seen the patient in the office and discussed the details of combined AVR and CABG in detail including the indications, benefits, alternatives, and risks.  He understood the risks to include stroke, bleeding, blood transfusion, pacemaker requirement, postoperative pulmonary problems including pleural effusion, postoperative infection, postoperative organ failure, and death.  He agreed to proceed with surgery under what I felt was an informed consent.  We discussed the type of aortic valve to choose and his preference was a biologic valve to avoid lifelong anticoagulation due to his active athletic lifestyle with running and going to the fitness center.  DESCRIPTION OF PROCEDURE:  The patient was brought from the preop area to the operating room and placed supine on the operating room table where general anesthesia was induced.  The transesophageal echo probe was placed by the anesthesiologist, which confirmed the preoperative diagnosis of severe  AS, LVH.  There was no significant mitral valve disease.  There was no evidence of patent foramen ovale with close interrogation including bubble study.  The patient was prepped and draped as a sterile field.  A proper time- out was performed.  A sternal incision was made as the saphenous vein was harvested from the right leg.  The internal mammary artery was harvested as a pedicle graft from its origin at the subclavian vessels. Upon initiation of surgery, it was apparent the patient had  a coagulation deficit despite fairly normal preoperative coagulation panel, but with a mild thrombocytopenia with a platelet count of 120,000.  His bleeding diathesis slowed progress of the surgery in order to maintain hemostasis during both the pre-pump and post-pump portion of the operation.  The mammary artery was harvested and it was good vessel with excellent flow.  The vein was harvested and it was a good vessel for conduit.  The sternal retractor was placed and the pericardium was opened and suspended.  The aorta was inspected and it was mildly dilated, but without calcification or significant disease.  It was somewhat thin. The patient was cannulated after heparin was administered through pursestrings in the ascending aorta and right atrium and placed on cardiopulmonary bypass.  A left ventricular vent was placed via the right superior pulmonary vein.  The coronaries were identified for grafting and cardioplegia cannulas were placed both antegrade and retrograde cold blood cardioplegia.  The mammary artery and vein grafts were prepared for the distal anastomoses, and the patient was cooled to 32 degrees.  An aortic crossclamp was applied and 1 L of cold blood cardioplegia was delivered in split doses between the antegrade aortic and retrograde coronary sinus catheters.  There was good cardioplegic arrest and septal temperature dropped less than 12 degrees. Cardioplegia was delivered every 20 minutes.  First, the distal anastomoses were performed.  The posterior descending was a 1.5- to- 1.7-mm vessel with a high-grade 80% stenosis in its mid portion.  A reversed saphenous vein was sewn distal to the blockage with a running 7-0 Prolene with good flow through the graft.  Cardioplegia was redosed.  The second distal anastomosis was to the distal LAD.  It had diffuse disease, heavily calcified proximally.  The left IMA pedicle was brought through an opening in the left lateral  pericardium and it was brought down onto the LAD and sewn end-to-side with running 8-0 Prolene.  There was good flow through the anastomosis after briefly releasing the pedicle bulldog on the mammary artery.  The bulldog was reapplied to the epicardium and cardioplegia was redosed.  Attention then was directed to the aortic valve.  A transverse aortotomy was made.  The aortic valve was inspected.  It was bicuspid, heavily calcified, and immobile.  It was excised and the anulus debrided of calcium.  The outflow tract was irrigated.  The annulus was sized to a 23-mm Magna Ease valve.  Subannular 2-0 Ethibond pledgeted sutures were placed around the anulus, numbering 15 total.  The valve was prepared according to the protocol and the sutures were placed through the sewing ring and the valve was seated and sutures were tied.  The valve conformed to the annulus well without space for paravalvular leak and there was no obstruction of the coronary ostia.  The aorta was closed with running 4-0 Prolene in 2 layers.  After cardioplegia was redosed, the proximal vein anastomosis was performed on the ascending aorta using a 4.5-mm punch and running 6-0 Prolene.  Prior to tying down the anastomosis, air was vented from the coronary arteries with a dose of retrograde warm blood cardioplegia in the usual de-airing maneuvers.  CO2 had been insufflated into the field during the aortic valve replacement portion of the procedure.  The heart was cardioverted back to a regular rhythm.  There was adequate hemostasis in the aortotomy and proximal and distal anastomoses of the coronary grafts.  The cardioplegia cannula was removed and the LV vent was removed.  Temporary pacing wires were applied.  The patient was rewarmed and reperfused.  The lungs were re-expanded and the ventilator was resumed.  After the patient was adequately rewarmed and reperfused, he was weaned from cardiopulmonary bypass on low-dose  milrinone without difficulty.  Cardiac output was 5 L/min.  Echo showed normal functioning of the bioprosthetic valve.  Protamine was administered without adverse reaction.  There was still considerable diffuse coagulopathy.  Bleeding from the sternal incision, the mediastinal dissection, and needle holes. The patient was given a transfusion of platelets for a decreased platelet count of 80,000 after coming off pump.  The patient was also given a transfusion of FFP.  The patient was also given a dose of DDAVP. These measures significantly improved coagulation function.  The superior pericardial fat was closed over the aorta.  Anterior mediastinal and left pleural chest tubes were placed and brought out through separate incisions.  The sternum was closed with wire.  The patient remained stable.  The pectoralis fascia was closed with a running #1 Vicryl.  The subcutaneous and skin layers were closed with running Vicryl.  The patient was then transported back to the ICU in stable condition.  Total cardiopulmonary bypass time was 155 minutes.     Ivin Poot, M.D.     PV/MEDQ  D:  06/25/2017  T:  06/26/2017  Job:  [id number]  cc:   Nelva Bush, M.D. [name], M.D.

## 2017-06-26 NOTE — Discharge Summary (Addendum)
Physician Discharge Summary  Patient ID: Todd Frank MRN: [id number] DOB/AGE: 63/17/1957 63 y.o.  Admit date: 06/25/2017 Discharge date: 06/30/2017  Admission Diagnoses:  Patient Active Problem List   Diagnosis Date Noted  . Coronary artery disease 06/25/2017  . Syncope 05/31/2017  . Benign paroxysmal positional vertigo 07/31/2016  . CAD (coronary artery disease), native coronary artery   . Carotid bruit   . Abnormal nuclear stress test 02/20/2016  . Chronic low back pain with right-sided sciatica 02/04/2016  . Overweight (BMI 25.0-29.9) 02/04/2016  . Prostate cancer screening 02/04/2016  . Exposure to hepatitis C 02/04/2016  . History of melanoma excision 01/09/2016  . Colon cancer screening 01/09/2016  . Bradycardia 07/23/2015  . Allergic rhinitis 05/14/2015  . ED (erectile dysfunction) 02/02/2015  . Hamstring tightness 12/02/2013  . Fasciculations of muscle 12/02/2013  . Piriformis syndrome of right side 11/01/2013  . Iliotibial band syndrome 10/11/2013  . Severe aortic stenosis 01/26/2013  . Essential hypertension 01/26/2013  . Hypercholesteremia   . LVH (left ventricular hypertrophy)   . PFO (patent foramen ovale)   . Diastolic dysfunction   . GERD (gastroesophageal reflux disease)   . Dilated aortic root [facility])    Discharge Diagnoses:   Patient Active Problem List   Diagnosis Date Noted  . S/P aortic valve replacement with bioprosthetic valve 06/25/2017  . S/P CABG x 2 06/25/2017  . Coronary artery disease 06/25/2017  . Syncope 05/31/2017  . Benign paroxysmal positional vertigo 07/31/2016  . CAD (coronary artery disease), native coronary artery   . Carotid bruit   . Abnormal nuclear stress test 02/20/2016  . Chronic low back pain with right-sided sciatica 02/04/2016  . Overweight (BMI 25.0-29.9) 02/04/2016  . Prostate cancer screening 02/04/2016  . Exposure to hepatitis C 02/04/2016  . History of melanoma excision 01/09/2016  . Colon cancer screening  01/09/2016  . Bradycardia 07/23/2015  . Allergic rhinitis 05/14/2015  . ED (erectile dysfunction) 02/02/2015  . Hamstring tightness 12/02/2013  . Fasciculations of muscle 12/02/2013  . Piriformis syndrome of right side 11/01/2013  . Iliotibial band syndrome 10/11/2013  . Severe aortic stenosis 01/26/2013  . Essential hypertension 01/26/2013  . Hypercholesteremia   . LVH (left ventricular hypertrophy)   . PFO (patent foramen ovale)   . Diastolic dysfunction   . GERD (gastroesophageal reflux disease)   . Dilated aortic root [facility])    Consultant: Cardiology  Discharged Condition: good  History of Present Illness:  Todd Frank is a 62 yo male referred to [facility] for evaluation of severe aortic stenosis, dilated descending aorta, and 2 vessel CAD.  The patient presented to [facility] after suffering a syncopal episode while jogging and states his HR was 170 prior to passing out per his watch.  He hit his forehead when he fell and was admitted for observation.  Echocardiogram was obtained and showed an increased gradient across his bicuspid aortic valve which had progressed from previous study, EF was preserved.  Cardiac catheterization was also performed and showed severe 2 vessel CAD.  It was felt he should undergo intervention on his aortic valve with coronary bypass procedure and he was referred to Todd Frank for further evaluation.  The patient was evaluated by Todd Frank on 06/11/2017 at which time the patient stated he had returned to work but was not currently exercising.  He has been active his whole life stating that he works out about 1.5 hours daily.  It was felt the patient should undergo surgical intervention for survival benefit.  The risks and benefits of the procedure were explained to the patient and he was agreeable to proceed.  Hospital Course:   Todd Frank presented to [facility] on 06/25/2017.  He was taken to the operating room and underwent CABG x 2 utilizing LIMA to  LAD, and SVG to distal PDA, Aortic Valve Replacement with a 23 mm [facility] Ease Valve, and endoscopic harvest of greater saphenous vein from his right leg.  He tolerated the procedure without difficulty and was taken to the SICU in stable condition.  He was extubated the evening of surgery.  During his stay in the SICU the patient was weaned off Milrinone as tolerated.  His chest tubes, foley, and arterial line were removed without difficulty.  He was volume overloaded and was diuresed. He had ABL anemia. His last H and H was stable at 11.1 and 32.4. He also had thrombocytopenia. His admitting platelet count was 119,000. His last platelet count was up to 134,000. He was only on a baby ecasa because of his thrombocytopenia. He was surgically stable for transfer from the ICU to 4E on 06/28/2017. He is ambulating on room air. He has been tolerating a diet and has had bowel movements.He was found to have a possible LBBB post op as well as a variation in QRS complex amplitude at times. Cardiology was consulted. He did have a transient RBBB on his first post-op EKG as well as some nonspecific T wave changes in follow-up, but in discussion with Todd Frank this was likely due to cardiac edema/changes related to immediate post-op status. He also has had some T wave inversions but appears to have NO specific etiology for mild EKG changes. An echo was been ordered. Results showed LVEF 50-55%, no pericardial effusion, no regional wall motion abnormalities. As discussed with Todd Frank, he is felt surgically stable for discharge today.  Significant Diagnostic Studies: angiography:   1. Significant 2-vessel coronary artery disease involving the LAD and distal RCA/PDA, not significantly changed since prior catheterization in 02/2016. 2. Normal left heart, right heart, and pulmonary artery pressures. 3. Normal Fick cardiac output/index. 4. Heavily calcified aortic valve with limited mobility on fluoroscopic  images.  Echocardiogram:   Septum: No Patent Foramen Ovale present.  Left atrium: Patent foramen ovale not present.  Left atrium: with late bubble crossing consistent with intrahepatic or intrapulmonary.  Aortic valve: The valve is probable trileaflet. Severe valve thickening present. Severe valve calcification present. Severely decreased leaflet separation. Severe stenosis. Mild regurgitation.  Mitral valve: No leaflet thickening and calcification present. Mild regurgitation.  Right ventricle: Normal cavity size, wall thickness and ejection fraction. No thrombus present. No mass present.  Tricuspid valve: Mild regurgitation. The tricuspid valve regurgitation jet is directed toward the right atrial free wall.  Pulmonic valve: Trace regurgitation.  Treatments: surgery:   1. Aortic valve replaced with a 23-mm pericardial [facility] Ease     valve. 2. Coronary artery bypass grafting x2 (left internal mammary artery to     LAD, saphenous vein graft to distal posterior descending). 3. Endoscopic harvest of right leg greater saphenous vein.  Disposition:  Home  Discharge medications:   Allergies as of 06/30/2017   No Known Allergies     Medication List    TAKE these medications   aspirin EC 81 MG tablet Take 81 mg by mouth daily.   atorvastatin 80 MG tablet Commonly known as:  LIPITOR Take 1 tablet (80 mg total) by mouth daily. Please make overdue  yearly appt with [name] before anymore refills. 2nd attempt   CoQ-10 200 MG Caps Take 200 mg by mouth daily.   fluticasone 50 MCG/ACT nasal spray Commonly known as:  FLONASE Place 2 sprays into both nostrils daily. What changed:    when to take this  reasons to take this   furosemide 40 MG tablet Commonly known as:  LASIX Take 1 tablet (40 mg total) by mouth daily. For 4 days then stop.   metoprolol tartrate 25 MG tablet Commonly known as:  LOPRESSOR Take 1 tablet (25 mg total) by mouth 2 (two) times  daily.   potassium chloride SA 20 MEQ tablet Commonly known as:  K-DUR,KLOR-CON Take 1 tablet (20 mEq total) by mouth daily. For 4 days then stop.   traMADol 50 MG tablet Commonly known as:  ULTRAM Take 50 mg by mouth every 4-6 hours PRN moderate to severe pain.     The patient has been discharged on:   1.Beta Blocker:  Yes [ x  ]                              No   [   ]                              If No, reason:  2.Ace Inhibitor/ARB: Yes [   ]                                     No  [  x  ]                                     If No, reason:Labile BP  3.Statin:   Yes [ x  ]                  No  [   ]                  If No, reason:  4.Ecasa:  Yes  [  x ]                  No   [   ]                  If No, reason:  Follow-up Information    Ivin Poot, MD. Go on 08/05/2017.   Specialty:  Cardiothoracic Surgery Why:  PA/LAT CXR to be taken (at [facility] which is in the same building as Dr. Lucianne Lei Trigt's office) on 08/05/2017 at 12:30 pm;Appointment time is at 1:00 pm Contact information: [place] [address] [phone]        Charlie Pitter, PA-C Follow up.   Specialties:  Cardiology, Radiology Why:  [facility] - 4/9 at 11am - arrive 15 minutes prior to appointment to check in. Lisbeth Renshaw is one of the PAs that works with Dr. Theodosia Blender care team. Contact information: [address] [place] [address] [phone]             Signed: Arnoldo Lenis 06/30/2017, 2:52 PM

## 2017-06-26 NOTE — Progress Notes (Signed)
1 Day Post-Op Procedure(s) (LRB): AORTIC VALVE REPLACEMENT (AVR) using Magna Ease Aortic Valve size 20mm (N/A) CORONARY ARTERY BYPASS GRAFTING (CABG) x Two , using left internal mammary artery and right leg greater saphenous vein harvested endoscopically (N/A) TRANSESOPHAGEAL ECHOCARDIOGRAM (TEE) (N/A) Subjective: Extubated with stable  Hemodynamics nsr 72 cxr clear Neuro intact  Objective: Vital signs in last 24 hours: Temp:  [95.5 F (35.3 C)-99.1 F (37.3 C)] 98.2 F (36.8 C) (03/22 0705) Pulse Rate:  [60-89] 82 (03/22 0705) Cardiac Rhythm: Atrial paced (03/22 0400) Resp:  [0-21] 16 (03/22 0705) BP: (91-126)/(63-84) 126/72 (03/22 0700) SpO2:  [96 %-100 %] 98 % (03/22 0705) Arterial Line BP: (70-165)/(41-72) 165/57 (03/22 0705) FiO2 (%):  [40 %-50 %] 40 % (03/21 2030) Weight:  [211 lb 6.7 oz (95.9 kg)] 211 lb 6.7 oz (95.9 kg) (03/22 0650)  Hemodynamic parameters for last 24 hours: PAP: (16-26)/(6-17) 21/12 CO:  [4.2 L/min-8.6 L/min] 8.6 L/min CI:  [2 L/min/m2-4.2 L/min/m2] 4.2 L/min/m2  Intake/Output from previous day: 03/21 0701 - 03/22 0700 In: 6521.2 [I.V.:3985.2; Blood:1286; IV Piggyback:1250] Out: 9528 [Urine:2900; Blood:1150; Chest Tube:571] Intake/Output this shift: No intake/output data recorded.       Exam    General- alert and comfortable    Neck- no JVD, no cervical adenopathy palpable, no carotid bruit   Lungs- clear without rales, wheezes   Cor- regular rate and rhythm, no murmur , gallop   Abdomen- soft, non-tender   Extremities - warm, non-tender, minimal edema   Neuro- oriented, appropriate, no focal weakness   Lab Results: Recent Labs    06/25/17 2120 06/26/17 0405  WBC 8.8 7.5  HGB 11.7* 10.5*  HCT 33.2* 30.5*  PLT 97* 86*   BMET:  Recent Labs    06/23/17 1048  06/25/17 2109 06/25/17 2120 06/26/17 0405  NA 138   < > 142  --  137  K 4.1   < > 4.3  --  3.7  CL 108   < > 106  --  107  CO2 19*  --   --   --  22  GLUCOSE 90   < >  135*  --  136*  BUN 12   < > 11  --  11  CREATININE 0.71   < > 0.60* 0.79 0.76  CALCIUM 9.2  --   --   --  8.0*   < > = values in this interval not displayed.    PT/INR:  Recent Labs    06/25/17 1536  LABPROT 18.4*  INR 1.54   ABG    Component Value Date/Time   PHART 7.311 (L) 06/25/2017 2227   HCO3 23.8 06/25/2017 2227   TCO2 25 06/25/2017 2227   ACIDBASEDEF 3.0 (H) 06/25/2017 2227   O2SAT 98.0 06/25/2017 2227   CBG (last 3)  Recent Labs    06/26/17 0408 06/26/17 0501 06/26/17 0618  GLUCAP 126* 125* 131*    Assessment/Plan: S/P Procedure(s) (LRB): AORTIC VALVE REPLACEMENT (AVR) using Magna Ease Aortic Valve size 95mm (N/A) CORONARY ARTERY BYPASS GRAFTING (CABG) x Two , using left internal mammary artery and right leg greater saphenous vein harvested endoscopically (N/A) TRANSESOPHAGEAL ECHOCARDIOGRAM (TEE) (N/A) Doing well after AVR-CABG periop coagulopathy and low plts Progression orders  LOS: 1 day    Tharon Aquas Trigt III 06/26/2017

## 2017-06-26 NOTE — Progress Notes (Signed)
[facility] BRIEF SICU PROGRESS NOTE  1 Day Post-Op  S/P Procedure(s) (LRB): AORTIC VALVE REPLACEMENT (AVR) using Magna Ease Aortic Valve size 74mm (N/A) CORONARY ARTERY BYPASS GRAFTING (CABG) x Two , using left internal mammary artery and right leg greater saphenous vein harvested endoscopically (N/A) TRANSESOPHAGEAL ECHOCARDIOGRAM (TEE) (N/A)   Stable day NSR w/ stable BP O2 sats 96% on room air Excellent UOP Labs okay  Plan: Continue current plan  Rexene Alberts, MD 06/26/2017 6:59 PM

## 2017-06-27 ENCOUNTER — Inpatient Hospital Stay (HOSPITAL_COMMUNITY): Payer: Managed Care, Other (non HMO)

## 2017-06-27 LAB — GLUCOSE, CAPILLARY
Glucose-Capillary: 107 mg/dL — ABNORMAL HIGH (ref 65–99)
Glucose-Capillary: 91 mg/dL (ref 65–99)
Glucose-Capillary: 106 mg/dL — ABNORMAL HIGH (ref 65–99)
Glucose-Capillary: 105 mg/dL — ABNORMAL HIGH (ref 65–99)
Glucose-Capillary: 140 mg/dL — ABNORMAL HIGH (ref 65–99)

## 2017-06-27 LAB — CBC
HCT: 29.6 % — ABNORMAL LOW (ref 39.0–52.0)
Hemoglobin: 10.2 g/dL — ABNORMAL LOW (ref 13.0–17.0)
MCH: 30.5 pg (ref 26.0–34.0)
MCHC: 34.5 g/dL (ref 30.0–36.0)
MCV: 88.6 fL (ref 78.0–100.0)
Platelets: 70 10*3/uL — ABNORMAL LOW (ref 150–400)
RBC: 3.34 MIL/uL — ABNORMAL LOW (ref 4.22–5.81)
RDW: 13.2 % (ref 11.5–15.5)
WBC: 9.1 10*3/uL (ref 4.0–10.5)

## 2017-06-27 LAB — BASIC METABOLIC PANEL
Anion gap: 6 (ref 5–15)
BUN: 15 mg/dL (ref 6–20)
CO2: 25 mmol/L (ref 22–32)
Calcium: 8.2 mg/dL — ABNORMAL LOW (ref 8.9–10.3)
Chloride: 102 mmol/L (ref 101–111)
Creatinine, Ser: 0.75 mg/dL (ref 0.61–1.24)
GFR calc Af Amer: >60 mL/min (ref >60)
GFR calc non Af Amer: >60 mL/min (ref >60)
Glucose, Bld: 111 mg/dL — ABNORMAL HIGH (ref 65–99)
Potassium: 4 mmol/L (ref 3.5–5.1)
Sodium: 133 mmol/L — ABNORMAL LOW (ref 135–145)

## 2017-06-27 MED ORDER — MOVING RIGHT ALONG BOOK
Freq: Once | Status: AC
  Administered 2017-06-27: 1
  Filled 2017-06-27: qty 1

## 2017-06-27 MED ORDER — POTASSIUM CHLORIDE CRYS ER 20 MEQ PO TBCR
20.0000 meq | EXTENDED_RELEASE_TABLET | Freq: Two times a day (BID) | ORAL | Status: DC
  Administered 2017-06-27 – 2017-06-28 (×4): 20 meq via ORAL
  Filled 2017-06-27 (×4): qty 1

## 2017-06-27 MED ORDER — INSULIN ASPART 100 UNIT/ML ~~LOC~~ SOLN: 0.0000 [IU] | Freq: Three times a day (TID) | SUBCUTANEOUS | Status: DC

## 2017-06-27 MED ORDER — FUROSEMIDE 40 MG PO TABS
40.0000 mg | ORAL_TABLET | Freq: Two times a day (BID) | ORAL | Status: DC
  Administered 2017-06-27 – 2017-06-28 (×4): 40 mg via ORAL
  Filled 2017-06-27 (×4): qty 1

## 2017-06-27 NOTE — Plan of Care (Signed)
Pt demonstrating clear understanding of post-op routine r/t pulmonary toilet, increase in activity and advancement of diet.   Pt O2 sats 96-97 % on room air, performs I.S. Without prompting to 2000-2500 cc. Lung sounds remain clear.  Pt tolerating getting OOB to chair for -3 hrs at a time without difficulties.

## 2017-06-27 NOTE — Progress Notes (Signed)
[address]       [place] [address]             [phone]        CARDIOTHORACIC SURGERY PROGRESS NOTE   R2 Days Post-Op Procedure(s) (LRB): AORTIC VALVE REPLACEMENT (AVR) using Magna Ease Aortic Valve size 21mm (N/A) CORONARY ARTERY BYPASS GRAFTING (CABG) x Two , using left internal mammary artery and right leg greater saphenous vein harvested endoscopically (N/A) TRANSESOPHAGEAL ECHOCARDIOGRAM (TEE) (N/A)  Subjective: No complaints.  Mild soreness in chest.  Hungry for breakfast.  No SOB  Objective: Vital signs: BP Readings from Last 1 Encounters:  06/27/17 134/86   Pulse Readings from Last 1 Encounters:  06/27/17 72   Resp Readings from Last 1 Encounters:  06/27/17 18   Temp Readings from Last 1 Encounters:  06/27/17 98.4 F (36.9 C) (Oral)    Hemodynamics: PAP: (24-26)/(12-15) 24/12  Physical Exam:  Rhythm:   sinus  Breath sounds: clear  Heart sounds:  RRR  Incisions:  Dressings dry, intact  Abdomen:  Soft, non-distended, non-tender  Extremities:  Warm, well-perfused  Chest tubes:  low volume thin serosanguinous output, no air leak    Intake/Output from previous day: 03/22 0701 - 03/23 0700 In: 1896.8 [P.O.:820; I.V.:526.8; IV Piggyback:550] Out: 2075 [Urine:1445; Chest Tube:630] Intake/Output this shift: Total I/O In: 123.4 [I.V.:23.4; IV Piggyback:100] Out: 110 [Urine:110]  Lab Results:  CBC: Recent Labs    06/26/17 1712 06/26/17 1720 06/27/17 0359  WBC 8.3  --  9.1  HGB 10.5* 10.2* 10.2*  HCT 30.5* 30.0* 29.6*  PLT 75*  --  70*    BMET:  Recent Labs    06/26/17 0405  06/26/17 1720 06/27/17 0359  NA 137  --  139 133*  K 3.7  --  3.6 4.0  CL 107  --  102 102  CO2 22  --   --  25  GLUCOSE 136*  --  139* 111*  BUN 11  --  16 15  CREATININE 0.76   < > 0.70 0.75  CALCIUM 8.0*  --   --  8.2*   < > = values in this interval not displayed.     PT/INR:   Recent Labs    06/25/17 1536  LABPROT 18.4*  INR  1.54    CBG (last 3)  Recent Labs    06/26/17 2321 06/27/17 0402 06/27/17 0756  GLUCAP 92 107* 91    ABG    Component Value Date/Time   PHART 7.311 (L) 06/25/2017 2227   PCO2ART 47.0 06/25/2017 2227   PO2ART 111.0 (H) 06/25/2017 2227   HCO3 23.8 06/25/2017 2227   TCO2 23 06/26/2017 1720   ACIDBASEDEF 3.0 (H) 06/25/2017 2227   O2SAT 98.0 06/25/2017 2227    CXR: PORTABLE CHEST 1 VIEW  COMPARISON:  Chest x-ray from yesterday.  FINDINGS: Interval removal of the Swan-Ganz catheter. The right internal jugular sheath remains in place. Unchanged mediastinal and left chest tubes. Stable cardiomediastinal silhouette status post aortic valve replacement. Unchanged small left pleural effusion with adjacent left basilar atelectasis. The right lung is clear. No pneumothorax. No acute osseous abnormality.  IMPRESSION: Unchanged small left pleural effusion with adjacent left lower lobe atelectasis.   Electronically Signed   By: Titus Dubin M.D.   On: 06/27/2017 07:55   Assessment/Plan: S/P Procedure(s) (LRB): AORTIC VALVE REPLACEMENT (AVR) using Magna Ease Aortic Valve size 81mm (N/A) CORONARY ARTERY BYPASS GRAFTING (CABG) x Two , using left  internal mammary artery and right leg greater saphenous vein harvested endoscopically (N/A) TRANSESOPHAGEAL ECHOCARDIOGRAM (TEE) (N/A)  Doing well POD2 Maintaining NSR w/ stable BP on low dose milrinone Breathing comfortably w/ O2 sats 95% on room air Platelet count stable   Mobilize  Diuresis  D/C tubes and lines  D/C milrinone  Transfer 4E  Rexene Alberts, MD 06/27/2017 9:40 AM

## 2017-06-27 NOTE — Plan of Care (Signed)
  Problem: Health Behavior/Discharge Planning: Goal: Ability to manage health-related needs will improve Outcome: Progressing   Problem: Clinical Measurements: Goal: Ability to maintain clinical measurements within normal limits will improve Outcome: Progressing Goal: Will remain free from infection Outcome: Progressing Goal: Respiratory complications will improve Outcome: Progressing   Problem: Activity: Goal: Risk for activity intolerance will decrease Outcome: Progressing   Problem: Pain Managment: Goal: General experience of comfort will improve Outcome: Progressing   Problem: Education: Goal: Knowledge of General Education information will improve Outcome: Completed/Met   Problem: Nutrition: Goal: Adequate nutrition will be maintained Outcome: Completed/Met   Problem: Elimination: Goal: Will not experience complications related to bowel motility Outcome: Completed/Met Goal: Will not experience complications related to urinary retention Outcome: Completed/Met

## 2017-06-28 ENCOUNTER — Other Ambulatory Visit: Payer: Self-pay

## 2017-06-28 ENCOUNTER — Inpatient Hospital Stay (HOSPITAL_COMMUNITY): Payer: Managed Care, Other (non HMO)

## 2017-06-28 LAB — CBC
HCT: 31.7 % — ABNORMAL LOW (ref 39.0–52.0)
Hemoglobin: 10.9 g/dL — ABNORMAL LOW (ref 13.0–17.0)
MCH: 30.4 pg (ref 26.0–34.0)
MCHC: 34.4 g/dL (ref 30.0–36.0)
MCV: 88.3 fL (ref 78.0–100.0)
PLATELETS: 84 10*3/uL — AB (ref 150–400)
RBC: 3.59 MIL/uL — ABNORMAL LOW (ref 4.22–5.81)
RDW: 13.1 % (ref 11.5–15.5)
WBC: 8.7 10*3/uL (ref 4.0–10.5)

## 2017-06-28 LAB — BASIC METABOLIC PANEL
ANION GAP: 8 (ref 5–15)
BUN: 11 mg/dL (ref 6–20)
CALCIUM: 8.5 mg/dL — AB (ref 8.9–10.3)
CO2: 24 mmol/L (ref 22–32)
CREATININE: 0.75 mg/dL (ref 0.61–1.24)
Chloride: 106 mmol/L (ref 101–111)
GLUCOSE: 102 mg/dL — AB (ref 65–99)
Potassium: 3.6 mmol/L (ref 3.5–5.1)
Sodium: 138 mmol/L (ref 135–145)

## 2017-06-28 LAB — GLUCOSE, CAPILLARY
Glucose-Capillary: 91 mg/dL (ref 65–99)
Glucose-Capillary: 77 mg/dL (ref 65–99)

## 2017-06-28 MED ORDER — METOPROLOL TARTRATE 25 MG PO TABS
25.0000 mg | ORAL_TABLET | Freq: Two times a day (BID) | ORAL | Status: DC
  Administered 2017-06-28 – 2017-06-30 (×4): 25 mg via ORAL
  Filled 2017-06-28 (×4): qty 1

## 2017-06-28 NOTE — Progress Notes (Addendum)
      [address]       [place] [address]             [phone]      3 Days Post-Op Procedure(s) (LRB): AORTIC VALVE REPLACEMENT (AVR) using Magna Ease Aortic Valve size 38mm (N/A) CORONARY ARTERY BYPASS GRAFTING (CABG) x Two , using left internal mammary artery and right leg greater saphenous vein harvested endoscopically (N/A) TRANSESOPHAGEAL ECHOCARDIOGRAM (TEE) (N/A)   Subjective:  No new complaints.  Overall states he is feeling pretty good.  + ambulation  + BM  Objective: Vital signs in last 24 hours: Temp:  [98.6 F (37 C)-99.4 F (37.4 C)] 98.8 F (37.1 C) (03/24 0530) Pulse Rate:  [70-91] 85 (03/24 0530) Cardiac Rhythm: Normal sinus rhythm;Bundle branch block (03/24 0718) Resp:  [14-27] 17 (03/24 0530) BP: (142-163)/(82-102) 142/88 (03/24 0530) SpO2:  [92 %-98 %] 95 % (03/24 0530) Weight:  [200 lb 12.8 oz (91.1 kg)-209 lb 3.5 oz (94.9 kg)] 200 lb 12.8 oz (91.1 kg) (03/24 0530)  Intake/Output from previous day: 03/23 0701 - 03/24 0700 In: 418 [P.O.:240; I.V.:78; IV Piggyback:100] Out: 2675 [Urine:2655; Chest Tube:20]  General appearance: alert, cooperative and no distress Heart: regular rate and rhythm Lungs: clear to auscultation bilaterally Abdomen: soft, non-tender; bowel sounds normal; no masses,  no organomegaly Extremities: edema none appreciated  Wound: clean and dry  Lab Results: Recent Labs    06/27/17 0359 06/28/17 0244  WBC 9.1 8.7  HGB 10.2* 10.9*  HCT 29.6* 31.7*  PLT 70* 84*   BMET:  Recent Labs    06/27/17 0359 06/28/17 0244  NA 133* 138  K 4.0 3.6  CL 102 106  CO2 25 24  GLUCOSE 111* 102*  BUN 15 11  CREATININE 0.75 0.75  CALCIUM 8.2* 8.5*    PT/INR:  Recent Labs    06/25/17 1536  LABPROT 18.4*  INR 1.54   ABG    Component Value Date/Time   PHART 7.311 (L) 06/25/2017 2227   HCO3 23.8 06/25/2017 2227   TCO2 23 06/26/2017 1720   ACIDBASEDEF 3.0 (H) 06/25/2017 2227   O2SAT 98.0 06/25/2017 2227    CBG (last 3)  Recent Labs    06/27/17 1744 06/27/17 2147 06/28/17 0628  GLUCAP 105* 140* 91    Assessment/Plan: S/P Procedure(s) (LRB): AORTIC VALVE REPLACEMENT (AVR) using Magna Ease Aortic Valve size 26mm (N/A) CORONARY ARTERY BYPASS GRAFTING (CABG) x Two , using left internal mammary artery and right leg greater saphenous vein harvested endoscopically (N/A) TRANSESOPHAGEAL ECHOCARDIOGRAM (TEE) (N/A)  1. CV- NSR with LBBB this appears to be new, he is hypertensive- on Lopressor at 12.5 mg BID 2. Pulm- no acute issues, continue IS,CXR with minimal pleural effusions/atelectasis 3. Renal- creatinine WNL, weight is stable, continue Lasix, supplement K 4. Thrombocytopenia stable 5. CBGs controlled, not a diabetic will d/c SSIp 6. Dispo- patient stable, maintaining NSR with new LBBB this morning, will discuss with Dr. Roxy Manns if this was present yesterday, continue current care   LOS: 3 days    Erin Barrett 06/28/2017  I have seen and examined the patient and agree with the assessment and plan as outlined.  Check 12-lead EKG  Rexene Alberts, MD 06/28/2017 10:44 AM

## 2017-06-28 NOTE — Anesthesia Postprocedure Evaluation (Signed)
Anesthesia Post Note  Patient: Todd Frank  Procedure(s) Performed: AORTIC VALVE REPLACEMENT (AVR) using Magna Ease Aortic Valve size 87mm (N/A Chest) CORONARY ARTERY BYPASS GRAFTING (CABG) x Two , using left internal mammary artery and right leg greater saphenous vein harvested endoscopically (N/A Chest) TRANSESOPHAGEAL ECHOCARDIOGRAM (TEE) (N/A )     Patient location during evaluation: PACU Anesthesia Type: General Level of consciousness: sedated and patient remains intubated per anesthesia plan Pain management: pain level controlled Vital Signs Assessment: post-procedure vital signs reviewed and stable Respiratory status: patient remains intubated per anesthesia plan Cardiovascular status: stable Anesthetic complications: no    Last Vitals:  Vitals:   06/28/17 1120 06/28/17 1943  BP: (!) 146/88 (!) 142/80  Pulse: 89 82  Resp: 20 (!) 28  Temp: 37.2 C 37.3 C  SpO2: 94% 97%    Last Pain:  Vitals:   06/28/17 1943  TempSrc: Oral  PainSc:    Pain Goal: Patients Stated Pain Goal: 2 (06/27/17 0400)               Nolon Nations

## 2017-06-29 ENCOUNTER — Encounter (HOSPITAL_COMMUNITY): Payer: Self-pay | Admitting: Physician Assistant

## 2017-06-29 DIAGNOSIS — I2510 Atherosclerotic heart disease of native coronary artery without angina pectoris: Secondary | ICD-10-CM

## 2017-06-29 MED ORDER — POTASSIUM CHLORIDE CRYS ER 20 MEQ PO TBCR
20.0000 meq | EXTENDED_RELEASE_TABLET | Freq: Every day | ORAL | Status: DC
  Administered 2017-06-29 – 2017-06-30 (×2): 20 meq via ORAL
  Filled 2017-06-29 (×2): qty 1

## 2017-06-29 MED ORDER — FUROSEMIDE 40 MG PO TABS
40.0000 mg | ORAL_TABLET | Freq: Every day | ORAL | Status: DC
  Administered 2017-06-29 – 2017-06-30 (×2): 40 mg via ORAL
  Filled 2017-06-29 (×2): qty 1

## 2017-06-29 NOTE — Progress Notes (Addendum)
      [address]       [place] [address]             [phone]        4 Days Post-Op Procedure(s) (LRB): AORTIC VALVE REPLACEMENT (AVR) using Magna Ease Aortic Valve size 58mm (N/A) CORONARY ARTERY BYPASS GRAFTING (CABG) x Two , using left internal mammary artery and right leg greater saphenous vein harvested endoscopically (N/A) TRANSESOPHAGEAL ECHOCARDIOGRAM (TEE) (N/A)  Subjective: Patient without complaints this am.  Objective: Vital signs in last 24 hours: Temp:  [98.4 F (36.9 C)-99.1 F (37.3 C)] 98.4 F (36.9 C) (03/25 0510) Pulse Rate:  [72-89] 72 (03/25 0510) Cardiac Rhythm: Normal sinus rhythm (03/24 1925) Resp:  [17-28] 17 (03/25 0510) BP: (131-146)/(80-88) 131/87 (03/25 0510) SpO2:  [92 %-97 %] 92 % (03/25 0510) Weight:  [194 lb 8 oz (88.2 kg)] 194 lb 8 oz (88.2 kg) (03/25 0510)  Pre op weight 89.8 kg Current Weight  06/29/17 194 lb 8 oz (88.2 kg)      Intake/Output from previous day: 03/24 0701 - 03/25 0700 In: 680 [P.O.:680] Out: 2600 [Urine:2600]   Physical Exam:  Cardiovascular: RRR, no murmur Pulmonary: Clear to auscultation bilaterally Abdomen: Soft, non tender, bowel sounds present. Extremities: Trace bilateral lower extremity edema. Wounds: Clean and dry.  No erythema or signs of infection.  Lab Results: CBC: Recent Labs    06/27/17 0359 06/28/17 0244  WBC 9.1 8.7  HGB 10.2* 10.9*  HCT 29.6* 31.7*  PLT 70* 84*   BMET:  Recent Labs    06/27/17 0359 06/28/17 0244  NA 133* 138  K 4.0 3.6  CL 102 106  CO2 25 24  GLUCOSE 111* 102*  BUN 15 11  CREATININE 0.75 0.75  CALCIUM 8.2* 8.5*    PT/INR:  Lab Results  Component Value Date   INR 1.54 06/25/2017   INR 1.11 06/23/2017   INR 1.1 02/15/2016   ABG:  INR: Will add last result for INR, ABG once components are confirmed Will add last 4 CBG results once components are confirmed  Assessment/Plan:  1. CV - New LBBB. On Lopressor 25 mg bid. Will  start low dose Lisinopril for better BP control. Will ask cardiology to evaluate (pateint of [name]) for new LBBB. 2.  Pulmonary - On room air. Encourage incentive spirometer 3. Volume Overload - On Lasix 40 mg bid. Will decrease to daily. 4.  Acute blood loss anemia - H and H yesterday 10.9 and 31.7 5. Thrombocytopenia-platelets yesterday 84,000 6. Remove EPW 7. Possible discharge 1-2 days  Todd M ZimmermanPA-C 06/29/2017,7:07 AM

## 2017-06-29 NOTE — Consult Note (Addendum)
Cardiology Consultation:   Patient ID: Todd Frank; [id number]; 13-Dec-1955   Admit date: 06/25/2017 Date of Consult: 06/29/2017  Primary Care Provider: Olin Hauser, DO Primary Cardiologist: [name]  Chief Complaint: here for valve surgery  Patient Profile:   Todd Frank is a 62 y.o. male with a hx of 2V CAD and bicuspid aortic valve/severe AS s/p bioprosthetic AVR/CABGx2 this admission on 06/25/17, mildly dilated aortic root, HTN, HLD, thrombocytopenia by labs who is being seen today for the evaluation of possible LBBB at the request of TCTS team Dr. Lucianne Lei Trigt/Donielle Tacy Dura.  History of Present Illness:   He has known CAD which was previously managed medically. He was recently seen at [facility] 05/2017 with dizziness/syncope while on a jog. He had pushed himself harder than he usually did and really pushed his HR. He woke up on the ground. A bystander offered to call 911 but because he felt completely normal after he awoke, he walked home. Troponins were low and flat. Carotid US was unremarkable. Cardiac workup revealed progression to severe aortic stenosis and 2-vessel coronary artery disease involving the LAD and distal RCA/PDA, not significantly changed since prior catheterization in 02/2016. The patient demonstrated preference for biologic valve which would not commit him to lifelong Coumadin. On 3/21 he went to the OR and had AVR with [facility] Ease Aortic Valve ( size 27mm, Model# [id number], Serial # [id number]), CABG x 2 (LIMA to LAD and SVG to PDA) using left internal mammary artery and right leg greater saphenous vein harvested endoscopically. There was question of PFO on a prior echo, but his intra-op report indicated there was no evidence of such. His post-op course has been unremarkable. TCTS note yesterday raised concern for new LBBB. EKG was checked which did not show this. QRS duration was 79ms. His telemetry was reviewed extensively over the last several days and does not  show any new LBBB. He has had occasional PACs/PVCs but no other significant arrhythmias. He's not had any bradycardia and has tolerated Lopressor well. Of note, his initial post-op EKG 3/21 did show a new RBBB. This resolved by subsequent tracings and was not seen on prior tracings. Labs reveal post op anemia/thrombocyopenia. He did appear to base a baseline low platelet count down to 119 pre-surgery. He's not had this formally evaluated but says he's been notified of this in the past, that perhaps his platelets clump in the tube, causing falsely low readings. Renal function stable post-op. He reports today he's feeling good and the course has been smoother than he thought it would be. No SOB, swelling, CP, dizziness. BP is stable this AM at 131/87. He's ambulating independently and has done well with cardiac rehab team.   Past Medical History:  Diagnosis Date  . Aortic stenosis    a. syncope/severe AS s/p bioprosthetic AVR 06/2017.  . Arthritis   . Bicuspid aortic valve   . Bradycardia 07/23/2015  . Broken clavicle    as child, can still dislocate at times  . CAD (coronary artery disease), native coronary artery    a. s/p 2V CABG at time of AVR 06/2017.  . Carotid bruit    carotid dopplers negative.  Bruit due to heart murmur from AS  . Chronic diastolic CHF (congestive heart failure) ([place])   . Congenital nevus   . Diastolic dysfunction   . Dilated aortic root ([place])    78mm by echo 02/2016  . GERD (gastroesophageal reflux disease)   . Headache   .  HTN (hypertension)   . Hyperlipidemia    LDL goal < 70  . LVH (left ventricular hypertrophy)   . Melanoma of skin ([place])   . Obesity   . PFO (patent foramen ovale)    small by echo 2014, but not demonstrated during intra-op note from CABG/AVR 06/2017.    Past Surgical History:  Procedure Laterality Date  . AORTIC VALVE REPLACEMENT N/A 06/25/2017   Procedure: AORTIC VALVE REPLACEMENT (AVR) using Magna Ease Aortic Valve size 57mm;  Surgeon: Ivin Poot, MD;  Location: [place];  Service: Open Heart Surgery;  Laterality: N/A;  . arthritic cyst removal  2017   from Lumbar 4-5, dr pool  . CARDIAC CATHETERIZATION N/A 02/20/2016   Procedure: Right/Left Heart Cath and Coronary Angiography;  Surgeon: Belva Crome, MD;  Location: [place] CV [facility];  Service: Cardiovascular;  Laterality: N/A;  . CORONARY ARTERY BYPASS GRAFT N/A 06/25/2017   Procedure: CORONARY ARTERY BYPASS GRAFTING (CABG) x Two , using left internal mammary artery and right leg greater saphenous vein harvested endoscopically;  Surgeon: Ivin Poot, MD;  Location: [place];  Service: Open Heart Surgery;  Laterality: N/A;  . CORONARY/GRAFT ANGIOGRAPHY N/A 06/02/2017   Procedure: CORONARY/GRAFT ANGIOGRAPHY;  Surgeon: Nelva Bush, MD;  Location: [place] [facility];  Service: Cardiovascular;  Laterality: N/A;  . EYE SURGERY    . MELANOMA EXCISION  2011   left thigh  . RIGHT HEART CATH N/A 06/02/2017   Procedure: RIGHT HEART CATH;  Surgeon: Nelva Bush, MD;  Location: [place] [facility];  Service: Cardiovascular;  Laterality: N/A;  . TEE WITHOUT CARDIOVERSION N/A 06/25/2017   Procedure: TRANSESOPHAGEAL ECHOCARDIOGRAM (TEE);  Surgeon: Prescott Gum, Collier Salina, MD;  Location: [place];  Service: Open Heart Surgery;  Laterality: N/A;  . TONSILLECTOMY       Inpatient Medications: Scheduled Meds: . acetaminophen  1,000 mg Oral Q6H  . aspirin EC  81 mg Oral Daily  . atorvastatin  80 mg Oral Daily  . bisacodyl  10 mg Oral Daily   Or  . bisacodyl  10 mg Rectal Daily  . Chlorhexidine Gluconate Cloth  6 each Topical Daily  . docusate sodium  200 mg Oral Daily  . furosemide  40 mg Oral Daily  . mouth rinse  15 mL Mouth Rinse BID  . metoprolol tartrate  25 mg Oral BID  . mupirocin ointment  1 application Nasal BID  . pantoprazole  40 mg Oral Daily  . potassium chloride  20 mEq Oral Daily  . sodium chloride flush  10-40 mL Intracatheter Q12H  . sodium chloride flush  3 mL  Intravenous Q12H   Continuous Infusions: . sodium chloride     PRN Meds: metoprolol tartrate, ondansetron (ZOFRAN) IV, oxyCODONE, sodium chloride flush, sodium chloride flush, traMADol  Home Meds: Prior to Admission medications   Medication Sig Start Date End Date Taking? Authorizing Provider  aspirin EC 81 MG tablet Take 81 mg by mouth daily.   Yes [provider]  atorvastatin (LIPITOR) 80 MG tablet Take 1 tablet (80 mg total) by mouth daily. Please make overdue yearly appt with [name] before anymore refills. 2nd attempt 06/15/17  Yes [name], [name], MD  Coenzyme Q10 (COQ-10) 200 MG CAPS Take 200 mg by mouth daily.   Yes [provider]  fluticasone (FLONASE) 50 MCG/ACT nasal spray Place 2 sprays into both nostrils daily. Patient taking differently: Place 2 sprays into both nostrils daily as needed (for allergies.).  08/25/16  Yes Johnson, Megan P, DO    Allergies:   No Known Allergies  Social History:   Social History   Socioeconomic History  . Marital status: Married    Spouse name: Not on file  . Number of children: Not on file  . Years of education: Not on file  . Highest education level: Not on file  Occupational History  . 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 Financial resource strain: Not hard at all  . Food insecurity:    Worry: Never true    Inability: Never true  . Transportation needs:    Medical: No    Non-medical: No  Tobacco Use  . Smoking status: Former Research scientist (life sciences)  . Smokeless tobacco: Never Used  . Tobacco comment: quit in 78, teenager  Substance and Sexual Activity  . Alcohol use: Yes    Alcohol/week: 0.6 oz    Types: 1 Cans of beer per week    Comment: occasional  . Drug use: No  . Sexual activity: Yes  Lifestyle  . Physical activity:    Days per week: 7 days    Minutes per session: 80 min  . Stress: Not at all  Relationships  . Social connections:    Talks on phone: Three times a week    Gets together:  More than three times a week    Attends religious service: Never    Active member of club or organization: No    Attends meetings of clubs or organizations: Never    Relationship status: Married  . Intimate partner violence:    Fear of current or ex partner: No    Emotionally abused: No    Physically abused: No    Forced sexual activity: No  Other Topics Concern  . Not on file  Social History Narrative  . Not on file    Family History:   The patient's family history includes Arthritis in his father; Diabetes in his mother; Heart attack in his unknown relative; Heart disease in his paternal grandmother; Pancreatic cancer in his maternal aunt.  ROS:  Please see the history of present illness.  All other ROS reviewed and negative.     Physical Exam/Data:   Vitals:   06/28/17 0530 06/28/17 1120 06/28/17 1943 06/29/17 0510  BP: (!) 142/88 (!) 146/88 (!) 142/80 131/87  Pulse: 85 89 82 72  Resp: 17 20 (!) 28 17  Temp: 98.8 F (37.1 C) 98.9 F (37.2 C) 99.1 F (37.3 C) 98.4 F (36.9 C)  TempSrc: Oral Oral Oral Oral  SpO2: 95% 94% 97% 92%  Weight: 200 lb 12.8 oz (91.1 kg)   194 lb 8 oz (88.2 kg)  Height:        Intake/Output Summary (Last 24 hours) at 06/29/2017 1444 Last data filed at 06/29/2017 0730 Gross per 24 hour  Intake 600 ml  Output 2600 ml  Net -2000 ml   Filed Weights   06/27/17 1621 06/28/17 0530 06/29/17 0510  Weight: 209 lb 3.5 oz (94.9 kg) 200 lb 12.8 oz (91.1 kg) 194 lb 8 oz (88.2 kg)   Body mass index is 28.72 kg/m.  General: Well developed, well nourished WM, in no acute distress. Head: Normocephalic, atraumatic, sclera non-icteric, no xanthomas, nares are without discharge. Neck: Negative for carotid bruits. JVD not elevated. Lungs: Clear bilaterally to auscultation without wheezes, rales, or rhonchi. Breathing is unlabored. Heart: RRR with S1 S2, 2/6 SEM mid sternal. No rubs or gallops appreciated. Abdomen: Soft, non-tender,  non-distended with  normoactive bowel sounds. No hepatomegaly. No rebound/guarding. No obvious abdominal masses. Msk:  Strength and tone appear normal for age. Extremities: No clubbing or cyanosis. No edema.  Distal pedal pulses are 2+ and equal bilaterally. Neuro: Alert and oriented X 3. No facial asymmetry. No focal deficit. Moves all extremities spontaneously. Psych:  Responds to questions appropriately with a normal affect.  EKG:  The EKG was personally reviewed and demonstrates NSR 79bpm, possible LAE, prior inferior infarct, TWI II, III, avF (similar to prior), TWI V3-V6 new from previous (but has had intermittent TWI in V6 in the past)  Relevant CV Studies: Referenced above.  Laboratory Data:  Chemistry Recent Labs  Lab 06/26/17 0405 06/26/17 1712 06/26/17 1720 06/27/17 0359 06/28/17 0244  NA 137  --  139 133* 138  K 3.7  --  3.6 4.0 3.6  CL 107  --  102 102 106  CO2 22  --   --  25 24  GLUCOSE 136*  --  139* 111* 102*  BUN 11  --  16 15 11   CREATININE 0.76 0.80 0.70 0.75 0.75  CALCIUM 8.0*  --   --  8.2* 8.5*  GFRNONAA >60 >60  --  >60 >60  GFRAA >60 >60  --  >60 >60  ANIONGAP 8  --   --  6 8    Recent Labs  Lab 06/23/17 1048  PROT 6.2*  ALBUMIN 4.3  AST 27  ALT 27  ALKPHOS 79  BILITOT 1.0   Hematology Recent Labs  Lab 06/26/17 1712 06/26/17 1720 06/27/17 0359 06/28/17 0244  WBC 8.3  --  9.1 8.7  RBC 3.45*  --  3.34* 3.59*  HGB 10.5* 10.2* 10.2* 10.9*  HCT 30.5* 30.0* 29.6* 31.7*  MCV 88.4  --  88.6 88.3  MCH 30.4  --  30.5 30.4  MCHC 34.4  --  34.5 34.4  RDW 13.2  --  13.2 13.1  PLT 75*  --  70* 84*   Radiology/Studies:  Dg Chest 2 View  Result Date: 06/28/2017 CLINICAL DATA:  Atelectasis.  Recent aortic valve replacement. EXAM: CHEST - 2 VIEW COMPARISON:  06/27/2017 FINDINGS: Right jugular line has been removed. Chest drains have been removed. Negative for a pneumothorax. Few densities at the left lung base are suggestive for atelectasis and possibly small  effusion. Evidence for a small right pleural effusion. Postsurgical changes with a prosthetic aortic valve and epicardial pacer wires. Bridging osteophytes in the thoracic spine. IMPRESSION: Removal of chest tubes without pneumothorax. Left basilar atelectasis and probable small pleural effusions. Electronically Signed   By: Markus Daft M.D.   On: 06/28/2017 09:10   Dg Chest Port 1 View  Result Date: 06/27/2017 CLINICAL DATA:  Aortic valve replacement. EXAM: PORTABLE CHEST 1 VIEW COMPARISON:  Chest x-ray from yesterday. FINDINGS: Interval removal of the Swan-Ganz catheter. The right internal jugular sheath remains in place. Unchanged mediastinal and left chest tubes. Stable cardiomediastinal silhouette status post aortic valve replacement. Unchanged small left pleural effusion with adjacent left basilar atelectasis. The right lung is clear. No pneumothorax. No acute osseous abnormality. IMPRESSION: Unchanged small left pleural effusion with adjacent left lower lobe atelectasis. Electronically Signed   By: Titus Dubin M.D.   On: 06/27/2017 07:55   Dg Chest Port 1 View  Result Date: 06/26/2017 CLINICAL DATA:  Status post coronary bypass grafting EXAM: PORTABLE CHEST 1 VIEW COMPARISON:  06/25/2017 FINDINGS: Endotracheal tube and nasogastric catheter have been removed in the interval. The  left thoracostomy catheter and mediastinal drain are again seen as is a Swan-Ganz catheter. The cardiac shadow is stable. Aortic valve replacement is noted. Mild left basilar atelectasis is seen. No sizable pneumothorax is noted. IMPRESSION: Postsurgical changes with tubes and lines as described above. Mild left basilar atelectasis is again noted. Electronically Signed   By: Inez Catalina M.D.   On: 06/26/2017 08:31   Dg Chest Port 1 View  Result Date: 06/25/2017 CLINICAL DATA:  Status post coronary bypass grafting EXAM: PORTABLE CHEST 1 VIEW COMPARISON:  06/23/2017 FINDINGS: Cardiac shadow is within normal limits.  Changes of aortic valve replacement are noted. Swan-Ganz catheter, endotracheal tube and nasogastric catheter are noted in satisfactory position. Mediastinal drain is seen. Left thoracostomy catheter is noted without pneumothorax. Mild left basilar atelectasis is seen. IMPRESSION: Postsurgical changes with mild left basilar atelectasis. Electronically Signed   By: Inez Catalina M.D.   On: 06/25/2017 15:50    Assessment and Plan:   1. Abnormal EKG/suspected conduction abnormality - spoke with CVTS APP. Patient was followed by CVTS colleagues over the weekend. Although telemetry may have had appearance of LBBB, there does not appear to be any formal evidence of such. Confirmatory EKG showed QRS measured at 24ms. He did have a transient RBBB on his first post-op EKG as well as some nonspecific TW changes in follow-up, but in discussion with Dr. Acie Fredrickson this was likely due to cardiac edema/changes related to immediate post-op status. He has not had any evidence of bradycardia on telemetry. Would continue to follow on telemetry until DC but as of right now, we are not seeing any pathologic conduction abnormalities. He does have mild variation of his QRS complex amplitude at times, therefore we'll order a limited echo to rule out pericardial effusion, but he does not presently show any clinical signs of this. Should be done prior to dc.  2. CAD s/p CABG - progressing well. Continue ASA, BB, statin. Most recent LDL 44.  3. Syncope 05/2017 - this was felt related to his progression of severe AS and extreme exertion. No evidence of bradycardia. Follow.  4. Thrombocytopenia - noted pre-operatively as well. He states he was told perhaps his platelets clump and need to be counted manually. He has had post-op worsening of such. Now would not be the time to assess, but could consider in the future drawing with a citrate tube and if still low, consider hematology eval.  5. Essential HTN - Lasix has been titrated today.  He is continued on Lopressor. Follow.  F/u arranged 07/14/17, appt placed on chart.  For questions or updates, please contact [place] Please consult [email or web address] for contact info under Cardiology/STEMI.    Signed, Charlie Pitter, PA-C  06/29/2017 2:44 PM   Attending Note:   The patient was seen and examined.  Agree with assessment and plan as noted above.  Changes made to the above note as needed.  Patient seen and independently examined with Melina Copa, PA .   We discussed all aspects of the encounter. I agree with the assessment and plan as stated above.  1.  EKG abnormalities : The patient has had some very minor EKG of normalities following his aortic valve replacement.  He developed transient right bundle branch block and now has developed some T wave inversions.  He is not had any episodes of chest pain.  He had coronary artery disease but what was bypassed at the time of surgery.  He has not had any other complications.  He has not had any episodes of chest pain that sound like angina.  We will get an echocardiogram to evaluate him for the possibility of a pericardial effusion and to evaluate his wall motion.  I do not think he needs any additional testing at this time.  We will ambulate him in the hall and make sure that he is stable before discharge.   I have spent a total of 40 minutes with patient reviewing hospital  notes , telemetry, EKGs, labs and examining patient as well as establishing an assessment and plan that was discussed with the patient. > 50% of time was spent in direct patient care.    Thayer Headings, Brooke Bonito., MD, [facility] 06/29/2017, 3:45 PM [address]  [place] Pager [phone]

## 2017-06-29 NOTE — Progress Notes (Signed)
CARDIAC REHAB PHASE I   PRE:  Rate/Rhythm: 80 SR with PVCs LBBB    BP: sitting 143/79    SaO2: 97 RA  MODE:  Ambulation: 1900 ft   POST:  Rate/Rhythm: 105 ST LBBB    BP: sitting 145/77     SaO2: 97 RA  Pt doing great. Able to get OOB and ambulate independently. No c/o, walked 1/3 mile. To recliner. Doing well with IS as well, almost to baseline. Discussed CRPII and will send referral to [place]. Gave pt diet sheet to review. He can walk independently and we will f/u tomorrow for education. [place], ACSM 06/29/2017 9:39 AM

## 2017-06-30 ENCOUNTER — Other Ambulatory Visit (HOSPITAL_COMMUNITY): Payer: Managed Care, Other (non HMO)

## 2017-06-30 ENCOUNTER — Inpatient Hospital Stay (HOSPITAL_COMMUNITY): Payer: Managed Care, Other (non HMO)

## 2017-06-30 DIAGNOSIS — I313 Pericardial effusion (noninflammatory): Secondary | ICD-10-CM

## 2017-06-30 DIAGNOSIS — Z953 Presence of xenogenic heart valve: Secondary | ICD-10-CM

## 2017-06-30 DIAGNOSIS — R9431 Abnormal electrocardiogram [ECG] [EKG]: Secondary | ICD-10-CM

## 2017-06-30 LAB — CBC
HCT: 32.4 % — ABNORMAL LOW (ref 39.0–52.0)
Hemoglobin: 11.1 g/dL — ABNORMAL LOW (ref 13.0–17.0)
MCH: 29.9 pg (ref 26.0–34.0)
MCHC: 34.3 g/dL (ref 30.0–36.0)
MCV: 87.3 fL (ref 78.0–100.0)
Platelets: 134 10*3/uL — ABNORMAL LOW (ref 150–400)
RBC: 3.71 MIL/uL — ABNORMAL LOW (ref 4.22–5.81)
RDW: 13.1 % (ref 11.5–15.5)
WBC: 6.6 10*3/uL (ref 4.0–10.5)

## 2017-06-30 LAB — ECHOCARDIOGRAM LIMITED
Height: 69 in
Weight: 3092.8 oz

## 2017-06-30 MED ORDER — CHLORHEXIDINE GLUCONATE CLOTH 2 % EX PADS
6.0000 | MEDICATED_PAD | Freq: Every day | CUTANEOUS | Status: DC
  Administered 2017-06-30: 6 via TOPICAL

## 2017-06-30 MED ORDER — METOPROLOL TARTRATE 25 MG PO TABS: 25.0000 mg | ORAL_TABLET | Freq: Two times a day (BID) | ORAL | 1 refills | Status: DC

## 2017-06-30 MED ORDER — TRAMADOL HCL 50 MG PO TABS: ORAL_TABLET | ORAL | 0 refills | Status: DC

## 2017-06-30 MED ORDER — POTASSIUM CHLORIDE CRYS ER 20 MEQ PO TBCR: 20.0000 meq | EXTENDED_RELEASE_TABLET | Freq: Every day | ORAL | 0 refills | Status: DC

## 2017-06-30 MED ORDER — FUROSEMIDE 40 MG PO TABS: 40.0000 mg | ORAL_TABLET | Freq: Every day | ORAL | 0 refills | Status: DC

## 2017-06-30 NOTE — Care Management Note (Signed)
Case Management Note Marvetta Gibbons RN, BSN Unit 4E-Case Manager [phone]  Patient Details  Name: Todd Frank MRN: [id number] Date of Birth: 07-11-1955  Subjective/Objective:  Pt admitted s/p AVR                  Action/Plan: PTA pt lived at home, plan to return home, no CM needs noted for transition home.   Expected Discharge Date:  06/30/17               Expected Discharge Plan:  Home/Self Care  In-House Referral:  NA  Discharge planning Services  CM Consult  Post Acute Care Choice:  NA Choice offered to:  NA  DME Arranged:    DME Agency:     HH Arranged:    HH Agency:     Status of Service:  Completed, signed off  If discussed at [place] of Stay Meetings, dates discussed:    Discharge Disposition: home/self care   Additional Comments:  Dawayne Patricia, RN 06/30/2017, 3:53 PM

## 2017-06-30 NOTE — Progress Notes (Addendum)
      [address]       [place] [address]             [phone]        5 Days Post-Op Procedure(s) (LRB): AORTIC VALVE REPLACEMENT (AVR) using Magna Ease Aortic Valve size 48mm (N/A) CORONARY ARTERY BYPASS GRAFTING (CABG) x Two , using left internal mammary artery and right leg greater saphenous vein harvested endoscopically (N/A) TRANSESOPHAGEAL ECHOCARDIOGRAM (TEE) (N/A)  Subjective: Patient without complaints this am. He hopes to go home today.  Objective: Vital signs in last 24 hours: Temp:  [97.7 F (36.5 C)-98.8 F (37.1 C)] 98.6 F (37 C) (03/26 0440) Pulse Rate:  [83-94] 83 (03/26 0440) Cardiac Rhythm: Normal sinus rhythm (03/25 1949) Resp:  [18-26] 18 (03/26 0440) BP: (114-142)/(73-89) 134/84 (03/26 0440) SpO2:  [95 %-100 %] 95 % (03/26 0440) Weight:  [193 lb 4.8 oz (87.7 kg)] 193 lb 4.8 oz (87.7 kg) (03/26 0440)  Pre op weight 89.8 kg Current Weight  06/30/17 193 lb 4.8 oz (87.7 kg)      Intake/Output from previous day: 03/25 0701 - 03/26 0700 In: 960 [P.O.:960] Out: 1450 [Urine:1450]   Physical Exam:  Cardiovascular: IRRR Pulmonary: Clear to auscultation bilaterally Abdomen: Soft, non tender, bowel sounds present. Extremities: Trace bilateral lower extremity edema. Wounds: Clean and dry.  No erythema or signs of infection.  Lab Results: CBC: Recent Labs    06/28/17 0244 06/30/17 0244  WBC 8.7 6.6  HGB 10.9* 11.1*  HCT 31.7* 32.4*  PLT 84* 134*   BMET:  Recent Labs    06/28/17 0244  NA 138  K 3.6  CL 106  CO2 24  GLUCOSE 102*  BUN 11  CREATININE 0.75  CALCIUM 8.5*    PT/INR:  Lab Results  Component Value Date   INR 1.54 06/25/2017   INR 1.11 06/23/2017   INR 1.1 02/15/2016   ABG:  INR: Will add last result for INR, ABG once components are confirmed Will add last 4 CBG results once components are confirmed  Assessment/Plan:  1. CV -  On Lopressor 25 mg bid. Will start low dose Lisinopril for better  BP control. Very much appreciate cardiology's evaluation yesterday. Await echo results. 2.  Pulmonary - On room air. Encourage incentive spirometer 3. Volume Overload - On Lasix 40 mg daily 4.  Acute blood loss anemia - H and H this am stable at 11.1 and 32.4 5. Thrombocytopenia-platelets increased this am to 134,000 6. Will discuss disposition with Dr. Lucianne Lei Trigt-possibly later today vs am  Sharalyn Ink [facility] 06/30/2017,7:13 AM  Echocardiogram today shows normal LV , AVR Ok for DC home patient examined and medical record reviewed,agree with above note. Tharon Aquas Trigt III 06/30/2017

## 2017-06-30 NOTE — Progress Notes (Signed)
Pt just back from walking with wife. Doing well. Ed completed with pt and wife. Good reception. He wishes his f/u appt was sooner. Will sign off. [phone] Yves Dill CES, ACSM 11:26 AM 06/30/2017

## 2017-06-30 NOTE — Discharge Instructions (Signed)
Coronary Artery Bypass Grafting, Care After This sheet gives you information about how to care for yourself after your procedure. Your health care provider may also give you more specific instructions. If you have problems or questions, contact your health care provider. What can I expect after the procedure? After the procedure, it is common to have:  Nausea and a lack of appetite.  Constipation.  Weakness and fatigue.  Depression or irritability.  Pain or discomfort in your incision areas.  Follow these instructions at home: Medicines  Take over-the-counter and prescription medicines only as told by your health care provider. Do not stop taking medicines or start any new medicines without approval from your health care provider.  If you were prescribed an antibiotic medicine, take it as told by your health care provider. Do not stop taking the antibiotic even if you start to feel better.  Do not drive or use heavy machinery while taking prescription pain medicine. Incision care  Follow instructions from your health care provider about how to take care of your incisions. Make sure you: ? Wash your hands with soap and water before you change your bandage (dressing). If soap and water are not available, use hand sanitizer. ? Change your dressing as told by your health care provider. ? Leave stitches (sutures), skin glue, or adhesive strips in place. These skin closures may need to stay in place for 2 weeks or longer. If adhesive strip edges start to loosen and curl up, you may trim the loose edges. Do not remove adhesive strips completely unless your health care provider tells you to do that.  Keep incision areas clean, dry, and protected.  Check your incision areas every day for signs of infection. Check for: ? More redness, swelling, or pain. ? More fluid or blood. ? Warmth. ? Pus or a bad smell.  If incisions were made in your legs: ? Avoid crossing your legs. ? Avoid  sitting for long periods of time. Change positions every 30 minutes. ? Raise (elevate) your legs when you are sitting. Bathing  Do not take baths, swim, or use a hot tub until your health care provider approves.  Only take sponge baths. Pat the incisions dry. Do not rub incisions with a washcloth or towel.  Ask your health care provider when you can shower. Eating and drinking  Eat foods that are high in fiber, such as raw fruits and vegetables, whole grains, beans, and nuts. Meats should be lean cut. Avoid canned, processed, and fried foods. This can help prevent constipation and is a recommended part of a heart-healthy diet.  Drink enough fluid to keep your urine clear or pale yellow.  Limit alcohol intake to no more than 1 drink a day for nonpregnant women and 2 drinks a day for men. One drink equals 12 oz of beer, 5 oz of wine, or 1 oz of hard liquor. Activity  Rest and limit your activity as told by your health care provider. You may be instructed to: ? Stop any activity right away if you have chest pain, shortness of breath, irregular heartbeats, or dizziness. Get help right away if you have any of these symptoms. ? Move around frequently for short periods or take short walks as directed by your health care provider. Gradually increase your activities. You may need physical therapy or cardiac rehabilitation to help strengthen your muscles and build your endurance. ? Avoid lifting, pushing, or pulling anything that is heavier than 10 lb (4.5 kg) for at  least 6 weeks or as told by your health care provider.  Do not drive until your health care provider approves.  Ask your health care provider when you may return to work.  Ask your health care provider when you may resume sexual activity. General instructions  Do not use any products that contain nicotine or tobacco, such as cigarettes and e-cigarettes. If you need help quitting, ask your health care provider.  Take 2-3 deep  breaths every few hours during the day, while you recover. This helps expand your lungs and prevent complications like pneumonia after surgery.  If you were given a device called an incentive spirometer, use it several times a day to practice deep breathing. Support your chest with a pillow or your arms when you take deep breaths or cough.  Wear compression stockings as told by your health care provider. These stockings help to prevent blood clots and reduce swelling in your legs.  Weigh yourself every day. This helps identify if your body is holding (retaining) fluid that may make your heart and lungs work harder.  Keep all follow-up visits as told by your health care provider. This is important. Contact a health care provider if:  You have more redness, swelling, or pain around any incision.  You have more fluid or blood coming from any incision.  Any incision feels warm to the touch.  You have pus or a bad smell coming from any incision  You have a fever.  You have swelling in your ankles or legs.  You have pain in your legs.  You gain 2 lb (0.9 kg) or more a day.  You are nauseous or you vomit.  You have diarrhea. Get help right away if:  You have chest pain that spreads to your jaw or arms.  You are short of breath.  You have a fast or irregular heartbeat.  You notice a "clicking" in your breastbone (sternum) when you move.  You have numbness or weakness in your arms or legs.  You feel dizzy or light-headed. Summary  After the procedure, it is common to have pain or discomfort in the incision areas.  Do not take baths, swim, or use a hot tub until your health care provider approves.  Gradually increase your activities. You may need physical therapy or cardiac rehabilitation to help strengthen your muscles and build your endurance.  Weigh yourself every day. This helps identify if your body is holding (retaining) fluid that may make your heart and lungs work  harder. This information is not intended to replace advice given to you by your health care provider. Make sure you discuss any questions you have with your health care provider. Document Released: 10/11/2004 Document Revised: 02/11/2016 Document Reviewed: 02/11/2016 Elsevier Interactive Patient Education  2018 [employer].  Aortic Valve Replacement, Care After Refer to this sheet in the next few weeks. These instructions provide you with information about caring for yourself after your procedure. Your health care provider may also give you more specific instructions. Your treatment has been planned according to current medical practices, but problems sometimes occur. Call your health care provider if you have any problems or questions after your procedure. What can I expect after the procedure? After the procedure, it is common to have:  Pain around your incision area.  A small amount of blood or clear fluid coming from your incision.  Follow these instructions at home: Eating and drinking   Follow instructions from your health care provider about eating  or drinking restrictions. ? Limit alcohol intake to no more than 1 drink per day for nonpregnant women and 2 drinks per day for men. One drink equals 12 oz of beer, 5 oz of wine, or 1 oz of hard liquor. ? Limit how much caffeine you drink. Caffeine can affect your heart's rate and rhythm.  Drink enough fluid to keep your urine clear or pale yellow.  Eat a heart-healthy diet. This should include plenty of fresh fruits and vegetables. If you eat meat, it should be lean cuts. Avoid foods that are: ? High in salt, saturated fat, or sugar. ? Canned or highly processed. ? Fried. Activity  Return to your normal activities as told by your health care provider. Ask your health care provider what activities are safe for you.  Exercise regularly once you have recovered, as told by your health care provider.  Avoid sitting for more than 2  hours at a time without moving. Get up and move around at least once every 1-2 hours. This helps to prevent blood clots in the legs.  Do not lift anything that is heavier than 10 lb (4.5 kg) until your health care provider approves.  Avoid pushing or pulling things with your arms until your health care provider approves. This includes pulling on handrails to help you climb stairs. Incision care   Follow instructions from your health care provider about how to take care of your incision. Make sure you: ? Wash your hands with soap and water before you change your bandage (dressing). If soap and water are not available, use hand sanitizer. ? Change your dressing as told by your health care provider. ? Leave stitches (sutures), skin glue, or adhesive strips in place. These skin closures may need to stay in place for 2 weeks or longer. If adhesive strip edges start to loosen and curl up, you may trim the loose edges. Do not remove adhesive strips completely unless your health care provider tells you to do that.  Check your incision area every day for signs of infection. Check for: ? More redness, swelling, or pain. ? More fluid or blood. ? Warmth. ? Pus or a bad smell. Medicines  Take over-the-counter and prescription medicines only as told by your health care provider.  If you were prescribed an antibiotic medicine, take it as told by your health care provider. Do not stop taking the antibiotic even if you start to feel better. Travel  Avoid airplane travel for as long as told by your health care provider.  When you travel, bring a list of your medicines and a record of your medical history with you. Carry your medicines with you. Driving  Ask your health care provider when it is safe for you to drive. Do not drive until your health care provider approves.  Do not drive or operate heavy machinery while taking prescription pain medicine. Lifestyle   Do not use any tobacco products,  such as cigarettes, chewing tobacco, or e-cigarettes. If you need help quitting, ask your health care provider.  Resume sexual activity as told by your health care provider. Do not use medicines for erectile dysfunction unless your health care provider approves, if this applies.  Work with your health care provider to keep your blood pressure and cholesterol under control, and to manage any other heart conditions that you have.  Maintain a healthy weight. General instructions  Do not take baths, swim, or use a hot tub until your health care provider approves.  Do not strain to have a bowel movement.  Avoid crossing your legs while sitting down.  Check your temperature every day for a fever. A fever may be a sign of infection.  If you are a woman and you plan to become pregnant, talk with your health care provider before you become pregnant.  Wear compression stockings if your health care provider instructs you to do this. These stockings help to prevent blood clots and reduce swelling in your legs.  Tell all health care providers who care for you that you have an artificial (prosthetic) aortic valve. If you have or have had heart disease or endocarditis, tell all health care providers about these conditions as well.  Keep all follow-up visits as told by your health care provider. This is important. Contact a health care provider if:  You develop a skin rash.  You experience sudden, unexplained changes in your weight.  You have more redness, swelling, or pain around your incision.  You have more fluid or blood coming from your incision.  Your incision feels warm to the touch.  You have pus or a bad smell coming from your incision.  You have a fever. Get help right away if:  You develop chest pain that is different from the pain coming from your incision.  You develop shortness of breath or difficulty breathing.  You start to feel light-headed. These symptoms may represent  a serious problem that is an emergency. Do not wait to see if the symptoms will go away. Get medical help right away. Call your local emergency services (911 in the [place] Do not drive yourself to the hospital. This information is not intended to replace advice given to you by your health care provider. Make sure you discuss any questions you have with your health care provider. Document Released: 10/10/2004 Document Revised: 08/30/2015 Document Reviewed: 02/25/2015 Elsevier Interactive Patient Education  2017 [employer].

## 2017-06-30 NOTE — Progress Notes (Signed)
  Echocardiogram 2D Echocardiogram has been performed.  Matilde Bash 06/30/2017, 12:26 PM

## 2017-06-30 NOTE — Progress Notes (Addendum)
Progress Note  Patient Name: Todd Frank Date of Encounter: 06/30/2017  Primary Cardiologist: Dr Radford Pax  Subjective   Up in bed, ready to go home if we feel he is stable  Inpatient Medications    Scheduled Meds: . acetaminophen  1,000 mg Oral Q6H  . aspirin EC  81 mg Oral Daily  . atorvastatin  80 mg Oral Daily  . bisacodyl  10 mg Oral Daily   Or  . bisacodyl  10 mg Rectal Daily  . Chlorhexidine Gluconate Cloth  6 each Topical Daily  . docusate sodium  200 mg Oral Daily  . furosemide  40 mg Oral Daily  . mouth rinse  15 mL Mouth Rinse BID  . metoprolol tartrate  25 mg Oral BID  . pantoprazole  40 mg Oral Daily  . potassium chloride  20 mEq Oral Daily  . sodium chloride flush  10-40 mL Intracatheter Q12H  . sodium chloride flush  3 mL Intravenous Q12H   Continuous Infusions: . sodium chloride     PRN Meds: metoprolol tartrate, ondansetron (ZOFRAN) IV, oxyCODONE, sodium chloride flush, sodium chloride flush, traMADol   Vital Signs    Vitals:   06/29/17 1700 06/29/17 1704 06/29/17 1915 06/30/17 0440  BP:  134/89 (!) 142/86 134/84  Pulse:  94 91 83  Resp: 18 (!) 25 (!) 24 18  Temp:  97.7 F (36.5 C) 98.8 F (37.1 C) 98.6 F (37 C)  TempSrc:  Oral Oral Oral  SpO2:  100% 96% 95%  Weight:    193 lb 4.8 oz (87.7 kg)  Height:        Intake/Output Summary (Last 24 hours) at 06/30/2017 1424 Last data filed at 06/30/2017 0900 Gross per 24 hour  Intake 720 ml  Output 1300 ml  Net -580 ml   Filed Weights   06/28/17 0530 06/29/17 0510 06/30/17 0440  Weight: 200 lb 12.8 oz (91.1 kg) 194 lb 8 oz (88.2 kg) 193 lb 4.8 oz (87.7 kg)    Telemetry    NSR, PACs PVCs- Personally Reviewed  ECG    None since 06/28/17 - Personally Reviewed  Physical Exam   GEN: No acute distress.   Neck: No JVD Cardiac: RRR, no murmurs, rubs, or gallops.  Respiratory: decreased breath sounds Lt> Rt base GI: Soft, nontender, non-distended  MS: No edema; No deformity. Neuro:   Nonfocal  Psych: Normal affect   Labs    Chemistry Recent Labs  Lab 06/26/17 0405 06/26/17 1712 06/26/17 1720 06/27/17 0359 06/28/17 0244  NA 137  --  139 133* 138  K 3.7  --  3.6 4.0 3.6  CL 107  --  102 102 106  CO2 22  --   --  25 24  GLUCOSE 136*  --  139* 111* 102*  BUN 11  --  16 15 11   CREATININE 0.76 0.80 0.70 0.75 0.75  CALCIUM 8.0*  --   --  8.2* 8.5*  GFRNONAA >60 >60  --  >60 >60  GFRAA >60 >60  --  >60 >60  ANIONGAP 8  --   --  6 8     Hematology Recent Labs  Lab 06/27/17 0359 06/28/17 0244 06/30/17 0244  WBC 9.1 8.7 6.6  RBC 3.34* 3.59* 3.71*  HGB 10.2* 10.9* 11.1*  HCT 29.6* 31.7* 32.4*  MCV 88.6 88.3 87.3  MCH 30.5 30.4 29.9  MCHC 34.5 34.4 34.3  RDW 13.2 13.1 13.1  PLT 70* 84* 134*  Radiology    CXR 06/28/17- CHEST - 2 VIEW  COMPARISON:  06/27/2017  FINDINGS: Right jugular line has been removed. Chest drains have been removed. Negative for a pneumothorax. Few densities at the left lung base are suggestive for atelectasis and possibly small effusion. Evidence for a small right pleural effusion. Postsurgical changes with a prosthetic aortic valve and epicardial pacer wires. Bridging osteophytes in the thoracic spine.  IMPRESSION: Removal of chest tubes without pneumothorax.  Left basilar atelectasis and probable small pleural effusions.   Cardiac Studies   Echo pending  Patient Profile     62 y.o. male with a hx of 2V CAD and bicuspid aortic valve/severe AS s/p bioprosthetic AVR/CABGx2 this admission on 06/25/17, HTN, HLD, and thrombocytopenia who was seen 06/29/17 for and abnormal EKG post op.  Assessment & Plan    1.  EKG abnormalities : The patient has had some very minor EKG of normalities following his aortic valve replacement.  He developed transient right bundle branch block and now has developed some T wave inversions  2. CAD s/p CABGx2 and tissue AVR 06/25/17 - progressing well. Continue ASA, BB, statin.    3.  Thrombocytopenia - noted pre-operatively as well. He states he was told perhaps his platelets clump and need to be counted manually. He has had post-op worsening of such. Now would not be the time to assess, but could consider in the future drawing with a citrate tube and if still low, consider hematology eval.  4. Essential HTN - Lasix has been titrated today. He is continued on Lopressor. Follow.   Plan: MD to review echo. The pt has a follow up with cardiology scheduled. Possible discharge pending echo.   For questions or updates, please contact [place] Please consult [email or web address] for contact info under Cardiology/STEMI.      Signed, Kerin Ransom, PA-C  06/30/2017, 2:24 PM    Attending Note:   The patient was seen and examined.  Agree with assessment and plan as noted above.  Changes made to the above note as needed.  Patient seen and independently examined with Kerin Ransom, PA .   We discussed all aspects of the encounter. I agree with the assessment and plan as stated above.  1.   ECG abn:   No specific etiology for these post op mild eCG changes.  Had transient RBBB that has resolved. Has some TWI LV function looks good by echo. No pericardial effusion  2.   Status post aortic valve replacement with 2 vessel coronary artery bypass grafting: Stable.  He is not had any episodes of angina.  3.  Essential hypertension: Stable   he appears stable to be discharged.    I have spent a total of 40 minutes with patient reviewing hospital  notes , telemetry, EKGs, labs and examining patient as well as establishing an assessment and plan that was discussed with the patient. > 50% of time was spent in direct patient care.    Thayer Headings, Brooke Bonito., MD, [facility] 06/30/2017, 2:37 PM [address],  [place], [place]

## 2017-07-01 MED FILL — Electrolyte-R (PH 7.4) Solution: INTRAVENOUS | Qty: 4000 | Status: AC

## 2017-07-01 MED FILL — Potassium Chloride Inj 2 mEq/ML: INTRAVENOUS | Qty: 40 | Status: AC

## 2017-07-01 MED FILL — Lidocaine HCl IV Inj 20 MG/ML: INTRAVENOUS | Qty: 10 | Status: AC

## 2017-07-01 MED FILL — Sodium Bicarbonate IV Soln 8.4%: INTRAVENOUS | Qty: 50 | Status: AC

## 2017-07-01 MED FILL — Sodium Chloride IV Soln 0.9%: INTRAVENOUS | Qty: 2000 | Status: AC

## 2017-07-01 MED FILL — Mannitol IV Soln 20%: INTRAVENOUS | Qty: 500 | Status: AC

## 2017-07-01 MED FILL — Heparin Sodium (Porcine) Inj 1000 Unit/ML: INTRAMUSCULAR | Qty: 30 | Status: AC

## 2017-07-01 MED FILL — Magnesium Sulfate Inj 50%: INTRAMUSCULAR | Qty: 10 | Status: AC

## 2017-07-01 MED FILL — Heparin Sodium (Porcine) Inj 1000 Unit/ML: INTRAMUSCULAR | Qty: 20 | Status: AC

## 2017-07-02 ENCOUNTER — Encounter (HOSPITAL_COMMUNITY): Payer: Self-pay | Admitting: Cardiothoracic Surgery

## 2017-07-11 ENCOUNTER — Encounter (HOSPITAL_COMMUNITY): Payer: Self-pay | Admitting: Cardiology

## 2017-07-14 ENCOUNTER — Ambulatory Visit: Payer: Managed Care, Other (non HMO) | Admitting: Physician Assistant

## 2017-07-15 ENCOUNTER — Ambulatory Visit: Payer: Managed Care, Other (non HMO) | Admitting: Nurse Practitioner

## 2017-07-15 ENCOUNTER — Encounter: Payer: Self-pay | Admitting: Nurse Practitioner

## 2017-07-15 ENCOUNTER — Telehealth: Payer: Self-pay

## 2017-07-15 ENCOUNTER — Other Ambulatory Visit: Payer: Self-pay

## 2017-07-15 VITALS — BP 130/90 | HR 75 | Ht 69.5 in | Wt 187.1 lb

## 2017-07-15 DIAGNOSIS — Z952 Presence of prosthetic heart valve: Secondary | ICD-10-CM

## 2017-07-15 DIAGNOSIS — Z951 Presence of aortocoronary bypass graft: Secondary | ICD-10-CM

## 2017-07-15 DIAGNOSIS — G8918 Other acute postprocedural pain: Secondary | ICD-10-CM

## 2017-07-15 MED ORDER — ATORVASTATIN CALCIUM 80 MG PO TABS: 80.0000 mg | ORAL_TABLET | Freq: Every day | ORAL | 3 refills | Status: DC

## 2017-07-15 MED ORDER — TRAMADOL HCL 50 MG PO TABS: ORAL_TABLET | ORAL | 0 refills | Status: DC

## 2017-07-15 MED ORDER — METOPROLOL TARTRATE 25 MG PO TABS: 12.5000 mg | ORAL_TABLET | Freq: Two times a day (BID) | ORAL | 6 refills | Status: DC

## 2017-07-15 NOTE — Patient Instructions (Addendum)
We will be checking the following labs today - BMET & CBC   Medication Instructions:    Continue with your current medicines. BUT  I am cutting the Metoprolol back to just 1/2 a tablet twice a day  I refilled your cholesterol medicine today - to your mail order.   Dr. Thayer Ohm office will be sending your refill in for your Tramadol   Testing/Procedures To Be Arranged:  Echocardiogram  Follow-Up:   See [name] in about a month with fasting labs    Other Special Instructions:   Will send a message to cardiac rehab.      If you need a refill on your cardiac medications before your next appointment, please call your pharmacy.   Call the [place] office at [phone] if you have any questions, problems or concerns.

## 2017-07-15 NOTE — Telephone Encounter (Signed)
-----   Message from Burtis Junes, NP sent at 07/15/2017  3:23 PM EDT ----- Mearl Latin,  Can you put an order in for cardiac rehab at [facility] for this patient of Dr. Theodosia Blender?  Thanks!  [employer]

## 2017-07-15 NOTE — Telephone Encounter (Signed)
RX refill for Tramadol faxed to [facility]

## 2017-07-15 NOTE — Progress Notes (Addendum)
CARDIOLOGY OFFICE NOTE  Date:  07/15/2017    [name] Date of Birth: 01-27-1956 Medical Record #[id number]  PCP:  Olin Hauser, DO  Cardiologist:  Radford Pax    Chief Complaint  Patient presents with  . Cardiac Valve Problem    Post hospital visit -seen for [name]    History of Present Illness: Todd Frank is a 62 y.o. male who presents today for a post hospital visit. Seen for [name].   He has a history of HTN, dyslipidemia, bicuspid AV, diastolic dysfunction, dilated aorta, noted prior small PFO and 2 vessel CAD.   Last seen here in February of 2018.   He presented to [facility] last month after suffering a syncopal episode while jogging. HR was 170 prior to passing out per his watch.  He hit his forehead when he fell and was admitted for observation.  Echocardiogram was obtained and showed an increased gradient across his bicuspid aortic valve which had progressed from previous study, EF was preserved.  Cardiac catheterization was also performed and showed severe 2 vessel CAD.  It was felt he should undergo intervention on his aortic valve with coronary bypass procedure and he was evaluated by Dr. Prescott Gum.   He presented to [facility] on 06/25/2017 and underwent CABG x 2 utilizing LIMA to LAD, and SVG to distal PDA, AVR with a 23 mm [facility] Ease Valve, and endoscopic harvest of greater saphenous vein from his right leg.  Post op course was unremarkable. He had ABL anemia. He also had thrombocytopenia.  He was only on a baby ecasa because of his thrombocytopenia.  He was found to have a possible LBBB post op as well as a variation in QRS complex amplitude at times. Cardiology was consulted. He did have a transient RBBB on his first post-op EKG as well as some nonspecific T wave changes in follow-up, but in discussion with Dr. Acie Fredrickson this was likely due to cardiac edema/changes related to immediate post-op status. He also has had some T wave  inversions but appears to have NO specific etiology for mild EKG changes. An echo was been ordered. Results showed LVEF 50-55%, no pericardial effusion, no regional wall motion abnormalities.   Comes in today. Here with his wife. He is doing quite well. Back sleeping in the bed - using a wedge. He does feel fatigued. Some chest soreness. Still using some tramadol. Bowels working ok. No fever or chills. Worried about a possible retained suture. He is walking a mile three times a day already - doing fine with this. No palpitations. Not short of breath. BP much lower at home - it is typically always higher in the office. His diary is reviewed. He notes a lower HR at home and is worried about the dose of Metoprolol. Very little lightheadedness. Would like to consider cardiac rehab - wants to go to [facility].   Past Medical History:  Diagnosis Date  . Aortic stenosis    a. syncope/severe AS s/p bioprosthetic AVR 06/2017.  . Arthritis   . Bicuspid aortic valve   . Bradycardia 07/23/2015  . Broken clavicle    as child, can still dislocate at times  . CAD (coronary artery disease), native coronary artery    a. s/p 2V CABG at time of AVR 06/2017.  . Carotid bruit    carotid dopplers negative.  Bruit due to heart murmur from AS  . Congenital nevus   . Diastolic dysfunction   .  Dilated aortic root ([place])    14mm by echo 02/2016  . GERD (gastroesophageal reflux disease)   . Headache   . HTN (hypertension)   . Hyperlipidemia    LDL goal < 70  . LVH (left ventricular hypertrophy)   . Melanoma of skin ([place])   . Obesity   . PFO (patent foramen ovale)    small by echo 2014, but not demonstrated during intra-op note from CABG/AVR 06/2017.    Past Surgical History:  Procedure Laterality Date  . AORTIC VALVE REPLACEMENT N/A 06/25/2017   Procedure: AORTIC VALVE REPLACEMENT (AVR) using [employer]a Ease Aortic Valve size 8mm;  Surgeon: Ivin Poot, MD;  Location: [place];  Service: Open Heart Surgery;  Laterality:  N/A;  . arthritic cyst removal  2017   from Lumbar 4-5, dr pool  . CARDIAC CATHETERIZATION N/A 02/20/2016   Procedure: Right/Left Heart Cath and Coronary Angiography;  Surgeon: Belva Crome, MD;  Location: [place] [facility];  Service: Cardiovascular;  Laterality: N/A;  . CORONARY ARTERY BYPASS GRAFT N/A 06/25/2017   Procedure: CORONARY ARTERY BYPASS GRAFTING (CABG) x Two , using left internal mammary artery and right leg greater saphenous vein harvested endoscopically;  Surgeon: Ivin Poot, MD;  Location: [place];  Service: Open Heart Surgery;  Laterality: N/A;  . CORONARY/GRAFT ANGIOGRAPHY N/A 06/02/2017   Procedure: CORONARY/GRAFT ANGIOGRAPHY;  Surgeon: Nelva Bush, MD;  Location: [place] [facility];  Service: Cardiovascular;  Laterality: N/A;  . EYE SURGERY    . MELANOMA EXCISION  2011   left thigh  . RIGHT HEART CATH N/A 06/02/2017   Procedure: RIGHT HEART CATH;  Surgeon: Nelva Bush, MD;  Location: [place] [facility];  Service: Cardiovascular;  Laterality: N/A;  . TEE WITHOUT CARDIOVERSION N/A 06/25/2017   Procedure: TRANSESOPHAGEAL ECHOCARDIOGRAM (TEE);  Surgeon: Prescott Gum, Collier Salina, MD;  Location: [place];  Service: Open Heart Surgery;  Laterality: N/A;  . TONSILLECTOMY       Medications: Current Meds  Medication Sig  . aspirin EC 81 MG tablet Take 81 mg by mouth daily.  Marland Kitchen atorvastatin (LIPITOR) 80 MG tablet Take 1 tablet (80 mg total) by mouth daily.  . Coenzyme Q10 (COQ-10) 200 MG CAPS Take 200 mg by mouth daily.  . fluticasone (FLONASE) 50 MCG/ACT nasal spray Place 2 sprays into both nostrils daily. (Patient taking differently: Place 2 sprays into both nostrils daily as needed (for allergies.). )  . metoprolol tartrate (LOPRESSOR) 25 MG tablet Take 0.5 tablets (12.5 mg total) by mouth 2 (two) times daily.  . [DISCONTINUED] atorvastatin (LIPITOR) 80 MG tablet Take 1 tablet (80 mg total) by mouth daily. Please make overdue yearly appt with [name] before anymore  refills. 2nd attempt  . [DISCONTINUED] metoprolol tartrate (LOPRESSOR) 25 MG tablet Take 1 tablet (25 mg total) by mouth 2 (two) times daily.  . [DISCONTINUED] traMADol (ULTRAM) 50 MG tablet Take 50 mg by mouth every 4-6 hours PRN moderate to severe pain.     Allergies: No Known Allergies  Social History: The patient  reports that he has quit smoking. He has never used smokeless tobacco. He reports that he drinks about 0.6 oz of alcohol per week. He reports that he does not use drugs.   Family History: The patient's family history includes Arthritis in his father; Diabetes in his mother; Heart attack in his unknown relative; Heart disease in his paternal grandmother; Pancreatic cancer in his maternal aunt.   Review of Systems: Please see the history  of present illness.   Otherwise, the review of systems is positive for none.   All other systems are reviewed and negative.   Physical Exam: VS:  BP 130/90 (BP Location: Left Arm, Patient Position: Sitting, Cuff Size: Normal)   Pulse 75   Ht 5' 9.5" (1.765 m)   Wt 187 lb 1.9 oz (84.9 kg)   BMI 27.24 kg/m  .  BMI Body mass index is 27.24 kg/m.  Wt Readings from Last 3 Encounters:  07/15/17 187 lb 1.9 oz (84.9 kg)  06/30/17 193 lb 4.8 oz (87.7 kg)  06/23/17 198 lb 12.8 oz (90.2 kg)    General: Pleasant. Well developed, well nourished and in no acute distress.   HEENT: Normal.  Neck: Supple, no JVD, carotid bruits, or masses noted.  Cardiac: Regular rate and rhythm. No murmurs, rubs, or gallops. His incision looks great. There was a crusty like ?suture from the right chest tube that was removed. No edema.  Respiratory:  Lungs are clear to auscultation bilaterally with normal work of breathing.  GI: Soft and nontender.  MS: No deformity or atrophy. Gait and ROM intact.  Skin: Warm and dry. Color is normal.  Neuro:  Strength and sensation are intact and no gross focal deficits noted.  Psych: Alert, appropriate and with normal  affect.   LABORATORY DATA:  EKG:  EKG is ordered today. This demonstrates NSR today - diffuse T wave changes.  Lab Results  Component Value Date   WBC 6.6 06/30/2017   HGB 11.1 (L) 06/30/2017   HCT 32.4 (L) 06/30/2017   PLT 134 (L) 06/30/2017   GLUCOSE 102 (H) 06/28/2017   CHOL 118 02/10/2017   TRIG 55 02/10/2017   HDL 63 02/10/2017   LDLCALC 44 02/10/2017   ALT 27 06/23/2017   AST 27 06/23/2017   NA 138 06/28/2017   K 3.6 06/28/2017   CL 106 06/28/2017   CREATININE 0.75 06/28/2017   BUN 11 06/28/2017   CO2 24 06/28/2017   TSH 1.226 05/31/2017   INR 1.54 06/25/2017   HGBA1C 4.9 06/23/2017     BNP (last 3 results) No results for input(s): BNP in the last 8760 hours.  ProBNP (last 3 results) No results for input(s): PROBNP in the last 8760 hours.   Other Studies Reviewed Today:  Significant Diagnostic Studies: angiography:   1. Significant 2-vessel coronary artery disease involving the LAD and distal RCA/PDA, not significantly changed since prior catheterization in 02/2016. 2. Normal left heart, right heart, and pulmonary artery pressures. 3. Normal Fick cardiac output/index. 4. Heavily calcified aortic valve with limited mobility on fluoroscopic images.  Echocardiogram:   Septum: No Patent Foramen Ovale present.  Left atrium: Patent foramen ovale not present.  Left atrium: with late bubble crossing consistent with intrahepatic or intrapulmonary.  Aortic valve: The valve is probable trileaflet. Severe valve thickening present. Severe valve calcification present. Severely decreased leaflet separation. Severe stenosis. Mild regurgitation.  Mitral valve: No leaflet thickening and calcification present. Mild regurgitation.  Right ventricle: Normal cavity size, wall thickness and ejection fraction. No thrombus present. No mass present.  Tricuspid valve: Mild regurgitation. The tricuspid valve regurgitation jet is directed toward the right atrial free  wall.  Pulmonic valve: Trace regurgitation.  Treatments: surgery:   1. Aortic valve replaced with a 23-mm pericardial [facility] Ease valve. 2. Coronary artery bypass grafting x2 (left internal mammary artery to LAD, saphenous vein graft to distal posterior descending). 3. Endoscopic harvest of right leg greater saphenous vein.  CTA CHEST IMPRESSION 06/2017: Aneurysmal dilatation of the ascending aorta is not significantly changed measuring 4.3 cm today and previously measuring 4.2 cm. Advanced aortic valvular calcifications are noted. A recent echocardiogram demonstrates severe aortic stenosis. Peak gradient was recorded at 74 mm Hg. Recommend annual imaging followup by CTA or MRA. This recommendation follows 2010 ACCF/AHA/AATS/ACR/ASA/SCA/SCAI/SIR/STS/SVM Guidelines for the Diagnosis and Management of Patients with Thoracic Aortic Disease. Circulation. 2010; 121: F093-A355   Electronically Signed   By: Marybelle Killings M.D.   On: 06/19/2017 08:32  Assessment/Plan: 1. S/P CABG and AVR - he is doing well. Lab today. Cutting Metoprolol in half. Will make referral to cardiac rehab. He is already walking 3 miles per day - would not advance this at this time. Reminded of lifting restrictions. Reminded of SBE as well. Overall, he looks to be doing very well. Will get his echo rechecked. Tramadol being refilled by Dr. Lucianne Lei Trigt's office.   2. Post op abnormal EKG - still with diffuse T wave changes.   3. History of bradycardia - HR already tracking lower at home - will cut metoprolol in half  4. HLD - refilled his statin today. Fasting labs when he returns to see [name].   5. Thoracic aneurysm - he will need continued surveillance  6. Post op anemia/thrombocytopenia - labs to be rechecked.   Current medicines are reviewed with the patient today.  The patient does not have concerns regarding medicines other than what has been noted above.  The following changes have  been made:  See above.  Labs/ tests ordered today include:    Orders Placed This Encounter  Procedures  . Basic metabolic panel  . CBC  . EKG 12-Lead  . ECHOCARDIOGRAM COMPLETE     Disposition:   FU with [name] in one month with fasting labs.   Patient is agreeable to this plan and will call if any problems develop in the interim.   SignedTruitt Merle, NP  07/15/2017 3:24 PM  [facility] [address] [place], [place]  [address] Phone: [phone] Fax: [phone]

## 2017-07-15 NOTE — Telephone Encounter (Signed)
Order placed for cardiac rehab per Cecille Rubin, NP.

## 2017-07-16 LAB — BASIC METABOLIC PANEL
BUN/Creatinine Ratio: 19 (ref 10–24)
BUN: 15 mg/dL (ref 8–27)
CO2: 24 mmol/L (ref 20–29)
Calcium: 9.4 mg/dL (ref 8.6–10.2)
Chloride: 102 mmol/L (ref 96–106)
Creatinine, Ser: 0.81 mg/dL (ref 0.76–1.27)
GFR calc Af Amer: 110 mL/min/{1.73_m2} (ref >59)
GFR calc non Af Amer: 95 mL/min/{1.73_m2} (ref >59)
Glucose: 90 mg/dL (ref 65–99)
Potassium: 5.3 mmol/L — ABNORMAL HIGH (ref 3.5–5.2)
Sodium: 139 mmol/L (ref 134–144)

## 2017-07-16 LAB — CBC
Hematocrit: 41 % (ref 37.5–51.0)
Hemoglobin: 13.3 g/dL (ref 13.0–17.7)
MCH: 29.4 pg (ref 26.6–33.0)
MCHC: 32.4 g/dL (ref 31.5–35.7)
MCV: 91 fL (ref 79–97)
Platelets: 298 10*3/uL (ref 150–379)
RBC: 4.52 x10E6/uL (ref 4.14–5.80)
RDW: 13.4 % (ref 12.3–15.4)
WBC: 6.1 10*3/uL (ref 3.4–10.8)

## 2017-07-17 ENCOUNTER — Other Ambulatory Visit: Payer: Managed Care, Other (non HMO)

## 2017-07-17 ENCOUNTER — Other Ambulatory Visit: Payer: Self-pay

## 2017-07-17 ENCOUNTER — Ambulatory Visit (HOSPITAL_COMMUNITY): Payer: Managed Care, Other (non HMO) | Attending: Cardiology

## 2017-07-17 DIAGNOSIS — E785 Hyperlipidemia, unspecified: Secondary | ICD-10-CM | POA: Insufficient documentation

## 2017-07-17 DIAGNOSIS — I2510 Atherosclerotic heart disease of native coronary artery without angina pectoris: Secondary | ICD-10-CM | POA: Insufficient documentation

## 2017-07-17 DIAGNOSIS — Z8249 Family history of ischemic heart disease and other diseases of the circulatory system: Secondary | ICD-10-CM | POA: Diagnosis not present

## 2017-07-17 DIAGNOSIS — Z951 Presence of aortocoronary bypass graft: Secondary | ICD-10-CM | POA: Insufficient documentation

## 2017-07-17 DIAGNOSIS — Z952 Presence of prosthetic heart valve: Secondary | ICD-10-CM | POA: Diagnosis not present

## 2017-07-17 DIAGNOSIS — I350 Nonrheumatic aortic (valve) stenosis: Secondary | ICD-10-CM | POA: Insufficient documentation

## 2017-07-17 DIAGNOSIS — J90 Pleural effusion, not elsewhere classified: Secondary | ICD-10-CM | POA: Diagnosis not present

## 2017-07-17 DIAGNOSIS — Z87891 Personal history of nicotine dependence: Secondary | ICD-10-CM | POA: Insufficient documentation

## 2017-07-17 DIAGNOSIS — I10 Essential (primary) hypertension: Secondary | ICD-10-CM | POA: Diagnosis not present

## 2017-07-27 ENCOUNTER — Encounter: Payer: Self-pay | Admitting: *Deleted

## 2017-07-27 ENCOUNTER — Encounter: Payer: Managed Care, Other (non HMO) | Attending: Cardiology | Admitting: *Deleted

## 2017-07-27 VITALS — Ht 70.5 in | Wt 185.5 lb

## 2017-07-27 DIAGNOSIS — Z9889 Other specified postprocedural states: Secondary | ICD-10-CM | POA: Diagnosis not present

## 2017-07-27 DIAGNOSIS — I2510 Atherosclerotic heart disease of native coronary artery without angina pectoris: Secondary | ICD-10-CM | POA: Insufficient documentation

## 2017-07-27 DIAGNOSIS — Z79899 Other long term (current) drug therapy: Secondary | ICD-10-CM | POA: Insufficient documentation

## 2017-07-27 DIAGNOSIS — I10 Essential (primary) hypertension: Secondary | ICD-10-CM | POA: Diagnosis not present

## 2017-07-27 DIAGNOSIS — Z7982 Long term (current) use of aspirin: Secondary | ICD-10-CM | POA: Insufficient documentation

## 2017-07-27 DIAGNOSIS — Z87891 Personal history of nicotine dependence: Secondary | ICD-10-CM | POA: Diagnosis not present

## 2017-07-27 DIAGNOSIS — Z79891 Long term (current) use of opiate analgesic: Secondary | ICD-10-CM | POA: Insufficient documentation

## 2017-07-27 NOTE — Progress Notes (Signed)
Cardiac Individual Treatment Plan  Patient Details  Name: Todd Frank MRN: [id number] Date of Birth: 07/06/1955 Referring Provider:     Cardiac Rehab from 07/27/2017 in [facility] Cardiac and Pulmonary Rehab  Referring Provider  [name] MD      Initial Encounter Date:    Cardiac Rehab from 07/27/2017 in [facility] Cardiac and Pulmonary Rehab  Date  07/27/17  Referring Provider  [name] MD      Visit Diagnosis: S/P aortic valve repair  Patient's Home Medications on Admission:  Current Outpatient Medications:  .  aspirin EC 81 MG tablet, Take 81 mg by mouth daily., Disp: , Rfl:  .  atorvastatin (LIPITOR) 80 MG tablet, Take 1 tablet (80 mg total) by mouth daily., Disp: 90 tablet, Rfl: 3 .  Coenzyme Q10 (COQ-10) 200 MG CAPS, Take 200 mg by mouth daily., Disp: , Rfl:  .  fluticasone (FLONASE) 50 MCG/ACT nasal spray, Place 2 sprays into both nostrils daily. (Patient taking differently: Place 2 sprays into both nostrils daily as needed (for allergies.). ), Disp: 16 g, Rfl: 12 .  metoprolol tartrate (LOPRESSOR) 25 MG tablet, Take 0.5 tablets (12.5 mg total) by mouth 2 (two) times daily., Disp: 60 tablet, Rfl: 6 .  traMADol (ULTRAM) 50 MG tablet, Take 50 mg by mouth every 4-6 hours PRN moderate to severe pain. (Patient not taking: Reported on 07/27/2017), Disp: 30 tablet, Rfl: 0  Past Medical History: Past Medical History:  Diagnosis Date  . Aortic stenosis    a. syncope/severe AS s/p bioprosthetic AVR 06/2017.  . Arthritis   . Bicuspid aortic valve   . Bradycardia 07/23/2015  . Broken clavicle    as child, can still dislocate at times  . CAD (coronary artery disease), native coronary artery    a. s/p 2V CABG at time of AVR 06/2017.  . Carotid bruit    carotid dopplers negative.  Bruit due to heart murmur from AS  . Congenital nevus   . Diastolic dysfunction   . Dilated aortic root ([place])    9m by echo 02/2016  . GERD (gastroesophageal reflux disease)   . Headache   . HTN  (hypertension)   . Hyperlipidemia    LDL goal < 70  . LVH (left ventricular hypertrophy)   . [place]   . Obesity   . PFO (patent foramen ovale)    small by echo 2014, but not demonstrated during intra-op note from CABG/AVR 06/2017.    Tobacco Use: Social History   Tobacco Use  Smoking Status Former Smoker  . Last attempt to quit: 1977  . Years since quitting: 42.3  Smokeless Tobacco Never Used  Tobacco Comment   quit in 78, teenager    Labs: Recent Review Flowsheet Data    Labs for ITP Cardiac and Pulmonary Rehab Latest Ref Rng & Units 06/25/2017 06/25/2017 06/25/2017 06/25/2017 06/26/2017   Cholestrol 100 - 199 mg/dL - - - - -   LDLCALC 0 - 99 mg/dL - - - - -   HDL >39 mg/dL - - - - -   Trlycerides 0 - 149 mg/dL - - - - -   Hemoglobin A1c 4.8 - 5.6 % - - - - -   PHART 7.350 - 7.450 7.366 7.349(L) - 7.311(L) -   PCO2ART 32.0 - 48.0 mmHg 40.3 39.2 - 47.0 -   HCO3 20.0 - 28.0 mmol/L 23.5 21.6 - 23.8 -   TCO2 22 - 32 mmol/L _0 23  ACIDBASEDEF 0.0 - 2.0 mmol/L 2.0 4.0(H) - 3.0(H) -   O2SAT % 97.0 98.0 - 98.0 -       Exercise Target Goals: Date: 07/27/17  Exercise Program Goal: Individual exercise prescription set using results from initial 6 min walk test and THRR while considering  patient's activity barriers and safety.   Exercise Prescription Goal: Initial exercise prescription builds to 30-45 minutes a day of aerobic activity, 2-3 days per week.  Home exercise guidelines will be given to patient during program as part of exercise prescription that the participant will acknowledge.  Activity Barriers & Risk Stratification: Activity Barriers & Cardiac Risk Stratification - 07/27/17 1247      Activity Barriers & Cardiac Risk Stratification   Activity Barriers  Joint Problems R shoulder, history of broken collar bone    Cardiac Risk Stratification  Moderate       6 Minute Walk: 6 Minute Walk    Row Name 07/27/17 1406         6 Minute Walk    Phase  Initial     Distance  1550 feet     Walk Time  6 minutes     # of Rest Breaks  0     MPH  2.94     METS  3.93     RPE  10     VO2 Peak  13.74     Symptoms  No     Resting HR  60 bpm     Resting BP  134/74     Resting Oxygen Saturation   99 %     Exercise Oxygen Saturation  during 6 min walk  98 %     Max Ex. HR  94 bpm     Max Ex. BP  142/78     2 Minute Post BP  126/68        Oxygen Initial Assessment:   Oxygen Re-Evaluation:   Oxygen Discharge (Final Oxygen Re-Evaluation):   Initial Exercise Prescription: Initial Exercise Prescription - 07/27/17 1400      Date of Initial Exercise RX and Referring Provider   Date  07/27/17    Referring Provider  [name] MD      Treadmill   MPH  2.9    Grade  1    Minutes  15    METs  3.62      REL-XR   Level  2    Speed  50    Minutes  15    METs  3.6      T5 Nustep   Level  3    SPM  80    Minutes  15    METs  3.6      Prescription Details   Frequency (times per week)  3    Duration  Progress to 45 minutes of aerobic exercise without signs/symptoms of physical distress      Intensity   THRR 40-80% of Max Heartrate  99-138    Ratings of Perceived Exertion  11-13    Perceived Dyspnea  0-4      Progression   Progression  Continue to progress workloads to maintain intensity without signs/symptoms of physical distress.      Resistance Training   Training Prescription  Yes    Weight  4 lbs    Reps  10-15       Perform Capillary Blood Glucose checks as needed.  Exercise Prescription Changes: Exercise Prescription Changes    Row Name  07/27/17 1400             Response to Exercise   Blood Pressure (Admit)  134/74       Blood Pressure (Exercise)  142/78       Blood Pressure (Exit)  126/68       Heart Rate (Admit)  60 bpm       Heart Rate (Exercise)  94 bpm       Heart Rate (Exit)  61 bpm       Oxygen Saturation (Admit)  99 %       Oxygen Saturation (Exercise)  98 %       Rating of  Perceived Exertion (Exercise)  10       Symptoms  none       Comments  walk test results          Exercise Comments:   Exercise Goals and Review: Exercise Goals    Row Name 07/27/17 1423             Exercise Goals   Increase Physical Activity  Yes       Intervention  Provide advice, education, support and counseling about physical activity/exercise needs.;Develop an individualized exercise prescription for aerobic and resistive training based on initial evaluation findings, risk stratification, comorbidities and participant's personal goals.       Expected Outcomes  Short Term: Attend rehab on a regular basis to increase amount of physical activity.;Long Term: Add in home exercise to make exercise part of routine and to increase amount of physical activity.;Long Term: Exercising regularly at least 3-5 days a week.       Increase Strength and Stamina  Yes       Intervention  Provide advice, education, support and counseling about physical activity/exercise needs.;Develop an individualized exercise prescription for aerobic and resistive training based on initial evaluation findings, risk stratification, comorbidities and participant's personal goals.       Expected Outcomes  Short Term: Increase workloads from initial exercise prescription for resistance, speed, and METs.;Short Term: Perform resistance training exercises routinely during rehab and add in resistance training at home;Long Term: Improve cardiorespiratory fitness, muscular endurance and strength as measured by increased METs and functional capacity (6MWT)       Able to understand and use rate of perceived exertion (RPE) scale  Yes       Intervention  Provide education and explanation on how to use RPE scale       Expected Outcomes  Short Term: Able to use RPE daily in rehab to express subjective intensity level;Long Term:  Able to use RPE to guide intensity level when exercising independently       Knowledge and understanding of  Target Heart Rate Range (THRR)  Yes       Intervention  Provide education and explanation of THRR including how the numbers were predicted and where they are located for reference       Expected Outcomes  Short Term: Able to state/look up THRR;Short Term: Able to use daily as guideline for intensity in rehab;Long Term: Able to use THRR to govern intensity when exercising independently       Able to check pulse independently  Yes       Intervention  Provide education and demonstration on how to check pulse in carotid and radial arteries.;Review the importance of being able to check your own pulse for safety during independent exercise       Expected Outcomes  Short Term: Able to  explain why pulse checking is important during independent exercise;Long Term: Able to check pulse independently and accurately       Understanding of Exercise Prescription  Yes       Intervention  Provide education, explanation, and written materials on patient's individual exercise prescription       Expected Outcomes  Short Term: Able to explain program exercise prescription;Long Term: Able to explain home exercise prescription to exercise independently          Exercise Goals Re-Evaluation :   Discharge Exercise Prescription (Final Exercise Prescription Changes): Exercise Prescription Changes - 07/27/17 1400      Response to Exercise   Blood Pressure (Admit)  134/74    Blood Pressure (Exercise)  142/78    Blood Pressure (Exit)  126/68    Heart Rate (Admit)  60 bpm    Heart Rate (Exercise)  94 bpm    Heart Rate (Exit)  61 bpm    Oxygen Saturation (Admit)  99 %    Oxygen Saturation (Exercise)  98 %    Rating of Perceived Exertion (Exercise)  10    Symptoms  none    Comments  walk test results       Nutrition:  Target Goals: Understanding of nutrition guidelines, daily intake of sodium '1500mg'$ , cholesterol '200mg'$ , calories 30% from fat and 7% or less from saturated fats, daily to have 5 or more servings of  fruits and vegetables.  Biometrics: Pre Biometrics - 07/27/17 1423      Pre Biometrics   Height  5' 10.5" (1.791 m)    Weight  185 lb 8 oz (84.1 kg)    Waist Circumference  33 inches    Hip Circumference  38.5 inches    Waist to Hip Ratio  0.86 %    BMI (Calculated)  26.23    Single Leg Stand  30 seconds        Nutrition Therapy Plan and Nutrition Goals: Nutrition Therapy & Goals - 07/27/17 1241      Intervention Plan   Intervention  Nutrition handout(s) given to patient.;Prescribe, educate and counsel regarding individualized specific dietary modifications aiming towards targeted core components such as weight, hypertension, lipid management, diabetes, heart failure and other comorbidities.    Expected Outcomes  Short Term Goal: Understand basic principles of dietary content, such as calories, fat, sodium, cholesterol and nutrients.;Short Term Goal: A plan has been developed with personal nutrition goals set during dietitian appointment.;Long Term Goal: Adherence to prescribed nutrition plan.       Nutrition Assessments: Nutrition Assessments - 07/27/17 1241      MEDFICTS Scores   Pre Score  18       Nutrition Goals Re-Evaluation:   Nutrition Goals Discharge (Final Nutrition Goals Re-Evaluation):   Psychosocial: Target Goals: Acknowledge presence or absence of significant depression and/or stress, maximize coping skills, provide positive support system. Participant is able to verbalize types and ability to use techniques and skills needed for reducing stress and depression.   Initial Review & Psychosocial Screening: Initial Psych Review & Screening - 07/27/17 1242      Initial Review   Current issues with  Current Stress Concerns Mr. Alderman has some concerns about his heart post surgery, but is excited to get back to exercising and his routine. He gets a little nervous with the different "twinges and weird feelings coming from the heart"      [place]?  Yes spouse, sister, friends  Screening Interventions   Interventions  Encouraged to exercise;Program counselor consult;To provide support and resources with identified psychosocial needs    Expected Outcomes  Short Term goal: Utilizing psychosocial counselor, staff and physician to assist with identification of specific Stressors or current issues interfering with healing process. Setting desired goal for each stressor or current issue identified.;Long Term Goal: Stressors or current issues are controlled or eliminated.;Short Term goal: Identification and review with participant of any Quality of Life or Depression concerns found by scoring the questionnaire.;Long Term goal: The participant improves quality of Life and PHQ9 Scores as seen by post scores and/or verbalization of changes       Quality of Life Scores:  Quality of Life - 07/27/17 1244      Quality of Life Scores   Health/Function Pre  26.03 %    Socioeconomic Pre  28.93 %    Psych/Spiritual Pre  27.58 %    Family Pre  25.5 %    GLOBAL Pre  26.89 %      Scores of 19 and below usually indicate a poorer quality of life in these areas.  A difference of  2-3 points is a clinically meaningful difference.  A difference of 2-3 points in the total score of the Quality of Life Index has been associated with significant improvement in overall quality of life, self-image, physical symptoms, and general health in studies assessing change in quality of life.  PHQ-9: Recent Review Flowsheet Data    Depression screen [facility] 2/9 07/27/2017 02/06/2017 01/09/2016 02/02/2015   Decreased Interest 0 0 0 0   Down, Depressed, Hopeless 0 0 0 0   PHQ - 2 Score 0 0 0 0   Altered sleeping 1 - - -   Tired, decreased energy 0 - - -   Change in appetite 0 - - -   Feeling bad or failure about yourself  0 - - -   Trouble concentrating 0 - - -   Moving slowly or fidgety/restless 0 - - -   Suicidal thoughts 0 - - -   PHQ-9 Score 1 - - -    Difficult doing work/chores Not difficult at all - - -     Interpretation of Total Score  Total Score Depression Severity:  1-4 = Minimal depression, 5-9 = Mild depression, 10-14 = Moderate depression, 15-19 = Moderately severe depression, 20-27 = Severe depression   Psychosocial Evaluation and Intervention:   Psychosocial Re-Evaluation:   Psychosocial Discharge (Final Psychosocial Re-Evaluation):   Vocational Rehabilitation: Provide vocational rehab assistance to qualifying candidates.   Vocational Rehab Evaluation & Intervention: Vocational Rehab - 07/27/17 1245      Initial Vocational Rehab Evaluation & Intervention   Assessment shows need for Vocational Rehabilitation  No       Education: Education Goals: Education classes will be provided on a variety of topics geared toward better understanding of heart health and risk factor modification. Participant will state understanding/return demonstration of topics presented as noted by education test scores.  Learning Barriers/Preferences: Learning Barriers/Preferences - 07/27/17 1245      Learning Barriers/Preferences   Learning Barriers  None    Learning Preferences  None       Education Topics:  AED/CPR: - Group verbal and written instruction with the use of models to demonstrate the basic use of the AED with the basic ABC's of resuscitation.   General Nutrition Guidelines/Fats and Fiber: -Group instruction provided by verbal, written material, models and posters to present the general  guidelines for heart healthy nutrition. Gives an explanation and review of dietary fats and fiber.   Controlling Sodium/Reading Food Labels: -Group verbal and written material supporting the discussion of sodium use in heart healthy nutrition. Review and explanation with models, verbal and written materials for utilization of the food label.   Exercise Physiology & General Exercise Guidelines: - Group verbal and written  instruction with models to review the exercise physiology of the cardiovascular system and associated critical values. Provides general exercise guidelines with specific guidelines to those with heart or lung disease.    Aerobic Exercise & Resistance Training: - Gives group verbal and written instruction on the various components of exercise. Focuses on aerobic and resistive training programs and the benefits of this training and how to safely progress through these programs..   Flexibility, Balance, Mind/Body Relaxation: Provides group verbal/written instruction on the benefits of flexibility and balance training, including mind/body exercise modes such as yoga, pilates and tai chi.  Demonstration and skill practice provided.   Stress and Anxiety: - Provides group verbal and written instruction about the health risks of elevated stress and causes of high stress.  Discuss the correlation between heart/lung disease and anxiety and treatment options. Review healthy ways to manage with stress and anxiety.   Depression: - Provides group verbal and written instruction on the correlation between heart/lung disease and depressed mood, treatment options, and the stigmas associated with seeking treatment.   Anatomy & Physiology of the Heart: - Group verbal and written instruction and models provide basic cardiac anatomy and physiology, with the coronary electrical and arterial systems. Review of Valvular disease and Heart Failure   Cardiac Procedures: - Group verbal and written instruction to review commonly prescribed medications for heart disease. Reviews the medication, class of the drug, and side effects. Includes the steps to properly store meds and maintain the prescription regimen. (beta blockers and nitrates)   Cardiac Medications I: - Group verbal and written instruction to review commonly prescribed medications for heart disease. Reviews the medication, class of the drug, and side  effects. Includes the steps to properly store meds and maintain the prescription regimen.   Cardiac Medications II: -Group verbal and written instruction to review commonly prescribed medications for heart disease. Reviews the medication, class of the drug, and side effects. (all other drug classes)    Go Sex-Intimacy & Heart Disease, Get SMART - Goal Setting: - Group verbal and written instruction through game format to discuss heart disease and the return to sexual intimacy. Provides group verbal and written material to discuss and apply goal setting through the application of the S.M.A.R.T. Method.   Other Matters of the Heart: - Provides group verbal, written materials and models to describe Stable Angina and Peripheral Artery. Includes description of the disease process and treatment options available to the cardiac patient.   Exercise & Equipment Safety: - Individual verbal instruction and demonstration of equipment use and safety with use of the equipment.   Cardiac Rehab from 07/27/2017 in [facility] Cardiac and Pulmonary Rehab  Date  07/27/17  Educator  [facility]  Instruction Review Code  1- Verbalizes Understanding      Infection Prevention: - Provides verbal and written material to individual with discussion of infection control including proper hand washing and proper equipment cleaning during exercise session.   Cardiac Rehab from 07/27/2017 in [facility] Cardiac and Pulmonary Rehab  Date  07/27/17  Educator  [facility]  Instruction Review Code  1- Verbalizes Understanding  Falls Prevention: - Provides verbal and written material to individual with discussion of falls prevention and safety.   Cardiac Rehab from 07/27/2017 in [facility] Cardiac and Pulmonary Rehab  Date  07/27/17  Educator  [facility]  Instruction Review Code  1- Verbalizes Understanding      Diabetes: - Individual verbal and written instruction to review signs/symptoms of diabetes, desired ranges of glucose level fasting, after  meals and with exercise. Acknowledge that pre and post exercise glucose checks will be done for 3 sessions at entry of program.   Know Your Numbers and Risk Factors: -Group verbal and written instruction about important numbers in your health.  Discussion of what are risk factors and how they play a role in the disease process.  Review of Cholesterol, Blood Pressure, Diabetes, and BMI and the role they play in your overall health.   Sleep Hygiene: -Provides group verbal and written instruction about how sleep can affect your health.  Define sleep hygiene, discuss sleep cycles and impact of sleep habits. Review good sleep hygiene tips.    Other: -Provides group and verbal instruction on various topics (see comments)   Knowledge Questionnaire Score: Knowledge Questionnaire Score - 07/27/17 1245      Knowledge Questionnaire Score   Pre Score  28/28       Core Components/Risk Factors/Patient Goals at Admission: Personal Goals and Risk Factors at Admission - 07/27/17 1237      Core Components/Risk Factors/Patient Goals on Admission    Weight Management  Yes;Weight Loss    Intervention  Weight Management: Develop a combined nutrition and exercise program designed to reach desired caloric intake, while maintaining appropriate intake of nutrient and fiber, sodium and fats, and appropriate energy expenditure required for the weight goal.;Weight Management: Provide education and appropriate resources to help participant work on and attain dietary goals.;Weight Management/Obesity: Establish reasonable short term and long term weight goals.    Admit Weight  185 lb (83.9 kg)    Goal Weight: Short Term  180 lb (81.6 kg)    Goal Weight: Long Term  160 lb (72.6 kg)    Expected Outcomes  Short Term: Continue to assess and modify interventions until short term weight is achieved;Long Term: Adherence to nutrition and physical activity/exercise program aimed toward attainment of established weight  goal;Weight Loss: Understanding of general recommendations for a balanced deficit meal plan, which promotes 1-2 lb weight loss per week and includes a negative energy balance of [phone] kcal/d;Understanding recommendations for meals to include 15-35% energy as protein, 25-35% energy from fat, 35-60% energy from carbohydrates, less than 265m of dietary cholesterol, 20-35 gm of total fiber daily;Understanding of distribution of calorie intake throughout the day with the consumption of 4-5 meals/snacks    Hypertension  Yes    Intervention  Provide education on lifestyle modifcations including regular physical activity/exercise, weight management, moderate sodium restriction and increased consumption of fresh fruit, vegetables, and low fat dairy, alcohol moderation, and smoking cessation.;Monitor prescription use compliance.    Expected Outcomes  Short Term: Continued assessment and intervention until BP is < 140/920mHG in hypertensive participants. < 130/80104mG in hypertensive participants with diabetes, heart failure or chronic kidney disease.;Long Term: Maintenance of blood pressure at goal levels.    Lipids  Yes    Intervention  Provide education and support for participant on nutrition & aerobic/resistive exercise along with prescribed medications to achieve LDL <62m35mDL >40mg46m Expected Outcomes  Short Term: Participant states understanding of desired cholesterol values  and is compliant with medications prescribed. Participant is following exercise prescription and nutrition guidelines.;Long Term: Cholesterol controlled with medications as prescribed, with individualized exercise RX and with personalized nutrition plan. Value goals: LDL < '70mg'$ , HDL > 40 mg.       Core Components/Risk Factors/Patient Goals Review:    Core Components/Risk Factors/Patient Goals at Discharge (Final Review):    ITP Comments: ITP Comments    Row Name 07/27/17 1231           ITP Comments  Med Review  completed. Initial ITP created. Diagnosis can be found in [facility] 06/25/17          Comments: Initial ITP

## 2017-07-27 NOTE — Patient Instructions (Signed)
Patient Instructions  Patient Details  Name: Todd Frank MRN: [id number] Date of Birth: Feb 12, 1956 Referring Provider:  [name], MD  Below are your personal goals for exercise, nutrition, and risk factors. Our goal is to help you stay on track towards obtaining and maintaining these goals. We will be discussing your progress on these goals with you throughout the program.  Initial Exercise Prescription: Initial Exercise Prescription - 07/27/17 1400      Date of Initial Exercise RX and Referring Provider   Date  07/27/17    Referring Provider  [name] MD      Treadmill   MPH  2.9    Grade  1    Minutes  15    METs  3.62      REL-XR   Level  2    Speed  50    Minutes  15    METs  3.6      T5 Nustep   Level  3    SPM  80    Minutes  15    METs  3.6      Prescription Details   Frequency (times per week)  3    Duration  Progress to 45 minutes of aerobic exercise without signs/symptoms of physical distress      Intensity   THRR 40-80% of Max Heartrate  99-138    Ratings of Perceived Exertion  11-13    Perceived Dyspnea  0-4      Progression   Progression  Continue to progress workloads to maintain intensity without signs/symptoms of physical distress.      Resistance Training   Training Prescription  Yes    Weight  4 lbs    Reps  10-15       Exercise Goals: Frequency: Be able to perform aerobic exercise two to three times per week in program working toward 2-5 days per week of home exercise.  Intensity: Work with a perceived exertion of 11 (fairly light) - 15 (hard) while following your exercise prescription.  We will make changes to your prescription with you as you progress through the program.   Duration: Be able to do 30 to 45 minutes of continuous aerobic exercise in addition to a 5 minute warm-up and a 5 minute cool-down routine.   Nutrition Goals: Your personal nutrition goals will be established when you do your nutrition analysis with  the dietician.  The following are general nutrition guidelines to follow: Cholesterol < 200mg /day Sodium < 1500mg /day Fiber: Men over 50 yrs - 30 grams per day  Personal Goals: Personal Goals and Risk Factors at Admission - 07/27/17 1237      Core Components/Risk Factors/Patient Goals on Admission    Weight Management  Yes;Weight Loss    Intervention  Weight Management: Develop a combined nutrition and exercise program designed to reach desired caloric intake, while maintaining appropriate intake of nutrient and fiber, sodium and fats, and appropriate energy expenditure required for the weight goal.;Weight Management: Provide education and appropriate resources to help participant work on and attain dietary goals.;Weight Management/Obesity: Establish reasonable short term and long term weight goals.    Admit Weight  185 lb (83.9 kg)    Goal Weight: Short Term  180 lb (81.6 kg)    Goal Weight: Long Term  160 lb (72.6 kg)    Expected Outcomes  Short Term: Continue to assess and modify interventions until short term weight is achieved;Long Term: Adherence to nutrition and physical activity/exercise program  aimed toward attainment of established weight goal;Weight Loss: Understanding of general recommendations for a balanced deficit meal plan, which promotes 1-2 lb weight loss per week and includes a negative energy balance of [phone] kcal/d;Understanding recommendations for meals to include 15-35% energy as protein, 25-35% energy from fat, 35-60% energy from carbohydrates, less than 200mg  of dietary cholesterol, 20-35 gm of total fiber daily;Understanding of distribution of calorie intake throughout the day with the consumption of 4-5 meals/snacks    Hypertension  Yes    Intervention  Provide education on lifestyle modifcations including regular physical activity/exercise, weight management, moderate sodium restriction and increased consumption of fresh fruit, vegetables, and low fat dairy, alcohol  moderation, and smoking cessation.;Monitor prescription use compliance.    Expected Outcomes  Short Term: Continued assessment and intervention until BP is < 140/17mm HG in hypertensive participants. < 130/38mm HG in hypertensive participants with diabetes, heart failure or chronic kidney disease.;Long Term: Maintenance of blood pressure at goal levels.    Lipids  Yes    Intervention  Provide education and support for participant on nutrition & aerobic/resistive exercise along with prescribed medications to achieve LDL 70mg , HDL >40mg .    Expected Outcomes  Short Term: Participant states understanding of desired cholesterol values and is compliant with medications prescribed. Participant is following exercise prescription and nutrition guidelines.;Long Term: Cholesterol controlled with medications as prescribed, with individualized exercise RX and with personalized nutrition plan. Value goals: LDL < 70mg , HDL > 40 mg.       Tobacco Use Initial Evaluation: Social History   Tobacco Use  Smoking Status Former Smoker  . Last attempt to quit: 1977  . Years since quitting: 42.3  Smokeless Tobacco Never Used  Tobacco Comment   quit in 78, teenager    Exercise Goals and Review: Exercise Goals    Row Name 07/27/17 1423             Exercise Goals   Increase Physical Activity  Yes       Intervention  Provide advice, education, support and counseling about physical activity/exercise needs.;Develop an individualized exercise prescription for aerobic and resistive training based on initial evaluation findings, risk stratification, comorbidities and participant's personal goals.       Expected Outcomes  Short Term: Attend rehab on a regular basis to increase amount of physical activity.;Long Term: Add in home exercise to make exercise part of routine and to increase amount of physical activity.;Long Term: Exercising regularly at least 3-5 days a week.       Increase Strength and Stamina  Yes        Intervention  Provide advice, education, support and counseling about physical activity/exercise needs.;Develop an individualized exercise prescription for aerobic and resistive training based on initial evaluation findings, risk stratification, comorbidities and participant's personal goals.       Expected Outcomes  Short Term: Increase workloads from initial exercise prescription for resistance, speed, and METs.;Short Term: Perform resistance training exercises routinely during rehab and add in resistance training at home;Long Term: Improve cardiorespiratory fitness, muscular endurance and strength as measured by increased METs and functional capacity (6MWT)       Able to understand and use rate of perceived exertion (RPE) scale  Yes       Intervention  Provide education and explanation on how to use RPE scale       Expected Outcomes  Short Term: Able to use RPE daily in rehab to express subjective intensity level;Long Term:  Able to use RPE to guide  intensity level when exercising independently       Knowledge and understanding of Target Heart Rate Range (THRR)  Yes       Intervention  Provide education and explanation of THRR including how the numbers were predicted and where they are located for reference       Expected Outcomes  Short Term: Able to state/look up THRR;Short Term: Able to use daily as guideline for intensity in rehab;Long Term: Able to use THRR to govern intensity when exercising independently       Able to check pulse independently  Yes       Intervention  Provide education and demonstration on how to check pulse in carotid and radial arteries.;Review the importance of being able to check your own pulse for safety during independent exercise       Expected Outcomes  Short Term: Able to explain why pulse checking is important during independent exercise;Long Term: Able to check pulse independently and accurately       Understanding of Exercise Prescription  Yes       Intervention   Provide education, explanation, and written materials on patient's individual exercise prescription       Expected Outcomes  Short Term: Able to explain program exercise prescription;Long Term: Able to explain home exercise prescription to exercise independently          Copy of goals given to participant.

## 2017-07-29 ENCOUNTER — Encounter: Payer: Self-pay | Admitting: *Deleted

## 2017-07-29 DIAGNOSIS — Z9889 Other specified postprocedural states: Secondary | ICD-10-CM

## 2017-07-29 NOTE — Progress Notes (Signed)
Cardiac Individual Treatment Plan  Patient Details  Name: Todd Frank MRN: [id number] Date of Birth: 07/06/1955 Referring Provider:     Cardiac Rehab from 07/27/2017 in [facility] Cardiac and Pulmonary Rehab  Referring Provider  [name] MD      Initial Encounter Date:    Cardiac Rehab from 07/27/2017 in [facility] Cardiac and Pulmonary Rehab  Date  07/27/17  Referring Provider  [name] MD      Visit Diagnosis: S/P aortic valve repair  Patient's Home Medications on Admission:  Current Outpatient Medications:  .  aspirin EC 81 MG tablet, Take 81 mg by mouth daily., Disp: , Rfl:  .  atorvastatin (LIPITOR) 80 MG tablet, Take 1 tablet (80 mg total) by mouth daily., Disp: 90 tablet, Rfl: 3 .  Coenzyme Q10 (COQ-10) 200 MG CAPS, Take 200 mg by mouth daily., Disp: , Rfl:  .  fluticasone (FLONASE) 50 MCG/ACT nasal spray, Place 2 sprays into both nostrils daily. (Patient taking differently: Place 2 sprays into both nostrils daily as needed (for allergies.). ), Disp: 16 g, Rfl: 12 .  metoprolol tartrate (LOPRESSOR) 25 MG tablet, Take 0.5 tablets (12.5 mg total) by mouth 2 (two) times daily., Disp: 60 tablet, Rfl: 6 .  traMADol (ULTRAM) 50 MG tablet, Take 50 mg by mouth every 4-6 hours PRN moderate to severe pain. (Patient not taking: Reported on 07/27/2017), Disp: 30 tablet, Rfl: 0  Past Medical History: Past Medical History:  Diagnosis Date  . Aortic stenosis    a. syncope/severe AS s/p bioprosthetic AVR 06/2017.  . Arthritis   . Bicuspid aortic valve   . Bradycardia 07/23/2015  . Broken clavicle    as child, can still dislocate at times  . CAD (coronary artery disease), native coronary artery    a. s/p 2V CABG at time of AVR 06/2017.  . Carotid bruit    carotid dopplers negative.  Bruit due to heart murmur from AS  . Congenital nevus   . Diastolic dysfunction   . Dilated aortic root ([place])    9m by echo 02/2016  . GERD (gastroesophageal reflux disease)   . Headache   . HTN  (hypertension)   . Hyperlipidemia    LDL goal < 70  . LVH (left ventricular hypertrophy)   . [place]   . Obesity   . PFO (patent foramen ovale)    small by echo 2014, but not demonstrated during intra-op note from CABG/AVR 06/2017.    Tobacco Use: Social History   Tobacco Use  Smoking Status Former Smoker  . Last attempt to quit: 1977  . Years since quitting: 42.3  Smokeless Tobacco Never Used  Tobacco Comment   quit in 78, teenager    Labs: Recent Review Flowsheet Data    Labs for ITP Cardiac and Pulmonary Rehab Latest Ref Rng & Units 06/25/2017 06/25/2017 06/25/2017 06/25/2017 06/26/2017   Cholestrol 100 - 199 mg/dL - - - - -   LDLCALC 0 - 99 mg/dL - - - - -   HDL >39 mg/dL - - - - -   Trlycerides 0 - 149 mg/dL - - - - -   Hemoglobin A1c 4.8 - 5.6 % - - - - -   PHART 7.350 - 7.450 7.366 7.349(L) - 7.311(L) -   PCO2ART 32.0 - 48.0 mmHg 40.3 39.2 - 47.0 -   HCO3 20.0 - 28.0 mmol/L 23.5 21.6 - 23.8 -   TCO2 22 - 32 mmol/L _0 23  ACIDBASEDEF 0.0 - 2.0 mmol/L 2.0 4.0(H) - 3.0(H) -   O2SAT % 97.0 98.0 - 98.0 -       Exercise Target Goals:    Exercise Program Goal: Individual exercise prescription set using results from initial 6 min walk test and THRR while considering  patient's activity barriers and safety.   Exercise Prescription Goal: Initial exercise prescription builds to 30-45 minutes a day of aerobic activity, 2-3 days per week.  Home exercise guidelines will be given to patient during program as part of exercise prescription that the participant will acknowledge.  Activity Barriers & Risk Stratification: Activity Barriers & Cardiac Risk Stratification - 07/27/17 1247      Activity Barriers & Cardiac Risk Stratification   Activity Barriers  Joint Problems R shoulder, history of broken collar bone    Cardiac Risk Stratification  Moderate       6 Minute Walk: 6 Minute Walk    Row Name 07/27/17 1406         6 Minute Walk   Phase   Initial     Distance  1550 feet     Walk Time  6 minutes     # of Rest Breaks  0     MPH  2.94     METS  3.93     RPE  10     VO2 Peak  13.74     Symptoms  No     Resting HR  60 bpm     Resting BP  134/74     Resting Oxygen Saturation   99 %     Exercise Oxygen Saturation  during 6 min walk  98 %     Max Ex. HR  94 bpm     Max Ex. BP  142/78     2 Minute Post BP  126/68        Oxygen Initial Assessment:   Oxygen Re-Evaluation:   Oxygen Discharge (Final Oxygen Re-Evaluation):   Initial Exercise Prescription: Initial Exercise Prescription - 07/27/17 1400      Date of Initial Exercise RX and Referring Provider   Date  07/27/17    Referring Provider  [name] MD      Treadmill   MPH  2.9    Grade  1    Minutes  15    METs  3.62      REL-XR   Level  2    Speed  50    Minutes  15    METs  3.6      T5 Nustep   Level  3    SPM  80    Minutes  15    METs  3.6      Prescription Details   Frequency (times per week)  3    Duration  Progress to 45 minutes of aerobic exercise without signs/symptoms of physical distress      Intensity   THRR 40-80% of Max Heartrate  99-138    Ratings of Perceived Exertion  11-13    Perceived Dyspnea  0-4      Progression   Progression  Continue to progress workloads to maintain intensity without signs/symptoms of physical distress.      Resistance Training   Training Prescription  Yes    Weight  4 lbs    Reps  10-15       Perform Capillary Blood Glucose checks as needed.  Exercise Prescription Changes: Exercise Prescription Changes    Row Name  07/27/17 1400             Response to Exercise   Blood Pressure (Admit)  134/74       Blood Pressure (Exercise)  142/78       Blood Pressure (Exit)  126/68       Heart Rate (Admit)  60 bpm       Heart Rate (Exercise)  94 bpm       Heart Rate (Exit)  61 bpm       Oxygen Saturation (Admit)  99 %       Oxygen Saturation (Exercise)  98 %       Rating of Perceived  Exertion (Exercise)  10       Symptoms  none       Comments  walk test results          Exercise Comments:   Exercise Goals and Review: Exercise Goals    Row Name 07/27/17 1423             Exercise Goals   Increase Physical Activity  Yes       Intervention  Provide advice, education, support and counseling about physical activity/exercise needs.;Develop an individualized exercise prescription for aerobic and resistive training based on initial evaluation findings, risk stratification, comorbidities and participant's personal goals.       Expected Outcomes  Short Term: Attend rehab on a regular basis to increase amount of physical activity.;Long Term: Add in home exercise to make exercise part of routine and to increase amount of physical activity.;Long Term: Exercising regularly at least 3-5 days a week.       Increase Strength and Stamina  Yes       Intervention  Provide advice, education, support and counseling about physical activity/exercise needs.;Develop an individualized exercise prescription for aerobic and resistive training based on initial evaluation findings, risk stratification, comorbidities and participant's personal goals.       Expected Outcomes  Short Term: Increase workloads from initial exercise prescription for resistance, speed, and METs.;Short Term: Perform resistance training exercises routinely during rehab and add in resistance training at home;Long Term: Improve cardiorespiratory fitness, muscular endurance and strength as measured by increased METs and functional capacity (6MWT)       Able to understand and use rate of perceived exertion (RPE) scale  Yes       Intervention  Provide education and explanation on how to use RPE scale       Expected Outcomes  Short Term: Able to use RPE daily in rehab to express subjective intensity level;Long Term:  Able to use RPE to guide intensity level when exercising independently       Knowledge and understanding of Target  Heart Rate Range (THRR)  Yes       Intervention  Provide education and explanation of THRR including how the numbers were predicted and where they are located for reference       Expected Outcomes  Short Term: Able to state/look up THRR;Short Term: Able to use daily as guideline for intensity in rehab;Long Term: Able to use THRR to govern intensity when exercising independently       Able to check pulse independently  Yes       Intervention  Provide education and demonstration on how to check pulse in carotid and radial arteries.;Review the importance of being able to check your own pulse for safety during independent exercise       Expected Outcomes  Short Term: Able to  explain why pulse checking is important during independent exercise;Long Term: Able to check pulse independently and accurately       Understanding of Exercise Prescription  Yes       Intervention  Provide education, explanation, and written materials on patient's individual exercise prescription       Expected Outcomes  Short Term: Able to explain program exercise prescription;Long Term: Able to explain home exercise prescription to exercise independently          Exercise Goals Re-Evaluation :   Discharge Exercise Prescription (Final Exercise Prescription Changes): Exercise Prescription Changes - 07/27/17 1400      Response to Exercise   Blood Pressure (Admit)  134/74    Blood Pressure (Exercise)  142/78    Blood Pressure (Exit)  126/68    Heart Rate (Admit)  60 bpm    Heart Rate (Exercise)  94 bpm    Heart Rate (Exit)  61 bpm    Oxygen Saturation (Admit)  99 %    Oxygen Saturation (Exercise)  98 %    Rating of Perceived Exertion (Exercise)  10    Symptoms  none    Comments  walk test results       Nutrition:  Target Goals: Understanding of nutrition guidelines, daily intake of sodium <1565m, cholesterol <2063m calories 30% from fat and 7% or less from saturated fats, daily to have 5 or more servings of fruits  and vegetables.  Biometrics: Pre Biometrics - 07/27/17 1423      Pre Biometrics   Height  5' 10.5" (1.791 m)    Weight  185 lb 8 oz (84.1 kg)    Waist Circumference  33 inches    Hip Circumference  38.5 inches    Waist to Hip Ratio  0.86 %    BMI (Calculated)  26.23    Single Leg Stand  30 seconds        Nutrition Therapy Plan and Nutrition Goals: Nutrition Therapy & Goals - 07/27/17 1241      Intervention Plan   Intervention  Nutrition handout(s) given to patient.;Prescribe, educate and counsel regarding individualized specific dietary modifications aiming towards targeted core components such as weight, hypertension, lipid management, diabetes, heart failure and other comorbidities.    Expected Outcomes  Short Term Goal: Understand basic principles of dietary content, such as calories, fat, sodium, cholesterol and nutrients.;Short Term Goal: A plan has been developed with personal nutrition goals set during dietitian appointment.;Long Term Goal: Adherence to prescribed nutrition plan.       Nutrition Assessments: Nutrition Assessments - 07/27/17 1241      MEDFICTS Scores   Pre Score  18       Nutrition Goals Re-Evaluation:   Nutrition Goals Discharge (Final Nutrition Goals Re-Evaluation):   Psychosocial: Target Goals: Acknowledge presence or absence of significant depression and/or stress, maximize coping skills, provide positive support system. Participant is able to verbalize types and ability to use techniques and skills needed for reducing stress and depression.   Initial Review & Psychosocial Screening: Initial Psych Review & Screening - 07/27/17 1242      Initial Review   Current issues with  Current Stress Concerns Mr. SuCallanderas some concerns about his heart post surgery, but is excited to get back to exercising and his routine. He gets a little nervous with the different "twinges and weird feelings coming from the heart"      FaAzure Yes spouse, sister, friends  Screening Interventions   Interventions  Encouraged to exercise;Program counselor consult;To provide support and resources with identified psychosocial needs    Expected Outcomes  Short Term goal: Utilizing psychosocial counselor, staff and physician to assist with identification of specific Stressors or current issues interfering with healing process. Setting desired goal for each stressor or current issue identified.;Long Term Goal: Stressors or current issues are controlled or eliminated.;Short Term goal: Identification and review with participant of any Quality of Life or Depression concerns found by scoring the questionnaire.;Long Term goal: The participant improves quality of Life and PHQ9 Scores as seen by post scores and/or verbalization of changes       Quality of Life Scores:  Quality of Life - 07/27/17 1244      Quality of Life Scores   Health/Function Pre  26.03 %    Socioeconomic Pre  28.93 %    Psych/Spiritual Pre  27.58 %    Family Pre  25.5 %    GLOBAL Pre  26.89 %      Scores of 19 and below usually indicate a poorer quality of life in these areas.  A difference of  2-3 points is a clinically meaningful difference.  A difference of 2-3 points in the total score of the Quality of Life Index has been associated with significant improvement in overall quality of life, self-image, physical symptoms, and general health in studies assessing change in quality of life.  PHQ-9: Recent Review Flowsheet Data    Depression screen [facility] 2/9 07/27/2017 02/06/2017 01/09/2016 02/02/2015   Decreased Interest 0 0 0 0   Down, Depressed, Hopeless 0 0 0 0   PHQ - 2 Score 0 0 0 0   Altered sleeping 1 - - -   Tired, decreased energy 0 - - -   Change in appetite 0 - - -   Feeling bad or failure about yourself  0 - - -   Trouble concentrating 0 - - -   Moving slowly or fidgety/restless 0 - - -   Suicidal thoughts 0 - - -   PHQ-9 Score 1 - - -    Difficult doing work/chores Not difficult at all - - -     Interpretation of Total Score  Total Score Depression Severity:  1-4 = Minimal depression, 5-9 = Mild depression, 10-14 = Moderate depression, 15-19 = Moderately severe depression, 20-27 = Severe depression   Psychosocial Evaluation and Intervention:   Psychosocial Re-Evaluation:   Psychosocial Discharge (Final Psychosocial Re-Evaluation):   Vocational Rehabilitation: Provide vocational rehab assistance to qualifying candidates.   Vocational Rehab Evaluation & Intervention: Vocational Rehab - 07/27/17 1245      Initial Vocational Rehab Evaluation & Intervention   Assessment shows need for Vocational Rehabilitation  No       Education: Education Goals: Education classes will be provided on a variety of topics geared toward better understanding of heart health and risk factor modification. Participant will state understanding/return demonstration of topics presented as noted by education test scores.  Learning Barriers/Preferences: Learning Barriers/Preferences - 07/27/17 1245      Learning Barriers/Preferences   Learning Barriers  None    Learning Preferences  None       Education Topics:  AED/CPR: - Group verbal and written instruction with the use of models to demonstrate the basic use of the AED with the basic ABC's of resuscitation.   General Nutrition Guidelines/Fats and Fiber: -Group instruction provided by verbal, written material, models and posters to present the general  guidelines for heart healthy nutrition. Gives an explanation and review of dietary fats and fiber.   Controlling Sodium/Reading Food Labels: -Group verbal and written material supporting the discussion of sodium use in heart healthy nutrition. Review and explanation with models, verbal and written materials for utilization of the food label.   Exercise Physiology & General Exercise Guidelines: - Group verbal and written  instruction with models to review the exercise physiology of the cardiovascular system and associated critical values. Provides general exercise guidelines with specific guidelines to those with heart or lung disease.    Aerobic Exercise & Resistance Training: - Gives group verbal and written instruction on the various components of exercise. Focuses on aerobic and resistive training programs and the benefits of this training and how to safely progress through these programs..   Flexibility, Balance, Mind/Body Relaxation: Provides group verbal/written instruction on the benefits of flexibility and balance training, including mind/body exercise modes such as yoga, pilates and tai chi.  Demonstration and skill practice provided.   Stress and Anxiety: - Provides group verbal and written instruction about the health risks of elevated stress and causes of high stress.  Discuss the correlation between heart/lung disease and anxiety and treatment options. Review healthy ways to manage with stress and anxiety.   Depression: - Provides group verbal and written instruction on the correlation between heart/lung disease and depressed mood, treatment options, and the stigmas associated with seeking treatment.   Anatomy & Physiology of the Heart: - Group verbal and written instruction and models provide basic cardiac anatomy and physiology, with the coronary electrical and arterial systems. Review of Valvular disease and Heart Failure   Cardiac Procedures: - Group verbal and written instruction to review commonly prescribed medications for heart disease. Reviews the medication, class of the drug, and side effects. Includes the steps to properly store meds and maintain the prescription regimen. (beta blockers and nitrates)   Cardiac Medications I: - Group verbal and written instruction to review commonly prescribed medications for heart disease. Reviews the medication, class of the drug, and side  effects. Includes the steps to properly store meds and maintain the prescription regimen.   Cardiac Medications II: -Group verbal and written instruction to review commonly prescribed medications for heart disease. Reviews the medication, class of the drug, and side effects. (all other drug classes)    Go Sex-Intimacy & Heart Disease, Get SMART - Goal Setting: - Group verbal and written instruction through game format to discuss heart disease and the return to sexual intimacy. Provides group verbal and written material to discuss and apply goal setting through the application of the S.M.A.R.T. Method.   Other Matters of the Heart: - Provides group verbal, written materials and models to describe Stable Angina and Peripheral Artery. Includes description of the disease process and treatment options available to the cardiac patient.   Exercise & Equipment Safety: - Individual verbal instruction and demonstration of equipment use and safety with use of the equipment.   Cardiac Rehab from 07/27/2017 in [facility] Cardiac and Pulmonary Rehab  Date  07/27/17  Educator  [facility]  Instruction Review Code  1- Verbalizes Understanding      Infection Prevention: - Provides verbal and written material to individual with discussion of infection control including proper hand washing and proper equipment cleaning during exercise session.   Cardiac Rehab from 07/27/2017 in [facility] Cardiac and Pulmonary Rehab  Date  07/27/17  Educator  [facility]  Instruction Review Code  1- Verbalizes Understanding  Falls Prevention: - Provides verbal and written material to individual with discussion of falls prevention and safety.   Cardiac Rehab from 07/27/2017 in [facility] Cardiac and Pulmonary Rehab  Date  07/27/17  Educator  [facility]  Instruction Review Code  1- Verbalizes Understanding      Diabetes: - Individual verbal and written instruction to review signs/symptoms of diabetes, desired ranges of glucose level fasting, after  meals and with exercise. Acknowledge that pre and post exercise glucose checks will be done for 3 sessions at entry of program.   Know Your Numbers and Risk Factors: -Group verbal and written instruction about important numbers in your health.  Discussion of what are risk factors and how they play a role in the disease process.  Review of Cholesterol, Blood Pressure, Diabetes, and BMI and the role they play in your overall health.   Sleep Hygiene: -Provides group verbal and written instruction about how sleep can affect your health.  Define sleep hygiene, discuss sleep cycles and impact of sleep habits. Review good sleep hygiene tips.    Other: -Provides group and verbal instruction on various topics (see comments)   Knowledge Questionnaire Score: Knowledge Questionnaire Score - 07/27/17 1245      Knowledge Questionnaire Score   Pre Score  28/28       Core Components/Risk Factors/Patient Goals at Admission: Personal Goals and Risk Factors at Admission - 07/27/17 1237      Core Components/Risk Factors/Patient Goals on Admission    Weight Management  Yes;Weight Loss    Intervention  Weight Management: Develop a combined nutrition and exercise program designed to reach desired caloric intake, while maintaining appropriate intake of nutrient and fiber, sodium and fats, and appropriate energy expenditure required for the weight goal.;Weight Management: Provide education and appropriate resources to help participant work on and attain dietary goals.;Weight Management/Obesity: Establish reasonable short term and long term weight goals.    Admit Weight  185 lb (83.9 kg)    Goal Weight: Short Term  180 lb (81.6 kg)    Goal Weight: Long Term  160 lb (72.6 kg)    Expected Outcomes  Short Term: Continue to assess and modify interventions until short term weight is achieved;Long Term: Adherence to nutrition and physical activity/exercise program aimed toward attainment of established weight  goal;Weight Loss: Understanding of general recommendations for a balanced deficit meal plan, which promotes 1-2 lb weight loss per week and includes a negative energy balance of [phone] kcal/d;Understanding recommendations for meals to include 15-35% energy as protein, 25-35% energy from fat, 35-60% energy from carbohydrates, less than 265m of dietary cholesterol, 20-35 gm of total fiber daily;Understanding of distribution of calorie intake throughout the day with the consumption of 4-5 meals/snacks    Hypertension  Yes    Intervention  Provide education on lifestyle modifcations including regular physical activity/exercise, weight management, moderate sodium restriction and increased consumption of fresh fruit, vegetables, and low fat dairy, alcohol moderation, and smoking cessation.;Monitor prescription use compliance.    Expected Outcomes  Short Term: Continued assessment and intervention until BP is < 140/920mHG in hypertensive participants. < 130/80104mG in hypertensive participants with diabetes, heart failure or chronic kidney disease.;Long Term: Maintenance of blood pressure at goal levels.    Lipids  Yes    Intervention  Provide education and support for participant on nutrition & aerobic/resistive exercise along with prescribed medications to achieve LDL <62m35mDL >40mg46m Expected Outcomes  Short Term: Participant states understanding of desired cholesterol values  and is compliant with medications prescribed. Participant is following exercise prescription and nutrition guidelines.;Long Term: Cholesterol controlled with medications as prescribed, with individualized exercise RX and with personalized nutrition plan. Value goals: LDL < 37m, HDL > 40 mg.       Core Components/Risk Factors/Patient Goals Review:    Core Components/Risk Factors/Patient Goals at Discharge (Final Review):    ITP Comments: ITP Comments    Row Name 07/27/17 1231 07/29/17 0603         ITP Comments  Med  Review completed. Initial ITP created. [facility]  [facility] day review. Continue with ITP unless directed changes per Medical Director     New to program         Comments:

## 2017-07-29 NOTE — Progress Notes (Signed)
Daily Session Note  Patient Details  Name: Todd Frank MRN: [id number] Date of Birth: 01-Feb-1956 Referring Provider:     Cardiac Rehab from 07/27/2017 in [facility] Cardiac and Pulmonary Rehab  Referring Provider  [name] MD      Encounter Date: 07/29/2017  Check In: Session Check In - 07/29/17 0751      Check-In   Location  [facility]-Cardiac & Pulmonary Rehab    Staff Present  Justin Mend RCP,RRT,BSRT;Heath Lark, RN, BSN, CCRP;Jessica Luan Pulling, MA, RCEP, CCRP, Exercise Physiologist    Supervising physician immediately available to respond to emergencies  See telemetry face sheet for immediately available ER MD    Medication changes reported      No    Fall or balance concerns reported     No    Tobacco Cessation  No Change    Warm-up and Cool-down  Performed on first and last piece of equipment    Resistance Training Performed  Yes    VAD Patient?  No      Pain Assessment   Currently in Pain?  No/denies          Social History   Tobacco Use  Smoking Status Former Smoker  . Last attempt to quit: 1977  . Years since quitting: 42.3  Smokeless Tobacco Never Used  Tobacco Comment   quit in 78, teenager    Goals Met:  Exercise tolerated well Personal goals reviewed No report of cardiac concerns or symptoms Strength training completed today  Goals Unmet:  Not Applicable  Comments: First full day of exercise!  Patient was oriented to gym and equipment including functions, settings, policies, and procedures.  Patient's individual exercise prescription and treatment plan were reviewed.  All starting workloads were established based on the results of the 6 minute walk test done at initial orientation visit.  The plan for exercise progression was also introduced and progression will be customized based on patient's performance and goals.   Dr. Emily Filbert is Medical Director for [facility] and [facility].

## 2017-07-31 ENCOUNTER — Encounter: Payer: Managed Care, Other (non HMO) | Admitting: *Deleted

## 2017-07-31 DIAGNOSIS — Z9889 Other specified postprocedural states: Secondary | ICD-10-CM

## 2017-07-31 NOTE — Progress Notes (Signed)
Daily Session Note  Patient Details  Name: Todd Frank MRN: [id number] Date of Birth: 04/09/1955 Referring Provider:     Cardiac Rehab from 07/27/2017 in [facility] Cardiac and Pulmonary Rehab  Referring Provider  [name], Traci MD      Encounter Date: 07/31/2017  Check In: Session Check In - 07/31/17 0819      Check-In   Location  [facility]    Staff Present  Jessica Hawkins, MA, RCEP, CCRP, Exercise Physiologist;Amanda Sommer, BA, ACSM CEP, Exercise Physiologist;Meredith Craven, RN BSN    Supervising physician immediately available to respond to emergencies  See telemetry face sheet for immediately available ER MD    Medication changes reported      No    Fall or balance concerns reported     No    Warm-up and Cool-down  Performed on first and last piece of equipment    Resistance Training Performed  Yes    VAD Patient?  No      Pain Assessment   Currently in Pain?  No/denies          Social History   Tobacco Use  Smoking Status Former Smoker  . Last attempt to quit: 1977  . Years since quitting: 42.3  Smokeless Tobacco Never Used  Tobacco Comment   quit in 78, teenager    Goals Met:  Independence with exercise equipment Exercise tolerated well No report of cardiac concerns or symptoms Strength training completed today  Goals Unmet:  Not Applicable  Comments: Pt able to follow exercise prescription today without complaint.  Will continue to monitor for progression.    Dr. Mark Miller is Medical Director for [facility] and [facility]. 

## 2017-08-03 ENCOUNTER — Encounter: Payer: Managed Care, Other (non HMO) | Admitting: *Deleted

## 2017-08-03 DIAGNOSIS — Z9889 Other specified postprocedural states: Secondary | ICD-10-CM | POA: Diagnosis not present

## 2017-08-03 NOTE — Progress Notes (Signed)
Daily Session Note  Patient Details  Name: Todd Frank MRN: [id number] Date of Birth: 1955/04/28 Referring Provider:     Cardiac Rehab from 07/27/2017 in [facility] Cardiac and Pulmonary Rehab  Referring Provider  [name] MD      Encounter Date: 08/03/2017  Check In: Session Check In - 08/03/17 0741      Check-In   Location  [facility] Re[facility]    Staff Present  Heath Lark, RN, BSN, CCRP;Marrah Vanevery Luan Pulling, MA, RCEP, CCRP, Exercise Physiologist;Kelly Amedeo Plenty, BS, ACSM CEP, Exercise Physiologist    Supervising physician immediately available to respond to emergencies  See telemetry face sheet for immediately available ER MD    Medication changes reported      No    Fall or balance concerns reported     No    Warm-up and Cool-down  Performed on first and last piece of equipment    Resistance Training Performed  Yes    VAD Patient?  No      Pain Assessment   Currently in Pain?  No/denies          Social History   Tobacco Use  Smoking Status Former Smoker  . Last attempt to quit: 1977  . Years since quitting: 42.3  Smokeless Tobacco Never Used  Tobacco Comment   quit in 78, teenager    Goals Met:  Independence with exercise equipment Exercise tolerated well No report of cardiac concerns or symptoms Strength training completed today  Goals Unmet:  Not Applicable  Comments: Pt able to follow exercise prescription today without complaint.  Will continue to monitor for progression.    Dr. Emily Filbert is Medical Director for [place] and [facility].

## 2017-08-05 ENCOUNTER — Encounter: Payer: Managed Care, Other (non HMO) | Attending: Cardiology

## 2017-08-05 ENCOUNTER — Ambulatory Visit: Payer: Managed Care, Other (non HMO) | Admitting: Cardiothoracic Surgery

## 2017-08-05 DIAGNOSIS — Z9889 Other specified postprocedural states: Secondary | ICD-10-CM

## 2017-08-05 DIAGNOSIS — Z79891 Long term (current) use of opiate analgesic: Secondary | ICD-10-CM | POA: Insufficient documentation

## 2017-08-05 DIAGNOSIS — Z79899 Other long term (current) drug therapy: Secondary | ICD-10-CM | POA: Diagnosis not present

## 2017-08-05 DIAGNOSIS — Z87891 Personal history of nicotine dependence: Secondary | ICD-10-CM | POA: Diagnosis not present

## 2017-08-05 DIAGNOSIS — Z7982 Long term (current) use of aspirin: Secondary | ICD-10-CM | POA: Insufficient documentation

## 2017-08-05 DIAGNOSIS — I2510 Atherosclerotic heart disease of native coronary artery without angina pectoris: Secondary | ICD-10-CM | POA: Diagnosis not present

## 2017-08-05 DIAGNOSIS — I10 Essential (primary) hypertension: Secondary | ICD-10-CM | POA: Diagnosis not present

## 2017-08-05 NOTE — Progress Notes (Signed)
Daily Session Note  Patient Details  Name: Todd Frank MRN: [id number] Date of Birth: [birth year] Referring Provider:     Cardiac Rehab from 07/27/2017 in [facility] Cardiac and Pulmonary Rehab  Referring Provider  [name] MD      Encounter Date: 08/05/2017  Check In: Session Check In - 08/05/17 0719      Check-In   Location  [facility]    Staff Present  Justin Mend RCP,RRT,BSRT;Heath Lark, RN, BSN, CCRP;Jessica Luan Pulling, MA, RCEP, CCRP, Exercise Physiologist    Supervising physician immediately available to respond to emergencies  See telemetry face sheet for immediately available ER MD    Medication changes reported      No    Fall or balance concerns reported     No    Tobacco Cessation  No Change    Warm-up and Cool-down  Performed on first and last piece of equipment    Resistance Training Performed  Yes    VAD Patient?  No      Pain Assessment   Currently in Pain?  No/denies          Social History   Tobacco Use  Smoking Status Former Smoker  . Last attempt to quit: 1977  . Years since quitting: 42.3  Smokeless Tobacco Never Used  Tobacco Comment   quit in 78, teenager    Goals Met:  Independence with exercise equipment Exercise tolerated well No report of cardiac concerns or symptoms Strength training completed today  Goals Unmet:  Not Applicable  Comments: Pt able to follow exercise prescription today without complaint.  Will continue to monitor for progression.   Dr. Emily Filbert is Medical Director for [place] and [facility].

## 2017-08-07 ENCOUNTER — Encounter: Payer: Managed Care, Other (non HMO) | Admitting: *Deleted

## 2017-08-07 DIAGNOSIS — Z9889 Other specified postprocedural states: Secondary | ICD-10-CM

## 2017-08-07 NOTE — Progress Notes (Signed)
Daily Session Note  Patient Details  Name: Todd Frank MRN: [id number] Date of Birth: 02/24/1956 Referring Provider:     Cardiac Rehab from 07/27/2017 in [facility] Cardiac and Pulmonary Rehab  Referring Provider  [name] MD      Encounter Date: 08/07/2017  Check In: Session Check In - 08/07/17 0945      Check-In   Location  [facility] Re[facility]    Staff Present  Renita Papa, RN BSN;Jaquitta Dupriest Luan Pulling, MA, RCEP, CCRP, Exercise Physiologist;Amanda Oletta Darter, [place], ACSM CEP, Exercise Physiologist    Supervising physician immediately available to respond to emergencies  See telemetry face sheet for immediately available ER MD    Medication changes reported      No    Fall or balance concerns reported     No    Warm-up and Cool-down  Performed on first and last piece of equipment    Resistance Training Performed  Yes    VAD Patient?  No      Pain Assessment   Currently in Pain?  No/denies          Social History   Tobacco Use  Smoking Status Former Smoker  . Last attempt to quit: 1977  . Years since quitting: 42.3  Smokeless Tobacco Never Used  Tobacco Comment   quit in 78, teenager    Goals Met:  Independence with exercise equipment Exercise tolerated well No report of cardiac concerns or symptoms Strength training completed today  Goals Unmet:  Not Applicable  Comments: Pt able to follow exercise prescription today without complaint.  Will continue to monitor for progression. Reviewed home exercise with pt today.  Pt plans to use his treadmill at home and continue to go to [facility] \ for exercise.  Reviewed THR, pulse, RPE, sign and symptoms, NTG use, and when to call 911 or MD.  Also discussed weather considerations and indoor options.  Pt voiced understanding.    Dr. Emily Filbert is Medical Director for [place] and [facility].

## 2017-08-10 ENCOUNTER — Encounter: Payer: Managed Care, Other (non HMO) | Admitting: *Deleted

## 2017-08-10 DIAGNOSIS — Z9889 Other specified postprocedural states: Secondary | ICD-10-CM

## 2017-08-10 NOTE — Progress Notes (Signed)
Daily Session Note  Patient Details  Name: Todd Frank MRN: [id number] Date of Birth: [birth year] Referring Provider:     Cardiac Rehab from 07/27/2017 in [facility] Cardiac and Pulmonary Rehab  Referring Provider  [name] MD      Encounter Date: 08/10/2017  Check In: Session Check In - 08/10/17 0753      Check-In   Location  [facility]    Staff Present  Heath Lark, RN, BSN, CCRP;Katharine Rochefort Luan Pulling, MA, RCEP, CCRP, Exercise Physiologist;Kelly Amedeo Plenty, BS, ACSM CEP, Exercise Physiologist    Supervising physician immediately available to respond to emergencies  See telemetry face sheet for immediately available ER MD    Medication changes reported      No    Fall or balance concerns reported     No    Warm-up and Cool-down  Performed on first and last piece of equipment    Resistance Training Performed  Yes    VAD Patient?  No      Pain Assessment   Currently in Pain?  No/denies          Social History   Tobacco Use  Smoking Status Former Smoker  . Last attempt to quit: 1977  . Years since quitting: 42.3  Smokeless Tobacco Never Used  Tobacco Comment   quit in 78, teenager    Goals Met:  Independence with exercise equipment Exercise tolerated well No report of cardiac concerns or symptoms Strength training completed today  Goals Unmet:  Not Applicable  Comments: Pt able to follow exercise prescription today without complaint.  Will continue to monitor for progression.    Dr. Emily Filbert is Medical Director for [place] and [facility].

## 2017-08-12 ENCOUNTER — Other Ambulatory Visit: Payer: Self-pay | Admitting: Cardiothoracic Surgery

## 2017-08-12 DIAGNOSIS — Z9889 Other specified postprocedural states: Secondary | ICD-10-CM

## 2017-08-12 DIAGNOSIS — Z951 Presence of aortocoronary bypass graft: Secondary | ICD-10-CM

## 2017-08-12 NOTE — Progress Notes (Signed)
Daily Session Note  Patient Details  Name: KOL CONSUEGRA MRN: [id number] Date of Birth: 11-06-1955 Referring Provider:     Cardiac Rehab from 07/27/2017 in [facility] Cardiac and Pulmonary Rehab  Referring Provider  [name] MD      Encounter Date: 08/12/2017  Check In: Session Check In - 08/12/17 0753      Check-In   Location  [facility]monary Re[facility]    Staff Present  Heath Lark, RN, BSN, CCRP;Chanler Schreiter Darrin Nipper, [place], [place], [place], Exercise Physiologist    Supervising physician immediately available to respond to emergencies  See telemetry face sheet for immediately available ER MD    Medication changes reported      No    Fall or balance concerns reported     No    Tobacco Cessation  No Change    Warm-up and Cool-down  Performed on first and last piece of equipment    Resistance Training Performed  Yes    VAD Patient?  No      Pain Assessment   Currently in Pain?  No/denies          Social History   Tobacco Use  Smoking Status Former Smoker  . Last attempt to quit: 1977  . Years since quitting: 42.3  Smokeless Tobacco Never Used  Tobacco Comment   quit in 78, teenager    Goals Met:  Independence with exercise equipment Exercise tolerated well No report of cardiac concerns or symptoms Strength training completed today  Goals Unmet:  Not Applicable  Comments: Pt able to follow exercise prescription today without complaint.  Will continue to monitor for progression.   Dr. Emily Filbert is Medical Director for [facility] and [facility].

## 2017-08-14 ENCOUNTER — Encounter: Payer: Managed Care, Other (non HMO) | Admitting: *Deleted

## 2017-08-14 DIAGNOSIS — Z9889 Other specified postprocedural states: Secondary | ICD-10-CM

## 2017-08-14 NOTE — Progress Notes (Signed)
Daily Session Note  Patient Details  Name: Todd Frank MRN: [id number] Date of Birth: 1955/06/23 Referring Provider:     Cardiac Rehab from 07/27/2017 in [facility] Cardiac and Pulmonary Rehab  Referring Provider  [name] MD      Encounter Date: 08/14/2017  Check In: Session Check In - 08/14/17 1124      Check-In   Location  [facility]    Staff Present  Renita Papa, RN BSN;Harshil Cavallaro Luan Pulling, MA, RCEP, CCRP, Exercise Physiologist;Amanda Oletta Darter, [place], ACSM CEP, Exercise Physiologist    Supervising physician immediately available to respond to emergencies  See telemetry face sheet for immediately available ER MD    Medication changes reported      No    Fall or balance concerns reported     No    Warm-up and Cool-down  Performed on first and last piece of equipment    Resistance Training Performed  Yes    VAD Patient?  No      Pain Assessment   Currently in Pain?  No/denies          Social History   Tobacco Use  Smoking Status Former Smoker  . Last attempt to quit: 1977  . Years since quitting: 42.3  Smokeless Tobacco Never Used  Tobacco Comment   quit in 78, teenager    Goals Met:  Independence with exercise equipment Exercise tolerated well No report of cardiac concerns or symptoms Strength training completed today  Goals Unmet:  Not Applicable  Comments: Pt able to follow exercise prescription today without complaint.  Will continue to monitor for progression.    Dr. Emily Filbert is Medical Director for [place] and [facility].

## 2017-08-17 ENCOUNTER — Ambulatory Visit (INDEPENDENT_AMBULATORY_CARE_PROVIDER_SITE_OTHER): Payer: Self-pay | Admitting: Physician Assistant

## 2017-08-17 ENCOUNTER — Ambulatory Visit
Admission: RE | Admit: 2017-08-17 | Discharge: 2017-08-17 | Disposition: A | Discharge status: Home / Self Care | Payer: Managed Care, Other (non HMO) | Source: Ambulatory Visit | Attending: Cardiothoracic Surgery | Admitting: Cardiothoracic Surgery

## 2017-08-17 ENCOUNTER — Encounter: Payer: Managed Care, Other (non HMO) | Admitting: *Deleted

## 2017-08-17 ENCOUNTER — Other Ambulatory Visit: Payer: Self-pay

## 2017-08-17 VITALS — BP 136/89 | HR 62 | Resp 18 | Ht 69.5 in | Wt 182.0 lb

## 2017-08-17 DIAGNOSIS — Z951 Presence of aortocoronary bypass graft: Secondary | ICD-10-CM

## 2017-08-17 DIAGNOSIS — Z9889 Other specified postprocedural states: Secondary | ICD-10-CM

## 2017-08-17 DIAGNOSIS — Z952 Presence of prosthetic heart valve: Secondary | ICD-10-CM

## 2017-08-17 NOTE — Progress Notes (Signed)
Daily Session Note  Patient Details  Name: Todd Frank MRN: [id number] Date of Birth: Nov 22, 1955 Referring Provider:     Cardiac Rehab from 07/27/2017 in [facility] Cardiac and Pulmonary Rehab  Referring Provider  [name] MD      Encounter Date: 08/17/2017  Check In: Session Check In - 08/17/17 0752      Check-In   Location  [facility]    Staff Present  Earlean Shawl, BS, ACSM CEP, Exercise Physiologist;Garris Melhorn Luan Pulling, MA, RCEP, CCRP, Exercise Physiologist;Susanne Bice, RN, BSN, CCRP    Supervising physician immediately available to respond to emergencies  See telemetry face sheet for immediately available ER MD    Medication changes reported      No    Fall or balance concerns reported     No    Warm-up and Cool-down  Performed on first and last piece of equipment    Resistance Training Performed  Yes    VAD Patient?  No      Pain Assessment   Currently in Pain?  No/denies          Social History   Tobacco Use  Smoking Status Former Smoker  . Last attempt to quit: 1977  . Years since quitting: 42.3  Smokeless Tobacco Never Used  Tobacco Comment   quit in 78, teenager    Goals Met:  Independence with exercise equipment Exercise tolerated well No report of cardiac concerns or symptoms Strength training completed today  Goals Unmet:  Not Applicable  Comments: Pt able to follow exercise prescription today without complaint.  Will continue to monitor for progression.    Dr. Emily Filbert is Medical Director for [place] and [facility].

## 2017-08-17 NOTE — Patient Instructions (Signed)
You may return to driving an automobile as long as you are no longer requiring oral narcotic pain relievers during the daytime.  It would be wise to start driving only short distances during the daylight and gradually increase from there as you feel comfortable.   Make every effort to maintain a "heart-healthy" lifestyle with regular physical exercise and adherence to a low-fat, low-carbohydrate diet.  Continue to seek regular follow-up appointments with your primary care physician and/or cardiologist.   You may continue to gradually increase your physical activity as tolerated.  Refrain from any heavy lifting or strenuous use of your arms and shoulders until at least 8 weeks from the time of your surgery, and avoid activities that cause increased pain in your chest on the side of your surgical incision.  Otherwise you may continue to increase activities without any particular limitations.  Increase the intensity and duration of physical activity gradually.   Endocarditis is a potentially serious infection of heart valves or inside lining of the heart.  It occurs more commonly in patients with diseased heart valves (such as patient's with aortic or mitral valve disease) and in patients who have undergone heart valve repair or replacement.  Certain surgical and dental procedures may put you at risk, such as dental cleaning, other dental procedures, or any surgery involving the respiratory, urinary, gastrointestinal tract, gallbladder or prostate gland.   To minimize your chances for develooping endocarditis, maintain good oral health and seek prompt medical attention for any infections involving the mouth, teeth, gums, skin or urinary tract.    Always notify your doctor or dentist about your underlying heart valve condition before having any invasive procedures. You will need to take antibiotics before certain procedures, including all routine dental cleanings or other dental procedures.  Your  cardiologist or dentist should prescribe these antibiotics for you to be taken ahead of time.       

## 2017-08-17 NOTE — Progress Notes (Signed)
HPI: Patient returns for routine postoperative follow-up having undergone CABG x 2 and AVR on 06/25/2017. The patient's early postoperative recovery while in the hospital was notable for development of new LBBB. Since hospital discharge the patient reports he has been doing very well.  He states that he is not back to his normal.  He is ambulating without issue walks on the treadmill at home.  He has already started participation with cardiac rehab which he really enjoys.  His surgical incisions are well healed.  He does have some residual numbness across his sternotomy and lower leg incision.  He otherwise has no complaints.  Current Outpatient Medications  Medication Sig Dispense Refill  . aspirin EC 81 MG tablet Take 81 mg by mouth daily.    Marland Kitchen atorvastatin (LIPITOR) 80 MG tablet Take 1 tablet (80 mg total) by mouth daily. 90 tablet 3  . Coenzyme Q10 (COQ-10) 200 MG CAPS Take 200 mg by mouth daily.    . fluticasone (FLONASE) 50 MCG/ACT nasal spray Place 2 sprays into both nostrils daily. (Patient taking differently: Place 2 sprays into both nostrils daily as needed (for allergies.). ) 16 g 12  . metoprolol tartrate (LOPRESSOR) 25 MG tablet Take 0.5 tablets (12.5 mg total) by mouth 2 (two) times daily. 60 tablet 6   No current facility-administered medications for this visit.     Physical Exam:  BP 136/89 (BP Location: Right Arm, Patient Position: Sitting, Cuff Size: Normal)   Pulse 62   Resp 18   Ht 5' 9.5" (1.765 m)   Wt 182 lb (82.6 kg)   SpO2 99% Comment: RA  BMI 26.49 kg/m   Gen: no apparent distress Heart: RRR Lungs: CTA bilaterally Ext: no edema Incisions: well healed  Diagnostic Tests:  CXR: improvement of minimal left pleural effusion, minimal scarring remains   A/P:  1. S/p CABG, AVR- doing very well, actively participating with cardiac rehab, continue Lopressor 2. RTW slip provided for 6/24 as patient would like to complete cardiac rehab program prior to  returning 3. RTC in September with Dr. Osa Craver, PA-C [facility] [phone]

## 2017-08-18 ENCOUNTER — Encounter: Payer: Self-pay | Admitting: Physician Assistant

## 2017-08-19 ENCOUNTER — Encounter: Payer: Managed Care, Other (non HMO) | Admitting: *Deleted

## 2017-08-19 DIAGNOSIS — Z9889 Other specified postprocedural states: Secondary | ICD-10-CM | POA: Diagnosis not present

## 2017-08-19 NOTE — Progress Notes (Signed)
Daily Session Note  Patient Details  Name: Todd Frank MRN: [id number] Date of Birth: 10-30-1955 Referring Provider:     Cardiac Rehab from 07/27/2017 in [facility] Cardiac and Pulmonary Rehab  Referring Provider  [name] MD      Encounter Date: 08/19/2017  Check In: Session Check In - 08/19/17 0848      Check-In   Staff Present  Nyoka Cowden, RN, BSN, MA;Susanne Bice, RN, BSN, CCRP;Jessica Luan Pulling, MA, RCEP, CCRP, Exercise Physiologist    Supervising physician immediately available to respond to emergencies  See telemetry face sheet for immediately available ER MD    Medication changes reported      No    Fall or balance concerns reported     No    Warm-up and Cool-down  Performed as group-led instruction    Resistance Training Performed  Yes    VAD Patient?  No      Pain Assessment   Currently in Pain?  No/denies          Social History   Tobacco Use  Smoking Status Former Smoker  . Last attempt to quit: 1977  . Years since quitting: 42.3  Smokeless Tobacco Never Used  Tobacco Comment   quit in 78, teenager    Goals Met:  Independence with exercise equipment Exercise tolerated well No report of cardiac concerns or symptoms Strength training completed today  Goals Unmet:  Not Applicable  Comments: Doing well with exercise prescription progression.    Dr. Emily Filbert is Medical Director for [place] and [facility].

## 2017-08-21 ENCOUNTER — Encounter: Payer: Managed Care, Other (non HMO) | Admitting: *Deleted

## 2017-08-21 DIAGNOSIS — Z9889 Other specified postprocedural states: Secondary | ICD-10-CM | POA: Diagnosis not present

## 2017-08-21 NOTE — Progress Notes (Signed)
Daily Session Note  Patient Details  Name: Todd Frank MRN: [id number] Date of Birth: [birth year] Referring Provider:     Cardiac Rehab from 07/27/2017 in [facility] Cardiac and Pulmonary Rehab  Referring Provider  [name] MD      Encounter Date: 08/21/2017  Check In: Session Check In - 08/21/17 0805      Check-In   Location  [facility]Cardiac [facility]monary Rehab    Staff Present  Renita Papa, RN BSN;Babita Amaker Luan Pulling, MA, RCEP, CCRP, Exercise Physiologist;Amanda Oletta Darter, [place], ACSM CEP, Exercise Physiologist    Supervising physician immediately available to respond to emergencies  See telemetry face sheet for immediately available ER MD    Medication changes reported      No    Fall or balance concerns reported     No    Warm-up and Cool-down  Performed on first and last piece of equipment    Resistance Training Performed  Yes    VAD Patient?  No      Pain Assessment   Currently in Pain?  No/denies          Social History   Tobacco Use  Smoking Status Former Smoker  . Last attempt to quit: 1977  . Years since quitting: 42.4  Smokeless Tobacco Never Used  Tobacco Comment   quit in 78, teenager    Goals Met:  Independence with exercise equipment Exercise tolerated well No report of cardiac concerns or symptoms Strength training completed today  Goals Unmet:  Not Applicable  Comments: Pt able to follow exercise prescription today without complaint.  Will continue to monitor for progression.    Dr. Emily Filbert is Medical Director for [place] and [facility].

## 2017-08-24 ENCOUNTER — Encounter: Payer: Managed Care, Other (non HMO) | Admitting: *Deleted

## 2017-08-24 DIAGNOSIS — Z9889 Other specified postprocedural states: Secondary | ICD-10-CM

## 2017-08-24 NOTE — Progress Notes (Signed)
Daily Session Note  Patient Details  Name: Todd Frank MRN: [id number] Date of Birth: Mar 04, 1956 Referring Provider:     Cardiac Rehab from 07/27/2017 in [facility] Cardiac and Pulmonary Rehab  Referring Provider  [name] MD      Encounter Date: 08/24/2017  Check In: Session Check In - 08/24/17 0854      Check-In   Location  [facility]iac [facility]monary Re[facility]    Staff Present  Alberteen Sam, MA, RCEP, CCRP, Exercise Physiologist;Susanne Bice, RN, BSN, Laveda Norman, BS, ACSM CEP, Exercise Physiologist    Supervising physician immediately available to respond to emergencies  See telemetry face sheet for immediately available ER MD    Medication changes reported      No    Fall or balance concerns reported     No    Tobacco Cessation  No Change    Warm-up and Cool-down  Performed on first and last piece of equipment    Resistance Training Performed  Yes    VAD Patient?  No      Pain Assessment   Currently in Pain?  No/denies    Multiple Pain Sites  No          Social History   Tobacco Use  Smoking Status Former Smoker  . Last attempt to quit: 1977  . Years since quitting: 42.4  Smokeless Tobacco Never Used  Tobacco Comment   quit in 78, teenager    Goals Met:  Independence with exercise equipment Exercise tolerated well No report of cardiac concerns or symptoms Strength training completed today  Goals Unmet:  Not Applicable  Comments: Pt able to follow exercise prescription today without complaint.  Will continue to monitor for progression.    Dr. Emily Filbert is Medical Director for [place] and [facility].

## 2017-08-25 ENCOUNTER — Encounter: Payer: Self-pay | Admitting: Physician Assistant

## 2017-08-25 ENCOUNTER — Other Ambulatory Visit: Payer: Self-pay | Admitting: *Deleted

## 2017-08-25 DIAGNOSIS — I38 Endocarditis, valve unspecified: Secondary | ICD-10-CM

## 2017-08-25 MED ORDER — AMOXICILLIN 500 MG PO CAPS: 2000.0000 mg | ORAL_CAPSULE | Freq: Once | ORAL | 2 refills | Status: AC

## 2017-08-26 ENCOUNTER — Encounter: Payer: Self-pay | Admitting: *Deleted

## 2017-08-26 ENCOUNTER — Encounter: Payer: Managed Care, Other (non HMO) | Admitting: *Deleted

## 2017-08-26 DIAGNOSIS — Z9889 Other specified postprocedural states: Secondary | ICD-10-CM | POA: Diagnosis not present

## 2017-08-26 NOTE — Progress Notes (Signed)
Cardiac Individual Treatment Plan  Patient Details  Name: Todd Frank MRN: [id number] Date of Birth: [birth year] Referring Provider:     Cardiac Rehab from 07/27/2017 in [facility] Cardiac and Pulmonary Rehab  Referring Provider  [name] MD      Initial Encounter Date:    Cardiac Rehab from 07/27/2017 in [facility] Cardiac and Pulmonary Rehab  Date  07/27/17  Referring Provider  [name] MD      Visit Diagnosis: S/P aortic valve repair  Patient's Home Medications on Admission:  Current Outpatient Medications:  .  aspirin EC 81 MG tablet, Take 81 mg by mouth daily., Disp: , Rfl:  .  atorvastatin (LIPITOR) 80 MG tablet, Take 1 tablet (80 mg total) by mouth daily., Disp: 90 tablet, Rfl: 3 .  Coenzyme Q10 (COQ-10) 200 MG CAPS, Take 200 mg by mouth daily., Disp: , Rfl:  .  fluticasone (FLONASE) 50 MCG/ACT nasal spray, Place 2 sprays into both nostrils daily. (Patient taking differently: Place 2 sprays into both nostrils daily as needed (for allergies.). ), Disp: 16 g, Rfl: 12 .  metoprolol tartrate (LOPRESSOR) 25 MG tablet, Take 0.5 tablets (12.5 mg total) by mouth 2 (two) times daily., Disp: 60 tablet, Rfl: 6  Past Medical History: Past Medical History:  Diagnosis Date  . Aortic stenosis    a. syncope/severe AS s/p bioprosthetic AVR 06/2017.  . Arthritis   . Bicuspid aortic valve   . Bradycardia 07/23/2015  . Broken clavicle    as child, can still dislocate at times  . CAD (coronary artery disease), native coronary artery    a. s/p 2V CABG at time of AVR 06/2017.  . Carotid bruit    carotid dopplers negative.  Bruit due to heart murmur from AS  . Congenital nevus   . Diastolic dysfunction   . Dilated aortic root ([place])    47m by echo 02/2016  . GERD (gastroesophageal reflux disease)   . Headache   . HTN (hypertension)   . Hyperlipidemia    LDL goal < 70  . LVH (left ventricular hypertrophy)   . [place]anoma of [place] ([place]   . Obesity   . PFO (patent foramen ovale)    small  by echo 2014, but not demonstrated during intra-op note from CABG/AVR 06/2017.    Tobacco Use: Social History   Tobacco Use  Smoking Status Former Smoker  . Last attempt to quit: 1977  . Years since quitting: 42.4  Smokeless Tobacco Never Used  Tobacco Comment   quit in 78, teenager    Labs: Recent Review Flowsheet Data    Labs for ITP Cardiac and Pulmonary Rehab Latest Ref Rng & Units 06/25/2017 06/25/2017 06/25/2017 06/25/2017 06/26/2017   Cholestrol 100 - 199 mg/dL - - - - -   LDLCALC 0 - 99 mg/dL - - - - -   HDL >39 mg/dL - - - - -   Trlycerides 0 - 149 mg/dL - - - - -   Hemoglobin A1c 4.8 - 5.6 % - - - - -   PHART 7.350 - 7.450 7.366 7.349(L) - 7.311(L) -   PCO2ART 32.0 - 48.0 mmHg 40.3 39.2 - 47.0 -   HCO3 20.0 - 28.0 mmol/L 23.5 21.6 - 23.8 -   TCO2 22 - 32 mmol/L 25 23 23 25 23    ACIDBASEDEF 0.0 - 2.0 mmol/L 2.0 4.0(H) - 3.0(H) -   O2SAT % 97.0 98.0 - 98.0 -       Exercise Target Goals:  Exercise Program Goal: Individual exercise prescription set using results from initial 6 min walk test and THRR while considering  patient's activity barriers and safety.   Exercise Prescription Goal: Initial exercise prescription builds to 30-45 minutes a day of aerobic activity, 2-3 days per week.  Home exercise guidelines will be given to patient during program as part of exercise prescription that the participant will acknowledge.  Activity Barriers & Risk Stratification: Activity Barriers & Cardiac Risk Stratification - 07/27/17 1247      Activity Barriers & Cardiac Risk Stratification   Activity Barriers  Joint Problems R shoulder, history of broken collar bone    Cardiac Risk Stratification  Moderate       6 Minute Walk: 6 Minute Walk    Row Name 07/27/17 1406         6 Minute Walk   Phase  Initial     Distance  1550 feet     Walk Time  6 minutes     # of Rest Breaks  0     MPH  2.94     METS  3.93     RPE  10     VO2 Peak  13.74     Symptoms  No      Resting HR  60 bpm     Resting BP  134/74     Resting Oxygen Saturation   99 %     Exercise Oxygen Saturation  during 6 min walk  98 %     Max Ex. HR  94 bpm     Max Ex. BP  142/78     2 Minute Post BP  126/68        Oxygen Initial Assessment:   Oxygen Re-Evaluation:   Oxygen Discharge (Final Oxygen Re-Evaluation):   Initial Exercise Prescription: Initial Exercise Prescription - 07/27/17 1400      Date of Initial Exercise RX and Referring Provider   Date  07/27/17    Referring Provider  [name] MD      Treadmill   MPH  2.9    Grade  1    Minutes  15    METs  3.62      REL-XR   Level  2    Speed  50    Minutes  15    METs  3.6      T5 Nustep   Level  3    SPM  80    Minutes  15    METs  3.6      Prescription Details   Frequency (times per week)  3    Duration  Progress to 45 minutes of aerobic exercise without signs/symptoms of physical distress      Intensity   THRR 40-80% of Max Heartrate  99-138    Ratings of Perceived Exertion  11-13    Perceived Dyspnea  0-4      Progression   Progression  Continue to progress workloads to maintain intensity without signs/symptoms of physical distress.      Resistance Training   Training Prescription  Yes    Weight  4 lbs    Reps  10-15       Perform Capillary Blood Glucose checks as needed.  Exercise Prescription Changes: Exercise Prescription Changes    Row Name 07/27/17 1400 08/05/17 1500 08/07/17 0900 08/17/17 1500       Response to Exercise   Blood Pressure (Admit)  134/74  134/62  -  110/60  Blood Pressure (Exercise)  142/78  146/80  -  142/80    Blood Pressure (Exit)  126/68  124/70  -  114/68    Heart Rate (Admit)  60 bpm  88 bpm  -  88 bpm    Heart Rate (Exercise)  94 bpm  123 bpm  -  107 bpm    Heart Rate (Exit)  61 bpm  71 bpm  -  78 bpm    Oxygen Saturation (Admit)  99 %  -  -  -    Oxygen Saturation (Exercise)  98 %  -  -  -    Rating of Perceived Exertion (Exercise)  10  12  -   12    Symptoms  none  none  -  none    Comments  walk test results  -  -  -    Duration  -  Continue with 45 min of aerobic exercise without signs/symptoms of physical distress.  -  Continue with 45 min of aerobic exercise without signs/symptoms of physical distress.    Intensity  -  THRR unchanged  -  THRR unchanged      Progression   Progression  -  Continue to progress workloads to maintain intensity without signs/symptoms of physical distress.  -  Continue to progress workloads to maintain intensity without signs/symptoms of physical distress.    Average METs  -  3.64  -  4.63      Resistance Training   Training Prescription  -  Yes  -  Yes    Weight  -  5 lbs  -  5 lbs    Reps  -  10-15  -  10-15      Interval Training   Interval Training  -  No  -  No      Treadmill   MPH  -  2.9  -  3.2    Grade  -  1  -  1    Minutes  -  15  -  15    METs  -  3.62  -  3.89      REL-XR   Level  -  5  -  8    Minutes  -  15  -  15    METs  -  4.1  -  5.7      T5 Nustep   Level  -  3  -  4    Minutes  -  15  -  15    METs  -  3.2  -  4.3      Home Exercise Plan   Plans to continue exercise at  -  -  [employer] (comment) treadmill and [employer] (comment) treadmill and [employer]    Frequency  -  -  Add 2 additional days to program exercise sessions.  Add 2 additional days to program exercise sessions.    Initial Home Exercises Provided  -  -  08/07/17  08/07/17       Exercise Comments: Exercise Comments    Row Name 07/29/17 0752 08/21/17 0825         Exercise Comments  First full day of exercise!  Patient was oriented to gym and equipment including functions, settings, policies, and procedures.  Patient's individual exercise prescription and treatment plan were reviewed.  All starting workloads were established based on the results of the 6 minute walk test done at  initial orientation visit.  The plan for exercise progression was also introduced and progression will  be customized based on patient's performance and goals.  Started intervals today         Exercise Goals and Review: Exercise Goals    Row Name 07/27/17 1423             Exercise Goals   Increase Physical Activity  Yes       Intervention  Provide advice, education, support and counseling about physical activity/exercise needs.;Develop an individualized exercise prescription for aerobic and resistive training based on initial evaluation findings, risk stratification, comorbidities and participant's personal goals.       Expected Outcomes  Short Term: Attend rehab on a regular basis to increase amount of physical activity.;Long Term: Add in home exercise to make exercise part of routine and to increase amount of physical activity.;Long Term: Exercising regularly at least 3-5 days a week.       Increase Strength and Stamina  Yes       Intervention  Provide advice, education, support and counseling about physical activity/exercise needs.;Develop an individualized exercise prescription for aerobic and resistive training based on initial evaluation findings, risk stratification, comorbidities and participant's personal goals.       Expected Outcomes  Short Term: Increase workloads from initial exercise prescription for resistance, speed, and METs.;Short Term: Perform resistance training exercises routinely during rehab and add in resistance training at home;Long Term: Improve cardiorespiratory fitness, muscular endurance and strength as measured by increased METs and functional capacity (6MWT)       Able to understand and use rate of perceived exertion (RPE) scale  Yes       Intervention  Provide education and explanation on how to use RPE scale       Expected Outcomes  Short Term: Able to use RPE daily in rehab to express subjective intensity level;Long Term:  Able to use RPE to guide intensity level when exercising independently       Knowledge and understanding of Target Heart Rate Range (THRR)   Yes       Intervention  Provide education and explanation of THRR including how the numbers were predicted and where they are located for reference       Expected Outcomes  Short Term: Able to state/look up THRR;Short Term: Able to use daily as guideline for intensity in rehab;Long Term: Able to use THRR to govern intensity when exercising independently       Able to check pulse independently  Yes       Intervention  Provide education and demonstration on how to check pulse in carotid and radial arteries.;Review the importance of being able to check your own pulse for safety during independent exercise       Expected Outcomes  Short Term: Able to explain why pulse checking is important during independent exercise;Long Term: Able to check pulse independently and accurately       Understanding of Exercise Prescription  Yes       Intervention  Provide education, explanation, and written materials on patient's individual exercise prescription       Expected Outcomes  Short Term: Able to explain program exercise prescription;Long Term: Able to explain home exercise prescription to exercise independently          Exercise Goals Re-Evaluation : Exercise Goals Re-Evaluation    Row Name 07/29/17 0752 08/05/17 1509 08/07/17 0946 08/14/17 1129       Exercise Goal Re-Evaluation   Exercise Goals  Review  Understanding of Exercise Prescription;Knowledge and understanding of Target Heart Rate Range (THRR);Able to understand and use rate of perceived exertion (RPE) scale  Increase Physical Activity;Understanding of Exercise Prescription;Increase Strength and Stamina  Increase Physical Activity;Able to understand and use rate of perceived exertion (RPE) scale;Knowledge and understanding of Target Heart Rate Range (THRR);Able to check pulse independently;Understanding of Exercise Prescription  Increase Physical Activity;Understanding of Exercise Prescription;Increase Strength and Stamina    Comments  Reviewed RPE  scale, THR and program prescription with pt today.  Pt voiced understanding and was given a copy of goals to take home.   Ronalee Belts is off to a good start in rehab.  He has completed Four full days of exercise.  He has already moved up to level 5 on the XR and is using 5 lbs weights. We will continue to monitor his progress.   Reviewed home exercise with pt today.  Pt plans to use his treadmill at home and continue to go to [facility] \ for exercise.  Reviewed THR, pulse, RPE, sign and symptoms, NTG use, and when to call 911 or MD.  Also discussed weather considerations and indoor options.  Pt voiced understanding.  Ronalee Belts has been doing well in rehab.  He is feeling stronger and has more energy.  He is doing his treadmill daily for 35mn twice a day, sometimes even three times a day!  He is planning to continue to come to rehab.     Expected Outcomes  Short: Use RPE daily to regulate intensity.  Long: Follow program prescription in THR.  Short: Reveiw home exercise guidelines.  Long: Continue to attend program regularly.   Short: Add in at least two days of exercise at home.  Long: Continue to build strength and stamina.   Short: Continue to use treadmill at home.  Long: Continue to exercise independently       Discharge Exercise Prescription (Final Exercise Prescription Changes): Exercise Prescription Changes - 08/17/17 1500      Response to Exercise   Blood Pressure (Admit)  110/60    Blood Pressure (Exercise)  142/80    Blood Pressure (Exit)  114/68    Heart Rate (Admit)  88 bpm    Heart Rate (Exercise)  107 bpm    Heart Rate (Exit)  78 bpm    Rating of Perceived Exertion (Exercise)  12    Symptoms  none    Duration  Continue with 45 min of aerobic exercise without signs/symptoms of physical distress.    Intensity  THRR unchanged      Progression   Progression  Continue to progress workloads to maintain intensity without signs/symptoms of physical distress.    Average METs  4.63      Resistance  Training   Training Prescription  Yes    Weight  5 lbs    Reps  10-15      Interval Training   Interval Training  No      Treadmill   MPH  3.2    Grade  1    Minutes  15    METs  3.89      REL-XR   Level  8    Minutes  15    METs  5.7      T5 Nustep   Level  4    Minutes  15    METs  4.3      Home Exercise Plan   [facility] [employer]    Frequency  Add 2 additional days to program exercise sessions.    Initial Home Exercises Provided  08/07/17       Nutrition:  Target Goals: Understanding of nutrition guidelines, daily intake of sodium <1564m, cholesterol <2024m calories 30% from fat and 7% or less from saturated fats, daily to have 5 or more servings of fruits and vegetables.  Biometrics: Pre Biometrics - 07/27/17 1423      Pre Biometrics   Height  5' 10.5" (1.791 m)    Weight  185 lb 8 oz (84.1 kg)    Waist Circumference  33 inches    Hip Circumference  38.5 inches    Waist to Hip Ratio  0.86 %    BMI (Calculated)  26.23    Single Leg Stand  30 seconds        Nutrition Therapy Plan and Nutrition Goals: Nutrition Therapy & Goals - 08/17/17 1001      Nutrition Therapy   Diet  TLC    Drug/Food Interactions  Statins/Certain Fruits    Protein (specify units)  10oz    Fiber  30 grams    Whole Grain Foods  3 servings    Saturated Fats  15 max. grams    Fruits and Vegetables  6 servings/day 8 ideal    Sodium  2000 grams      Personal Nutrition Goals   Nutrition Goal  Transition to cooking with fats that are liquid at room temperature such as olive oil. Choose spread-able butters rather than traditional stick butter more often    Personal Goal #2  Work to increase variety of meals. Structure meals according to the plate method as discussed, and use this as a tool to cover nutritional bases while still allowing yourself variety as you transition to a more whole-food based style of eating    Personal  Goal #3  For the occasional snack, choose snacks that are high in protein such as [place]urt, nuts, lower-fat cheese, peanut butter crackers, and cottage cheese    Comments  He has made several positive changes to his diet since hospitalization in March 2019 which has also resulted in weight loss which he desires. He is on track to achieving his goals.      Intervention Plan   Intervention  Nutrition handout(s) given to patient.;Prescribe, educate and counsel regarding individualized specific dietary modifications aiming towards targeted core components such as weight, hypertension, lipid management, diabetes, heart failure and other comorbidities. Gave following a low sodium diet handout    Expected Outcomes  Short Term Goal: Understand basic principles of dietary content, such as calories, fat, sodium, cholesterol and nutrients.;Short Term Goal: A plan has been developed with personal nutrition goals set during dietitian appointment.;Long Term Goal: Adherence to prescribed nutrition plan.       Nutrition Assessments: Nutrition Assessments - 07/27/17 1241      MEDFICTS Scores   Pre Score  18       Nutrition Goals Re-Evaluation: Nutrition Goals Re-Evaluation    [place] 08/14/17 1146 08/17/17 1008           Goals   Nutrition Goal  Meet with dietician  Work to increase variety of meals. Structure meals according to the plate method as discussed, and use this as a tool to cover nutritional bases while still allowing yourself variety as you transition to a more whole-food based style of eating      Comment  MiRonalee Beltss scheduled to  meet with Lattie Haw during class on Monday. He just wants to make sure he is doing what he is supposed to for his diet.  Since choosing healthier meal options he is starting to feel restricted to the same few foods and desires to learn about how to incorportate more variety while still following a heart healthy diet      Expected Outcome  Short: meet with dietician.   Long: Continue to follow heart healthy diet  He will choose a variety of vegetables, complex carbohydrates / whole grain starches, and proteins that will both meet his nutritional needs and provide him the variety he is looking for, which will help him stay on track towards his health goals        Personal Goal #2 Re-Evaluation   Personal Goal #2  -  Transition to cooking with fats that are liquid at room temperature such as olive oil. Choose spread-able butters rather than traditional stick butter more often        Personal Goal #3 Re-Evaluation   Personal Goal #3  -  For the occasional snack, choose options that are high in protein such as [place] yogurt, nuts, lower-fat cheese, peanut butter crackers, and cottage cheese         Nutrition Goals Discharge (Final Nutrition Goals Re-Evaluation): Nutrition Goals Re-Evaluation - 08/17/17 1008      Goals   Nutrition Goal  Work to increase variety of meals. Structure meals according to the plate method as discussed, and use this as a tool to cover nutritional bases while still allowing yourself variety as you transition to a more whole-food based style of eating    Comment  Since choosing healthier meal options he is starting to feel restricted to the same few foods and desires to learn about how to incorportate more variety while still following a heart healthy diet    Expected Outcome  He will choose a variety of vegetables, complex carbohydrates / whole grain starches, and proteins that will both meet his nutritional needs and provide him the variety he is looking for, which will help him stay on track towards his health goals      Personal Goal #2 Re-Evaluation   Personal Goal #2  Transition to cooking with fats that are liquid at room temperature such as olive oil. Choose spread-able butters rather than traditional stick butter more often      Personal Goal #3 Re-Evaluation   Personal Goal #3  For the occasional snack, choose options that are  high in protein such as [place] yogurt, nuts, lower-fat cheese, peanut butter crackers, and cottage cheese       Psychosocial: Target Goals: Acknowledge presence or absence of significant depression and/or stress, maximize coping skills, provide positive support system. Participant is able to verbalize types and ability to use techniques and skills needed for reducing stress and depression.   Initial Review & Psychosocial Screening: Initial Psych Review & Screening - 07/27/17 1242      Initial Review   Current issues with  Current Stress Concerns Mr. Amores has some concerns about his heart post surgery, but is excited to get back to exercising and his routine. He gets a little nervous with the different "twinges and weird feelings coming from the heart"      [place]?  Yes spouse, sister, friends      Screening Interventions   Interventions  Encouraged to exercise;Program counselor consult;To provide support and resources with identified psychosocial  needs    Expected Outcomes  Short Term goal: Utilizing psychosocial counselor, staff and physician to assist with identification of specific Stressors or current issues interfering with healing process. Setting desired goal for each stressor or current issue identified.;Long Term Goal: Stressors or current issues are controlled or eliminated.;Short Term goal: Identification and review with participant of any Quality of Life or Depression concerns found by scoring the questionnaire.;Long Term goal: The participant improves quality of Life and PHQ9 Scores as seen by post scores and/or verbalization of changes       Quality of Life Scores:  Quality of Life - 07/27/17 1244      Quality of Life Scores   Health/Function Pre  26.03 %    Socioeconomic Pre  28.93 %    Psych/Spiritual Pre  27.58 %    Family Pre  25.5 %    GLOBAL Pre  26.89 %      Scores of 19 and below usually indicate a poorer quality of life in these  areas.  A difference of  2-3 points is a clinically meaningful difference.  A difference of 2-3 points in the total score of the Quality of Life Index has been associated with significant improvement in overall quality of life, self-image, physical symptoms, and general health in studies assessing change in quality of life.  PHQ-9: Recent Review Flowsheet Data    Depression screen [facility] 2/9 07/27/2017 02/06/2017 01/09/2016 02/02/2015   Decreased Interest 0 0 0 0   Down, Depressed, Hopeless 0 0 0 0   PHQ - 2 Score 0 0 0 0   Altered sleeping 1 - - -   Tired, decreased energy 0 - - -   Change in appetite 0 - - -   Feeling bad or failure about yourself  0 - - -   Trouble concentrating 0 - - -   Moving slowly or fidgety/restless 0 - - -   Suicidal thoughts 0 - - -   PHQ-9 Score 1 - - -   Difficult doing work/chores Not difficult at all - - -     Interpretation of Total Score  Total Score Depression Severity:  1-4 = Minimal depression, 5-9 = Mild depression, 10-14 = Moderate depression, 15-19 = Moderately severe depression, 20-27 = Severe depression   Psychosocial Evaluation and Intervention: Psychosocial Evaluation - 07/29/17 0946      Psychosocial Evaluation & Interventions   Interventions  Encouraged to exercise with the program and follow exercise prescription;Stress management education    Comments  Counselor met with Mr. Angela Adam Ronalee Belts) today for initial psychosocial evaluation.  He is a 62 year old who had a recent aortic valve replaced and a CABGx2.  He has a strong support system with a spouse of 9 years and his sister and parents live locally.  He reports being in fairly good health other than some arthritis in his knee and shoulder and he sleeps well and has a good appetite.  Ronalee Belts denies a history of depression or anxiety or any current symptoms and states he is typically in a positive mood most of the time.  Stress currently are his health and being on short term disability - not being  able to drive himself around.  He has goals to lose some weight while in this program and become more physically fit overall.  Staff will follow with him.     Expected Outcomes  Short:  Ronalee Belts will meet with the dietician to address his weight loss goals.  Long:  Ronalee Belts will exercise consistently to increase his stamina and strength and return to more normal activities in his life.      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 He sees his surgeon on Monday and is hoping to get back to work.  He would like to be able to continue to attend rehab even after returning to work.  He continues to sleep well and is enjoying coming to the program.         Expected Outcomes  Short: See surgeon to get clearance to return to work.  Long: Continue to stay postive.           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 He sees his surgeon on Monday and is hoping to get back to work.  He would like to be able to continue to attend rehab even after returning to work.  He continues to sleep well and is enjoying coming to the program.      Expected Outcomes  Short: See surgeon to get clearance to return to work.  Long: Continue to stay postive.        Vocational Rehabilitation: Provide vocational rehab assistance to qualifying candidates.   Vocational Rehab Evaluation & Intervention: Vocational Rehab - 07/27/17 1245      Initial Vocational Rehab Evaluation & Intervention   Assessment  shows need for Vocational Rehabilitation  No       Education: Education Goals: Education classes will be provided on a variety of topics geared toward better understanding of heart health and risk factor modification. Participant will state understanding/return demonstration of topics presented as noted by education test scores.  Learning Barriers/Preferences: Learning Barriers/Preferences - 07/27/17 1245      Learning Barriers/Preferences   Learning Barriers  None    Learning Preferences  None       Education Topics:  AED/CPR: - Group verbal and written instruction with the use of models to demonstrate the basic use of the AED with the basic ABC's of resuscitation.   General Nutrition Guidelines/Fats and Fiber: -Group instruction provided by verbal, written material, models and posters to present the general guidelines for heart healthy nutrition. Gives an explanation and review of dietary fats and fiber.   Cardiac Rehab from 08/24/2017 in [facility] Cardiac and Pulmonary Rehab  Date  08/24/17  Educator  CR  Instruction Review Code  1- Verbalizes Understanding      Controlling Sodium/Reading Food Labels: -Group verbal and written material supporting the discussion of sodium use in heart healthy nutrition. Review and explanation with models, verbal and written materials for utilization of the food label.   Exercise Physiology & General Exercise Guidelines: - Group verbal and written instruction with models to review the exercise physiology of the cardiovascular system and associated critical values. Provides general exercise guidelines with specific guidelines to those with heart or lung disease.    Aerobic Exercise &  Resistance Training: - Gives group verbal and written instruction on the various components of exercise. Focuses on aerobic and resistive training programs and the benefits of this training and how to safely progress through these programs..   Flexibility, Balance,  Mind/Body Relaxation: Provides group verbal/written instruction on the benefits of flexibility and balance training, including mind/body exercise modes such as yoga, pilates and tai chi.  Demonstration and skill practice provided.   Stress and Anxiety: - Provides group verbal and written instruction about the health risks of elevated stress and causes of high stress.  Discuss the correlation between heart/lung disease and anxiety and treatment options. Review healthy ways to manage with stress and anxiety.   Cardiac Rehab from 08/24/2017 in [facility] Cardiac and Pulmonary Rehab  Date  08/05/17  Educator  [facility]  Instruction Review Code  1- Verbalizes Understanding      Depression: - Provides group verbal and written instruction on the correlation between heart/lung disease and depressed mood, treatment options, and the stigmas associated with seeking treatment.   Anatomy & Physiology of the Heart: - Group verbal and written instruction and models provide basic cardiac anatomy and physiology, with the coronary electrical and arterial systems. Review of Valvular disease and Heart Failure   Cardiac Rehab from 08/24/2017 in [facility] Cardiac and Pulmonary Rehab  Date  08/03/17  Educator  SB  Instruction Review Code  1- Verbalizes Understanding      Cardiac Procedures: - Group verbal and written instruction to review commonly prescribed medications for heart disease. Reviews the medication, class of the drug, and side effects. Includes the steps to properly store meds and maintain the prescription regimen. (beta blockers and nitrates)   Cardiac Rehab from 08/24/2017 in [facility] Cardiac and Pulmonary Rehab  Date  08/17/17  Educator  SB  Instruction Review Code  1- Verbalizes Understanding      Cardiac Medications I: - Group verbal and written instruction to review commonly prescribed medications for heart disease. Reviews the medication, class of the drug, and side effects. Includes the steps to properly  store meds and maintain the prescription regimen.   Cardiac Rehab from 08/24/2017 in [facility] Cardiac and Pulmonary Rehab  Date  08/10/17  Educator  SB  Instruction Review Code  1- Verbalizes Understanding      Cardiac Medications II: -Group verbal and written instruction to review commonly prescribed medications for heart disease. Reviews the medication, class of the drug, and side effects. (all other drug classes)   Cardiac Rehab from 08/24/2017 in [facility] Cardiac and Pulmonary Rehab  Date  07/29/17  Educator  [facility]  Instruction Review Code  1- Verbalizes Understanding       Go Sex-Intimacy & Heart Disease, Get SMART - Goal Setting: - Group verbal and written instruction through game format to discuss heart disease and the return to sexual intimacy. Provides group verbal and written material to discuss and apply goal setting through the application of the S.M.A.R.T. Method.   Cardiac Rehab from 08/24/2017 in [facility] Cardiac and Pulmonary Rehab  Date  08/17/17  Educator  SB  Instruction Review Code  1- Verbalizes Understanding      Other Matters of the Heart: - Provides group verbal, written materials and models to describe Stable Angina and Peripheral Artery. Includes description of the disease process and treatment options available to the cardiac patient.   Cardiac Rehab from 08/24/2017 in [facility] Cardiac and Pulmonary Rehab  Date  08/03/17  Educator  SB  Instruction Review Code  1- Verbalizes Understanding  Exercise & Equipment Safety: - Individual verbal instruction and demonstration of equipment use and safety with use of the equipment.   Cardiac Rehab from 08/24/2017 in [facility] Cardiac and Pulmonary Rehab  Date  07/27/17  Educator  [facility]  Instruction Review Code  1- Verbalizes Understanding      Infection Prevention: - Provides verbal and written material to individual with discussion of infection control including proper hand washing and proper equipment cleaning during exercise  session.   Cardiac Rehab from 08/24/2017 in [facility] Cardiac and Pulmonary Rehab  Date  07/27/17  Educator  [facility]  Instruction Review Code  1- Verbalizes Understanding      Falls Prevention: - Provides verbal and written material to individual with discussion of falls prevention and safety.   Cardiac Rehab from 08/24/2017 in [facility] Cardiac and Pulmonary Rehab  Date  07/27/17  Educator  [facility]  Instruction Review Code  1- Verbalizes Understanding      Diabetes: - Individual verbal and written instruction to review signs/symptoms of diabetes, desired ranges of glucose level fasting, after meals and with exercise. Acknowledge that pre and post exercise glucose checks will be done for 3 sessions at entry of program.   Know Your Numbers and Risk Factors: -Group verbal and written instruction about important numbers in your health.  Discussion of what are risk factors and how they play a role in the disease process.  Review of Cholesterol, Blood Pressure, Diabetes, and BMI and the role they play in your overall health.   Cardiac Rehab from 08/24/2017 in [facility] Cardiac and Pulmonary Rehab  Date  07/29/17  Educator  [facility]  Instruction Review Code  1- Verbalizes Understanding      Sleep Hygiene: -Provides group verbal and written instruction about how sleep can affect your health.  Define sleep hygiene, discuss sleep cycles and impact of sleep habits. Review good sleep hygiene tips.    Cardiac Rehab from 08/24/2017 in [facility] Cardiac and Pulmonary Rehab  Date  08/19/17  Educator  [facility]  Instruction Review Code  1- Verbalizes Understanding      Other: -Provides group and verbal instruction on various topics (see comments)   Knowledge Questionnaire Score: Knowledge Questionnaire Score - 07/27/17 1245      Knowledge Questionnaire Score   Pre Score  28/28       Core Components/Risk Factors/Patient Goals at Admission: Personal Goals and Risk Factors at Admission - 07/27/17 1237      Core Components/Risk  Factors/Patient Goals on Admission    Weight Management  Yes;Weight Loss    Intervention  Weight Management: Develop a combined nutrition and exercise program designed to reach desired caloric intake, while maintaining appropriate intake of nutrient and fiber, sodium and fats, and appropriate energy expenditure required for the weight goal.;Weight Management: Provide education and appropriate resources to help participant work on and attain dietary goals.;Weight Management/Obesity: Establish reasonable short term and long term weight goals.    Admit Weight  185 lb (83.9 kg)    Goal Weight: Short Term  180 lb (81.6 kg)    Goal Weight: Long Term  160 lb (72.6 kg)    Expected Outcomes  Short Term: Continue to assess and modify interventions until short term weight is achieved;Long Term: Adherence to nutrition and physical activity/exercise program aimed toward attainment of established weight goal;Weight Loss: Understanding of general recommendations for a balanced deficit meal plan, which promotes 1-2 lb weight loss per week and includes a negative energy balance of [phone] kcal/d;Understanding recommendations for meals  to include 15-35% energy as protein, 25-35% energy from fat, 35-60% energy from carbohydrates, less than 215m of dietary cholesterol, 20-35 gm of total fiber daily;Understanding of distribution of calorie intake throughout the day with the consumption of 4-5 meals/snacks    Hypertension  Yes    Intervention  Provide education on lifestyle modifcations including regular physical activity/exercise, weight management, moderate sodium restriction and increased consumption of fresh fruit, vegetables, and low fat dairy, alcohol moderation, and smoking cessation.;Monitor prescription use compliance.    Expected Outcomes  Short Term: Continued assessment and intervention until BP is < 140/967mHG in hypertensive participants. < 130/8079mG in hypertensive participants with diabetes, heart failure or  chronic kidney disease.;Long Term: Maintenance of blood pressure at goal levels.    Lipids  Yes    Intervention  Provide education and support for participant on nutrition & aerobic/resistive exercise along with prescribed medications to achieve LDL <52m66mDL >40mg34m Expected Outcomes  Short Term: Participant states understanding of desired cholesterol values and is compliant with medications prescribed. Participant is following exercise prescription and nutrition guidelines.;Long Term: Cholesterol controlled with medications as prescribed, with individualized exercise RX and with personalized nutrition plan. Value goals: LDL < 52mg,51m > 40 mg.       Core Components/Risk Factors/Patient Goals Review:  Goals and Risk Factor Review    Row Name 08/14/17 1143             Core Components/Risk Factors/Patient Goals Review   Personal Goals Review  Weight Management/Obesity;Lipids;Hypertension       Review  Mike hRonalee Beltseen doing well with rehab.  He is weight is coming down slowly.  He is losing between 1-2 lbs a week!!  Blood pressures have all been good and he is checking it at home.  He is doing well on all of his medications.        Expected Outcomes  Short: Continue to work on weight loss.  Long: Continue to monitor risk factors.           Core Components/Risk Factors/Patient Goals at Discharge (Final Review):  Goals and Risk Factor Review - 08/14/17 1143      Core Components/Risk Factors/Patient Goals Review   Personal Goals Review  Weight Management/Obesity;Lipids;Hypertension    Review  Mike hRonalee Beltseen doing well with rehab.  He is weight is coming down slowly.  He is losing between 1-2 lbs a week!!  Blood pressures have all been good and he is checking it at home.  He is doing well on all of his medications.     Expected Outcomes  Short: Continue to work on weight loss.  Long: Continue to monitor risk factors.        ITP Comments: ITP Comments    Row Name 07/27/17 1231 07/29/17  0603 08/26/17 0550       ITP Comments  Med Review completed. Initial ITP created. Diagnosis can be found in [facility]  30 day review. Continue with ITP unless directed changes per Medical Director     New to program  30 day review. Continue with ITP unless directed changes per Medical Director        Comments:

## 2017-08-26 NOTE — Progress Notes (Signed)
Daily Session Note  Patient Details  Name: Todd Frank MRN: [id number] Date of Birth: Dec 01, 1955 Referring Provider:     Cardiac Rehab from 07/27/2017 in [facility] Cardiac and Pulmonary Rehab  Referring Provider  [name] MD      Encounter Date: 08/26/2017  Check In: Session Check In - 08/26/17 0914      Check-In   Location  [facility]-Cardiac & Pulmonary Rehab    Staff Present  Heath Lark, RN, BSN, CCRP;Meredith Sherryll Burger, RN BSN;Allana Shrestha Luan Pulling, MA, RCEP, CCRP, Exercise Physiologist    Supervising physician immediately available to respond to emergencies  See telemetry face sheet for immediately available ER MD    Medication changes reported      No    Fall or balance concerns reported     No    Warm-up and Cool-down  Performed on first and last piece of equipment    Resistance Training Performed  Yes    VAD Patient?  No      Pain Assessment   Currently in Pain?  No/denies          Social History   Tobacco Use  Smoking Status Former Smoker  . Last attempt to quit: 1977  . Years since quitting: 42.4  Smokeless Tobacco Never Used  Tobacco Comment   quit in 78, teenager    Goals Met:  Independence with exercise equipment Exercise tolerated well No report of cardiac concerns or symptoms Strength training completed today  Goals Unmet:  Not Applicable  Comments: Pt able to follow exercise prescription today without complaint.  Will continue to monitor for progression.    Dr. Emily Filbert is Medical Director for [facility].

## 2017-08-28 ENCOUNTER — Ambulatory Visit: Payer: Managed Care, Other (non HMO) | Admitting: Cardiology

## 2017-08-28 VITALS — BP 138/82 | HR 72 | Ht 69.5 in | Wt 179.0 lb

## 2017-08-28 DIAGNOSIS — I10 Essential (primary) hypertension: Secondary | ICD-10-CM

## 2017-08-28 DIAGNOSIS — I2510 Atherosclerotic heart disease of native coronary artery without angina pectoris: Secondary | ICD-10-CM

## 2017-08-28 DIAGNOSIS — I77810 Thoracic aortic ectasia: Secondary | ICD-10-CM | POA: Diagnosis not present

## 2017-08-28 DIAGNOSIS — I350 Nonrheumatic aortic (valve) stenosis: Secondary | ICD-10-CM | POA: Diagnosis not present

## 2017-08-28 DIAGNOSIS — E7800 Pure hypercholesterolemia, unspecified: Secondary | ICD-10-CM | POA: Diagnosis not present

## 2017-08-28 DIAGNOSIS — R55 Syncope and collapse: Secondary | ICD-10-CM | POA: Diagnosis not present

## 2017-08-28 LAB — LIPID PANEL
CHOL/HDL RATIO: 2 ratio (ref 0.0–5.0)
Cholesterol, Total: 104 mg/dL (ref 100–199)
HDL: 51 mg/dL (ref >39)
LDL CALC: 41 mg/dL (ref 0–99)
Triglycerides: 58 mg/dL (ref 0–149)
VLDL Cholesterol Cal: 12 mg/dL (ref 5–40)

## 2017-08-28 LAB — HEPATIC FUNCTION PANEL
ALBUMIN: 4.6 g/dL (ref 3.6–4.8)
ALK PHOS: 103 IU/L (ref 39–117)
ALT: 26 IU/L (ref 0–44)
AST: 27 IU/L (ref 0–40)
BILIRUBIN TOTAL: 0.9 mg/dL (ref 0.0–1.2)
BILIRUBIN, DIRECT: 0.25 mg/dL (ref 0.00–0.40)
TOTAL PROTEIN: 6.7 g/dL (ref 6.0–8.5)

## 2017-08-28 NOTE — Progress Notes (Addendum)
Cardiology Office Note:    Date:  08/28/2017   ID:  [name], [name] Nov 25, 1955, MRN [id number]  PCP:  Olin Hauser, DO  Cardiologist:  [name], MD    Referring MD: Nobie Putnam *   Chief Complaint  Patient presents with  . Coronary Artery Disease  . Hypertension  . Hyperlipidemia  . Aortic Stenosis    History of Present Illness:    Todd Frank is a 62 y.o. male with a hx of HTN, dyslipidemia, bicuspid AV, diastolic dysfunction, dilated aorta, noted prior small PFO and 2 vessel CAD. He presented to [facility] last month after suffering a syncopal episode while jogging. HR was 170 prior to passing out per his watch. He hit his forehead when he fell and was admitted for observation. Echocardiogram was obtained and showed an increased gradient across his bicuspid aortic valve which had progressed from previous study, EF was preserved. Cardiac catheterization was also performed and showed severe 2 vessel CAD.  He underwent CABG x 2 utilizing LIMA to LAD, and SVG to distal PDA, AVR with a 23 mm [employer].    Post op echo showed LVEF 50-55%, no pericardial effusion, no regional wall motion abnormalities and stable AVR.    He is here today for followup and is doing well.  he denies any chest pain or pressure, SOB, DOE, PND, orthopnea, LE edema, dizziness, palpitations or syncope. His main complaint is daytime fatigue.  He is compliant with his meds and is tolerating meds with no SE.  He has started in cardiac rehab at [facility] which he is enjoying and walking daily.  He know about SBE prophylaxis.    Past Medical History:  Diagnosis Date  . Aortic stenosis    a. syncope/severe AS s/p bioprosthetic AVR 06/2017.  . Arthritis   . Bicuspid aortic valve   . Bradycardia 07/23/2015  . Broken clavicle    as child, can still dislocate at times  . CAD (coronary artery disease), native coronary artery    a. s/p 2V CABG at time of AVR 06/2017.  . Carotid bruit     carotid dopplers negative.  Bruit due to heart murmur from AS  . Congenital nevus   . Diastolic dysfunction   . Dilated aortic root ([place])    1mm by echo 02/2016  . GERD (gastroesophageal reflux disease)   . Headache   . HTN (hypertension)   . Hyperlipidemia    LDL goal < 70  . LVH (left ventricular hypertrophy)   . Melanoma of skin ([place])   . Obesity   . PFO (patent foramen ovale)    small by echo 2014, but not demonstrated during intra-op note from CABG/AVR 06/2017.    Past Surgical History:  Procedure Laterality Date  . AORTIC VALVE REPLACEMENT N/A 06/25/2017   Procedure: AORTIC VALVE REPLACEMENT (AVR) using Magna Ease Aortic Valve size 12mm;  Surgeon: Ivin Poot, MD;  Location: [place];  Service: Open Heart Surgery;  Laterality: N/A;  . arthritic cyst removal  2017   from Lumbar 4-5, dr pool  . CARDIAC CATHETERIZATION N/A 02/20/2016   Procedure: Right/Left Heart Cath and Coronary Angiography;  Surgeon: Belva Crome, MD;  Location: [place] [facility];  Service: Cardiovascular;  Laterality: N/A;  . CORONARY ARTERY BYPASS GRAFT N/A 06/25/2017   Procedure: CORONARY ARTERY BYPASS GRAFTING (CABG) x Two , using left internal mammary artery and right leg greater saphenous vein harvested endoscopically;  Surgeon: Ivin Poot,  MD;  Location: [facility];  Service: Open Heart Surgery;  Laterality: N/A;  . CORONARY/GRAFT ANGIOGRAPHY N/A 06/02/2017   Procedure: CORONARY/GRAFT ANGIOGRAPHY;  Surgeon: Nelva Bush, MD;  Location: [place] [facility];  Service: Cardiovascular;  Laterality: N/A;  . EYE SURGERY    . MELANOMA EXCISION  2011   left thigh  . RIGHT HEART CATH N/A 06/02/2017   Procedure: RIGHT HEART CATH;  Surgeon: Nelva Bush, MD;  Location: [place] [facility];  Service: Cardiovascular;  Laterality: N/A;  . TEE WITHOUT CARDIOVERSION N/A 06/25/2017   Procedure: TRANSESOPHAGEAL ECHOCARDIOGRAM (TEE);  Surgeon: Prescott Gum, Collier [place]ina, MD;  Location: [place];  Service: Open Heart  Surgery;  Laterality: N/A;  . TONSILLECTOMY      Current Medications: Current Meds  Medication Sig  . amoxicillin (AMOXIL) 500 MG capsule Take 500 mg by mouth once as needed.  Marland Kitchen aspirin EC 81 MG tablet Take 81 mg by mouth daily.  Marland Kitchen atorvastatin (LIPITOR) 80 MG tablet Take 1 tablet (80 mg total) by mouth daily.  . Coenzyme Q10 (COQ-10) 200 MG CAPS Take 200 mg by mouth daily.  . fluticasone (FLONASE) 50 MCG/ACT nasal spray Place 2 sprays into both nostrils daily. (Patient taking differently: Place 2 sprays into both nostrils daily as needed (for allergies.). )  . metoprolol tartrate (LOPRESSOR) 25 MG tablet Take 0.5 tablets (12.5 mg total) by mouth 2 (two) times daily.     Allergies:   Patient has no known allergies.   Social History   Socioeconomic History  . Marital status: Married    Spouse name: Not on file  . Number of children: Not on file  . Years of education: Not on file  . Highest education level: Not on file  Occupational History  . Occupation: Government social research officer - [employer]  . Financial resource strain: Not hard at all  . Food insecurity:    Worry: Never true    Inability: Never true  . Transportation needs:    Medical: No    Non-medical: No  Tobacco Use  . Smoking status: Former Smoker    Last attempt to quit: 1977    Years since quitting: 42.4  . Smokeless tobacco: Never Used  . Tobacco comment: quit in 78, teenager  Substance and Sexual Activity  . Alcohol use: Yes    Alcohol/week: 0.6 oz    Types: 1 Cans of beer per week    Comment: occasional  . Drug use: No  . Sexual activity: Yes  Lifestyle  . Physical activity:    Days per week: 7 days    Minutes per session: 80 min  . Stress: Not at all  Relationships  . Social connections:    Talks on phone: Three times a week    Gets together: More than three times a week    Attends religious service: Never    Active member of club or organization: No    Attends meetings of clubs or  organizations: Never    Relationship status: Married  Other Topics Concern  . Not on file  Social History Narrative  . Not on file     Family History: The patient's family history includes Arthritis in his father; Diabetes in his mother; Heart attack in his unknown relative; Heart disease in his paternal grandmother; Pancreatic cancer in his maternal aunt.  ROS:   Please see the history of present illness.    ROS  All other systems reviewed and negative.   EKGs/Labs/Other Studies  Reviewed:    The following studies were reviewed today: Hospital records, cath, echo  EKG:  EKG is not ordered today.    Recent Labs: 05/31/2017: TSH 1.226 06/23/2017: ALT 27 06/26/2017: Magnesium 2.3 07/15/2017: BUN 15; Creatinine, Ser 0.81; Hemoglobin 13.3; Platelets 298; Potassium 5.3; Sodium 139   Recent Lipid Panel    Component Value Date/Time   CHOL 118 02/10/2017 0836   CHOL 173 11/02/2015   TRIG 55 02/10/2017 0836   TRIG 69 11/02/2015   HDL 63 02/10/2017 0836   CHOLHDL 1.9 02/10/2017 0836   CHOLHDL 3.1 11/02/2015   VLDL 14 11/02/2015   LDLCALC 44 02/10/2017 0836   LDLCALC 104 (A) 11/02/2015    Physical Exam:    VS:  BP 138/82   Pulse 72   Ht 5' 9.5" (1.765 m)   Wt 179 lb (81.2 kg)   BMI 26.05 kg/m     Wt Readings from Last 3 Encounters:  08/28/17 179 lb (81.2 kg)  08/17/17 182 lb (82.6 kg)  07/27/17 185 lb 8 oz (84.1 kg)     GEN:  Well nourished, well developed in no acute distress HEENT: Normal NECK: No JVD; No carotid bruits LYMPHATICS: No lymphadenopathy CARDIAC: RRR, no murmurs, rubs, gallops RESPIRATORY:  Clear to auscultation without rales, wheezing or rhonchi  ABDOMEN: Soft, non-tender, non-distended MUSCULOSKELETAL:  No edema; No deformity  SKIN: Warm and dry NEUROLOGIC:  Alert and oriented x 3 PSYCHIATRIC:  Normal affect 1.    ASSESSMENT:    1. Severe aortic stenosis   2. Essential hypertension   3. Coronary artery disease involving native coronary  artery of native heart without angina pectoris   4. Dilated aortic root ([place])   5. Syncope, unspecified syncope type   6. Hypercholesteremia    PLAN:    In order of problems listed above:  1.  Severe AS -  23 mm [facility] Ease Valve.  Continue ASA. Post op echo 07/2017 showed stable bioprosthetic valve.  2.  HTN - BP is well controlled one exam.    3.  ASCAD - cath with severe 2 vessel ASCAD 06/2017 - s/p  CABG x 2 utilizing LIMA to LAD, and SVG to distal PDA.  He denies any anginal sx. He is active in cardiac rehab.  He will continue on ASA 81mg  daily, statin and lopressor 12.5mg  BID.    4.  Dilated aortic root - chest CT 06/2017 with ascending aorta measuring 4.3cm but only 3.9cm by echo.  Will repeat chest CT in 1 year. Continue statin.  Bp is controlled.   5.  Syncope related to #1 - no reoccurrence  6.  Hyperlipidemia with LDL goal < 70.  He will continue on atorvastatin 80mg  daily and I will check an FLP and ALT.    7.  Excessive daytime fatigue - unclear etiology.  Will get sleep study to rule out sleep apnea   Medication Adjustments/Labs and Tests Ordered: Current medicines are reviewed at length with the patient today.  Concerns regarding medicines are outlined above.  No orders of the defined types were placed in this encounter.  No orders of the defined types were placed in this encounter.   Signed, [name], MD  08/28/2017 10:02 AM    [place]

## 2017-08-28 NOTE — Patient Instructions (Signed)
Your physician recommends that you continue on your current medications as directed. Please refer to the Current Medication list given to you today.  Your physician recommends that you return for lab work today -lipid/liver  Your physician wants you to follow-up in: 6 months with [name].  You will receive a reminder letter in the mail two months in advance. If you don't receive a letter, please call our office to schedule the follow-up appointment.

## 2017-09-02 DIAGNOSIS — Z9889 Other specified postprocedural states: Secondary | ICD-10-CM | POA: Diagnosis not present

## 2017-09-02 NOTE — Progress Notes (Signed)
Daily Session Note  Patient Details  Name: KAVIAN PETERS MRN: [id number] Date of Birth: 02/24/1956 Referring Provider:     Cardiac Rehab from 07/27/2017 in [facility] Cardiac and Pulmonary Rehab  Referring Provider  [name] MD      Encounter Date: 09/02/2017  Check In: Session Check In - 09/02/17 0747      Check-In   Location  [facility]-Cardiac & Pulmonary Rehab    Staff Present  Alberteen Sam, MA, RCEP, CCRP, Exercise Physiologist;Susanne Bice, RN, BSN, CCRP;Joseph Lyons [employer]    Supervising physician immediately available to respond to emergencies  See telemetry face sheet for immediately available ER MD    Medication changes reported      No    Fall or balance concerns reported     No    Tobacco Cessation  No Change    Warm-up and Cool-down  Performed on first and last piece of equipment    Resistance Training Performed  Yes    VAD Patient?  No      Pain Assessment   Currently in Pain?  No/denies          Social History   Tobacco Use  Smoking Status Former Smoker  . Last attempt to quit: 1977  . Years since quitting: 42.4  Smokeless Tobacco Never Used  Tobacco Comment   quit in 78, teenager    Goals Met:  Independence with exercise equipment Exercise tolerated well No report of cardiac concerns or symptoms Strength training completed today  Goals Unmet:  Not Applicable  Comments: Pt able to follow exercise prescription today without complaint.  Will continue to monitor for progression.   Dr. Emily Filbert is Medical Director for [facility].

## 2017-09-04 ENCOUNTER — Encounter: Payer: Managed Care, Other (non HMO) | Admitting: *Deleted

## 2017-09-04 DIAGNOSIS — Z9889 Other specified postprocedural states: Secondary | ICD-10-CM | POA: Diagnosis not present

## 2017-09-04 NOTE — Progress Notes (Signed)
Daily Session Note  Patient Details  Name: Todd Frank MRN: [id number] Date of Birth: 1955/09/16 Referring Provider:     Cardiac Rehab from 07/27/2017 in [facility] Cardiac and Pulmonary Rehab  Referring Provider  [name] MD      Encounter Date: 09/04/2017  Check In: Session Check In - 09/04/17 0924      Check-In   Location  [facility]-Cardiac & Pulmonary Rehab    Staff Present  Renita Papa, RN BSN;Corderro Koloski Luan Pulling, MA, RCEP, CCRP, Exercise Physiologist;Amanda Oletta Darter, [place], ACSM CEP, Exercise Physiologist    Supervising physician immediately available to respond to emergencies  See telemetry face sheet for immediately available ER MD    Medication changes reported      No    Fall or balance concerns reported     No    Warm-up and Cool-down  Performed on first and last piece of equipment    Resistance Training Performed  Yes    VAD Patient?  No      Pain Assessment   Currently in Pain?  No/denies          Social History   Tobacco Use  Smoking Status Former Smoker  . Last attempt to quit: 1977  . Years since quitting: 42.4  Smokeless Tobacco Never Used  Tobacco Comment   quit in 78, teenager    Goals Met:  Independence with exercise equipment Exercise tolerated well Personal goals reviewed No report of cardiac concerns or symptoms Strength training completed today  Goals Unmet:  Not Applicable  Comments: Pt able to follow exercise prescription today without complaint.  Will continue to monitor for progression. See ITP for goal review   Dr. Emily Filbert is Medical Director for [place] and [facility].

## 2017-09-07 ENCOUNTER — Encounter: Payer: Managed Care, Other (non HMO) | Attending: Cardiology | Admitting: *Deleted

## 2017-09-07 DIAGNOSIS — I2510 Atherosclerotic heart disease of native coronary artery without angina pectoris: Secondary | ICD-10-CM | POA: Diagnosis not present

## 2017-09-07 DIAGNOSIS — Z79891 Long term (current) use of opiate analgesic: Secondary | ICD-10-CM | POA: Diagnosis not present

## 2017-09-07 DIAGNOSIS — Z9889 Other specified postprocedural states: Secondary | ICD-10-CM | POA: Diagnosis present

## 2017-09-07 DIAGNOSIS — Z87891 Personal history of nicotine dependence: Secondary | ICD-10-CM | POA: Diagnosis not present

## 2017-09-07 DIAGNOSIS — Z79899 Other long term (current) drug therapy: Secondary | ICD-10-CM | POA: Diagnosis not present

## 2017-09-07 DIAGNOSIS — Z7982 Long term (current) use of aspirin: Secondary | ICD-10-CM | POA: Insufficient documentation

## 2017-09-07 DIAGNOSIS — I10 Essential (primary) hypertension: Secondary | ICD-10-CM | POA: Diagnosis not present

## 2017-09-07 NOTE — Progress Notes (Signed)
Daily Session Note  Patient Details  Name: Todd Frank MRN: [id number] Date of Birth: 1955/05/03 Referring Provider:     Cardiac Rehab from 07/27/2017 in [facility] Cardiac and Pulmonary Rehab  Referring Provider  [name] MD      Encounter Date: 09/07/2017  Check In: Session Check In - 09/07/17 0849      Check-In   Location  [facility]-Cardiac & Pulmonary Rehab    Staff Present  Heath Lark, RN, BSN, CCRP;Mandi [place], BS, [place], [place], ACSM CEP, Exercise Physiologist    Supervising physician immediately available to respond to emergencies  See telemetry face sheet for immediately available ER MD    Medication changes reported      No    Fall or balance concerns reported     No    Tobacco Cessation  No Change    Warm-up and Cool-down  Performed on first and last piece of equipment    Resistance Training Performed  Yes    VAD Patient?  No      Pain Assessment   Currently in Pain?  No/denies    Multiple Pain Sites  No          Social History   Tobacco Use  Smoking Status Former Smoker  . Last attempt to quit: 1977  . Years since quitting: 42.4  Smokeless Tobacco Never Used  Tobacco Comment   quit in 78, teenager    Goals Met:  Independence with exercise equipment Exercise tolerated well No report of cardiac concerns or symptoms Strength training completed today  Goals Unmet:  Not Applicable  Comments: Pt able to follow exercise prescription today without complaint.  Will continue to monitor for progression.      Dr. Emily Filbert is Medical Director for [facility].

## 2017-09-08 ENCOUNTER — Ambulatory Visit: Payer: Managed Care, Other (non HMO) | Admitting: Family Medicine

## 2017-09-08 ENCOUNTER — Encounter: Payer: Self-pay | Admitting: Family Medicine

## 2017-09-08 VITALS — BP 106/62 | HR 59 | Temp 98.0°F | Resp 16 | Ht 69.5 in | Wt 178.8 lb

## 2017-09-08 DIAGNOSIS — J301 Allergic rhinitis due to pollen: Secondary | ICD-10-CM

## 2017-09-08 DIAGNOSIS — H6121 Impacted cerumen, right ear: Secondary | ICD-10-CM

## 2017-09-08 DIAGNOSIS — J321 Chronic frontal sinusitis: Secondary | ICD-10-CM

## 2017-09-08 MED ORDER — PREDNISONE 10 MG PO TABS: ORAL_TABLET | ORAL | 0 refills | Status: DC

## 2017-09-08 NOTE — Patient Instructions (Addendum)
Thank you for coming to the office today.  Referral back to [facility] ENT Dr Clyde Canterbury - discuss concern with possible chronic sinusitis - may need advanced imaging CT vs MRI of sinuses - consider other therapy, trial on prednisone - if unremarkable consider Neuro as next referral if needed  [facility] [id number]  [place], [place] [address] Ph: [phone]  Trial of Prednisone for inflammation of sinuses as we discussed this may help relieve pressure  Please schedule a Follow-up Appointment to: Return in about 5 months (around 02/08/2018) for Annual Physical (keep apt 02/12/18).  If you have any other questions or concerns, please feel free to call the office or send a message through [place]. You may also schedule an earlier appointment if necessary.  Additionally, you may be receiving a survey about your experience at our office within a few days to 1 week by e-mail or mail. We value your feedback.  Nobie Putnam, DO [place]

## 2017-09-08 NOTE — Progress Notes (Signed)
Subjective:    Patient ID: Todd Frank, male    DOB: July 28, 1955, 62 y.o.   MRN: [id number]  Todd Frank is a 62 y.o. male presenting on 09/08/2017 for Headache   HPI   FOLLOW-UP CHRONIC LIGHTHEADEDNESS / DIZZINESS VS HEADACHE - Last visit with me 02/2017, for annual physical initial visit for same problem chronic issue has been present for several years, he had been referred to [facility] ENT Dr Melony Overly back in Spring/Summer 2018, see prior notes for background information. - Interval update with he states that did not have significant help from ENT Dr Lucia Gaskins, they told him it was "not BPPV" and did not have any other diagnosis, he did not determine if any inner ear problem at that time, and he was told "may be Neurological" - Today patient reports same chronic problem. He experiences sensation most days, some pressure behind eyes with lightheadedness and dizziness, bilateral, occasionally he will get a brief feeling that he is unsteady and needs to reach out and grab something to hold on to this usually resolves within seconds. He may have repeat episodes in same day. He feels symptoms currently. Will also have some ear pressure and feels ears "clogged and pressure" sometimes he will "unclog" his ears with "popping or fluid within ear", usually Right ear worse than Left - Tried Flonase regularly with limited results, also taking OTC anti-histamine - He is asking for 2nd opinion from ENT or other option Admits sinus pressure and some fullness without obvious pain - Denies vertigo or room spinning, fever chills, nausea vomiting, vision changes  FOLLOW-UP Aortic Valve replacement and CABG - He is doing well overall, on cardiac rehab, significant improvement, exercise regularly - Will return for regularly scheduled follow-up, chart has been reviewed - Asymptomatic at this time Denies chest pain, dyspnea  Depression screen [facility] 2/9 09/08/2017 07/27/2017 02/06/2017  Decreased Interest  0 0 0  Down, Depressed, Hopeless 0 0 0  PHQ - 2 Score 0 0 0  Altered sleeping - 1 -  Tired, decreased energy - 0 -  Change in appetite - 0 -  Feeling bad or failure about yourself  - 0 -  Trouble concentrating - 0 -  Moving slowly or fidgety/restless - 0 -  Suicidal thoughts - 0 -  PHQ-9 Score - 1 -  Difficult doing work/chores - Not difficult at all -    Social History   Tobacco Use  . Smoking status: Former Smoker    Last attempt to quit: 1977    Years since quitting: 42.4  . Smokeless tobacco: Never Used  . Tobacco comment: quit in 78, teenager  Substance Use Topics  . Alcohol use: Yes    Alcohol/week: 0.6 oz    Types: 1 Cans of beer per week    Comment: occasional  . Drug use: No    Review of Systems Per HPI unless specifically indicated above     Objective:    BP 106/62   Pulse (!) 59   Temp 98 F (36.7 C) (Oral)   Resp 16   Ht 5' 9.5" (1.765 m)   Wt 178 lb 12.8 oz (81.1 kg)   BMI 26.03 kg/m   Wt Readings from Last 3 Encounters:  09/08/17 178 lb 12.8 oz (81.1 kg)  08/28/17 179 lb (81.2 kg)  08/17/17 182 lb (82.6 kg)    Physical Exam  Constitutional: He is oriented to person, place, and time. He appears well-developed and well-nourished. No distress.  Well-appearing, comfortable, cooperative  HENT:  Head: Normocephalic and atraumatic.  Mouth/Throat: Oropharynx is clear and moist.  Frontal sinuses mild tender. Nares patent without purulence or edema. Bilateral TMs with mild opaque appearance with some possible effusion without erythema or bulging, R TM required cerumen lavage first before full view available, see procedure note. Oropharynx clear without erythema, exudates, edema or asymmetry.  Eyes: Conjunctivae are normal. Right eye exhibits no discharge. Left eye exhibits no discharge.  Neck: Normal range of motion. Neck supple. No thyromegaly present.  Cardiovascular: Normal rate, regular rhythm, normal heart sounds and intact distal pulses.  No  murmur heard. Pulmonary/Chest: Effort normal and breath sounds normal. No respiratory distress. He has no wheezes. He has no rales.  Musculoskeletal: Normal range of motion. He exhibits no edema.  Lymphadenopathy:    He has no cervical adenopathy.  Neurological: He is alert and oriented to person, place, and time.  Skin: Skin is warm and dry. No rash noted. He is not diaphoretic. No erythema.  Psychiatric: He has a normal mood and affect. His behavior is normal.  Well groomed, good eye contact, normal speech and thoughts  Nursing note and vitals reviewed.  ________________________________________________________ PROCEDURE NOTE Date: 09/08/17 Right Ear Lavage / Cerumen Removal Discussed benefits and risks (including pain / discomforts, dizziness, minor abrasion of ear canal). Verbal consent given by patient. Medication:  carbamide peroxide ear drops, Ear Lavage Solution (warm water + hydrogen peroxide) Performed by Frederich Cha CMA and Dr Parks Ranger Several drops of carbamide peroxide placed in R ear, allowed to sit for few minutes. Ear lavage solution flushed into R ear  in attempt to dislodge and remove ear wax. Results were only partially successful, he did not tolerate due to dizziness, attempted curette as well was only partially successful, some ear wax retained.   Results for orders placed or performed in visit on 08/28/17  Hepatic function panel  Result Value Ref Range   Total Protein 6.7 6.0 - 8.5 g/dL   Albumin 4.6 3.6 - 4.8 g/dL   Bilirubin Total 0.9 0.0 - 1.2 mg/dL   Bilirubin, Direct 0.25 0.00 - 0.40 mg/dL   Alkaline Phosphatase 103 39 - 117 IU/L   AST 27 0 - 40 IU/L   ALT 26 0 - 44 IU/L  Lipid panel  Result Value Ref Range   Cholesterol, Total 104 100 - 199 mg/dL   Triglycerides 58 0 - 149 mg/dL   HDL 51 >39 mg/dL   VLDL Cholesterol Cal 12 5 - 40 mg/dL   LDL Calculated 41 0 - 99 mg/dL   Chol/HDL Ratio 2.0 0.0 - 5.0 ratio      Assessment & Plan:   Problem List  Items Addressed This Visit    Allergic rhinitis   Relevant Orders   Ambulatory referral to ENT   Chronic frontal sinusitis - Primary   Relevant Medications   predniSONE (DELTASONE) 10 MG tablet   Other Relevant Orders   Ambulatory referral to ENT      Consistent with persistent frontal sinusitis possibly chronic sinusitis now with concern for some possible inner ear secondary issue may have some effusion. Previous dx BPPV was ruled out by ENT, however seems to have persistent sinus issue, without any other focal neurological problem or deficit. Not consistent with bacterial infection or sinusitis at this time. Refractory to antihistamine and nasal steroid at this time  Plan: 1. Referral back to previous ENT [place] - Dr Clyde Canterbury - last seen >2+ years ago, last  time he was seen in [place] by Dr Lucia Gaskins now he wants 2nd opinion, will return to [facility] ENT, consider further work-up with likely advanced imaging of sinuses CT likely and discuss other treatment options - Ultimately if ruled out sinus issue completely, then we could consider Neurology, however clinically this seems less likely given his presentation - Defer antibiotics for now - Start empiric prednisone course taper 6 days 60 to 10mg  for effusion and sinusitis - Continue oral anti-histamine, and Flonase - Supportive care with nasal saline OTC, hydration, warm compresses and sinus effleurage as demonstrated Return criteria reviewed   Meds ordered this encounter  Medications  . predniSONE (DELTASONE) 10 MG tablet    Sig: Take 6 tabs with breakfast Day 1, 5 tabs Day 2, 4 tabs Day 3, 3 tabs Day 4, 2 tabs Day 5, 1 tab Day 6.    Dispense:  21 tablet    Refill:  0    Follow up plan: Return in about 5 months (around 02/08/2018) for Annual Physical (keep apt 02/12/18).  Future labs ordered for [facility] draw approx 02/2018 - to be released when notified by patient  Nobie Putnam, [facility] 09/08/2017, 8:58 AM

## 2017-09-09 ENCOUNTER — Other Ambulatory Visit: Payer: Self-pay | Admitting: Family Medicine

## 2017-09-09 DIAGNOSIS — J321 Chronic frontal sinusitis: Secondary | ICD-10-CM

## 2017-09-09 DIAGNOSIS — R7309 Other abnormal glucose: Secondary | ICD-10-CM

## 2017-09-09 DIAGNOSIS — E7800 Pure hypercholesterolemia, unspecified: Secondary | ICD-10-CM

## 2017-09-09 DIAGNOSIS — Z125 Encounter for screening for malignant neoplasm of prostate: Secondary | ICD-10-CM

## 2017-09-09 DIAGNOSIS — Z0000 Encounter for general adult medical examination without abnormal findings: Secondary | ICD-10-CM

## 2017-09-09 DIAGNOSIS — Z9889 Other specified postprocedural states: Secondary | ICD-10-CM

## 2017-09-09 DIAGNOSIS — I2510 Atherosclerotic heart disease of native coronary artery without angina pectoris: Secondary | ICD-10-CM

## 2017-09-09 DIAGNOSIS — I350 Nonrheumatic aortic (valve) stenosis: Secondary | ICD-10-CM

## 2017-09-09 DIAGNOSIS — I10 Essential (primary) hypertension: Secondary | ICD-10-CM

## 2017-09-09 DIAGNOSIS — Z951 Presence of aortocoronary bypass graft: Secondary | ICD-10-CM

## 2017-09-09 DIAGNOSIS — Z953 Presence of xenogenic heart valve: Secondary | ICD-10-CM

## 2017-09-09 DIAGNOSIS — E663 Overweight: Secondary | ICD-10-CM

## 2017-09-09 NOTE — Progress Notes (Signed)
Daily Session Note  Patient Details  Name: Todd Frank MRN: [id number] Date of Birth: 07/11/1955 Referring Provider:     Cardiac Rehab from 07/27/2017 in [facility] Cardiac and Pulmonary Rehab  Referring Provider  [name] MD      Encounter Date: 09/09/2017  Check In: Session Check In - 09/09/17 0742      Check-In   Location  [facility]-Cardiac & Pulmonary Rehab    Staff Present  Justin Mend RCP,RRT,BSRT;Heath Lark, RN, BSN, Florestine Avers, RN BSN    Supervising physician immediately available to respond to emergencies  See telemetry face sheet for immediately available ER MD    Medication changes reported      No    Fall or balance concerns reported     No    Tobacco Cessation  No Change    Warm-up and Cool-down  Performed on first and last piece of equipment    Resistance Training Performed  Yes    VAD Patient?  No      Pain Assessment   Currently in Pain?  No/denies          Social History   Tobacco Use  Smoking Status Former Smoker  . Last attempt to quit: 1977  . Years since quitting: 42.4  Smokeless Tobacco Never Used  Tobacco Comment   quit in 78, teenager    Goals Met:  Independence with exercise equipment Exercise tolerated well No report of cardiac concerns or symptoms Strength training completed today  Goals Unmet:  Not Applicable  Comments: Pt able to follow exercise prescription today without complaint.  Will continue to monitor for progression.   Dr. Emily Filbert is Medical Director for [place] and [facility].

## 2017-09-11 DIAGNOSIS — Z9889 Other specified postprocedural states: Secondary | ICD-10-CM | POA: Diagnosis not present

## 2017-09-11 NOTE — Progress Notes (Signed)
Daily Session Note  Patient Details  Name: Todd Frank MRN: [id number] Date of Birth: 22-Jun-1955 Referring Provider:     Cardiac Rehab from 07/27/2017 in [facility] Cardiac and Pulmonary Rehab  Referring Provider  [name] MD      Encounter Date: 09/11/2017  Check In: Session Check In - 09/11/17 0834      Check-In   Location  [facility]-Cardiac & Pulmonary Rehab    Staff Present  Constance Goltz, RN BSN;Mandi Ballard, BS, [place];Nada Maclachlan, BA, ACSM CEP, Exercise Physiologist    Supervising physician immediately available to respond to emergencies  See telemetry face sheet for immediately available ER MD    Medication changes reported      No    Fall or balance concerns reported     No    Warm-up and Cool-down  Performed on first and last piece of equipment    Resistance Training Performed  Yes    VAD Patient?  No      Pain Assessment   Currently in Pain?  No/denies    Multiple Pain Sites  No          Social History   Tobacco Use  Smoking Status Former Smoker  . Last attempt to quit: 1977  . Years since quitting: 42.4  Smokeless Tobacco Never Used  Tobacco Comment   quit in 78, teenager    Goals Met:  Independence with exercise equipment Exercise tolerated well No report of cardiac concerns or symptoms Strength training completed today  Goals Unmet:  Not Applicable  Comments: Pt able to follow exercise prescription today without complaint.  Will continue to monitor for progression.    Dr. Emily Filbert is Medical Director for [place] and [facility].

## 2017-09-14 ENCOUNTER — Encounter: Payer: Managed Care, Other (non HMO) | Admitting: *Deleted

## 2017-09-14 DIAGNOSIS — Z9889 Other specified postprocedural states: Secondary | ICD-10-CM

## 2017-09-14 NOTE — Progress Notes (Signed)
Daily Session Note  Patient Details  Name: Todd Frank MRN: [id number] Date of Birth: Jan 03, 1956 Referring Provider:     Cardiac Rehab from 07/27/2017 in [facility] Cardiac and Pulmonary Rehab  Referring Provider  [name] MD      Encounter Date: 09/14/2017  Check In: Session Check In - 09/14/17 0758      Check-In   Location  [facility]-Cardiac & Pulmonary Rehab    Staff Present  Heath Lark, RN, BSN, CCRP;Jessica Luan Pulling, MA, RCEP, CCRP, Exercise Physiologist;Sarh Kirschenbaum Amedeo Plenty, BS, ACSM CEP, Exercise Physiologist    Supervising physician immediately available to respond to emergencies  See telemetry face sheet for immediately available ER MD    Medication changes reported      No    Fall or balance concerns reported     No    Tobacco Cessation  No Change    Warm-up and Cool-down  Performed on first and last piece of equipment    Resistance Training Performed  Yes    VAD Patient?  No      Pain Assessment   Currently in Pain?  No/denies    Multiple Pain Sites  No          Social History   Tobacco Use  Smoking Status Former Smoker  . Last attempt to quit: 1977  . Years since quitting: 42.4  Smokeless Tobacco Never Used  Tobacco Comment   quit in 78, teenager    Goals Met:  Independence with exercise equipment Exercise tolerated well No report of cardiac concerns or symptoms Strength training completed today  Goals Unmet:  Not Applicable  Comments: Pt able to follow exercise prescription today without complaint.  Will continue to monitor for progression.      Dr. Emily Filbert is Medical Director for [place] and [facility].

## 2017-09-15 ENCOUNTER — Encounter: Payer: Self-pay | Admitting: Family Medicine

## 2017-09-16 DIAGNOSIS — Z9889 Other specified postprocedural states: Secondary | ICD-10-CM | POA: Diagnosis not present

## 2017-09-16 NOTE — Progress Notes (Signed)
Daily Session Note  Patient Details  Name: Todd Frank MRN: [id number] Date of Birth: 22-Oct-1955 Referring Provider:     Cardiac Rehab from 07/27/2017 in [facility] Cardiac and Pulmonary Rehab  Referring Provider  [name] MD      Encounter Date: 09/16/2017  Check In: Session Check In - 09/16/17 0737      Check-In   Location  [facility]-Cardiac & Pulmonary Rehab    Staff Present  Justin Mend RCP,RRT,BSRT;Heath Lark, RN, BSN, CCRP;Jessica Luan Pulling, MA, RCEP, CCRP, Exercise Physiologist    Supervising physician immediately available to respond to emergencies  See telemetry face sheet for immediately available ER MD    Medication changes reported      No    Fall or balance concerns reported     No    Tobacco Cessation  No Change    Warm-up and Cool-down  Performed on first and last piece of equipment    Resistance Training Performed  Yes    VAD Patient?  No      Pain Assessment   Currently in Pain?  No/denies          Social History   Tobacco Use  Smoking Status Former Smoker  . Last attempt to quit: 1977  . Years since quitting: 42.4  Smokeless Tobacco Never Used  Tobacco Comment   quit in 78, teenager    Goals Met:  Independence with exercise equipment Exercise tolerated well No report of cardiac concerns or symptoms Strength training completed today  Goals Unmet:  Not Applicable  Comments: Pt able to follow exercise prescription today without complaint.  Will continue to monitor for progression.   Dr. Emily Filbert is Medical Director for [place] and [facility].

## 2017-09-18 ENCOUNTER — Encounter: Payer: Managed Care, Other (non HMO) | Admitting: *Deleted

## 2017-09-18 VITALS — Ht 70.5 in | Wt 179.0 lb

## 2017-09-18 DIAGNOSIS — Z9889 Other specified postprocedural states: Secondary | ICD-10-CM | POA: Diagnosis not present

## 2017-09-18 NOTE — Progress Notes (Signed)
Daily Session Note  Patient Details  Name: Todd Frank MRN: [id number] Date of Birth: 26-Oct-1955 Referring Provider:     Cardiac Rehab from 07/27/2017 in [facility] Cardiac and Pulmonary Rehab  Referring Provider  [name] MD      Encounter Date: 09/18/2017  Check In: Session Check In - 09/18/17 0829      Check-In   Location  [facility]-Cardiac & Pulmonary Rehab    Staff Present  Renita Papa, RN BSN;Gevorg Brum Luan Pulling, MA, RCEP, CCRP, Exercise Physiologist;Amanda Oletta Darter, [place], ACSM CEP, Exercise Physiologist    Supervising physician immediately available to respond to emergencies  See telemetry face sheet for immediately available ER MD    Medication changes reported      No    Fall or balance concerns reported     No    Warm-up and Cool-down  Performed on first and last piece of equipment    Resistance Training Performed  Yes    VAD Patient?  No      Pain Assessment   Currently in Pain?  No/denies          Social History   Tobacco Use  Smoking Status Former Smoker  . Last attempt to quit: 1977  . Years since quitting: 42.4  Smokeless Tobacco Never Used  Tobacco Comment   quit in 78, teenager    Goals Met:  Independence with exercise equipment Exercise tolerated well No report of cardiac concerns or symptoms Strength training completed today  Goals Unmet:  Not Applicable  Comments: Pt able to follow exercise prescription today without complaint.  Will continue to monitor for progression. [place] Name 07/27/17 1406 09/18/17 0830       6 Minute Walk   Phase  Initial  Discharge    Distance  1550 feet  2100 feet    Distance % Change  -  35.5 %    Distance Feet Change  -  550 ft    Walk Time  6 minutes  6 minutes    # of Rest Breaks  0  0    MPH  2.94  3.98    METS  3.93  5.05    RPE  10  11    VO2 Peak  13.74  17.66    Symptoms  No  No    Resting HR  60 bpm  81 bpm    Resting BP  134/74  122/66    Resting Oxygen Saturation   99 %  -    Exercise Oxygen Saturation  during 6 min walk  98 %  -    Max Ex. HR  94 bpm  115 bpm    Max Ex. BP  142/78  128/72    2 Minute Post BP  126/68  -         Dr. Emily Filbert is Medical Director for [place] and [facility].

## 2017-09-18 NOTE — Patient Instructions (Signed)
Discharge Patient Instructions  Patient Details  Name: Todd Frank MRN: [id number] Date of Birth: 1955/06/09 Referring Provider:  [name], MD   Number of Visits: 36/36  Reason for Discharge:  Patient reached a stable level of exercise. Patient independent in their exercise. Patient has met program and personal goals.  Smoking History:  Social History   Tobacco Use  Smoking Status Former Smoker  . Last attempt to quit: 1977  . Years since quitting: 42.4  Smokeless Tobacco Never Used  Tobacco Comment   quit in 78, teenager    Diagnosis:  S/P aortic valve repair  Initial Exercise Prescription: Initial Exercise Prescription - 07/27/17 1400      Date of Initial Exercise RX and Referring Provider   Date  07/27/17    Referring Provider  [name] MD      Treadmill   MPH  2.9    Grade  1    Minutes  15    METs  3.62      REL-XR   Level  2    Speed  50    Minutes  15    METs  3.6      T5 Nustep   Level  3    SPM  80    Minutes  15    METs  3.6      Prescription Details   Frequency (times per week)  3    Duration  Progress to 45 minutes of aerobic exercise without signs/symptoms of physical distress      Intensity   THRR 40-80% of Max Heartrate  99-138    Ratings of Perceived Exertion  11-13    Perceived Dyspnea  0-4      Progression   Progression  Continue to progress workloads to maintain intensity without signs/symptoms of physical distress.      Resistance Training   Training Prescription  Yes    Weight  4 lbs    Reps  10-15       Discharge Exercise Prescription (Final Exercise Prescription Changes): Exercise Prescription Changes - 09/16/17 1400      Response to Exercise   Blood Pressure (Admit)  108/64    Blood Pressure (Exercise)  154/70    Blood Pressure (Exit)  118/60    Heart Rate (Admit)  89 bpm    Heart Rate (Exercise)  133 bpm    Heart Rate (Exit)  66 bpm    Rating of Perceived Exertion (Exercise)  13    Symptoms   none    Duration  Continue with 45 min of aerobic exercise without signs/symptoms of physical distress.    Intensity  THRR unchanged      Progression   Progression  Continue to progress workloads to maintain intensity without signs/symptoms of physical distress.    Average METs  10.33      Resistance Training   Training Prescription  Yes    Weight  8 lbs    Reps  10-15      Interval Training   Interval Training  Yes    Equipment  Treadmill;NuStep;REL-XR    Comments  1 min on 1 min off      Treadmill   MPH  3.4    Grade  8    Minutes  15    METs  7.4      REL-XR   Level  10    Minutes  15    METs  12.1  T5 Nustep   Level  4    Minutes  15    METs  11.5      Home Exercise Plan   Plans to continue exercise at  [facility] (comment) treadmill and [employer]    Frequency  Add 2 additional days to program exercise sessions.    Initial Home Exercises Provided  08/07/17       Functional Capacity: 6 Minute Walk    Row Name 07/27/17 1406 09/18/17 0830       6 Minute Walk   Phase  Initial  Discharge    Distance  1550 feet  2100 feet    Distance % Change  -  35.5 %    Distance Feet Change  -  550 ft    Walk Time  6 minutes  6 minutes    # of Rest Breaks  0  0    MPH  2.94  3.98    METS  3.93  5.05    RPE  10  11    VO2 Peak  13.74  17.66    Symptoms  No  No    Resting HR  60 bpm  81 bpm    Resting BP  134/74  122/66    Resting Oxygen Saturation   99 %  -    Exercise Oxygen Saturation  during 6 min walk  98 %  -    Max Ex. HR  94 bpm  115 bpm    Max Ex. BP  142/78  128/72    2 Minute Post BP  126/68  -       Quality of Life: Quality of Life - 09/18/17 0831      Quality of Life Scores   Health/Function Pre  26.03 %    Health/Function Post  25.18 %    Health/Function % Change  -3.27 %    Socioeconomic Pre  28.93 %    Socioeconomic Post  27.14 %    Socioeconomic % Change   -6.19 %    Psych/Spiritual Pre  27.58 %    Psych/Spiritual Post  24.07 %     Psych/Spiritual % Change  -12.73 %    Family Pre  25.5 %    Family Post  27 %    Family % Change  5.88 %    GLOBAL Pre  26.89 %    GLOBAL Post  25.59 %    GLOBAL % Change  -4.83 %       Personal Goals: Goals established at orientation with interventions provided to work toward goal. Personal Goals and Risk Factors at Admission - 07/27/17 1237      Core Components/Risk Factors/Patient Goals on Admission    Weight Management  Yes;Weight Loss    Intervention  Weight Management: Develop a combined nutrition and exercise program designed to reach desired caloric intake, while maintaining appropriate intake of nutrient and fiber, sodium and fats, and appropriate energy expenditure required for the weight goal.;Weight Management: Provide education and appropriate resources to help participant work on and attain dietary goals.;Weight Management/Obesity: Establish reasonable short term and long term weight goals.    Admit Weight  185 lb (83.9 kg)    Goal Weight: Short Term  180 lb (81.6 kg)    Goal Weight: Long Term  160 lb (72.6 kg)    Expected Outcomes  Short Term: Continue to assess and modify interventions until short term weight is achieved;Long Term: Adherence to nutrition and physical activity/exercise program  aimed toward attainment of established weight goal;Weight Loss: Understanding of general recommendations for a balanced deficit meal plan, which promotes 1-2 lb weight loss per week and includes a negative energy balance of [phone] kcal/d;Understanding recommendations for meals to include 15-35% energy as protein, 25-35% energy from fat, 35-60% energy from carbohydrates, less than 268m of dietary cholesterol, 20-35 gm of total fiber daily;Understanding of distribution of calorie intake throughout the day with the consumption of 4-5 meals/snacks    Hypertension  Yes    Intervention  Provide education on lifestyle modifcations including regular physical activity/exercise, weight management,  moderate sodium restriction and increased consumption of fresh fruit, vegetables, and low fat dairy, alcohol moderation, and smoking cessation.;Monitor prescription use compliance.    Expected Outcomes  Short Term: Continued assessment and intervention until BP is < 140/915mHG in hypertensive participants. < 130/8013mG in hypertensive participants with diabetes, heart failure or chronic kidney disease.;Long Term: Maintenance of blood pressure at goal levels.    Lipids  Yes    Intervention  Provide education and support for participant on nutrition & aerobic/resistive exercise along with prescribed medications to achieve LDL <103m16mDL >40mg58m Expected Outcomes  Short Term: Participant states understanding of desired cholesterol values and is compliant with medications prescribed. Participant is following exercise prescription and nutrition guidelines.;Long Term: Cholesterol controlled with medications as prescribed, with individualized exercise RX and with personalized nutrition plan. Value goals: LDL < 103mg,25m > 40 mg.        Personal Goals Discharge: Goals and Risk Factor Review - 09/04/17 0754      Core Components/Risk Factors/Patient Goals Review   Personal Goals Review  Weight Management/Obesity;Lipids;Hypertension    Review  Todd Frank another pound this week.  He is long term goal is 160lbs, at home he is at 175lbs.  He wants to continue lose.  He continues to check blood pressures.     Expected Outcomes  Short: Continue to lose weight at a pound a week.  Long: Continue get down to 160lbs.        Exercise Goals and Review: Exercise Goals    Row Name 07/27/17 1423             Exercise Goals   Increase Physical Activity  Yes       Intervention  Provide advice, education, support and counseling about physical activity/exercise needs.;Develop an individualized exercise prescription for aerobic and resistive training based on initial evaluation findings, risk stratification,  comorbidities and participant's personal goals.       Expected Outcomes  Short Term: Attend rehab on a regular basis to increase amount of physical activity.;Long Term: Add in home exercise to make exercise part of routine and to increase amount of physical activity.;Long Term: Exercising regularly at least 3-5 days a week.       Increase Strength and Stamina  Yes       Intervention  Provide advice, education, support and counseling about physical activity/exercise needs.;Develop an individualized exercise prescription for aerobic and resistive training based on initial evaluation findings, risk stratification, comorbidities and participant's personal goals.       Expected Outcomes  Short Term: Increase workloads from initial exercise prescription for resistance, speed, and METs.;Short Term: Perform resistance training exercises routinely during rehab and add in resistance training at home;Long Term: Improve cardiorespiratory fitness, muscular endurance and strength as measured by increased METs and functional capacity (6MWT)       Able to understand and use rate of  perceived exertion (RPE) scale  Yes       Intervention  Provide education and explanation on how to use RPE scale       Expected Outcomes  Short Term: Able to use RPE daily in rehab to express subjective intensity level;Long Term:  Able to use RPE to guide intensity level when exercising independently       Knowledge and understanding of Target Heart Rate Range (THRR)  Yes       Intervention  Provide education and explanation of THRR including how the numbers were predicted and where they are located for reference       Expected Outcomes  Short Term: Able to state/look up THRR;Short Term: Able to use daily as guideline for intensity in rehab;Long Term: Able to use THRR to govern intensity when exercising independently       Able to check pulse independently  Yes       Intervention  Provide education and demonstration on how to check pulse in  carotid and radial arteries.;Review the importance of being able to check your own pulse for safety during independent exercise       Expected Outcomes  Short Term: Able to explain why pulse checking is important during independent exercise;Long Term: Able to check pulse independently and accurately       Understanding of Exercise Prescription  Yes       Intervention  Provide education, explanation, and written materials on patient's individual exercise prescription       Expected Outcomes  Short Term: Able to explain program exercise prescription;Long Term: Able to explain home exercise prescription to exercise independently          Nutrition & Weight - Outcomes: Pre Biometrics - 07/27/17 1423      Pre Biometrics   Height  5' 10.5" (1.791 m)    Weight  185 lb 8 oz (84.1 kg)    Waist Circumference  33 inches    Hip Circumference  38.5 inches    Waist to Hip Ratio  0.86 %    BMI (Calculated)  26.23    Single Leg Stand  30 seconds      Post Biometrics - 09/18/17 0833       Post  Biometrics   Height  5' 10.5" (1.791 m)    Weight  179 lb (81.2 kg)    Waist Circumference  33 inches    Hip Circumference  38 inches    Waist to Hip Ratio  0.87 %    BMI (Calculated)  25.31    Single Leg Stand  30 seconds       Nutrition: Nutrition Therapy & Goals - 08/17/17 1001      Nutrition Therapy   Diet  TLC    Drug/Food Interactions  Statins/Certain Fruits    Protein (specify units)  10oz    Fiber  30 grams    Whole Grain Foods  3 servings    Saturated Fats  15 max. grams    Fruits and Vegetables  6 servings/day 8 ideal    Sodium  2000 grams      Personal Nutrition Goals   Nutrition Goal  Transition to cooking with fats that are liquid at room temperature such as olive oil. Choose spread-able butters rather than traditional stick butter more often    Personal Goal #2  Work to increase variety of meals. Structure meals according to the plate method as discussed, and use this as a tool to  cover  nutritional bases while still allowing yourself variety as you transition to a more whole-food based style of eating    Personal Goal #3  For the occasional snack, choose snacks that are high in protein such as [place] yogurt, nuts, lower-fat cheese, peanut butter crackers, and cottage cheese    Comments  He has made several positive changes to his diet since hospitalization in March 2019 which has also resulted in weight loss which he desires. He is on track to achieving his goals.      Intervention Plan   Intervention  Nutrition handout(s) given to patient.;Prescribe, educate and counsel regarding individualized specific dietary modifications aiming towards targeted core components such as weight, hypertension, lipid management, diabetes, heart failure and other comorbidities. Gave following a low sodium diet handout    Expected Outcomes  Short Term Goal: Understand basic principles of dietary content, such as calories, fat, sodium, cholesterol and nutrients.;Short Term Goal: A plan has been developed with personal nutrition goals set during dietitian appointment.;Long Term Goal: Adherence to prescribed nutrition plan.       Nutrition Discharge: Nutrition Assessments - 09/18/17 0831      MEDFICTS Scores   Pre Score  18    Post Score  9    Score Difference  -9       Education Questionnaire Score: Knowledge Questionnaire Score - 09/18/17 0831      Knowledge Questionnaire Score   Pre Score  28/28    Post Score  27/28       Goals reviewed with patient; copy given to patient.

## 2017-09-21 ENCOUNTER — Encounter: Payer: Managed Care, Other (non HMO) | Admitting: *Deleted

## 2017-09-21 DIAGNOSIS — Z9889 Other specified postprocedural states: Secondary | ICD-10-CM | POA: Diagnosis not present

## 2017-09-21 NOTE — Progress Notes (Signed)
Cardiac Individual Treatment Plan  Patient Details  Name: Todd Frank MRN: [id number] Date of Birth: [birth year] Referring Provider:     Cardiac Rehab from 07/27/2017 in [facility] Cardiac and Pulmonary Rehab  Referring Provider  [name] MD      Initial Encounter Date:    Cardiac Rehab from 07/27/2017 in [facility] Cardiac and Pulmonary Rehab  Date  07/27/17  Referring Provider  [name] MD      Visit Diagnosis: S/P aortic valve repair  Patient's Home Medications on Admission:  Current Outpatient Medications:  .  aspirin EC 81 MG tablet, Take 81 mg by mouth daily., Disp: , Rfl:  .  atorvastatin (LIPITOR) 80 MG tablet, Take 1 tablet (80 mg total) by mouth daily., Disp: 90 tablet, Rfl: 3 .  Coenzyme Q10 (COQ-10) 200 MG CAPS, Take 200 mg by mouth daily., Disp: , Rfl:  .  fluticasone (FLONASE) 50 MCG/ACT nasal spray, Place 2 sprays into both nostrils daily. (Patient taking differently: Place 2 sprays into both nostrils daily as needed (for allergies.). ), Disp: 16 g, Rfl: 12 .  metoprolol tartrate (LOPRESSOR) 25 MG tablet, Take 0.5 tablets (12.5 mg total) by mouth 2 (two) times daily., Disp: 60 tablet, Rfl: 6 .  predniSONE (DELTASONE) 10 MG tablet, Take 6 tabs with breakfast Day 1, 5 tabs Day 2, 4 tabs Day 3, 3 tabs Day 4, 2 tabs Day 5, 1 tab Day 6., Disp: 21 tablet, Rfl: 0  Past Medical History: Past Medical History:  Diagnosis Date  . Aortic stenosis    a. syncope/severe AS s/p bioprosthetic AVR 06/2017.  . Arthritis   . Bicuspid aortic valve   . Bradycardia 07/23/2015  . Broken clavicle    as child, can still dislocate at times  . CAD (coronary artery disease), native coronary artery    a. s/p 2V CABG at time of AVR 06/2017.  . Carotid bruit    carotid dopplers negative.  Bruit due to heart murmur from AS  . Congenital nevus   . Diastolic dysfunction   . Dilated aortic root ([place])    77m by echo 02/2016  . GERD (gastroesophageal reflux disease)   . Headache   . HTN  (hypertension)   . Hyperlipidemia    LDL goal < 70  . LVH (left ventricular hypertrophy)   . [place]   . Obesity   . PFO (patent foramen ovale)    small by echo 2014, but not demonstrated during intra-op note from CABG/AVR 06/2017.    Tobacco Use: Social History   Tobacco Use  Smoking Status Former Smoker  . Last attempt to quit: 1977  . Years since quitting: 42.4  Smokeless Tobacco Never Used  Tobacco Comment   quit in 78, teenager    Labs: Recent Review Flowsheet Data    Labs for ITP Cardiac and Pulmonary Rehab Latest Ref Rng & Units 06/25/2017 06/25/2017 06/25/2017 06/26/2017 08/28/2017   Cholestrol 100 - 199 mg/dL - - - - 104   LDLCALC 0 - 99 mg/dL - - - - 41   HDL >39 mg/dL - - - - 51   Trlycerides 0 - 149 mg/dL - - - - 58   Hemoglobin A1c 4.8 - 5.6 % - - - - -   PHART 7.350 - 7.450 7.349(L) - 7.311(L) - -   PCO2ART 32.0 - 48.0 mmHg 39.2 - 47.0 - -   HCO3 20.0 - 28.0 mmol/L 21.6 - 23.8 - -   TCO2 22 -  32 mmol/L _0 -   ACIDBASEDEF 0.0 - 2.0 mmol/L 4.0(H) - 3.0(H) - -   O2SAT % 98.0 - 98.0 - -       Exercise Target Goals:    Exercise Program Goal: Individual exercise prescription set using results from initial 6 min walk test and THRR while considering  patient's activity barriers and safety.   Exercise Prescription Goal: Initial exercise prescription builds to 30-45 minutes a day of aerobic activity, 2-3 days per week.  Home exercise guidelines will be given to patient during program as part of exercise prescription that the participant will acknowledge.  Activity Barriers & Risk Stratification: Activity Barriers & Cardiac Risk Stratification - 07/27/17 1247      Activity Barriers & Cardiac Risk Stratification   Activity Barriers  Joint Problems R shoulder, history of broken collar bone    Cardiac Risk Stratification  Moderate       6 Minute Walk: 6 Minute Walk    Row Name 07/27/17 1406 09/18/17 0830       6 Minute Walk   Phase   Initial  Discharge    Distance  1550 feet  2100 feet    Distance % Change  -  35.5 %    Distance Feet Change  -  550 ft    Walk Time  6 minutes  6 minutes    # of Rest Breaks  0  0    MPH  2.94  3.98    METS  3.93  5.05    RPE  10  11    VO2 Peak  13.74  17.66    Symptoms  No  No    Resting HR  60 bpm  81 bpm    Resting BP  134/74  122/66    Resting Oxygen Saturation   99 %  -    Exercise Oxygen Saturation  during 6 min walk  98 %  -    Max Ex. HR  94 bpm  115 bpm    Max Ex. BP  142/78  128/72    2 Minute Post BP  126/68  -       Oxygen Initial Assessment:   Oxygen Re-Evaluation:   Oxygen Discharge (Final Oxygen Re-Evaluation):   Initial Exercise Prescription: Initial Exercise Prescription - 07/27/17 1400      Date of Initial Exercise RX and Referring Provider   Date  07/27/17    Referring Provider  [name] MD      Treadmill   MPH  2.9    Grade  1    Minutes  15    METs  3.62      REL-XR   Level  2    Speed  50    Minutes  15    METs  3.6      T5 Nustep   Level  3    SPM  80    Minutes  15    METs  3.6      Prescription Details   Frequency (times per week)  3    Duration  Progress to 45 minutes of aerobic exercise without signs/symptoms of physical distress      Intensity   THRR 40-80% of Max Heartrate  99-138    Ratings of Perceived Exertion  11-13    Perceived Dyspnea  0-4      Progression   Progression  Continue to progress workloads to maintain intensity without signs/symptoms of physical distress.  Resistance Training   Training Prescription  Yes    Weight  4 lbs    Reps  10-15       Perform Capillary Blood Glucose checks as needed.  Exercise Prescription Changes: Exercise Prescription Changes    Row Name 07/27/17 1400 08/05/17 1500 08/07/17 0900 08/17/17 1500 09/02/17 1400     Response to Exercise   Blood Pressure (Admit)  134/74  134/62  -  110/60  110/64   Blood Pressure (Exercise)  142/78  146/80  -  142/80  136/70    Blood Pressure (Exit)  126/68  124/70  -  114/68  106/78   Heart Rate (Admit)  60 bpm  88 bpm  -  88 bpm  84 bpm   Heart Rate (Exercise)  94 bpm  123 bpm  -  107 bpm  136 bpm   Heart Rate (Exit)  61 bpm  71 bpm  -  78 bpm  93 bpm   Oxygen Saturation (Admit)  99 %  -  -  -  -   Oxygen Saturation (Exercise)  98 %  -  -  -  -   Rating of Perceived Exertion (Exercise)  10  12  -  12  13   Symptoms  none  none  -  none  none   Comments  walk test results  -  -  -  -   Duration  -  Continue with 45 min of aerobic exercise without signs/symptoms of physical distress.  -  Continue with 45 min of aerobic exercise without signs/symptoms of physical distress.  Continue with 45 min of aerobic exercise without signs/symptoms of physical distress.   Intensity  -  THRR unchanged  -  THRR unchanged  THRR unchanged     Progression   Progression  -  Continue to progress workloads to maintain intensity without signs/symptoms of physical distress.  -  Continue to progress workloads to maintain intensity without signs/symptoms of physical distress.  Continue to progress workloads to maintain intensity without signs/symptoms of physical distress.   Average METs  -  3.64  -  4.63  5.86     Resistance Training   Training Prescription  -  Yes  -  Yes  Yes   Weight  -  5 lbs  -  5 lbs  5 lbs   Reps  -  10-15  -  10-15  10-15     Interval Training   Interval Training  -  No  -  No  No     Treadmill   MPH  -  2.9  -  3.2  3.2   Grade  -  1  -  1  1   Minutes  -  15  -  15  15   METs  -  3.62  -  3.89  3.89     REL-XR   Level  -  5  -  8  10   Minutes  -  15  -  15  15   METs  -  4.1  -  5.7  8.5     T5 Nustep   Level  -  3  -  4  4   Minutes  -  15  -  15  15   METs  -  3.2  -  4.3  5.2     Home Exercise Plan   Plans to continue exercise at  -  -  [employer] (comment) treadmill and [employer] (comment) treadmill and [employer] (comment) treadmill and [employer]  -  -  Add 2 additional days to program exercise sessions.  Add 2 additional days to program exercise sessions.  Add 2 additional days to program exercise sessions.   Initial Home Exercises Provided  -  -  08/07/17  08/07/17  08/07/17   Row Name 09/16/17 1400             Response to Exercise   Blood Pressure (Admit)  108/64       Blood Pressure (Exercise)  154/70       Blood Pressure (Exit)  118/60       Heart Rate (Admit)  89 bpm       Heart Rate (Exercise)  133 bpm       Heart Rate (Exit)  66 bpm       Rating of Perceived Exertion (Exercise)  13       Symptoms  none       Duration  Continue with 45 min of aerobic exercise without signs/symptoms of physical distress.       Intensity  THRR unchanged         Progression   Progression  Continue to progress workloads to maintain intensity without signs/symptoms of physical distress.       Average METs  10.33         Resistance Training   Training Prescription  Yes       Weight  8 lbs       Reps  10-15         Interval Training   Interval Training  Yes       Equipment  Treadmill;NuStep;REL-XR       Comments  1 min on 1 min off         Treadmill   MPH  3.4       Grade  8       Minutes  15       METs  7.4         REL-XR   Level  10       Minutes  15       METs  12.1         T5 Nustep   Level  4       Minutes  15       METs  11.5         Home Exercise Plan   Plans to continue exercise at  [facility] (comment) treadmill and [employer]       Frequency  Add 2 additional days to program exercise sessions.       Initial Home Exercises Provided  08/07/17          Exercise Comments: Exercise Comments    Row Name 07/29/17 9292 08/21/17 0825 09/21/17 0755       Exercise Comments  First full day of exercise!  Patient was oriented to gym and equipment including functions, settings, policies, and procedures.  Patient's individual exercise prescription and treatment plan were reviewed.  All starting workloads  were established based on the results of the 6 minute walk test done at initial orientation visit.  The plan for exercise progression was also introduced and progression will be customized based on patient's performance and goals.  Started intervals today  Todd Frank graduated today from  rehab with 36 sessions completed.  Details of the  patient's exercise prescription and what He needs to do in order to continue the prescription and progress were discussed with patient.  Patient was given a copy of prescription and goals.  Patient verbalized understanding. Mikeplans to continue to exercise by going to [facility].        Exercise Goals and Review: Exercise Goals    Row Name 07/27/17 1423             Exercise Goals   Increase Physical Activity  Yes       Intervention  Provide advice, education, support and counseling about physical activity/exercise needs.;Develop an individualized exercise prescription for aerobic and resistive training based on initial evaluation findings, risk stratification, comorbidities and participant's personal goals.       Expected Outcomes  Short Term: Attend rehab on a regular basis to increase amount of physical activity.;Long Term: Add in home exercise to make exercise part of routine and to increase amount of physical activity.;Long Term: Exercising regularly at least 3-5 days a week.       Increase Strength and Stamina  Yes       Intervention  Provide advice, education, support and counseling about physical activity/exercise needs.;Develop an individualized exercise prescription for aerobic and resistive training based on initial evaluation findings, risk stratification, comorbidities and participant's personal goals.       Expected Outcomes  Short Term: Increase workloads from initial exercise prescription for resistance, speed, and METs.;Short Term: Perform resistance training exercises routinely during rehab and add in resistance training at home;Long Term: Improve  cardiorespiratory fitness, muscular endurance and strength as measured by increased METs and functional capacity (6MWT)       Able to understand and use rate of perceived exertion (RPE) scale  Yes       Intervention  Provide education and explanation on how to use RPE scale       Expected Outcomes  Short Term: Able to use RPE daily in rehab to express subjective intensity level;Long Term:  Able to use RPE to guide intensity level when exercising independently       Knowledge and understanding of Target Heart Rate Range (THRR)  Yes       Intervention  Provide education and explanation of THRR including how the numbers were predicted and where they are located for reference       Expected Outcomes  Short Term: Able to state/look up THRR;Short Term: Able to use daily as guideline for intensity in rehab;Long Term: Able to use THRR to govern intensity when exercising independently       Able to check pulse independently  Yes       Intervention  Provide education and demonstration on how to check pulse in carotid and radial arteries.;Review the importance of being able to check your own pulse for safety during independent exercise       Expected Outcomes  Short Term: Able to explain why pulse checking is important during independent exercise;Long Term: Able to check pulse independently and accurately       Understanding of Exercise Prescription  Yes       Intervention  Provide education, explanation, and written materials on patient's individual exercise prescription       Expected Outcomes  Short Term: Able to explain program exercise prescription;Long Term: Able to explain home exercise prescription to exercise independently          Exercise Goals Re-Evaluation : Exercise Goals Re-Evaluation    Row Name 07/29/17 [phone] 08/05/17 1509 08/07/17 3818  08/14/17 1129 09/02/17 1429     Exercise Goal Re-Evaluation   Exercise Goals Review  Understanding of Exercise Prescription;Knowledge and understanding of  Target Heart Rate Range (THRR);Able to understand and use rate of perceived exertion (RPE) scale  Increase Physical Activity;Understanding of Exercise Prescription;Increase Strength and Stamina  Increase Physical Activity;Able to understand and use rate of perceived exertion (RPE) scale;Knowledge and understanding of Target Heart Rate Range (THRR);Able to check pulse independently;Understanding of Exercise Prescription  Increase Physical Activity;Understanding of Exercise Prescription;Increase Strength and Stamina  Increase Physical Activity;Understanding of Exercise Prescription;Increase Strength and Stamina   Comments  Reviewed RPE scale, THR and program prescription with pt today.  Pt voiced understanding and was given a copy of goals to take home.   Todd Frank is off to a good start in rehab.  He has completed Four full days of exercise.  He has already moved up to level 5 on the XR and is using 5 lbs weights. We will continue to monitor his progress.   Reviewed home exercise with pt today.  Pt plans to use his treadmill at home and [facility].  Reviewed THR, pulse, RPE, sign and symptoms, NTG use, and when to call 911 or MD.  Also discussed weather considerations and indoor options.  Pt voiced understanding.  Todd Frank has been doing well in rehab.  He is feeling stronger and has more energy.  He is doing his treadmill daily for 39mn twice a day, sometimes even three times a day!  He is planning to continue to come to rehab.   MRonalee Beltscontinues to do well in rehab.  He is now up to 8.5 METs and level 10 on the XR.  He is even trying to get his wife to join [employer]!!  We will continue to monitor his progress.    Expected Outcomes  Short: Use RPE daily to regulate intensity.  Long: Follow program prescription in THR.  Short: Reveiw home exercise guidelines.  Long: Continue to attend program regularly.   Short: Add in at least two days of exercise at home.  Long: Continue to build strength  and stamina.   Short: Continue to use treadmill at home.  Long: Continue to exercise independently  Short: Talk about adding in intervals versus just ramping up workloads.  Long: Continue to exercise at home on off days.    RSan GabrielName 09/04/17 0751 09/16/17 1451           Exercise Goal Re-Evaluation   Exercise Goals Review  Increase Physical Activity;Understanding of Exercise Prescription;Increase Strength and Stamina  Increase Physical Activity;Understanding of Exercise Prescription;Increase Strength and Stamina      Comments  MRonalee Beltshas been doing well in rehab.  He is getting between 60-952m at home on the treadmill at the same speed or a little higher.  They did get back the [employer] form.  MiRonalee Beltss getting his strength back and feeling like he has more stamina. We talked about adding in intervals on the treadmill.  He has been doing them on the other pieces.   MiRonalee Beltsontinues to do well in rehab. He is pushing more doing his intervals and really seems to be enjoying having them on all of the equipment.   We will continue to monitor his progress.       Expected Outcomes  Short: Add intervals to treadmill with incline.  Long: Continue to exercise independently.   Short: Graduate!!  Long: Continue to exercise on his  own.          Discharge Exercise Prescription (Final Exercise Prescription Changes): Exercise Prescription Changes - 09/16/17 1400      Response to Exercise   Blood Pressure (Admit)  108/64    Blood Pressure (Exercise)  154/70    Blood Pressure (Exit)  118/60    Heart Rate (Admit)  89 bpm    Heart Rate (Exercise)  133 bpm    Heart Rate (Exit)  66 bpm    Rating of Perceived Exertion (Exercise)  13    Symptoms  none    Duration  Continue with 45 min of aerobic exercise without signs/symptoms of physical distress.    Intensity  THRR unchanged      Progression   Progression  Continue to progress workloads to maintain intensity without signs/symptoms of physical distress.     Average METs  10.33      Resistance Training   Training Prescription  Yes    Weight  8 lbs    Reps  10-15      Interval Training   Interval Training  Yes    Equipment  Treadmill;NuStep;REL-XR    Comments  1 min on 1 min off      Treadmill   MPH  3.4    Grade  8    Minutes  15    METs  7.4      REL-XR   Level  10    Minutes  15    METs  12.1      T5 Nustep   Level  4    Minutes  15    METs  11.5      Home Exercise Plan   Plans to continue exercise at  [facility] (comment) treadmill and [employer]    Frequency  Add 2 additional days to program exercise sessions.    Initial Home Exercises Provided  08/07/17       Nutrition:  Target Goals: Understanding of nutrition guidelines, daily intake of sodium <1511m, cholesterol <2057m calories 30% from fat and 7% or less from saturated fats, daily to have 5 or more servings of fruits and vegetables.  Biometrics: Pre Biometrics - 07/27/17 1423      Pre Biometrics   Height  5' 10.5" (1.791 m)    Weight  185 lb 8 oz (84.1 kg)    Waist Circumference  33 inches    Hip Circumference  38.5 inches    Waist to Hip Ratio  0.86 %    BMI (Calculated)  26.23    Single Leg Stand  30 seconds      Post Biometrics - 09/18/17 0833       Post  Biometrics   Height  5' 10.5" (1.791 m)    Weight  179 lb (81.2 kg)    Waist Circumference  33 inches    Hip Circumference  38 inches    Waist to Hip Ratio  0.87 %    BMI (Calculated)  25.31    Single Leg Stand  30 seconds       Nutrition Therapy Plan and Nutrition Goals: Nutrition Therapy & Goals - 08/17/17 1001      Nutrition Therapy   Diet  TLC    Drug/Food Interactions  Statins/Certain Fruits    Protein (specify units)  10oz    Fiber  30 grams    Whole Grain Foods  3 servings    Saturated Fats  15 max. grams    Fruits and  Vegetables  6 servings/day 8 ideal    Sodium  2000 grams      Personal Nutrition Goals   Nutrition Goal  Transition to cooking with fats that are liquid  at room temperature such as olive oil. Choose spread-able butters rather than traditional stick butter more often    Personal Goal #2  Work to increase variety of meals. Structure meals according to the plate method as discussed, and use this as a tool to cover nutritional bases while still allowing yourself variety as you transition to a more whole-food based style of eating    Personal Goal #3  For the occasional snack, choose snacks that are high in protein such as [place] yogurt, nuts, lower-fat cheese, peanut butter crackers, and cottage cheese    Comments  He has made several positive changes to his diet since hospitalization in March 2019 which has also resulted in weight loss which he desires. He is on track to achieving his goals.      Intervention Plan   Intervention  Nutrition handout(s) given to patient.;Prescribe, educate and counsel regarding individualized specific dietary modifications aiming towards targeted core components such as weight, hypertension, lipid management, diabetes, heart failure and other comorbidities. Gave following a low sodium diet handout    Expected Outcomes  Short Term Goal: Understand basic principles of dietary content, such as calories, fat, sodium, cholesterol and nutrients.;Short Term Goal: A plan has been developed with personal nutrition goals set during dietitian appointment.;Long Term Goal: Adherence to prescribed nutrition plan.       Nutrition Assessments: Nutrition Assessments - 09/18/17 0831      MEDFICTS Scores   Pre Score  18    Post Score  9    Score Difference  -9       Nutrition Goals Re-Evaluation: Nutrition Goals Re-Evaluation    Row Name 08/14/17 1146 08/17/17 1008 09/04/17 0755         Goals   Nutrition Goal  Meet with dietician  Work to increase variety of meals. Structure meals according to the plate method as discussed, and use this as a tool to cover nutritional bases while still allowing yourself variety as you transition to  a more whole-food based style of eating  Work to increase variety of meals. Structure meals according to the plate method as discussed, and use this as a tool to cover nutritional bases while still allowing yourself variety as you transition to a more whole-food based style of eating  Transisition to olive oil     Comment  Todd Frank is scheduled to meet with Lattie Haw during class on Monday. He just wants to make sure he is doing what he is supposed to for his diet.  Since choosing healthier meal options he is starting to feel restricted to the same few foods and desires to learn about how to incorportate more variety while still following a heart healthy diet  Todd Frank has been choosing better options.  He is trying to open up his food options to more variety.  More vegetables and using olive oils as well.   He was already sticking to a diet before,  He has now cut back on his between meals snacking.      Expected Outcome  Short: meet with dietician.  Long: Continue to follow heart healthy diet  He will choose a variety of vegetables, complex carbohydrates / whole grain starches, and proteins that will both meet his nutritional needs and provide him the variety he is looking  for, which will help him stay on track towards his health goals  Short: Continue to add variety.  Long: Continue to follow recommendations.        Personal Goal #2 Re-Evaluation   Personal Goal #2  -  Transition to cooking with fats that are liquid at room temperature such as olive oil. Choose spread-able butters rather than traditional stick butter more often  -       Personal Goal #3 Re-Evaluation   Personal Goal #3  -  For the occasional snack, choose options that are high in protein such as [place] yogurt, nuts, lower-fat cheese, peanut butter crackers, and cottage cheese  -        Nutrition Goals Discharge (Final Nutrition Goals Re-Evaluation): Nutrition Goals Re-Evaluation - 09/04/17 0755      Goals   Nutrition Goal  Work to increase  variety of meals. Structure meals according to the plate method as discussed, and use this as a tool to cover nutritional bases while still allowing yourself variety as you transition to a more whole-food based style of eating  Transisition to olive oil    Comment  Todd Frank has been choosing better options.  He is trying to open up his food options to more variety.  More vegetables and using olive oils as well.   He was already sticking to a diet before,  He has now cut back on his between meals snacking.     Expected Outcome  Short: Continue to add variety.  Long: Continue to follow recommendations.        Psychosocial: Target Goals: Acknowledge presence or absence of significant depression and/or stress, maximize coping skills, provide positive support system. Participant is able to verbalize types and ability to use techniques and skills needed for reducing stress and depression.   Initial Review & Psychosocial Screening: Initial Psych Review & Screening - 07/27/17 1242      Initial Review   Current issues with  Current Stress Concerns Mr. Soule has some concerns about his heart post surgery, but is excited to get back to exercising and his routine. He gets a little nervous with the different "twinges and weird feelings coming from the heart"      [place]?  Yes spouse, sister, friends      Screening Interventions   Interventions  Encouraged to exercise;Program counselor consult;To provide support and resources with identified psychosocial needs    Expected Outcomes  Short Term goal: Utilizing psychosocial counselor, staff and physician to assist with identification of specific Stressors or current issues interfering with healing process. Setting desired goal for each stressor or current issue identified.;Long Term Goal: Stressors or current issues are controlled or eliminated.;Short Term goal: Identification and review with participant of any Quality of Life or  Depression concerns found by scoring the questionnaire.;Long Term goal: The participant improves quality of Life and PHQ9 Scores as seen by post scores and/or verbalization of changes       Quality of Life Scores:  Quality of Life - 09/18/17 0831      Quality of Life Scores   Health/Function Pre  26.03 %    Health/Function Post  25.18 %    Health/Function % Change  -3.27 %    Socioeconomic Pre  28.93 %    Socioeconomic Post  27.14 %    Socioeconomic % Change   -6.19 %    Psych/Spiritual Pre  27.58 %    Psych/Spiritual Post  24.07 %  Psych/Spiritual % Change  -12.73 %    Family Pre  25.5 %    Family Post  27 %    Family % Change  5.88 %    GLOBAL Pre  26.89 %    GLOBAL Post  25.59 %    GLOBAL % Change  -4.83 %      Scores of 19 and below usually indicate a poorer quality of life in these areas.  A difference of  2-3 points is a clinically meaningful difference.  A difference of 2-3 points in the total score of the Quality of Life Index has been associated with significant improvement in overall quality of life, self-image, physical symptoms, and general health in studies assessing change in quality of life.  PHQ-9: Recent Review Flowsheet Data    Depression screen [facility] 2/9 09/08/2017 07/27/2017 02/06/2017 01/09/2016 02/02/2015   Decreased Interest 0 0 0 0 0   Down, Depressed, Hopeless 0 0 0 0 0   PHQ - 2 Score 0 0 0 0 0   Altered sleeping - 1 - - -   Tired, decreased energy - 0 - - -   Change in appetite - 0 - - -   Feeling bad or failure about yourself  - 0 - - -   Trouble concentrating - 0 - - -   Moving slowly or fidgety/restless - 0 - - -   Suicidal thoughts - 0 - - -   PHQ-9 Score - 1 - - -   Difficult doing work/chores - Not difficult at all - - -     Interpretation of Total Score  Total Score Depression Severity:  1-4 = Minimal depression, 5-9 = Mild depression, 10-14 = Moderate depression, 15-19 = Moderately severe depression, 20-27 = Severe depression    Psychosocial Evaluation and Intervention: Psychosocial Evaluation - 07/29/17 0946      Psychosocial Evaluation & Interventions   Interventions  Encouraged to exercise with the program and follow exercise prescription;Stress management education    Comments  Counselor met with Mr. Angela Adam Todd Frank) today for initial psychosocial evaluation.  He is a 62 year old who had a recent aortic valve replaced and a CABGx2.  He has a strong support system with a spouse of 9 years and his sister and parents live locally.  He reports being in fairly good health other than some arthritis in his knee and shoulder and he sleeps well and has a good appetite.  Todd Frank denies a history of depression or anxiety or any current symptoms and states he is typically in a positive mood most of the time.  Stress currently are his health and being on short term disability - not being able to drive himself around.  He has goals to lose some weight while in this program and become more physically fit overall.  Staff will follow with him.     Expected Outcomes  Short:  Todd Frank will meet with the dietician to address his weight loss goals.  Long:  Todd Frank will exercise consistently to increase his stamina and strength and return to more normal activities in his life.      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 He sees his surgeon on Monday and is hoping to get back to work.  He would like to be able to continue to attend rehab even after returning to work.  He continues to sleep well and is enjoying coming to the program.    Todd Frank is doing well with his stress levels.  He will be going back to work on June 24 full  time.  He is has enjoyed having some time off work but is ready to back to work.  He is still sleeping well and doing well in class.       Expected Outcomes  Short: See surgeon to get clearance to return to work.  Long: Continue to stay postive.   Short: Continue to build strength and mentalitly to get back to work.  Long: Continue to cope well with rehab.          Psychosocial Discharge (Final Psychosocial Re-Evaluation): Psychosocial Re-Evaluation - 09/04/17 0757      Psychosocial Re-Evaluation   Current issues with  Current Stress Concerns    Comments  Todd Frank is doing well with his stress levels.  He will be going back to work on June 24 full time.  He is has enjoyed having some time off work but is ready to back to work.  He is still sleeping well and doing well in class.     Expected Outcomes  Short: Continue to build strength and mentalitly to get back to work.  Long: Continue to cope well with rehab.        Vocational Rehabilitation: Provide vocational rehab assistance to qualifying candidates.   Vocational Rehab Evaluation & Intervention: Vocational Rehab - 07/27/17 1245      Initial Vocational Rehab Evaluation & Intervention   Assessment shows need for Vocational Rehabilitation  No       Education: Education Goals: Education classes will be provided on a variety of topics geared toward better understanding of heart health and risk factor modification. Participant will state understanding/return demonstration of topics presented as noted by education test scores.  Learning Barriers/Preferences: Learning Barriers/Preferences - 07/27/17 1245      Learning Barriers/Preferences   Learning Barriers  None    Learning Preferences  None       Education Topics:  AED/CPR: - Group verbal and written instruction with the use of models to demonstrate the basic use of the AED with the basic ABC's of resuscitation.   Cardiac Rehab from 09/21/2017 in [facility] Cardiac and Pulmonary Rehab   Date  08/26/17  Educator  [facility]  Instruction Review Code  1- Verbalizes Understanding      General Nutrition Guidelines/Fats and Fiber: -Group instruction provided by verbal, written material, models and posters to present the general guidelines for heart healthy nutrition. Gives an explanation and review of dietary fats and fiber.   Cardiac Rehab from 09/21/2017 in [facility] Cardiac and Pulmonary Rehab  Date  08/24/17  Educator  CR  Instruction Review Code  1- Verbalizes Understanding      Controlling Sodium/Reading Food Labels: -Group verbal and written material supporting the discussion of sodium use in heart healthy nutrition. Review and explanation with models, verbal and written materials for utilization of the food label.   Cardiac Rehab from 09/21/2017 in [facility] Cardiac and Pulmonary Rehab  Date  09/07/17  Educator  CR  Instruction Review Code  1- Verbalizes Understanding      Exercise Physiology &  General Exercise Guidelines: - Group verbal and written instruction with models to review the exercise physiology of the cardiovascular system and associated critical values. Provides general exercise guidelines with specific guidelines to those with heart or lung disease.    Cardiac Rehab from 09/21/2017 in [facility] Cardiac and Pulmonary Rehab  Date  09/14/17  Educator  [facility]  Instruction Review Code  1- Verbalizes Understanding      Aerobic Exercise & Resistance Training: - Gives group verbal and written instruction on the various components of exercise. Focuses on aerobic and resistive training programs and the benefits of this training and how to safely progress through these programs..   Cardiac Rehab from 09/21/2017 in [facility] Cardiac and Pulmonary Rehab  Date  09/21/17  Educator  [facility]  Instruction Review Code  1- Geologist, engineering, Balance, Mind/Body Relaxation: Provides group verbal/written instruction on the benefits of flexibility and balance training, including  mind/body exercise modes such as yoga, pilates and tai chi.  Demonstration and skill practice provided.   Stress and Anxiety: - Provides group verbal and written instruction about the health risks of elevated stress and causes of high stress.  Discuss the correlation between heart/lung disease and anxiety and treatment options. Review healthy ways to manage with stress and anxiety.   Cardiac Rehab from 09/21/2017 in [facility] Cardiac and Pulmonary Rehab  Date  08/05/17  Educator  [facility]  Instruction Review Code  1- Verbalizes Understanding      Depression: - Provides group verbal and written instruction on the correlation between heart/lung disease and depressed mood, treatment options, and the stigmas associated with seeking treatment.   Cardiac Rehab from 09/21/2017 in [facility] Cardiac and Pulmonary Rehab  Date  09/16/17  Educator  [facility]  Instruction Review Code  1- Verbalizes Understanding      Anatomy & Physiology of the Heart: - Group verbal and written instruction and models provide basic cardiac anatomy and physiology, with the coronary electrical and arterial systems. Review of Valvular disease and Heart Failure   Cardiac Rehab from 09/21/2017 in [facility] Cardiac and Pulmonary Rehab  Date  08/03/17  Educator  SB  Instruction Review Code  1- Verbalizes Understanding      Cardiac Procedures: - Group verbal and written instruction to review commonly prescribed medications for heart disease. Reviews the medication, class of the drug, and side effects. Includes the steps to properly store meds and maintain the prescription regimen. (beta blockers and nitrates)   Cardiac Rehab from 09/21/2017 in [facility] Cardiac and Pulmonary Rehab  Date  08/17/17  Educator  SB  Instruction Review Code  1- Verbalizes Understanding      Cardiac Medications I: - Group verbal and written instruction to review commonly prescribed medications for heart disease. Reviews the medication, class of the drug, and side effects.  Includes the steps to properly store meds and maintain the prescription regimen.   Cardiac Rehab from 09/21/2017 in [facility] Cardiac and Pulmonary Rehab  Date  08/10/17  Educator  SB  Instruction Review Code  1- Verbalizes Understanding      Cardiac Medications II: -Group verbal and written instruction to review commonly prescribed medications for heart disease. Reviews the medication, class of the drug, and side effects. (all other drug classes)   Cardiac Rehab from 09/21/2017 in [facility] Cardiac and Pulmonary Rehab  Date  09/09/17  Educator  SB  Instruction Review Code  1- Verbalizes Understanding       Go Sex-Intimacy & Heart Disease, Get  SMART - Goal Setting: - Group verbal and written instruction through game format to discuss heart disease and the return to sexual intimacy. Provides group verbal and written material to discuss and apply goal setting through the application of the S.M.A.R.T. Method.   Cardiac Rehab from 09/21/2017 in [facility] Cardiac and Pulmonary Rehab  Date  08/17/17  Educator  SB  Instruction Review Code  1- Verbalizes Understanding      Other Matters of the Heart: - Provides group verbal, written materials and models to describe Stable Angina and Peripheral Artery. Includes description of the disease process and treatment options available to the cardiac patient.   Cardiac Rehab from 09/21/2017 in [facility] Cardiac and Pulmonary Rehab  Date  08/03/17  Educator  SB  Instruction Review Code  1- Verbalizes Understanding      Exercise & Equipment Safety: - Individual verbal instruction and demonstration of equipment use and safety with use of the equipment.   Cardiac Rehab from 09/21/2017 in [facility] Cardiac and Pulmonary Rehab  Date  07/27/17  Educator  [facility]  Instruction Review Code  1- Verbalizes Understanding      Infection Prevention: - Provides verbal and written material to individual with discussion of infection control including proper hand washing and proper equipment  cleaning during exercise session.   Cardiac Rehab from 09/21/2017 in [facility] Cardiac and Pulmonary Rehab  Date  07/27/17  Educator  [facility]  Instruction Review Code  1- Verbalizes Understanding      Falls Prevention: - Provides verbal and written material to individual with discussion of falls prevention and safety.   Cardiac Rehab from 09/21/2017 in [facility] Cardiac and Pulmonary Rehab  Date  07/27/17  Educator  [facility]  Instruction Review Code  1- Verbalizes Understanding      Diabetes: - Individual verbal and written instruction to review signs/symptoms of diabetes, desired ranges of glucose level fasting, after meals and with exercise. Acknowledge that pre and post exercise glucose checks will be done for 3 sessions at entry of program.   Know Your Numbers and Risk Factors: -Group verbal and written instruction about important numbers in your health.  Discussion of what are risk factors and how they play a role in the disease process.  Review of Cholesterol, Blood Pressure, Diabetes, and BMI and the role they play in your overall health.   Cardiac Rehab from 09/21/2017 in [facility] Cardiac and Pulmonary Rehab  Date  09/09/17  Educator  SB  Instruction Review Code  1- Verbalizes Understanding      Sleep Hygiene: -Provides group verbal and written instruction about how sleep can affect your health.  Define sleep hygiene, discuss sleep cycles and impact of sleep habits. Review good sleep hygiene tips.    Cardiac Rehab from 09/21/2017 in [facility] Cardiac and Pulmonary Rehab  Date  08/19/17  Educator  [facility]  Instruction Review Code  1- Verbalizes Understanding      Other: -Provides group and verbal instruction on various topics (see comments)   Knowledge Questionnaire Score: Knowledge Questionnaire Score - 09/18/17 0831      Knowledge Questionnaire Score   Pre Score  28/28    Post Score  27/28       Core Components/Risk Factors/Patient Goals at Admission: Personal Goals and Risk Factors at  Admission - 07/27/17 1237      Core Components/Risk Factors/Patient Goals on Admission    Weight Management  Yes;Weight Loss    Intervention  Weight Management: Develop a combined nutrition and exercise program designed to  reach desired caloric intake, while maintaining appropriate intake of nutrient and fiber, sodium and fats, and appropriate energy expenditure required for the weight goal.;Weight Management: Provide education and appropriate resources to help participant work on and attain dietary goals.;Weight Management/Obesity: Establish reasonable short term and long term weight goals.    Admit Weight  185 lb (83.9 kg)    Goal Weight: Short Term  180 lb (81.6 kg)    Goal Weight: Long Term  160 lb (72.6 kg)    Expected Outcomes  Short Term: Continue to assess and modify interventions until short term weight is achieved;Long Term: Adherence to nutrition and physical activity/exercise program aimed toward attainment of established weight goal;Weight Loss: Understanding of general recommendations for a balanced deficit meal plan, which promotes 1-2 lb weight loss per week and includes a negative energy balance of [id number] kcal/d;Understanding recommendations for meals to include 15-35% energy as protein, 25-35% energy from fat, 35-60% energy from carbohydrates, less than 26m of dietary cholesterol, 20-35 gm of total fiber daily;Understanding of distribution of calorie intake throughout the day with the consumption of 4-5 meals/snacks    Hypertension  Yes    Intervention  Provide education on lifestyle modifcations including regular physical activity/exercise, weight management, moderate sodium restriction and increased consumption of fresh fruit, vegetables, and low fat dairy, alcohol moderation, and smoking cessation.;Monitor prescription use compliance.    Expected Outcomes  Short Term: Continued assessment and intervention until BP is < 140/945mHG in hypertensive participants. < 130/8053mG in  hypertensive participants with diabetes, heart failure or chronic kidney disease.;Long Term: Maintenance of blood pressure at goal levels.    Lipids  Yes    Intervention  Provide education and support for participant on nutrition & aerobic/resistive exercise along with prescribed medications to achieve LDL <54m66mDL >40mg50m Expected Outcomes  Short Term: Participant states understanding of desired cholesterol values and is compliant with medications prescribed. Participant is following exercise prescription and nutrition guidelines.;Long Term: Cholesterol controlled with medications as prescribed, with individualized exercise RX and with personalized nutrition plan. Value goals: LDL < 54mg,8m > 40 mg.       Core Components/Risk Factors/Patient Goals Review:  Goals and Risk Factor Review    [place] Name 08/14/17 1143 09/04/17 0754           Core Components/Risk Factors/Patient Goals Review   Personal Goals Review  Weight Management/Obesity;Lipids;Hypertension  Weight Management/Obesity;Lipids;Hypertension      Review  Todd Frank hRonalee Beltseen doing well with rehab.  He is weight is coming down slowly.  He is losing between 1-2 lbs a week!!  Blood pressures have all been good and he is checking it at home.  He is doing well on all of his medications.   Todd Frank iRonalee Beltswn another pound this week.  He is long term goal is 160lbs, at home he is at 175lbs.  He wants to continue lose.  He continues to check blood pressures.       Expected Outcomes  Short: Continue to work on weight loss.  Long: Continue to monitor risk factors.   Short: Continue to lose weight at a pound a week.  Long: Continue get down to 160lbs.          Core Components/Risk Factors/Patient Goals at Discharge (Final Review):  Goals and Risk Factor Review - 09/04/17 0754      Core Components/Risk Factors/Patient Goals Review   Personal Goals Review  Weight Management/Obesity;Lipids;Hypertension    Review  Todd Frank iRonalee Beltswn  another pound this week.   He is long term goal is 160lbs, at home he is at 175lbs.  He wants to continue lose.  He continues to check blood pressures.     Expected Outcomes  Short: Continue to lose weight at a pound a week.  Long: Continue get down to 160lbs.        ITP Comments: ITP Comments    Row Name 07/27/17 1231 07/29/17 0603 08/26/17 0550 09/21/17 0756     ITP Comments  Med Review completed. Initial ITP created. Diagnosis can be found in [facility] 06/25/17  30 day review. Continue with ITP unless directed changes per Medical Director     New to program  30 day review. Continue with ITP unless directed changes per Medical Director  Discharge ITP sent and signed by  Dr. Caryl Comes for Dr. Sabra Heck, Medical Director.  Discharge Summary routed to PCP and cardiologist.       Comments: Discharge ITP

## 2017-09-21 NOTE — Progress Notes (Signed)
Discharge Progress Report  Patient Details  Name: Todd Frank MRN: [id number] Date of Birth: 08/19/1955 Referring Provider:     Cardiac Rehab from 07/27/2017 in [facility] Cardiac and Pulmonary Rehab  Referring Provider  [name] MD       Number of Visits: 36  Reason for Discharge:  Patient reached a stable level of exercise. Patient independent in their exercise. Patient has met program and personal goals.  Smoking History:  Social History   Tobacco Use  Smoking Status Former Smoker  . Last attempt to quit: 1977  . Years since quitting: 42.4  Smokeless Tobacco Never Used  Tobacco Comment   quit in 78, teenager    Diagnosis:  S/P aortic valve repair  ADL UCSD:   Initial Exercise Prescription: Initial Exercise Prescription - 07/27/17 1400      Date of Initial Exercise RX and Referring Provider   Date  07/27/17    Referring Provider  [name] MD      Treadmill   MPH  2.9    Grade  1    Minutes  15    METs  3.62      REL-XR   Level  2    Speed  50    Minutes  15    METs  3.6      T5 Nustep   Level  3    SPM  80    Minutes  15    METs  3.6      Prescription Details   Frequency (times per week)  3    Duration  Progress to 45 minutes of aerobic exercise without signs/symptoms of physical distress      Intensity   THRR 40-80% of Max Heartrate  99-138    Ratings of Perceived Exertion  11-13    Perceived Dyspnea  0-4      Progression   Progression  Continue to progress workloads to maintain intensity without signs/symptoms of physical distress.      Resistance Training   Training Prescription  Yes    Weight  4 lbs    Reps  10-15       Discharge Exercise Prescription (Final Exercise Prescription Changes): Exercise Prescription Changes - 09/16/17 1400      Response to Exercise   Blood Pressure (Admit)  108/64    Blood Pressure (Exercise)  154/70    Blood Pressure (Exit)  118/60    Heart Rate (Admit)  89 bpm    Heart Rate (Exercise)   133 bpm    Heart Rate (Exit)  66 bpm    Rating of Perceived Exertion (Exercise)  13    Symptoms  none    Duration  Continue with 45 min of aerobic exercise without signs/symptoms of physical distress.    Intensity  THRR unchanged      Progression   Progression  Continue to progress workloads to maintain intensity without signs/symptoms of physical distress.    Average METs  10.33      Resistance Training   Training Prescription  Yes    Weight  8 lbs    Reps  10-15      Interval Training   Interval Training  Yes    Equipment  Treadmill;NuStep;REL-XR    Comments  1 min on 1 min off      Treadmill   MPH  3.4    Grade  8    Minutes  15    METs  7.4  REL-XR   Level  10    Minutes  15    METs  12.1      T5 Nustep   Level  4    Minutes  15    METs  11.5      Home Exercise Plan   Plans to continue exercise at  [facility] (comment[facility] treadmill and [employer]    Frequency  Add 2 additional days to program exercise sessions.    Initial Home Exercises Provided  08/07/17       Functional Capacity: 6 Minute Walk    Row Name 07/27/17 1406 09/18/17 0830       6 Minute Walk   Phase  Initial  Discharge    Distance  1550 feet  2100 feet    Distance % Change  -  35.5 %    Distance Feet Change  -  550 ft    Walk Time  6 minutes  6 minutes    # of Rest Breaks  0  0    MPH  2.94  3.98    METS  3.93  5.05    RPE  10  11    VO2 Peak  13.74  17.66    Symptoms  No  No    Resting HR  60 bpm  81 bpm    Resting BP  134/74  122/66    Resting Oxygen Saturation   99 %  -    Exercise Oxygen Saturation  during 6 min walk  98 %  -    Max Ex. HR  94 bpm  115 bpm    Max Ex. BP  142/78  128/72    2 Minute Post BP  126/68  -       Psychological, QOL, Others - Outcomes: PHQ 2/9: Depression screen [facility] 2/9 09/08/2017 07/27/2017 02/06/2017 01/09/2016 02/02/2015  Decreased Interest 0 0 0 0 0  Down, Depressed, Hopeless 0 0 0 0 0  PHQ - 2 Score 0 0 0 0 0  Altered sleeping - 1 - - -   Tired, decreased energy - 0 - - -  Change in appetite - 0 - - -  Feeling bad or failure about yourself  - 0 - - -  Trouble concentrating - 0 - - -  Moving slowly or fidgety/restless - 0 - - -  Suicidal thoughts - 0 - - -  PHQ-9 Score - 1 - - -  Difficult doing work/chores - Not difficult at all - - -    Quality of Life: Quality of Life - 09/18/17 0831      Quality of Life Scores   Health/Function Pre  26.03 %    Health/Function Post  25.18 %    Health/Function % Change  -3.27 %    Socioeconomic Pre  28.93 %    Socioeconomic Post  27.14 %    Socioeconomic % Change   -6.19 %    Psych/Spiritual Pre  27.58 %    Psych/Spiritual Post  24.07 %    Psych/Spiritual % Change  -12.73 %    Family Pre  25.5 %    Family Post  27 %    Family % Change  5.88 %    GLOBAL Pre  26.89 %    GLOBAL Post  25.59 %    GLOBAL % Change  -4.83 %       Personal Goals: Goals established at orientation with interventions provided to work toward goal. Personal Goals  and Risk Factors at Admission - 07/27/17 1237      Core Components/Risk Factors/Patient Goals on Admission    Weight Management  Yes;Weight Loss    Intervention  Weight Management: Develop a combined nutrition and exercise program designed to reach desired caloric intake, while maintaining appropriate intake of nutrient and fiber, sodium and fats, and appropriate energy expenditure required for the weight goal.;Weight Management: Provide education and appropriate resources to help participant work on and attain dietary goals.;Weight Management/Obesity: Establish reasonable short term and long term weight goals.    Admit Weight  185 lb (83.9 kg)    Goal Weight: Short Term  180 lb (81.6 kg)    Goal Weight: Long Term  160 lb (72.6 kg)    Expected Outcomes  Short Term: Continue to assess and modify interventions until short term weight is achieved;Long Term: Adherence to nutrition and physical activity/exercise program aimed toward attainment of  established weight goal;Weight Loss: Understanding of general recommendations for a balanced deficit meal plan, which promotes 1-2 lb weight loss per week and includes a negative energy balance of [phone] kcal/d;Understanding recommendations for meals to include 15-35% energy as protein, 25-35% energy from fat, 35-60% energy from carbohydrates, less than 273m of dietary cholesterol, 20-35 gm of total fiber daily;Understanding of distribution of calorie intake throughout the day with the consumption of 4-5 meals/snacks    Hypertension  Yes    Intervention  Provide education on lifestyle modifcations including regular physical activity/exercise, weight management, moderate sodium restriction and increased consumption of fresh fruit, vegetables, and low fat dairy, alcohol moderation, and smoking cessation.;Monitor prescription use compliance.    Expected Outcomes  Short Term: Continued assessment and intervention until BP is < 140/931mHG in hypertensive participants. < 130/8039mG in hypertensive participants with diabetes, heart failure or chronic kidney disease.;Long Term: Maintenance of blood pressure at goal levels.    Lipids  Yes    Intervention  Provide education and support for participant on nutrition & aerobic/resistive exercise along with prescribed medications to achieve LDL <12m75mDL >40mg24m Expected Outcomes  Short Term: Participant states understanding of desired cholesterol values and is compliant with medications prescribed. Participant is following exercise prescription and nutrition guidelines.;Long Term: Cholesterol controlled with medications as prescribed, with individualized exercise RX and with personalized nutrition plan. Value goals: LDL < 12mg,39m > 40 mg.        Personal Goals Discharge: Goals and Risk Factor Review    Row Name 08/14/17 1143 09/04/17 0754           Core Components/Risk Factors/Patient Goals Review   Personal Goals Review  Weight  Management/Obesity;Lipids;Hypertension  Weight Management/Obesity;Lipids;Hypertension      Review  Mike hRonalee Beltseen doing well with rehab.  He is weight is coming down slowly.  He is losing between 1-2 lbs a week!!  Blood pressures have all been good and he is checking it at home.  He is doing well on all of his medications.   Mike iRonalee Beltswn another pound this week.  He is long term goal is 160lbs, at home he is at 175lbs.  He wants to continue lose.  He continues to check blood pressures.       Expected Outcomes  Short: Continue to work on weight loss.  Long: Continue to monitor risk factors.   Short: Continue to lose weight at a pound a week.  Long: Continue get down to 160lbs.          Exercise  Goals and Review: Exercise Goals    Row Name 07/27/17 1423             Exercise Goals   Increase Physical Activity  Yes       Intervention  Provide advice, education, support and counseling about physical activity/exercise needs.;Develop an individualized exercise prescription for aerobic and resistive training based on initial evaluation findings, risk stratification, comorbidities and participant's personal goals.       Expected Outcomes  Short Term: Attend rehab on a regular basis to increase amount of physical activity.;Long Term: Add in home exercise to make exercise part of routine and to increase amount of physical activity.;Long Term: Exercising regularly at least 3-5 days a week.       Increase Strength and Stamina  Yes       Intervention  Provide advice, education, support and counseling about physical activity/exercise needs.;Develop an individualized exercise prescription for aerobic and resistive training based on initial evaluation findings, risk stratification, comorbidities and participant's personal goals.       Expected Outcomes  Short Term: Increase workloads from initial exercise prescription for resistance, speed, and METs.;Short Term: Perform resistance training exercises routinely  during rehab and add in resistance training at home;Long Term: Improve cardiorespiratory fitness, muscular endurance and strength as measured by increased METs and functional capacity (6MWT)       Able to understand and use rate of perceived exertion (RPE) scale  Yes       Intervention  Provide education and explanation on how to use RPE scale       Expected Outcomes  Short Term: Able to use RPE daily in rehab to express subjective intensity level;Long Term:  Able to use RPE to guide intensity level when exercising independently       Knowledge and understanding of Target Heart Rate Range (THRR)  Yes       Intervention  Provide education and explanation of THRR including how the numbers were predicted and where they are located for reference       Expected Outcomes  Short Term: Able to state/look up THRR;Short Term: Able to use daily as guideline for intensity in rehab;Long Term: Able to use THRR to govern intensity when exercising independently       Able to check pulse independently  Yes       Intervention  Provide education and demonstration on how to check pulse in carotid and radial arteries.;Review the importance of being able to check your own pulse for safety during independent exercise       Expected Outcomes  Short Term: Able to explain why pulse checking is important during independent exercise;Long Term: Able to check pulse independently and accurately       Understanding of Exercise Prescription  Yes       Intervention  Provide education, explanation, and written materials on patient's individual exercise prescription       Expected Outcomes  Short Term: Able to explain program exercise prescription;Long Term: Able to explain home exercise prescription to exercise independently          Nutrition & Weight - Outcomes: Pre Biometrics - 07/27/17 1423      Pre Biometrics   Height  5' 10.5" (1.791 m)    Weight  185 lb 8 oz (84.1 kg)    Waist Circumference  33 inches    Hip  Circumference  38.5 inches    Waist to Hip Ratio  0.86 %    BMI (Calculated)  26.23  Single Leg Stand  30 seconds      Post Biometrics - 09/18/17 4431       Post  Biometrics   Height  5' 10.5" (1.791 m)    Weight  179 lb (81.2 kg)    Waist Circumference  33 inches    Hip Circumference  38 inches    Waist to Hip Ratio  0.87 %    BMI (Calculated)  25.31    Single Leg Stand  30 seconds       Nutrition: Nutrition Therapy & Goals - 08/17/17 1001      Nutrition Therapy   Diet  TLC    Drug/Food Interactions  Statins/Certain Fruits    Protein (specify units)  10oz    Fiber  30 grams    Whole Grain Foods  3 servings    Saturated Fats  15 max. grams    Fruits and Vegetables  6 servings/day 8 ideal    Sodium  2000 grams      Personal Nutrition Goals   Nutrition Goal  Transition to cooking with fats that are liquid at room temperature such as olive oil. Choose spread-able butters rather than traditional stick butter more often    Personal Goal #2  Work to increase variety of meals. Structure meals according to the plate method as discussed, and use this as a tool to cover nutritional bases while still allowing yourself variety as you transition to a more whole-food based style of eating    Personal Goal #3  For the occasional snack, choose snacks that are high in protein such as [place] yogurt, nuts, lower-fat cheese, peanut butter crackers, and cottage cheese    Comments  He has made several positive changes to his diet since hospitalization in March 2019 which has also resulted in weight loss which he desires. He is on track to achieving his goals.      Intervention Plan   Intervention  Nutrition handout(s) given to patient.;Prescribe, educate and counsel regarding individualized specific dietary modifications aiming towards targeted core components such as weight, hypertension, lipid management, diabetes, heart failure and other comorbidities. Gave following a low sodium diet handout     Expected Outcomes  Short Term Goal: Understand basic principles of dietary content, such as calories, fat, sodium, cholesterol and nutrients.;Short Term Goal: A plan has been developed with personal nutrition goals set during dietitian appointment.;Long Term Goal: Adherence to prescribed nutrition plan.       Nutrition Discharge: Nutrition Assessments - 09/18/17 0831      MEDFICTS Scores   Pre Score  18    Post Score  9    Score Difference  -9       Education Questionnaire Score: Knowledge Questionnaire Score - 09/18/17 0831      Knowledge Questionnaire Score   Pre Score  28/28    Post Score  27/28       Goals reviewed with patient; copy given to patient.

## 2017-09-21 NOTE — Progress Notes (Signed)
Daily Session Note  Patient Details  Name: Todd Frank MRN: [id number] Date of Birth: [birth year] Referring Provider:     Cardiac Rehab from 07/27/2017 in [facility] Cardiac and Pulmonary Rehab  Referring Provider  [name] MD      Encounter Date: 09/21/2017  Check In: Session Check In - 09/21/17 0754      Check-In   Location  [facility]-Cardiac & Pulmonary Rehab    Staff Present  Earlean Shawl, BS, ACSM CEP, Exercise Physiologist;Susanne Bice, RN, BSN, Gordy Councilman, MA, RCEP, CCRP, Exercise Physiologist    Supervising physician immediately available to respond to emergencies  See telemetry face sheet for immediately available ER MD    Medication changes reported      No    Fall or balance concerns reported     No    Warm-up and Cool-down  Performed on first and last piece of equipment    Resistance Training Performed  Yes    VAD Patient?  No      Pain Assessment   Currently in Pain?  No/denies          Social History   Tobacco Use  Smoking Status Former Smoker  . Last attempt to quit: 1977  . Years since quitting: 42.4  Smokeless Tobacco Never Used  Tobacco Comment   quit in 78, teenager    Goals Met:  Independence with exercise equipment Exercise tolerated well Personal goals reviewed No report of cardiac concerns or symptoms Strength training completed today  Goals Unmet:  Not Applicable  Comments:  Todd Frank graduated today from  rehab with 36 sessions completed.  Details of the patient's exercise prescription and what He needs to do in order to continue the prescription and progress were discussed with patient.  Patient was given a copy of prescription and goals.  Patient verbalized understanding. Mikeplans to continue to exercise by going to [facility].    Dr. Emily Filbert is Medical Director for [place] [facility].

## 2017-09-29 ENCOUNTER — Other Ambulatory Visit: Payer: Self-pay | Admitting: Family Medicine

## 2017-09-29 DIAGNOSIS — J3089 Other allergic rhinitis: Secondary | ICD-10-CM

## 2017-10-01 NOTE — Telephone Encounter (Signed)
Fluticasone refill Last Refill:08/25/16 # 16g 12 RF Last OV: 05/14/15 PCP: Park Liter DO Pharmacy:[facility]

## 2017-10-02 NOTE — Telephone Encounter (Signed)
It looks as if the pt has transferred his care to [facility], forwarding to PCP.

## 2017-10-15 LAB — HEMOGLOBIN A1C: Hemoglobin A1C: 5.5

## 2017-10-15 LAB — LIPID PANEL
CHOLESTEROL: 100 (ref 0–200)
HDL: 50 (ref 35–70)
LDL Cholesterol: 36
Triglycerides: 69 (ref 40–160)

## 2017-10-15 LAB — MEASLES/MUMPS/RUBELLA IMMUNITY
MUMPS ABS, IGG: 66.4
RUBEOLA AB, IGG: >300
Rubella Antibodies, IGG: 10.5

## 2017-10-15 LAB — BASIC METABOLIC PANEL
CREATININE: 0.9 (ref 0.6–1.3)
GLUCOSE: 94

## 2017-10-16 ENCOUNTER — Encounter: Payer: Self-pay | Admitting: Family Medicine

## 2017-10-23 ENCOUNTER — Encounter: Payer: Self-pay | Admitting: Cardiology

## 2017-10-28 ENCOUNTER — Telehealth: Payer: Self-pay | Admitting: *Deleted

## 2017-10-28 DIAGNOSIS — R42 Dizziness and giddiness: Secondary | ICD-10-CM

## 2017-10-28 DIAGNOSIS — R5383 Other fatigue: Secondary | ICD-10-CM

## 2017-10-28 DIAGNOSIS — I10 Essential (primary) hypertension: Secondary | ICD-10-CM

## 2017-10-28 NOTE — Telephone Encounter (Signed)
PT AWARE OF RECOMMENDATIONS AND AGREES WITH PLAN [facility]CY 

## 2017-10-28 NOTE — Telephone Encounter (Signed)
-----   Message from Todd Campbell, LPN sent at 09/11/43  1:21 PM EDT ----- PT NEEDS SLEEP STUDY PER DR [name] NOT SURE IF PUT RIGHT ORDER IN [place]

## 2017-10-28 NOTE — Telephone Encounter (Signed)
More Detail >>  RE: Non-Urgent Medical Question  [name], MD  Sent: Sun October 25, 2017 12:48 PM  To: [name], LPN      Message   Please find out if patient felt any better with fatigue and dizziness after his surgery. Have him check his BP and HR daily for a week and call with Results. Have him come in for TSH and CBC and set up for sleep study to rule out sleep apnea that could cause fatigue.     OK to get Shingles vaccine. He does not need SBE prophylaxis for removal of skin lesions unless they are infected    [name]  ----- Message -----  From: [name], LPN  Sent: 8/54/6270  1:58 PM  To: [name], MD  Subject: FW: Non-Urgent Medical Question               ----- Message -----  From: [name]  Sent: 10/23/2017  1:43 PM  To: [name]  Subject: Non-Urgent Medical Question             ----- Message from Mychart, Generic sent at 10/23/2017 1:43 PM EDT -----    [name],    I have a few questions.    1. Much of the time I feel light headed and somewhat fatigued. The light headedness preceeds my surgery by quite a while. I've seen my GP and an ENT about this before and after my surgery.  The ENT can't find anything and suggested that I see my cardiologist because of my cardiac history. I don't know if this is heart related but I'd like to discuss this with you or one of your PAs or NPs.    2. Is it okay for me to get the Shingrix vaccine? My mother recently contracted shingles and that's got me thinking about it.     3. I have an annual exam scheduled with my dermatologist in about a month. I've had malignant melanoma in the past and he sometimes finds something that needs to be frozen, burned or cut off. As with a dental exam, should I take antibiotics prior to having anything removed?     Thanks and have a great weekend,    Todd Frank [facility])    Todd Frank    10/23/2017  Patient Email  MRN:  [id number]  Description: 62 year old male Provider: [name], MD Department: [address] Office     Call Documentation   No notes of this type exist for this encounter.  Encounter MyChart Messages   Read Composed From To Subject  Y 10/23/2017 1:43 PM [name] "Todd Frank" [name], MD Non-Urgent Medical Question  Routing History   Priority Sent On From To Message Type   10/25/2017 12:48 PM [name], [name], MD [name], LPN Pt Advice Request   10/23/2017 1:58 PM [name], LPN [name], MD Pt Advice Request   10/23/2017 1:43 PM Mychart, Generic P [facility] [address] [facility] Triage Pt Advice Request  Created by   Mychart, Generic on 10/23/2017 01:43 PM  [place], [place]

## 2017-10-30 NOTE — Telephone Encounter (Signed)
  Freada Bergeron, CMA  [name], MD        The note needs to be in the office visit note and mention fatigue and possible sleep apnea so Mariann Laster can send it to the insurance carrier.  Thank you     ----- Message -----  From: Freada Bergeron, CMA  Sent: 10/30/2017  2:05 PM  To: [name], MD  Subject: 5-24 Telephone note                Per Mariann Laster please addend your telephone note on 5/24 to mention fatigue or possible sleep apnea

## 2017-11-03 ENCOUNTER — Telehealth: Payer: Self-pay | Admitting: *Deleted

## 2017-11-03 NOTE — Telephone Encounter (Signed)
Submitted PA via fax to [facility] for in lab split night sleep study.

## 2017-11-03 NOTE — Telephone Encounter (Signed)
-----   Message from Freada Bergeron, [place] sent at 10/28/2017  4:21 PM EDT ----- Regarding: pre cert split night

## 2017-11-04 ENCOUNTER — Encounter: Payer: Self-pay | Admitting: Cardiology

## 2017-11-04 ENCOUNTER — Telehealth: Payer: Self-pay | Admitting: *Deleted

## 2017-11-04 NOTE — Telephone Encounter (Signed)
Received a fax from [employer] stating patient's plan is not managed by them. I will need to reach out to the actual plan for sleep authorization. I called the number on the back of the card and the sleep PA was imitated via phone with CSR. Clinicals were faxed per their request to [phone] that was received via fax that has a barcode.  Also faxed to a alternate number, [phone] given to me by the CSR .

## 2017-11-11 ENCOUNTER — Telehealth: Payer: Self-pay | Admitting: Cardiology

## 2017-11-11 NOTE — Telephone Encounter (Signed)
Spoke to patient who is informing us that he remains fatigued and experiencing dizziness.  He was inquiring about his recent (7/24) lab results and what advice you gave on his recent BP monitoring.  I let him know that you were satisfied with his BP readings, but apparently no one got back to him on lab results.  I guess he saw them on MyChart.

## 2017-11-11 NOTE — Telephone Encounter (Signed)
Spoke to patient with recommendations from Dr Radford Pax.  He verbalized understanding.

## 2017-11-11 NOTE — Telephone Encounter (Signed)
Returned a call to patient. He has questions concerning some blood tests that he had done as well as other questions related to some blood pressure readings that he reported. I informed the patient that I would have to defer the call to Dr Radford Pax or her nurse. However I did provide the patient with update on the sleep study appointment request. Message will be routed back to Dr Theodosia Blender office to call the patient to see if they can assist him with his concerns.

## 2017-11-11 NOTE — Telephone Encounter (Signed)
I reviewed lab from end of July and they looked good.  Needs to see PCP for ongoing fatigue

## 2017-11-11 NOTE — Telephone Encounter (Signed)
New Message        Patient returned call checking status on information.

## 2017-11-13 ENCOUNTER — Telehealth: Payer: Self-pay | Admitting: *Deleted

## 2017-11-13 ENCOUNTER — Encounter: Payer: Self-pay | Admitting: Family Medicine

## 2017-11-13 ENCOUNTER — Ambulatory Visit: Payer: Managed Care, Other (non HMO) | Admitting: Family Medicine

## 2017-11-13 VITALS — BP 127/83 | HR 76 | Temp 97.8°F | Resp 16 | Ht 69.5 in | Wt 185.0 lb

## 2017-11-13 DIAGNOSIS — R42 Dizziness and giddiness: Secondary | ICD-10-CM | POA: Diagnosis not present

## 2017-11-13 DIAGNOSIS — D696 Thrombocytopenia, unspecified: Secondary | ICD-10-CM

## 2017-11-13 DIAGNOSIS — R531 Weakness: Secondary | ICD-10-CM

## 2017-11-13 DIAGNOSIS — R5382 Chronic fatigue, unspecified: Secondary | ICD-10-CM | POA: Diagnosis not present

## 2017-11-13 NOTE — Patient Instructions (Addendum)
Thank you for coming to the office today.  We will check some additional labs at [facility] within the week for further anemia panel and checking into platelets and blood smear review. Stay tuned on these results and if normal, we will refer you to Neurology for 2nd opinion  Stay tuned  [facility]   Address: [address] [place], [place] [address] Hours: 8AM-5PM Phone: [phone]   If needed can send to [facility] neuro instead  [facility] [place]. [place], [place] [address] Phone: [phone]  DUE for FASTING BLOOD WORK (no food or drink after midnight before the lab appointment, only water or coffee without cream/sugar on the morning of)  lab draw in 3 MONTHS  NOTIFY us FOR RELEASE OF [facility] ORDERS  For Lab Results, once available within 2-3 days of blood draw, you can can log in to MyChart online to view your results and a brief explanation. Also, we can discuss results at next follow-up visit.   Please schedule a Follow-up Appointment to: Return in about 4 weeks (around 12/11/2017), or if symptoms worsen or fail to improve, for follow-up in 02/2018 as scheduled.  If you have any other questions or concerns, please feel free to call the office or send a message through [place]. You may also schedule an earlier appointment if necessary.  Additionally, you may be receiving a survey about your experience at our office within a few days to 1 week by e-mail or mail. We value your feedback.  Nobie Putnam, DO [place]

## 2017-11-13 NOTE — Progress Notes (Signed)
Subjective:    Patient ID: Todd Frank, male    DOB: 02-05-1956, 62 y.o.   MRN: [id number]  Todd Frank is a 62 y.o. male presenting on 11/13/2017 for Hypertension and Fatigue   HPI   FOLLOW-UP CHRONIC FATIGUE vs WEAKNESS with LIGHTHEADEDNESS / DIZZINESS Last visits discussed same problems back in 02/2017 and also 09/2017, most recently he was treated for chronic sinusitis and referred back to [facility] ENT Dr Richardson Landry, and they did a complete work-up without identification of vestibular vertigo or inner ear problem and his sinuses were treated, they thought it may be cardiac or neurological. - Interval update - patient was advised to return to Cardiology to discuss given his complex history, he did not return to be evaluated by them but they ran some additional tests including thyroid that was normal and the CBC listed below with normal Hgb but mildly low platelets. Cardiology asked him to monitor BP closely which he did and this did not show any abnormalities, he was asked to return here  - Today he discusses symptoms again, seem stable without significant worsening or new concerns. - Describes a "perceived fatigue or weakness", doesn't seem to limit his function but it is more of how he "feels" especially arms and legs. He states legs "don't feel normal" but not tingling or numb. Additionally has lightheadedness associated, will last hours or most of day and also rarely does have spells of dizziness lasting few seconds usually while sitting, no vertigo or room spinning. - He describes symptoms are episodic and not everyday, seems most days of the week, but he does have good days in the interval - He cannot associate any other factors or triggers, but he does seem to admit feeling best in the evening after dinner and resting.  - Also cardiology tried to approve sleep study, could not get approved, he did not have witnessed apnea or snoring - Denies vertigo or room spinning, fever chills, nausea  vomiting, vision changes  Thrombocytopenia, chronic / PMH - CAD, Severe Aortic Stenosis s/p AVR History of low platelet counts for years, by chart review has had some normal readings in 2016 and 2017, then had a mild low reading in 2017, and multiple readings low in 2018 and 2019, lowest were peri and post operatively in 06/2017 with as low as 70s. He had surgery for AVR at that time. He was not on anticoagulation due to this and acute blood loss anemia, has been managing primarily with cardiology on Aspirin 81mg  daily. - He reports that in the past he was advised that likely low platelets could be related to "clumping" and may need additional testing to confirm to see if they were normal or not. He has not seen Hematology - He had normal platelets up to 298 and normal Hemoglobin with resolved reading in 07/2017, then most recent test in 10/2017 per Cardiology again showed Hgb 15.6 and RDW 15.5 He had platelets of 129 only mild low. - He has not had any anemia panel or iron or ferritin or smear checked. - See above for other HPI Denies bleeding or bruising   Depression screen [facility] 2/9 11/13/2017 09/08/2017 07/27/2017  Decreased Interest 0 0 0  Down, Depressed, Hopeless 0 0 0  PHQ - 2 Score 0 0 0  Altered sleeping - - 1  Tired, decreased energy - - 0  Change in appetite - - 0  Feeling bad or failure about yourself  - - 0  Trouble concentrating - -  0  Moving slowly or fidgety/restless - - 0  Suicidal thoughts - - 0  PHQ-9 Score - - 1  Difficult doing work/chores - - Not difficult at all    Social History   Tobacco Use  . Smoking status: Former Smoker    Last attempt to quit: 1977    Years since quitting: 42.6  . Smokeless tobacco: Never Used  . Tobacco comment: quit in 78, teenager  Substance Use Topics  . Alcohol use: Yes    Alcohol/week: 1.0 standard drinks    Types: 1 Cans of beer per week    Comment: occasional  . Drug use: No    Review of Systems Per HPI unless specifically  indicated above     Objective:    BP 127/83   Pulse 76   Temp 97.8 F (36.6 C) (Oral)   Resp 16   Ht 5' 9.5" (1.765 m)   Wt 185 lb (83.9 kg)   BMI 26.93 kg/m   Wt Readings from Last 3 Encounters:  11/13/17 185 lb (83.9 kg)  09/18/17 179 lb (81.2 kg)  09/08/17 178 lb 12.8 oz (81.1 kg)    Physical Exam  Constitutional: He is oriented to person, place, and time. He appears well-developed and well-nourished. No distress.  Well-appearing, comfortable, cooperative  HENT:  Head: Normocephalic and atraumatic.  Mouth/Throat: Oropharynx is clear and moist.  Eyes: Conjunctivae are normal. Right eye exhibits no discharge. Left eye exhibits no discharge.  Neck: Normal range of motion. Neck supple. No thyromegaly present.  Cardiovascular: Normal rate, regular rhythm, normal heart sounds and intact distal pulses.  No murmur heard. Pulmonary/Chest: Effort normal and breath sounds normal. No respiratory distress. He has no wheezes. He has no rales.  Musculoskeletal: Normal range of motion. He exhibits no edema.  Back normal without deformity or abnormal curvature.  Upper / Lower Extremities: - Normal muscle tone, strength bilateral upper extremities 5/5, lower extremities 5/5 - Bilateral shoulders, knees, wrist, ankles without deformity, tenderness, effusion - Normal Gait  Lymphadenopathy:    He has no cervical adenopathy.  Neurological: He is alert and oriented to person, place, and time. No sensory deficit. He exhibits normal muscle tone. Coordination normal.  Skin: Skin is warm and dry. No rash noted. He is not diaphoretic. No erythema.  Psychiatric: He has a normal mood and affect. His behavior is normal.  Well groomed, good eye contact, normal speech and thoughts  Nursing note and vitals reviewed.  Results for orders placed or performed in visit on 28/78/67  Basic metabolic panel  Result Value Ref Range   Glucose 94    Creatinine 0.9 0.6 - 1.3  Lipid panel  Result Value Ref  Range   Triglycerides 69 40 - 160   Cholesterol 100 0 - 200   HDL 50 35 - 70   LDL Cholesterol 36   Hemoglobin A1c  Result Value Ref Range   Hemoglobin A1C 5.5   Measles/Mumps/Rubella Immunity  Result Value Ref Range   Rubella Antibodies, IGG 10.50    RUBEOLA AB, IGG >300    MUMPS ABS, IGG 66.4       Assessment & Plan:   Problem List Items Addressed This Visit    Thrombocytopenia (HCC) - Primary   Relevant Orders   Iron, TIBC and Ferritin Panel   Reticulocytes   Pathologist smear review    Other Visit Diagnoses    Chronic fatigue       Relevant Orders   Vitamin B12  VITAMIN D 25 Hydroxy (Vit-D Deficiency, Fractures)   Generalized weakness       Relevant Orders   Iron, TIBC and Ferritin Panel   Reticulocytes   Pathologist smear review   Vitamin B12   VITAMIN D 25 Hydroxy (Vit-D Deficiency, Fractures)   Episodic lightheadedness          Clinically his constellation of symptoms are non-specific and generalized, mostly with weakness type symptoms - No focal neurological deficits by history or exam - Not necessarily exertional and seems hemodynamically stable, cardiology believed less likely cardiac or related to his AVR surgery - Not consistent with vertigo - but seems linked to some lightheadedness still - Not consistent with sleepiness or sleep apnea by history - Possibly related but uncertain with more chronic thrombocytopenia without other cell line abnormalities  Plan - Discussion today - uncertain exact etiology, will warrant further testing and likely 2nd opinion - Will check anemia panel [facility] - with iron, ferritin, Tsat%, and check retic count and also peripheral smear review - if abnormalities detected would refer to Hematology for 2nd opinion and see if potentially hematologic problem could contribute to his generalized fatigue symptoms - If heme labs unremarkable, next step would be referral to Neurology - will send to [facility] for  2nd opinion given constellation of fatigue/weakness possibly neuromuscular and also with associated lightheaded / dizzy / headaches. Also possibly Neuro could get sleep study approved if they believed it was indicated  He agrees with further work-up, will notify with results or follow-up sooner if worsening or new concerns or symptoms  No orders of the defined types were placed in this encounter.   Follow up plan: Return in about 4 weeks (around 12/11/2017), or if symptoms worsen or fail to improve, for follow-up in 02/2018 as scheduled.  Given order req for [facility] today - to get labs early next week.  Additional future labs already in system for [facility] for 02/2018 - he will notify us before releasing these.  Nobie Putnam, [facility] 11/13/2017, 3:58 PM

## 2017-11-13 NOTE — Telephone Encounter (Signed)
Staff message sent to Gae Bon in lab sleep study has been denied by [employer]. Notify provider to switch to HST or can do peer to peer by calling [phone].

## 2017-11-13 NOTE — Telephone Encounter (Signed)
-----   Message from Freada Bergeron, [place] sent at 10/28/2017  4:21 PM EDT ----- Regarding: pre cert split night

## 2017-11-18 NOTE — Telephone Encounter (Signed)
  Lauralee Evener, CMA  Freada Bergeron, CMA        Patient's insurance denied in sleep study request please notify provider. Can do HST or peer to peer by calling [phone].    ----- Message -----  From: Freada Bergeron, CMA  Sent: 10/28/2017  4:21 PM EDT  To: Cv Div Sleep Studies  Subject: pre cert                     split night

## 2017-11-20 LAB — PATHOLOGIST SMEAR REVIEW
Basophils Absolute: 0.1 10*3/uL (ref 0.0–0.2)
Basos: 1 %
EOS (ABSOLUTE): 0.1 10*3/uL (ref 0.0–0.4)
Eos: 2 %
HEMATOCRIT: 48.2 % (ref 37.5–51.0)
HEMOGLOBIN: 15.9 g/dL (ref 13.0–17.7)
IMMATURE GRANS (ABS): 0 10*3/uL (ref 0.0–0.1)
IMMATURE GRANULOCYTES: 0 %
LYMPHS ABS: 1 10*3/uL (ref 0.7–3.1)
LYMPHS: 18 %
MCH: 29.3 pg (ref 26.6–33.0)
MCHC: 33 g/dL (ref 31.5–35.7)
MCV: 89 fL (ref 79–97)
MONOS ABS: 0.5 10*3/uL (ref 0.1–0.9)
Monocytes: 10 %
NEUTROS PCT: 69 %
Neutrophils Absolute: 3.9 10*3/uL (ref 1.4–7.0)
Path Rev RBC: NORMAL
Path Rev WBC: NORMAL
Platelets: 127 10*3/uL — ABNORMAL LOW (ref 150–450)
RBC: 5.42 x10E6/uL (ref 4.14–5.80)
RDW: 15.6 % — AB (ref 12.3–15.4)
WBC: 5.6 10*3/uL (ref 3.4–10.8)

## 2017-11-20 LAB — VITAMIN B12: Vitamin B-12: 324 pg/mL (ref 232–1245)

## 2017-11-20 LAB — RETICULOCYTES: Retic Ct Pct: 0.8 % (ref 0.6–2.6)

## 2017-11-20 LAB — IRON,TIBC AND FERRITIN PANEL
Ferritin: 60 ng/mL (ref 30–400)
IRON SATURATION: 37 % (ref 15–55)
Iron: 130 ug/dL (ref 38–169)
TIBC: 356 ug/dL (ref 250–450)
UIBC: 226 ug/dL (ref 111–343)

## 2017-11-20 LAB — VITAMIN D 25 HYDROXY (VIT D DEFICIENCY, FRACTURES): Vit D, 25-Hydroxy: 23.3 ng/mL — ABNORMAL LOW (ref 30.0–100.0)

## 2017-11-23 NOTE — Telephone Encounter (Signed)
Please make sure they now that he has significant CAD with severe fatigue and if they are not willing to approve then get home sleep study

## 2017-11-25 DIAGNOSIS — R5382 Chronic fatigue, unspecified: Secondary | ICD-10-CM

## 2017-11-25 DIAGNOSIS — R531 Weakness: Secondary | ICD-10-CM

## 2017-11-25 DIAGNOSIS — R42 Dizziness and giddiness: Secondary | ICD-10-CM

## 2017-12-01 NOTE — Telephone Encounter (Addendum)
  Todd Margarita, MD  Freada Bergeron, CMA        Order home sleep study      Home sleep test order placed and sent to [facility]

## 2017-12-01 NOTE — Telephone Encounter (Signed)
Insurance was aware of CAD and fatigue and in lab study was still denied. HST will need to be ordered and scheduled.

## 2017-12-01 NOTE — Addendum Note (Signed)
Addended by: Freada Bergeron on: 12/01/2017 01:54 PM   Modules accepted: Orders

## 2017-12-08 ENCOUNTER — Telehealth: Payer: Self-pay | Admitting: *Deleted

## 2017-12-08 NOTE — Telephone Encounter (Signed)
Faxed clinicals to [employer] for HST approval.

## 2017-12-14 ENCOUNTER — Telehealth: Payer: Self-pay | Admitting: *Deleted

## 2017-12-14 NOTE — Telephone Encounter (Signed)
Pre cert done with [employer] over the phone for HST.  They gave me a intake start date of 12/21/17. Which means they have until then to review the request and make a decision.

## 2017-12-14 NOTE — Telephone Encounter (Signed)
-----   Message from Freada Bergeron, [place] sent at 12/01/2017  1:57 PM EDT ----- Regarding: precert Home sleep test

## 2017-12-16 ENCOUNTER — Encounter: Payer: Self-pay | Admitting: Cardiothoracic Surgery

## 2017-12-16 ENCOUNTER — Ambulatory Visit: Payer: Managed Care, Other (non HMO) | Admitting: Cardiothoracic Surgery

## 2017-12-16 ENCOUNTER — Telehealth: Payer: Self-pay | Admitting: *Deleted

## 2017-12-16 VITALS — BP 126/83 | HR 71 | Resp 20 | Ht 69.5 in | Wt 189.0 lb

## 2017-12-16 DIAGNOSIS — Z951 Presence of aortocoronary bypass graft: Secondary | ICD-10-CM | POA: Diagnosis not present

## 2017-12-16 DIAGNOSIS — I2510 Atherosclerotic heart disease of native coronary artery without angina pectoris: Secondary | ICD-10-CM | POA: Diagnosis not present

## 2017-12-16 DIAGNOSIS — Z952 Presence of prosthetic heart valve: Secondary | ICD-10-CM

## 2017-12-16 DIAGNOSIS — I350 Nonrheumatic aortic (valve) stenosis: Secondary | ICD-10-CM

## 2017-12-16 NOTE — Progress Notes (Signed)
PCP is [place] Ranger, Devonne Doughty, DO Referring Provider is [name], MD  Chief Complaint  Patient presents with  . Routine Post Op    4 month f/u, HX of CABG/AVR    HPI: Patient returns for postop visit 6 months after aortic valve replacement with 23 mm biologic prosthesis and CABG x2.  Patient is doing well.  He has completed postop cardiac rehab.  He is currently enrolled in the maintenance cardiac rehab program and exercises daily.  He has no symptoms of chest pain shortness of breath edema or presyncope.  He has had some sinus congestion with some dizziness.  Sinus rhythm.  Blood pressure well controlled.  Patients  postop echo showed normal LV function with normal functioning of the AVR with a mean gradient of 11 mmHg.   Past Medical History:  Diagnosis Date  . Aortic stenosis    a. syncope/severe AS s/p bioprosthetic AVR 06/2017.  . Arthritis   . Bicuspid aortic valve   . Bradycardia 07/23/2015  . Broken clavicle    as child, can still dislocate at times  . CAD (coronary artery disease), native coronary artery    a. s/p 2V CABG at time of AVR 06/2017.  . Carotid bruit    carotid dopplers negative.  Bruit due to heart murmur from AS  . Congenital nevus   . Diastolic dysfunction   . Dilated aortic root ([place])    100mm by echo 02/2016  . GERD (gastroesophageal reflux disease)   . Headache   . Hyperlipidemia    LDL goal < 70  . LVH (left ventricular hypertrophy)   . Melanoma of skin ([address])   . Obesity   . PFO (patent foramen ovale)    small by echo 2014, but not demonstrated during intra-op note from CABG/AVR 06/2017.    Past Surgical History:  Procedure Laterality Date  . AORTIC VALVE REPLACEMENT N/A 06/25/2017   Procedure: AORTIC VALVE REPLACEMENT (AVR) using Magna Ease Aortic Valve size 11mm;  Surgeon: Ivin Poot, MD;  Location: [place];  Service: Open Heart Surgery;  Laterality: N/A;  . arthritic cyst removal  2017   from Lumbar 4-5, dr pool  . CARDIAC  CATHETERIZATION N/A 02/20/2016   Procedure: Right/Left Heart Cath and Coronary Angiography;  Surgeon: Belva Crome, MD;  Location: [place] [facility];  Service: Cardiovascular;  Laterality: N/A;  . CORONARY ARTERY BYPASS GRAFT N/A 06/25/2017   Procedure: CORONARY ARTERY BYPASS GRAFTING (CABG) x Two , using left internal mammary artery and right leg greater saphenous vein harvested endoscopically;  Surgeon: Ivin Poot, MD;  Location: [place];  Service: Open Heart Surgery;  Laterality: N/A;  . CORONARY/GRAFT ANGIOGRAPHY N/A 06/02/2017   Procedure: CORONARY/GRAFT ANGIOGRAPHY;  Surgeon: Nelva Bush, MD;  Location: [place] CV LAB;  Service: Cardiovascular;  Laterality: N/A;  . EYE SURGERY    . MELANOMA EXCISION  2011   left thigh  . RIGHT HEART CATH N/A 06/02/2017   Procedure: RIGHT HEART CATH;  Surgeon: Nelva Bush, MD;  Location: [place] CV LAB;  Service: Cardiovascular;  Laterality: N/A;  . TEE WITHOUT CARDIOVERSION N/A 06/25/2017   Procedure: TRANSESOPHAGEAL ECHOCARDIOGRAM (TEE);  Surgeon: Prescott Gum, Collier Salina, MD;  Location: [place];  Service: Open Heart Surgery;  Laterality: N/A;  . TONSILLECTOMY      Family History  Problem Relation Age of Onset  . Diabetes Mother   . Arthritis Father   . Heart disease Paternal Grandmother  MI  . Heart attack Unknown        family history  . Pancreatic cancer Maternal Aunt     Social History Social History   Tobacco Use  . Smoking status: Former Smoker    Last attempt to quit: 1977    Years since quitting: 42.7  . Smokeless tobacco: Never Used  . Tobacco comment: quit in 78, teenager  Substance Use Topics  . Alcohol use: Yes    Alcohol/week: 1.0 standard drinks    Types: 1 Cans of beer per week    Comment: occasional  . Drug use: No    Current Outpatient Medications  Medication Sig Dispense Refill  . aspirin EC 81 MG tablet Take 81 mg by mouth daily.    Marland Kitchen atorvastatin (LIPITOR) 80 MG tablet Take 1 tablet (80 mg  total) by mouth daily. 90 tablet 3  . Coenzyme Q10 (COQ-10) 200 MG CAPS Take 200 mg by mouth daily.    . fluticasone (FLONASE) 50 MCG/ACT nasal spray Place 2 sprays into both nostrils daily as needed (for allergies.). 48 g 3  . metoprolol tartrate (LOPRESSOR) 25 MG tablet Take 0.5 tablets (12.5 mg total) by mouth 2 (two) times daily. 60 tablet 6   No current facility-administered medications for this visit.     No Known Allergies  Review of Systems  No chest pain No shortness of breath No syncope No sternal click sensation No peripheral edema No bleeding problems Weight stable  BP 126/83   Pulse 71   Resp 20   Ht 5' 9.5" (1.765 m)   Wt 189 lb (85.7 kg)   SpO2 97% Comment: RA  BMI 27.51 kg/m  Physical Exam      Exam    General- alert and comfortable    Neck- no JVD, no cervical adenopathy palpable, no carotid bruit   Lungs- clear without rales, wheezes   Cor- regular rate and rhythm, no murmur , gallop   Abdomen- soft, non-tender   Extremities - warm, non-tender, minimal edema   Neuro- oriented, appropriate, no focal weakness   Diagnostic Tests: Early postop echo images personally reviewed showing good LV function and normal functioning aortic prosthesis  Impression: Patient doing well 6 months after AVR CABG.  No physical activity limitations at this point patient understands the importance of dental antibiotic prophylaxis- 2 g amoxicillin orally 1 hour before dental visit  Plan: Patient is being carefully followed by his cardiologist [name] and will have a echocardiogram performed later this year.  He will return here as needed.  Importance of heart healthy lifestyle and diet and indefinite intake 81 mg aspirin for aortic valve durability was reviewed with patient.  He will return as needed.  Len Childs, MD [facility] [phone]

## 2017-12-16 NOTE — Telephone Encounter (Signed)
Staff message sent to [facility] received from [place]. Ok to schedule HST. Auth # [id number] Valid dates 12/21/17 to 02/03/18.

## 2017-12-16 NOTE — Telephone Encounter (Signed)
-----   Message from Freada Bergeron, [place] sent at 12/01/2017  1:57 PM EDT ----- Regarding: precert Home sleep test

## 2017-12-18 ENCOUNTER — Encounter: Payer: Self-pay | Admitting: Family Medicine

## 2017-12-18 DIAGNOSIS — E559 Vitamin D deficiency, unspecified: Secondary | ICD-10-CM | POA: Insufficient documentation

## 2017-12-18 DIAGNOSIS — J3089 Other allergic rhinitis: Secondary | ICD-10-CM

## 2017-12-18 MED ORDER — AZELASTINE HCL 0.1 % NA SOLN: 1.0000 | Freq: Two times a day (BID) | NASAL | 2 refills | Status: DC

## 2017-12-22 NOTE — Telephone Encounter (Addendum)
Patient is aware and agreeable to Home Sleep Study through [facility]. Patient is scheduled for 01/06/18 at 12 pm to pick up home sleep kit and meet with Respiratory therapist at [facility]. Patient is aware that if this appointment date and time does not work for them they should contact Artis Delay directly at [phone]. Patient is aware that a sleep packet will be sent from [facility] in week. Patient is agreeable to treatment and thankful for call.

## 2018-01-04 NOTE — Progress Notes (Signed)
Received fax from [facility] that patient received Flu shot.   

## 2018-01-05 ENCOUNTER — Encounter: Payer: Self-pay | Admitting: Diagnostic Neuroimaging

## 2018-01-05 ENCOUNTER — Ambulatory Visit: Payer: Managed Care, Other (non HMO) | Admitting: Diagnostic Neuroimaging

## 2018-01-05 VITALS — BP 137/83 | HR 72 | Ht 69.5 in | Wt 188.0 lb

## 2018-01-05 DIAGNOSIS — R5383 Other fatigue: Secondary | ICD-10-CM | POA: Diagnosis not present

## 2018-01-05 DIAGNOSIS — R42 Dizziness and giddiness: Secondary | ICD-10-CM

## 2018-01-05 NOTE — Patient Instructions (Signed)
-   optimize nutrition, hydration  - monitor BP, sugar at home  - consider allergy testing  - follow up sleep study

## 2018-01-05 NOTE — Progress Notes (Signed)
[facility]  PATIENT: Todd Frank DOB: August 05, 1955  REFERRING CLINICIAN:  Olin Hauser, DO   HISTORY FROM: patient and wife  REASON FOR VISIT: new consult    HISTORICAL  CHIEF COMPLAINT:  Chief Complaint  Patient presents with  . Dizziness    rm 7, New Pt, wife- [name], "lightheadedness, dizziness, perceived weakness and fatigue for several years"     HISTORY OF PRESENT ILLNESS:   62 year old male here for evaluation of lightheadedness, dizziness, weakness.  Past 5 years patient has had intermittent attacks of "lightheadedness" where he feels a abnormal sensation in the head, eyes, head.  No room spinning sensation, nausea, vomiting, photophobia, phonophobia, numbness or tingling.  Sometimes he has a generalized perception of fatigue and weakness and malaise with these events.  Symptoms can last 1 hour all the time.  He is having 4-5 of these attacks per week.  Symptoms have been fairly consistent since the past 5 years.  No specific triggering or aggravating factors.  He has had extensive cardiac and ENT evaluations without specific cause found.  His blood pressure does not go up or down when he is having these attacks.  Patient has had aortic stenosis diagnosed several years ago, culminating in syncope attack in February 2019 requiring admission and surgery.  Following aortic valve replacement surgery his lightheaded episodes have continued.  Patient has been diagnosed with allergic rhinitis and prescribed Flonase nasal sprays, which patient reports seems to help his "lightheadedness" symptoms.    Patient stays very active with exercise and physical activity does not have any exertional symptoms.  Patient has a remote history of "optical migraines" he sees rainbow color phenomenon for a few minutes without headache associated.  No migraine headaches the past.   REVIEW OF SYSTEMS: Full 14 system review of systems performed and negative with  exception of: Dizziness fatigue ringing in ears.  ALLERGIES: No Known Allergies  HOME MEDICATIONS: Outpatient Medications Prior to Visit  Medication Sig Dispense Refill  . aspirin EC 81 MG tablet Take 81 mg by mouth daily.    Marland Kitchen atorvastatin (LIPITOR) 80 MG tablet Take 1 tablet (80 mg total) by mouth daily. 90 tablet 3  . azelastine (ASTELIN) 0.1 % nasal spray Place 1 spray into both nostrils 2 (two) times daily. Use in each nostril as directed 30 mL 2  . Coenzyme Q10 (COQ-10) 200 MG CAPS Take 200 mg by mouth daily.    . fluticasone (FLONASE) 50 MCG/ACT nasal spray Place 2 sprays into both nostrils daily as needed (for allergies.). 48 g 3  . metoprolol tartrate (LOPRESSOR) 25 MG tablet Take 0.5 tablets (12.5 mg total) by mouth 2 (two) times daily. 60 tablet 6   No facility-administered medications prior to visit.     PAST MEDICAL HISTORY: Past Medical History:  Diagnosis Date  . Aortic stenosis    a. syncope/severe AS s/p bioprosthetic AVR 06/2017.  . Arthritis   . Bicuspid aortic valve   . Bradycardia 07/23/2015  . Broken clavicle    as child, can still dislocate at times  . CAD (coronary artery disease), native coronary artery    a. s/p 2V CABG at time of AVR 06/2017.  . Carotid bruit    carotid dopplers negative.  Bruit due to heart murmur from AS  . Congenital nevus   . Diastolic dysfunction   . Dilated aortic root ([place])    5mm by echo 02/2016  . GERD (gastroesophageal reflux disease)   . Headache  optical migraine  . Hyperlipidemia    LDL goal < 70  . LVH (left ventricular hypertrophy)   . Melanoma of skin ([place])   . Obesity   . PFO (patent foramen ovale)    small by echo 2014, but not demonstrated during intra-op note from CABG/AVR 06/2017.    PAST SURGICAL HISTORY: Past Surgical History:  Procedure Laterality Date  . AORTIC VALVE REPLACEMENT N/A 06/25/2017   Procedure: AORTIC VALVE REPLACEMENT (AVR) using Magna Ease Aortic Valve size 42mm;  Surgeon: Ivin Poot, MD;  Location: [place];  Service: Open Heart Surgery;  Laterality: N/A;  . arthritic cyst removal  2017   from Lumbar 4-5, dr pool  . CARDIAC CATHETERIZATION N/A 02/20/2016   Procedure: Right/Left Heart Cath and Coronary Angiography;  Surgeon: Belva Crome, MD;  Location: [place] [facility];  Service: Cardiovascular;  Laterality: N/A;  . CORONARY ARTERY BYPASS GRAFT N/A 06/25/2017   Procedure: CORONARY ARTERY BYPASS GRAFTING (CABG) x Two , using left internal mammary artery and right leg greater saphenous vein harvested endoscopically;  Surgeon: Ivin Poot, MD;  Location: [place];  Service: Open Heart Surgery;  Laterality: N/A;  . CORONARY/GRAFT ANGIOGRAPHY N/A 06/02/2017   Procedure: CORONARY/GRAFT ANGIOGRAPHY;  Surgeon: Nelva Bush, MD;  Location: [place] [facility];  Service: Cardiovascular;  Laterality: N/A;  . EYE SURGERY    . MELANOMA EXCISION  2011   left thigh  . RIGHT HEART CATH N/A 06/02/2017   Procedure: RIGHT HEART CATH;  Surgeon: Nelva Bush, MD;  Location: [place] [facility];  Service: Cardiovascular;  Laterality: N/A;  . TEE WITHOUT CARDIOVERSION N/A 06/25/2017   Procedure: TRANSESOPHAGEAL ECHOCARDIOGRAM (TEE);  Surgeon: Prescott Gum, Collier Salina, MD;  Location: [place];  Service: Open Heart Surgery;  Laterality: N/A;  . TONSILLECTOMY      FAMILY HISTORY: Family History  Problem Relation Age of Onset  . Diabetes Mother   . Arthritis Father   . Arthritis Sister   . Heart disease Paternal Grandmother        MI  . Heart attack Unknown        family history  . Pancreatic cancer Maternal Aunt     SOCIAL HISTORY: Social History   Socioeconomic History  . Marital status: Married    Spouse name: [name]  . Number of children: 0  . Years of education: Assoc degree  . Highest education level: Not on file  Occupational History  . Occupation: Government social research officer - [employer]  . Financial resource strain: Not hard at all  . Food insecurity:    Worry: Never  true    Inability: Never true  . Transportation needs:    Medical: No    Non-medical: No  Tobacco Use  . Smoking status: Former Smoker    Packs/day: 1.00    Last attempt to quit: 04/07/1976    Years since quitting: 41.7  . Smokeless tobacco: Never Used  . Tobacco comment: quit in 78, teenager  Substance and Sexual Activity  . Alcohol use: Yes    Alcohol/week: 1.0 standard drinks    Types: 1 Cans of beer per week    Comment: occasional  . Drug use: No    Comment: quit marijuana in 1990  . Sexual activity: Yes  Lifestyle  . Physical activity:    Days per week: 7 days    Minutes per session: 80 min  . Stress: Not at all  Relationships  . Social connections:  Talks on phone: Three times a week    Gets together: More than three times a week    Attends religious service: Never    Active member of club or organization: No    Attends meetings of clubs or organizations: Never    Relationship status: Married  . Intimate partner violence:    Fear of current or ex partner: No    Emotionally abused: No    Physically abused: No    Forced sexual activity: No  Other Topics Concern  . Not on file  Social History Narrative   Lives with wife   Caffeine < 1 cup daily     PHYSICAL EXAM  GENERAL EXAM/CONSTITUTIONAL: Vitals:  Vitals:   01/05/18 1528  BP: 137/83  Pulse: 72  Weight: 188 lb (85.3 kg)  Height: 5' 9.5" (1.765 m)     Body mass index is 27.36 kg/m. Wt Readings from Last 3 Encounters:  01/05/18 188 lb (85.3 kg)  12/16/17 189 lb (85.7 kg)  11/13/17 185 lb (83.9 kg)     Patient is in no distress; well developed, nourished and groomed; neck is supple  CARDIOVASCULAR:  Examination of carotid arteries is normal; no carotid bruits  Regular rate and rhythm, no murmurs  Examination of peripheral vascular system by observation and palpation is normal  EYES:  Ophthalmoscopic exam of optic discs and posterior segments is normal; no papilledema or hemorrhages   Visual Acuity Screening   Right eye Left eye Both eyes  Without correction:     With correction:  20/30   Comments: 01/05/18 right eye has under developed retina    MUSCULOSKELETAL:  Gait, strength, tone, movements noted in Neurologic exam below  NEUROLOGIC: MENTAL STATUS:  No flowsheet data found.  awake, alert, oriented to person, place and time  recent and remote memory intact  normal attention and concentration  language fluent, comprehension intact, naming intact  fund of knowledge appropriate  CRANIAL NERVE:   2nd - no papilledema on fundoscopic exam  2nd, 3rd, 4th, 6th - pupils equal and reactive to light, visual fields full to confrontation, extraocular muscles intact, no nystagmus  5th - facial sensation symmetric  7th - facial strength symmetric  8th - hearing intact  9th - palate elevates symmetrically, uvula midline  11th - shoulder shrug symmetric  12th - tongue protrusion midline  MOTOR:   normal bulk and tone, full strength in the BUE, BLE  SENSORY:   normal and symmetric to light touch, pinprick, temperature, vibration  COORDINATION:   finger-nose-finger, fine finger movements normal  REFLEXES:   deep tendon reflexes present and symmetric  GAIT/STATION:   narrow based gait; able to walk on toes, heels and tandem; romberg is negative     DIAGNOSTIC DATA (LABS, IMAGING, TESTING) - I reviewed patient records, labs, notes, testing and imaging myself where available.  Lab Results  Component Value Date   WBC 5.6 11/16/2017   HGB 15.9 11/16/2017   HCT 48.2 11/16/2017   MCV 89 11/16/2017   PLT 127 (L) 11/16/2017      Component Value Date/Time   NA 139 07/15/2017 1523   K 5.3 (H) 07/15/2017 1523   CL 102 07/15/2017 1523   CO2 24 07/15/2017 1523   GLUCOSE 90 07/15/2017 1523   GLUCOSE 102 (H) 06/28/2017 0244   BUN 15 07/15/2017 1523   CREATININE 0.9 10/15/2017   CREATININE 0.81 07/15/2017 1523   CREATININE 0.84 02/15/2016  1517   CALCIUM 9.4 07/15/2017 1523  PROT 6.7 08/28/2017 1014   ALBUMIN 4.6 08/28/2017 1014   AST 27 08/28/2017 1014   ALT 26 08/28/2017 1014   ALKPHOS 103 08/28/2017 1014   BILITOT 0.9 08/28/2017 1014   GFRNONAA 95 07/15/2017 1523   GFRNONAA 94 11/02/2015   GFRAA 110 07/15/2017 1523   Lab Results  Component Value Date   CHOL 100 10/15/2017   HDL 50 10/15/2017   LDLCALC 36 10/15/2017   TRIG 69 10/15/2017   CHOLHDL 2.0 08/28/2017   Lab Results  Component Value Date   HGBA1C 5.5 10/15/2017   Lab Results  Component Value Date   VITAMINB12 324 11/16/2017   Lab Results  Component Value Date   TSH 1.226 05/31/2017    05/31/17 CT head [I reviewed images myself and agree with interpretation. -VRP]  - No acute intracranial abnormality. Soft tissue swelling over the right forehead.  07/17/17 TTE - Compared to prior echo an AV bioprosthesis is now present.  05/31/17 carotid u/s - Color duplex indicates minimal homogeneous plaque, with no hemodynamically significant stenosis by duplex criteria in the extracranial cerebrovascular circulation.   ASSESSMENT AND PLAN  62 y.o. year old male here with here with intermittent lightheadedness episodes associated with fatigue, malaise, subjective weakness, lasting hours at a time, several times per week, since 2014.  Neurologic examination is unremarkable today, in spite of patient having some subjective lightheadedness sensation today.  No evidence of underlying neuromuscular disorder to account for the symptoms.  Suspect symptoms could be related to other factors such as medical, cardiac, mood or sleep factors.  Migraine phenomenon is possible but less likely.   Ddx: intermittent lightheadedness / fatigue / malaise --> no evidence of neuromuscular disease; consider medical, cardiac, pulmonary or depression etiologies  1. Lightheadedness   2. Other fatigue      PLAN:  - optimize nutrition, hydration - monitor BP, sugar at home -  consider allergy testing - follow up sleep study  Return if symptoms worsen or fail to improve, for return to PCP.    Penni Bombard, MD 41/12/3788, 2:40 PM Certified in Neurology, Neurophysiology and Neuroimaging  [facility] [address] [place], [place] [address] [phone]

## 2018-01-05 NOTE — Progress Notes (Signed)
error 

## 2018-01-06 ENCOUNTER — Ambulatory Visit (HOSPITAL_BASED_OUTPATIENT_CLINIC_OR_DEPARTMENT_OTHER): Payer: Managed Care, Other (non HMO) | Attending: Cardiology | Admitting: Cardiology

## 2018-01-06 DIAGNOSIS — R5383 Other fatigue: Secondary | ICD-10-CM

## 2018-01-06 DIAGNOSIS — G4733 Obstructive sleep apnea (adult) (pediatric): Secondary | ICD-10-CM | POA: Diagnosis not present

## 2018-01-06 DIAGNOSIS — I10 Essential (primary) hypertension: Secondary | ICD-10-CM | POA: Diagnosis present

## 2018-01-11 NOTE — Procedures (Signed)
   Patient Name: Fleetwood, Pierron Date: 01/06/2018   Gender: Male  D.O.B: [birth year]  Age (years): 16  Referring Provider: [name] MD, ABSM  Height (inches): 69  Interpreting Physician: [name] MD, ABSM  Weight (lbs): 183  RPSGT: Jonna Coup  BMI: 27  MRN: [id number]  Neck Size: 15.50    CLINICAL INFORMATION  Sleep Study Type: HST Indication for sleep study: Fatigue Epworth Sleepiness Score: 7  SLEEP STUDY TECHNIQUE  A multi-channel overnight portable sleep study was performed. The channels recorded were: nasal airflow, thoracic respiratory movement, and oxygen saturation with a pulse oximetry. Snoring was also monitored.  MEDICATIONS  Patient self administered medications include: N/A.  SLEEP ARCHITECTURE  Patient was studied for 450 minutes. The sleep efficiency was 97.1 % and the patient was supine for 94.8%. The arousal index was 0.0 per hour.  RESPIRATORY PARAMETERS  The overall AHI was 6.5 per hour, with a central apnea index of 0.0 per hour. The oxygen nadir was 86% during sleep.  CARDIAC DATA  Mean heart rate during sleep was 51.2 bpm.  IMPRESSIONS  Mild obstructive sleep apnea occurred during this study (AHI = 6.5/h).  No significant central sleep apnea occurred during this study (CAI = 0.0/h).  Moderate oxygen desaturation was noted during this study (Min O2 = 86%).  Patient snored 1.4% during the sleep. DIAGNOSIS  Obstructive Sleep Apnea (327.23 [G47.33 ICD-10])  RECOMMENDATIONS  Therapeutic CPAP titration to determine optimal pressure required to alleviate sleep disordered breathing. Positional therapy avoiding supine position during sleep. -void alcohol, sedatives and other CNS depressants that may worsen sleep apnea and disrupt normal sleep architecture.  Sleep hygiene should be reviewed to assess factors that may improve sleep quality.  Weight management and regular exercise should be initiated or continued.  CPAP titration for sleep  disordered breathing.   [Electronically signed] 01/11/2018 08:40 AM [name] MD, ABSM  Diplomate, American Board of Sleep Medicine

## 2018-01-13 ENCOUNTER — Telehealth: Payer: Self-pay | Admitting: *Deleted

## 2018-01-13 DIAGNOSIS — G4733 Obstructive sleep apnea (adult) (pediatric): Secondary | ICD-10-CM

## 2018-01-13 NOTE — Telephone Encounter (Signed)
-----   Message from [name], MD sent at 01/11/2018  8:42 AM EDT ----- Please let patient know that they have sleep apnea and recommend CPAP titration. Please set up titration in the sleep lab.

## 2018-01-14 NOTE — Telephone Encounter (Signed)
Informed patient of titration results and verbalized understanding was indicated. Patient understands his sleep study showed they have sleep apnea and recommend CPAP titration. Pt is aware and agreeable to thiese results.

## 2018-01-18 ENCOUNTER — Telehealth: Payer: Self-pay | Admitting: *Deleted

## 2018-01-18 NOTE — Telephone Encounter (Signed)
-----   Message from Freada Bergeron, [place] sent at 01/14/2018  4:24 PM EDT ----- Regarding: pre cert  recommend CPAP titration

## 2018-01-18 NOTE — Telephone Encounter (Signed)
PA submitted to [facility] via web portal for CPAP titration study.

## 2018-01-19 ENCOUNTER — Ambulatory Visit: Payer: Self-pay | Admitting: Neurology

## 2018-02-08 ENCOUNTER — Telehealth: Payer: Self-pay | Admitting: *Deleted

## 2018-02-08 NOTE — Telephone Encounter (Signed)
-----   Message from Freada Bergeron, [place] sent at 01/14/2018  4:24 PM EDT ----- Regarding: pre cert  recommend CPAP titration

## 2018-02-08 NOTE — Telephone Encounter (Signed)
Staff message sent to [place] CPAP titration denied. Provider will need to do a peer to peer or order APAP.

## 2018-02-11 ENCOUNTER — Telehealth: Payer: Self-pay | Admitting: *Deleted

## 2018-02-11 DIAGNOSIS — G4733 Obstructive sleep apnea (adult) (pediatric): Secondary | ICD-10-CM

## 2018-02-11 NOTE — Telephone Encounter (Signed)
-----   Message from [name], MD sent at 02/11/2018  1:47 PM EST ----- Regarding: RE: APAP ORDERS Please order air since CPAP with heated humidity and mask of choice with 2-week auto titration from 5 to 18 cm H2O.  Get a download in 2 weeks.  He will need to follow-up with me in 10 weeks.  [name], MD ----- Message ----- From: Freada Bergeron, CMA Sent: 02/11/2018   1:21 PM EST To: [name], MD Subject: APAP ORDERS                                    Please write APAP orders if ok. ----- Message ----- From: Lauralee Evener, CMA Sent: 02/08/2018  11:21 AM EST To: Freada Bergeron, CMA Subject: RE: pre cert                                   Received a DENIAL from [place]. Will need to call for peer to peer or order APAP.  [phone]. Let me know if APAP needs to be approved. ----- Message ----- From: Freada Bergeron, CMA Sent: 01/14/2018   4:24 PM EST To: [facility] Sleep Studies Subject: pre cert                                        recommend CPAP titration

## 2018-02-11 NOTE — Telephone Encounter (Addendum)
Upon INSURANCE request DME selection is [facility]. Patient understands he will be contacted by [facility] to set up his cpap. Patient understands to call if [facility] does not contact him with new setup in a timely manner. Patient understands they will be called once confirmation has been received from [facility] that they have received their new machine to schedule 10 week follow up appointment.  [place] notified of new cpap order  Please add to airview Patient was grateful for the call and thanked me.

## 2018-02-12 ENCOUNTER — Encounter: Payer: Self-pay | Admitting: Family Medicine

## 2018-02-12 ENCOUNTER — Ambulatory Visit (INDEPENDENT_AMBULATORY_CARE_PROVIDER_SITE_OTHER): Payer: Managed Care, Other (non HMO) | Admitting: Family Medicine

## 2018-02-12 VITALS — BP 120/78 | HR 79 | Temp 98.7°F | Resp 16 | Ht 69.5 in | Wt 190.0 lb

## 2018-02-12 DIAGNOSIS — I10 Essential (primary) hypertension: Secondary | ICD-10-CM

## 2018-02-12 DIAGNOSIS — Z0000 Encounter for general adult medical examination without abnormal findings: Secondary | ICD-10-CM | POA: Diagnosis not present

## 2018-02-12 DIAGNOSIS — I77810 Thoracic aortic ectasia: Secondary | ICD-10-CM

## 2018-02-12 DIAGNOSIS — D696 Thrombocytopenia, unspecified: Secondary | ICD-10-CM

## 2018-02-12 DIAGNOSIS — Z125 Encounter for screening for malignant neoplasm of prostate: Secondary | ICD-10-CM

## 2018-02-12 DIAGNOSIS — I2510 Atherosclerotic heart disease of native coronary artery without angina pectoris: Secondary | ICD-10-CM

## 2018-02-12 DIAGNOSIS — E7800 Pure hypercholesterolemia, unspecified: Secondary | ICD-10-CM

## 2018-02-12 DIAGNOSIS — G4733 Obstructive sleep apnea (adult) (pediatric): Secondary | ICD-10-CM

## 2018-02-12 NOTE — Assessment & Plan Note (Signed)
Controlled cholesterol on statin and lifestyle Last lipid panel 10/2017. At goal LDL < 70 CAD  Plan: 1. Continue current meds - Atorvastatin 80mg  daily 2. Continue ASA 81mg  for secondary ASCVD risk reduction 3. Encourage improved lifestyle - low carb/cholesterol, reduce portion size, continue improving regular exercise 4. Follow-up w/ cardiology, yearly lipids

## 2018-02-12 NOTE — Assessment & Plan Note (Signed)
Stable Followed by Cardiology/Cardiothoracic surgery S/p CABG AVR in 2019

## 2018-02-12 NOTE — Patient Instructions (Addendum)
Thank you for coming to the office today.  Continue with nose sprays as you are. Okay to take long-term.  May return to ENT in future.  Labs printed for [facility]  Please schedule a Follow-up Appointment to: Return in about 1 year (around 02/13/2019) for Annual Physical.  If you have any other questions or concerns, please feel free to call the office or send a message through [place]. You may also schedule an earlier appointment if necessary.  Additionally, you may be receiving a survey about your experience at our office within a few days to 1 week by e-mail or mail. We value your feedback.  Nobie Putnam, DO [place]

## 2018-02-12 NOTE — Assessment & Plan Note (Signed)
Stable without angina Followed by [facility] Cardiology Dr [place]ford Pax / Cardiothoracic Surgery S/p CABG AVR Continues on medication management - ASA 81, BB, Statin

## 2018-02-12 NOTE — Assessment & Plan Note (Signed)
Recent labs stable 11/2017 Plt 127 Peripheral smear w/o concerning finding, likely clumping Will follow-up

## 2018-02-12 NOTE — Assessment & Plan Note (Signed)
Clinically with [facility], on recent Home PSG testing Due for Home CPAP titration next

## 2018-02-12 NOTE — Assessment & Plan Note (Signed)
Avg / low risk patient No clinical concern Prior PSA negative, last 2018 Will re-check, print [facility] order

## 2018-02-12 NOTE — Progress Notes (Signed)
Subjective:    Patient ID: Todd Frank, male    DOB: 10/29/1955, 62 y.o.   MRN: [id number]  Todd Frank is a 62 y.o. male presenting on 02/12/2018 for Annual Exam   HPI   Here for Annual Physical and Lab Review.  Lifestyle / BMI >27 - Previously wt up to 260 lbs (1990s), he has done well to maintain stable wt after loss - Weight stable since last visit - He continues balanced diet - Exercise: Regular gym workout at [facility] [employer] [facility] x 6 days weekly in AM, and Sunday walk with wife 1-1.5 hours  Thrombocytopenia, chronic Last lab showed mild low 127 plt and peripheral smear was giant platelet and clumping. No new concerns. Denies bleeding or bruising  CHRONIC HTN: Reports no recent concerns, remains well controlled, checks BP regularly at home. Current Meds - Metoprolol tartrate 12.5mg  BID (half tab 25)  CAD, Severe Aortic Stenosis s/p AVR - Followed by [facility] (Dr [name]) / Cardiothoracic Surgery Dr Prescott Gum S/p 8 months after AVR CABG. He has done well. Completed cardiac rehab. - Last ECHO 07/2017, will continue to follow - On ASA 81mg  daily  HYPERLIPIDEMIA: - Followed by Cardiology, has been on high dose lipitor with Atorvastatin 80mg  daily, goal LDL < 70 - Last lipids done 10/2017  History of Dermatology [facility], Dr Nehemiah Massed Prior malignant melanoma removed. AKs frozen in past, return to [facility]  Additional follow-up concerns:  Follow-up Dizziness/Lightheadedness / Seasonal Allergies / Sinusitis - Last visit with me 11/2017 and other recent visits for same problem, see prior notes for background information. - He has seen [facility] Dr Richardson Landry twice, other ENT in [place] - Most recent update has seen [facility] - they were unable to determine a cause of his symptoms, determined less likely neurological - Interval update with most improvement from Flonase and Azelastine sprays daily, only partial relief, using regularly - Today patient  reports still has some frontal sinus pressure and lightheadedness dizziness, feels off balance, best description is "cold air in ear" and feels "off". He has no symptom at this time. - He continues on nose sprays, Flonase and Azeslastine - Asking about what to do next - Also of note he is awaiting sleep study CPAP titration, see below  OSA Study approved for Home Sleep Study on 01/06/18, diagnosis of OSA, they denied sleep lab CPAP titration. Dr Radford Pax has approved sleep study with [facility] and now will get home CPAP titration setup, pending processing now maybe 2-3 weeks to have test done. Sleep study report in chart - Has symptoms of active tired and sleepy  Health Maintenance: UTD 1st dose Shingrix pharmacy, 2nd dose in November 2019  - Colon CA Screening: UTD - last screen w/ cologuard 03/22/17 (negative) next due in 3 years 03/2020 - Last Colonoscopy 11/15/16 (done by [facility]), results with reported normal next due 10 years, 11/2016. Currently asymptomatic. No known family history of colon CA  - UTD Flu vaccine 01/02/18  - UTD routine Hep C and HIV screen  - UTD TDap  - Prostate CA Screening: Last prostate CA screening PSA 0.9 (02/2017), prior DRE reported normal. Currently asymptomatic. No known family history of prostate CA. Due for screening PSA - order printed for [facility] given to patient   Depression screen [facility] 2/9 02/12/2018 11/13/2017 09/08/2017  Decreased Interest 0 0 0  Down, Depressed, Hopeless 0 0 0  PHQ - 2 Score 0 0 0  Altered sleeping - - -  Tired, decreased energy - - -  Change in appetite - - -  Feeling bad or failure about yourself  - - -  Trouble concentrating - - -  Moving slowly or fidgety/restless - - -  Suicidal thoughts - - -  PHQ-9 Score - - -  Difficult doing work/chores - - -    Past Medical History:  Diagnosis Date  . Aortic stenosis    a. syncope/severe AS s/p bioprosthetic AVR 06/2017.  . Arthritis   . Bicuspid aortic valve   . Bradycardia 07/23/2015    . Broken clavicle    as child, can still dislocate at times  . CAD (coronary artery disease), native coronary artery    a. s/p 2V CABG at time of AVR 06/2017.  . Carotid bruit    carotid dopplers negative.  Bruit due to heart murmur from AS  . Congenital nevus   . Diastolic dysfunction   . Dilated aortic root ([place])    48mm by echo 02/2016  . GERD (gastroesophageal reflux disease)   . Headache    optical migraine  . Hyperlipidemia    LDL goal < 70  . LVH (left ventricular hypertrophy)   . Melanoma of skin ([place])   . Obesity   . PFO (patent foramen ovale)    small by echo 2014, but not demonstrated during intra-op note from CABG/AVR 06/2017.   Past Surgical History:  Procedure Laterality Date  . AORTIC VALVE REPLACEMENT N/A 06/25/2017   Procedure: AORTIC VALVE REPLACEMENT (AVR) using Magna Ease Aortic Valve size 78mm;  Surgeon: Ivin Poot, MD;  Location: [place];  Service: Open Heart Surgery;  Laterality: N/A;  . arthritic cyst removal  2017   from Lumbar 4-5, dr pool  . CARDIAC CATHETERIZATION N/A 02/20/2016   Procedure: Right/Left Heart Cath and Coronary Angiography;  Surgeon: Belva Crome, MD;  Location: [place] [facility];  Service: Cardiovascular;  Laterality: N/A;  . CORONARY ARTERY BYPASS GRAFT N/A 06/25/2017   Procedure: CORONARY ARTERY BYPASS GRAFTING (CABG) x Two , using left internal mammary artery and right leg greater saphenous vein harvested endoscopically;  Surgeon: Ivin Poot, MD;  Location: [place];  Service: Open Heart Surgery;  Laterality: N/A;  . CORONARY/GRAFT ANGIOGRAPHY N/A 06/02/2017   Procedure: CORONARY/GRAFT ANGIOGRAPHY;  Surgeon: Nelva Bush, MD;  Location: [place] [facility];  Service: Cardiovascular;  Laterality: N/A;  . EYE SURGERY    . MELANOMA EXCISION  2011   left thigh  . RIGHT HEART CATH N/A 06/02/2017   Procedure: RIGHT HEART CATH;  Surgeon: Nelva Bush, MD;  Location: [place] [facility];  Service: Cardiovascular;  Laterality:  N/A;  . TEE WITHOUT CARDIOVERSION N/A 06/25/2017   Procedure: TRANSESOPHAGEAL ECHOCARDIOGRAM (TEE);  Surgeon: Prescott Gum, Collier Salina, MD;  Location: [place];  Service: Open Heart Surgery;  Laterality: N/A;  . TONSILLECTOMY     Social History   Socioeconomic History  . Marital status: Married    Spouse name: [name]  . Number of children: 0  . Years of education: Assoc degree  . Highest education level: Not on file  Occupational History  . Occupation: Government social research officer - [place]  . Financial resource strain: Not hard at all  . Food insecurity:    Worry: Never true    Inability: Never true  . Transportation needs:    Medical: No    Non-medical: No  Tobacco Use  . Smoking status: Former Smoker    Packs/day: 1.00  Last attempt to quit: 04/07/1976    Years since quitting: 41.8  . Smokeless tobacco: Former Systems developer  . Tobacco comment: quit in 78, teenager  Substance and Sexual Activity  . Alcohol use: Yes    Alcohol/week: 1.0 standard drinks    Types: 1 Cans of beer per week    Comment: occasional  . Drug use: No    Comment: quit marijuana in 1990  . Sexual activity: Yes  Lifestyle  . Physical activity:    Days per week: 7 days    Minutes per session: 80 min  . Stress: Not at all  Relationships  . Social connections:    Talks on phone: Three times a week    Gets together: More than three times a week    Attends religious service: Never    Active member of club or organization: No    Attends meetings of clubs or organizations: Never    Relationship status: Married  . Intimate partner violence:    Fear of current or ex partner: No    Emotionally abused: No    Physically abused: No    Forced sexual activity: No  Other Topics Concern  . Not on file  Social History Narrative   Lives with wife   Caffeine < 1 cup daily   Family History  Problem Relation Age of Onset  . Diabetes Mother   . Arthritis Father   . Arthritis Sister   . Heart disease Paternal Grandmother          MI  . Heart attack Unknown        family history  . Pancreatic cancer Maternal Aunt    Current Outpatient Medications on File Prior to Visit  Medication Sig  . aspirin EC 81 MG tablet Take 81 mg by mouth daily.  Marland Kitchen atorvastatin (LIPITOR) 80 MG tablet Take 1 tablet (80 mg total) by mouth daily.  Marland Kitchen azelastine (ASTELIN) 0.1 % nasal spray Place 1 spray into both nostrils 2 (two) times daily. Use in each nostril as directed  . Coenzyme Q10 (COQ-10) 200 MG CAPS Take 200 mg by mouth daily.  . fluticasone (FLONASE) 50 MCG/ACT nasal spray Place 2 sprays into both nostrils daily as needed (for allergies.).  Marland Kitchen metoprolol tartrate (LOPRESSOR) 25 MG tablet Take 0.5 tablets (12.5 mg total) by mouth 2 (two) times daily.   No current facility-administered medications on file prior to visit.     Review of Systems  Constitutional: Negative for activity change, appetite change, chills, diaphoresis, fatigue and fever.  HENT: Negative for congestion and hearing loss.   Eyes: Negative for visual disturbance.  Respiratory: Negative for apnea, cough, choking, chest tightness, shortness of breath and wheezing.   Cardiovascular: Negative for chest pain, palpitations and leg swelling.  Gastrointestinal: Negative for abdominal pain, anal bleeding, blood in stool, constipation, diarrhea, nausea and vomiting.  Endocrine: Negative for cold intolerance.  Genitourinary: Negative for decreased urine volume, dysuria, frequency, hematuria, testicular pain and urgency.  Musculoskeletal: Negative for arthralgias, back pain and neck pain.  Skin: Negative for rash.  Allergic/Immunologic: Positive for environmental allergies.  Neurological: Positive for dizziness (none currently) and light-headedness (none currently). Negative for weakness, numbness and headaches.  Hematological: Negative for adenopathy.  Psychiatric/Behavioral: Negative for behavioral problems, dysphoric mood and sleep disturbance. The patient is not  nervous/anxious.    Per HPI unless specifically indicated above      Objective:    BP 120/78   Pulse 79   Temp 98.7 F (  37.1 C) (Oral)   Resp 16   Ht 5' 9.5" (1.765 m)   Wt 190 lb (86.2 kg)   BMI 27.66 kg/m   Wt Readings from Last 3 Encounters:  02/12/18 190 lb (86.2 kg)  01/05/18 188 lb (85.3 kg)  12/16/17 189 lb (85.7 kg)    Physical Exam  Constitutional: He is oriented to person, place, and time. He appears well-developed and well-nourished. No distress.  Well-appearing, comfortable, cooperative  HENT:  Head: Normocephalic and atraumatic.  Mouth/Throat: Oropharynx is clear and moist.  Frontal / maxillary sinuses non-tender. Nares patent without purulence or edema. Bilateral TMs clear without erythema, effusion or bulging. Oropharynx clear without erythema, exudates, edema or asymmetry.  Eyes: Pupils are equal, round, and reactive to light. Conjunctivae and EOM are normal. Right eye exhibits no discharge. Left eye exhibits no discharge.  Neck: Normal range of motion. Neck supple. No thyromegaly present.  Carotid bruit bilateral, radiating from aortic valve, unchanged  Cardiovascular: Normal rate, regular rhythm and intact distal pulses.  Murmur (2/6 systolic murmur over left sternal border / aortic) heard. Pulmonary/Chest: Effort normal and breath sounds normal. No respiratory distress. He has no wheezes. He has no rales.  Abdominal: Soft. Bowel sounds are normal. He exhibits no distension and no mass. There is no tenderness.  Musculoskeletal: Normal range of motion. He exhibits no edema or tenderness.  Upper / Lower Extremities: - Normal muscle tone, strength bilateral upper extremities 5/5, lower extremities 5/5  Lymphadenopathy:    He has no cervical adenopathy.  Neurological: He is alert and oriented to person, place, and time.  Distal sensation intact to light touch all extremities  Skin: Skin is warm and dry. No rash noted. He is not diaphoretic. No erythema.    Psychiatric: He has a normal mood and affect. His behavior is normal.  Well groomed, good eye contact, normal speech and thoughts  Nursing note and vitals reviewed.  Results for orders placed or performed in visit on 11/13/17  Pathologist smear review  Result Value Ref Range   Path Rev WBC Appear normal.    Path Rev RBC Appear normal.    Path Rev PLTs Comment    PATHOLOGIST NAME Comment    WBC 5.6 3.4 - 10.8 x10E3/uL   RBC 5.42 4.14 - 5.80 x10E6/uL   Hemoglobin 15.9 13.0 - 17.7 g/dL   Hematocrit 48.2 37.5 - 51.0 %   MCV 89 79 - 97 fL   MCH 29.3 26.6 - 33.0 pg   MCHC 33.0 31.5 - 35.7 g/dL   RDW 15.6 (H) 12.3 - 15.4 %   Platelets 127 (L) 150 - 450 x10E3/uL   Neutrophils 69 Not Estab. %   Lymphs 18 Not Estab. %   Monocytes 10 Not Estab. %   Eos 2 Not Estab. %   Basos 1 Not Estab. %   Neutrophils Absolute 3.9 1.4 - 7.0 x10E3/uL   Lymphocytes Absolute 1.0 0.7 - 3.1 x10E3/uL   Monocytes Absolute 0.5 0.1 - 0.9 x10E3/uL   EOS (ABSOLUTE) 0.1 0.0 - 0.4 x10E3/uL   Basophils Absolute 0.1 0.0 - 0.2 x10E3/uL   Immature Granulocytes 0 Not Estab. %   Immature Grans (Abs) 0.0 0.0 - 0.1 x10E3/uL  Iron, TIBC and Ferritin Panel  Result Value Ref Range   Total Iron Binding Capacity 356 250 - 450 ug/dL   UIBC 226 111 - 343 ug/dL   Iron 130 38 - 169 ug/dL   Iron Saturation 37 15 - 55 %  Ferritin 60 30 - 400 ng/mL  VITAMIN D 25 Hydroxy (Vit-D Deficiency, Fractures)  Result Value Ref Range   Vit D, 25-Hydroxy 23.3 (L) 30.0 - 100.0 ng/mL  Vitamin B12  Result Value Ref Range   Vitamin B-12 324 232 - 1,245 pg/mL  Reticulocytes  Result Value Ref Range   Retic Ct Pct 0.8 0.6 - 2.6 %      Assessment & Plan:   Problem List Items Addressed This Visit    CAD (coronary artery disease), native coronary artery    Stable without angina Followed by [facility] Dr Radford Pax / Cardiothoracic Surgery S/p CABG AVR Continues on medication management - ASA 81, BB, Statin      Dilated aortic root  (HCC)    Stable Followed by Cardiology/Cardiothoracic surgery S/p CABG AVR in 2019      Essential hypertension    Well-controlled HTN - Home BP readings controlled  Complication CAD, AS s/p AVR CABG Followed by Cardiology  Plan:  1. Continue Metoprolol 12.5mg  BID (half of 25mg ) 2. Encourage improved lifestyle - low sodium diet, regular exercise 3. Continue monitor BP outside office, bring readings to next visit, if persistently >140/90 or new symptoms notify office sooner 4. Follow-up 1 year for annual      Hypercholesteremia    Controlled cholesterol on statin and lifestyle Last lipid panel 10/2017. At goal LDL < 70 CAD  Plan: 1. Continue current meds - Atorvastatin 80mg  daily 2. Continue ASA 81mg  for secondary ASCVD risk reduction 3. Encourage improved lifestyle - low carb/cholesterol, reduce portion size, continue improving regular exercise 4. Follow-up w/ cardiology, yearly lipids       OSA (obstructive sleep apnea)    Clinically with OSA, on recent Home PSG testing Due for Home CPAP titration next      Prostate cancer screening    Avg / low risk patient No clinical concern Prior PSA negative, last 2018 Will re-check, print [facility] order      Thrombocytopenia ([place])    Recent labs stable 11/2017 Plt 127 Peripheral smear w/o concerning finding, likely clumping Will follow-up       Other Visit Diagnoses    Annual physical exam    -  Primary      Updated Health Maintenance information Reviewed recent lab results with patient Encouraged improvement to lifestyle with diet and exercise  #Episodic Dizziness/Lightheadedness Reassuring, now negative cardiac, ENT and now neurological work-up Improved only on Azelastine nasal spray, suspect some component of inner ear or chronic sinus component  Plan Agree with pursuing sleep study now, suspect maybe contributing factor, awaiting home CPAP titration next, already ordered per Cardiology Follow-up sooner if  needed, limited other options for him for testing and evaluation at this point, he may return to ENT whenever needed  No orders of the defined types were placed in this encounter.   Follow up plan: Return in about 1 year (around 02/13/2019) for Annual Physical.  Future labs to be determined. He will get labs through [facility], will notify us for orders when ready or at next apt.  Nobie Putnam, DO [facility] 02/12/2018, 1:25 PM

## 2018-02-12 NOTE — Assessment & Plan Note (Signed)
Well-controlled HTN - Home BP readings controlled  Complication CAD, AS s/p AVR CABG Followed by Cardiology  Plan:  1. Continue Metoprolol 12.5mg  BID (half of 25mg ) 2. Encourage improved lifestyle - low sodium diet, regular exercise 3. Continue monitor BP outside office, bring readings to next visit, if persistently >140/90 or new symptoms notify office sooner 4. Follow-up 1 year for annual

## 2018-02-13 LAB — COMPREHENSIVE METABOLIC PANEL
A/G RATIO: 2 (ref 1.2–2.2)
ALK PHOS: 112 IU/L (ref 39–117)
ALT: 34 IU/L (ref 0–44)
AST: 36 IU/L (ref 0–40)
Albumin: 4.4 g/dL (ref 3.6–4.8)
BILIRUBIN TOTAL: 0.7 mg/dL (ref 0.0–1.2)
BUN/Creatinine Ratio: 16 (ref 10–24)
BUN: 14 mg/dL (ref 8–27)
CO2: 24 mmol/L (ref 20–29)
Calcium: 9.4 mg/dL (ref 8.6–10.2)
Chloride: 102 mmol/L (ref 96–106)
Creatinine, Ser: 0.87 mg/dL (ref 0.76–1.27)
GFR calc non Af Amer: 92 mL/min/{1.73_m2} (ref >59)
GFR, EST AFRICAN AMERICAN: 106 mL/min/{1.73_m2} (ref >59)
GLUCOSE: 71 mg/dL (ref 65–99)
Globulin, Total: 2.2 g/dL (ref 1.5–4.5)
POTASSIUM: 4.2 mmol/L (ref 3.5–5.2)
Sodium: 141 mmol/L (ref 134–144)
Total Protein: 6.6 g/dL (ref 6.0–8.5)

## 2018-02-13 LAB — LIPID PANEL
CHOLESTEROL TOTAL: 100 mg/dL (ref 100–199)
Chol/HDL Ratio: 2.2 ratio (ref 0.0–5.0)
HDL: 45 mg/dL (ref >39)
LDL CALC: 34 mg/dL (ref 0–99)
TRIGLYCERIDES: 105 mg/dL (ref 0–149)
VLDL CHOLESTEROL CAL: 21 mg/dL (ref 5–40)

## 2018-02-13 LAB — PSA: PROSTATE SPECIFIC AG, SERUM: 1.1 ng/mL (ref 0.0–4.0)

## 2018-02-15 ENCOUNTER — Telehealth: Payer: Self-pay | Admitting: *Deleted

## 2018-02-15 DIAGNOSIS — G4733 Obstructive sleep apnea (adult) (pediatric): Secondary | ICD-10-CM

## 2018-02-15 NOTE — Telephone Encounter (Signed)
-----   Message from Freada Bergeron, [place] sent at 02/11/2018  4:55 PM EST ----- Regarding: FW: APAP ORDERS   ----- Message ----- From: [name], MD Sent: 02/11/2018   1:47 PM EST To: Freada Bergeron, CMA Subject: RE: APAP ORDERS                                Please order air since CPAP with heated humidity and mask of choice with 2-week auto titration from 5 to 18 cm H2O.  Get a download in 2 weeks.  He will need to follow-up with me in 10 weeks.  [name], MD ----- Message ----- From: Freada Bergeron, CMA Sent: 02/11/2018   1:21 PM EST To: [name], MD Subject: APAP ORDERS                                    Please write APAP orders if ok. ----- Message ----- From: Lauralee Evener, CMA Sent: 02/08/2018  11:21 AM EST To: Freada Bergeron, CMA Subject: RE: pre cert                                   Received a DENIAL from [place]. Will need to call for peer to peer or order APAP.  [phone]. Let me know if APAP needs to be approved. ----- Message ----- From: Freada Bergeron, CMA Sent: 01/14/2018   4:24 PM EST To: [facility] Sleep Studies Subject: pre cert                                        recommend CPAP titration

## 2018-02-15 NOTE — Telephone Encounter (Signed)
Staff message sent to [facility] with APAP Auth information. Authorization # [id number] valid dates 02/15/18 tom 06/14/18. Order has to go to [place].

## 2018-02-15 NOTE — Addendum Note (Signed)
Addended by: Freada Bergeron on: 02/15/2018 01:55 PM   Modules accepted: Orders

## 2018-02-15 NOTE — Telephone Encounter (Addendum)
Order for  Please order air since CPAP with heated humidity and mask of choice with 2-week auto titration from 5 to 18 cm H2O. Get a download in 2 weeks. He will need to follow-up with me in 10 weeks.  faxed to [place] at [phone].

## 2018-03-09 ENCOUNTER — Encounter: Payer: Self-pay | Admitting: Cardiology

## 2018-03-09 ENCOUNTER — Encounter: Payer: Managed Care, Other (non HMO) | Admitting: Cardiology

## 2018-03-10 NOTE — Progress Notes (Signed)
This encounter was created in error - please disregard.

## 2018-04-14 NOTE — Telephone Encounter (Signed)
Patient has a 10 week follow up appointment scheduled for 1/20 2020. Patient understands she needs to keep this appointment for insurance compliance. Patient was grateful for the call and thanked me.

## 2018-04-26 ENCOUNTER — Ambulatory Visit: Payer: BC Managed Care – PPO | Admitting: Cardiology

## 2018-04-26 ENCOUNTER — Encounter: Payer: Self-pay | Admitting: Cardiology

## 2018-04-26 ENCOUNTER — Telehealth: Payer: Self-pay | Admitting: *Deleted

## 2018-04-26 VITALS — BP 130/80 | HR 74 | Ht 69.5 in | Wt 192.4 lb

## 2018-04-26 DIAGNOSIS — T50905A Adverse effect of unspecified drugs, medicaments and biological substances, initial encounter: Secondary | ICD-10-CM

## 2018-04-26 DIAGNOSIS — R001 Bradycardia, unspecified: Secondary | ICD-10-CM

## 2018-04-26 DIAGNOSIS — G4733 Obstructive sleep apnea (adult) (pediatric): Secondary | ICD-10-CM

## 2018-04-26 DIAGNOSIS — I10 Essential (primary) hypertension: Secondary | ICD-10-CM | POA: Diagnosis not present

## 2018-04-26 MED ORDER — ATORVASTATIN CALCIUM 80 MG PO TABS: 80.0000 mg | ORAL_TABLET | Freq: Every day | ORAL | 3 refills | Status: DC

## 2018-04-26 NOTE — Telephone Encounter (Signed)
-----   Message from Sarina Ill, RN sent at 04/26/2018  4:40 PM EST ----- Regarding: Sleep Hello, [name] ordered a change in CPAP to Auto 5-10 cm H2O and get a download in 2 weeks. Orders placed, please send. Thanks, [employer]

## 2018-04-26 NOTE — Progress Notes (Addendum)
Cardiology Office Note:    Date:  04/27/2018   ID:  Todd Frank, Todd Frank [birth year], MRN [id number]  PCP:  Olin Hauser, DO  Cardiologist:  [name], MD    Referring MD: Nobie Putnam *   No chief complaint on file.   History of Present Illness:    Todd Frank is a 63 y.o. male with a hx of hypertension, bicuspid aortic valve, CAD status post CABG who was referred for home sleep study to excessive daytime fatigue.  Epworth sleepiness score was 7.  He was found to have mild obstructive sleep apnea with an AHI of 6.5/h and no significant central sleep apnea.  He was placed on a home auto CPAP titration from 5 to 18 cm H2O and is now here for follow-up.he  He is doing well with his CPAP device and thinks that he has gotten used to it.  He tolerates the mask but at times feels the pressure is too high and too hard to exhale against.  Since going on CPAP he feels rested in the am and has no significant daytime sleepiness but still has problems with feeling fatigued.  Marland Kitchen  He denies any significant mouth or nasal dryness or nasal congestion.  He does not think that he snores.  He has checked his HR at times on a phone app when he feels very fatigued and brought in a tracing today showing sinus bradycardia at 48bpm.  He says that he will feel exhausted when his HR gets low.    Past Medical History:  Diagnosis Date  . Aortic stenosis    a. syncope/severe AS s/p bioprosthetic AVR 06/2017.  . Arthritis   . Bicuspid aortic valve   . Bradycardia    secondary to BB  . Broken clavicle    as child, can still dislocate at times  . CAD (coronary artery disease), native coronary artery    a. s/p 2V CABG at time of AVR 06/2017.  . Carotid bruit    carotid dopplers negative.  Bruit due to heart murmur from AS  . Congenital nevus   . Diastolic dysfunction   . Dilated aortic root ([place])    3mm by echo 02/2016  . GERD (gastroesophageal reflux disease)   . Headache    optical  migraine  . Hyperlipidemia    LDL goal < 70  . LVH (left ventricular hypertrophy)   . Melanoma of skin ([place])   . Obesity   . PFO (patent foramen ovale)    small by echo 2014, but not demonstrated during intra-op note from CABG/AVR 06/2017.    Past Surgical History:  Procedure Laterality Date  . AORTIC VALVE REPLACEMENT N/A 06/25/2017   Procedure: AORTIC VALVE REPLACEMENT (AVR) using Magna Ease Aortic Valve size 29mm;  Surgeon: Ivin Poot, MD;  Location: [place];  Service: Open Heart Surgery;  Laterality: N/A;  . arthritic cyst removal  2017   from Lumbar 4-5, dr pool  . CARDIAC CATHETERIZATION N/A 02/20/2016   Procedure: Right/Left Heart Cath and Coronary Angiography;  Surgeon: Belva Crome, MD;  Location: [place];  Service: Cardiovascular;  Laterality: N/A;  . CORONARY ARTERY BYPASS GRAFT N/A 06/25/2017   Procedure: CORONARY ARTERY BYPASS GRAFTING (CABG) x Two , using left internal mammary artery and right leg greater saphenous vein harvested endoscopically;  Surgeon: Ivin Poot, MD;  Location: [place];  Service: Open Heart Surgery;  Laterality: N/A;  . CORONARY/GRAFT ANGIOGRAPHY N/A 06/02/2017  Procedure: CORONARY/GRAFT ANGIOGRAPHY;  Surgeon: Nelva Bush, MD;  Location: [place] [facility];  Service: Cardiovascular;  Laterality: N/A;  . EYE SURGERY    . MELANOMA EXCISION  2011   left thigh  . RIGHT HEART CATH N/A 06/02/2017   Procedure: RIGHT HEART CATH;  Surgeon: Nelva Bush, MD;  Location: [place] [facility];  Service: Cardiovascular;  Laterality: N/A;  . TEE WITHOUT CARDIOVERSION N/A 06/25/2017   Procedure: TRANSESOPHAGEAL ECHOCARDIOGRAM (TEE);  Surgeon: Prescott Gum, Collier Salina, MD;  Location: [place];  Service: Open Heart Surgery;  Laterality: N/A;  . TONSILLECTOMY      Current Medications: Current Meds  Medication Sig  . aspirin EC 81 MG tablet Take 81 mg by mouth daily.  Marland Kitchen atorvastatin (LIPITOR) 80 MG tablet Take 1 tablet (80 mg total) by mouth daily.    Marland Kitchen azelastine (ASTELIN) 0.1 % nasal spray Place 1 spray into both nostrils 2 (two) times daily. Use in each nostril as directed  . cholecalciferol (VITAMIN D) 1000 units tablet Take 1,000 Units by mouth 2 (two) times daily.  . Coenzyme Q10 (COQ-10) 200 MG CAPS Take 200 mg by mouth daily.  . fluticasone (FLONASE) 50 MCG/ACT nasal spray Place 2 sprays into both nostrils daily as needed (for allergies.).  . [DISCONTINUED] atorvastatin (LIPITOR) 80 MG tablet Take 1 tablet (80 mg total) by mouth daily.  . [DISCONTINUED] metoprolol tartrate (LOPRESSOR) 25 MG tablet Take 0.5 tablets (12.5 mg total) by mouth 2 (two) times daily.     Allergies:   Patient has no known allergies.   Social History   Socioeconomic History  . Marital status: Married    Spouse name: [name]  . Number of children: 0  . Years of education: Assoc degree  . Highest education level: Not on file  Occupational History  . Occupation: Government social research officer - [employer]  . Financial resource strain: Not hard at all  . Food insecurity:    Worry: Never true    Inability: Never true  . Transportation needs:    Medical: No    Non-medical: No  Tobacco Use  . Smoking status: Former Smoker    Packs/day: 1.00    Last attempt to quit: 04/07/1976    Years since quitting: 42.0  . Smokeless tobacco: Former Systems developer  . Tobacco comment: quit in 78, teenager  Substance and Sexual Activity  . Alcohol use: Yes    Alcohol/week: 1.0 standard drinks    Types: 1 Cans of beer per week    Comment: occasional  . Drug use: No    Comment: quit marijuana in 1990  . Sexual activity: Yes  Lifestyle  . Physical activity:    Days per week: 7 days    Minutes per session: 80 min  . Stress: Not at all  Relationships  . Social connections:    Talks on phone: Three times a week    Gets together: More than three times a week    Attends religious service: Never    Active member of club or organization: No    Attends meetings of clubs or  organizations: Never    Relationship status: Married  Other Topics Concern  . Not on file  Social History Narrative   Lives with wife   Caffeine < 1 cup daily     Family History: The patient's family history includes Arthritis in his father and sister; Diabetes in his mother; Heart attack in his unknown relative; Heart disease in his paternal grandmother; Pancreatic  cancer in his maternal aunt.  ROS:   Please see the history of present illness.    ROS  All other systems reviewed and negative.   EKGs/Labs/Other Studies Reviewed:    The following studies were reviewed today: PAP download  EKG:  EKG is not ordered today  Recent Labs: 05/31/2017: TSH 1.226 06/26/2017: Magnesium 2.3 11/16/2017: Hemoglobin 15.9; Platelets 127 02/12/2018: ALT 34; BUN 14; Creatinine, Ser 0.87; Potassium 4.2; Sodium 141   Recent Lipid Panel    Component Value Date/Time   CHOL 100 02/12/2018 1333   CHOL 173 11/02/2015   TRIG 105 02/12/2018 1333   TRIG 69 11/02/2015   HDL 45 02/12/2018 1333   CHOLHDL 2.2 02/12/2018 1333   CHOLHDL 3.1 11/02/2015   VLDL 14 11/02/2015   LDLCALC 34 02/12/2018 1333   LDLCALC 104 (A) 11/02/2015    Physical Exam:    VS:  BP 130/80   Pulse 74   Ht 5' 9.5" (1.765 m)   Wt 192 lb 6.4 oz (87.3 kg)   SpO2 98%   BMI 28.01 kg/m     Wt Readings from Last 3 Encounters:  04/26/18 192 lb 6.4 oz (87.3 kg)  03/09/18 191 lb 6.4 oz (86.8 kg)  02/12/18 190 lb (86.2 kg)     GEN:  Well nourished, well developed in no acute distress HEENT: Normal NECK: No JVD; No carotid bruits LYMPHATICS: No lymphadenopathy CARDIAC: RRR, no murmurs, rubs, gallops RESPIRATORY:  Clear to auscultation without rales, wheezing or rhonchi  ABDOMEN: Soft, non-tender, non-distended MUSCULOSKELETAL:  No edema; No deformity  SKIN: Warm and dry NEUROLOGIC:  Alert and oriented x 3 PSYCHIATRIC:  Normal affect   ASSESSMENT:    1. OSA (obstructive sleep apnea)   2. Essential hypertension   3.  Bradycardia, drug induced    PLAN:    In order of problems listed above:  1.  OSA - the patient is tolerating PAP therapy well without any problems. The PAP download was reviewed today and showed an AHI of 1.2/hr on auto PAP with 97% compliance in using more than 4 hours nightly.  The patient has been using and benefiting from PAP use and will continue to benefit from therapy.He is complaining that at times he feels he is getting too much pressure and it is hard to breath out against.  Therefore I have recommended changing to auto PAP from 5 10 10cm H2O and get a download in 2 weeks.  He will call me if he does not like the new pressure settings.   2.  HTN -BP is well controlled on exam today. Since he is feeling very fatigued on BB and BP is well controlled I have instructed him to stop the BB and instructed him to watch his BP.   3.  Symptomatic bradycardia - this is related to Lopressor use for his HTN and CAD.  He has documented HRs in the 40's when he feels bad so I have recommended that he stop his lopressor which is at the lowest dose already and let me know if his symptoms of fatigue persist and if HR dose not improve.mm  Medication Adjustments/Labs and Tests Ordered: Current medicines are reviewed at length with the patient today.  Concerns regarding medicines are outlined above.  Orders Placed This Encounter  Procedures  . CPAP   Meds ordered this encounter  Medications  . atorvastatin (LIPITOR) 80 MG tablet    Sig: Take 1 tablet (80 mg total) by mouth daily.  Dispense:  90 tablet    Refill:  3    Signed, [name], MD  04/27/2018 6:09 PM    [place]

## 2018-04-26 NOTE — Patient Instructions (Signed)
Medication Instructions:  Stop: Lopressor  If you need a refill on your cardiac medications before your next appointment, please call your pharmacy.   Lab work: None If you have labs (blood work) drawn today and your tests are completely normal, you will receive your results only by: Marland Kitchen MyChart Message (if you have MyChart) OR . A paper copy in the mail If you have any lab test that is abnormal or we need to change your treatment, we will call you to review the results.  Testing/Procedures: None  Follow-Up: Your physician recommends that you schedule a follow-up appointment in: 4 months with [name]

## 2018-04-27 ENCOUNTER — Encounter: Payer: Self-pay | Admitting: Cardiology

## 2018-04-27 DIAGNOSIS — R001 Bradycardia, unspecified: Secondary | ICD-10-CM | POA: Insufficient documentation

## 2018-04-27 DIAGNOSIS — T50905A Adverse effect of unspecified drugs, medicaments and biological substances, initial encounter: Secondary | ICD-10-CM

## 2018-06-17 IMAGING — MR MR LUMBAR SPINE W/O CM
5 series · 37 of 48 positions shown · non-contrast
Comparison: Radiography 11/28/2013

CLINICAL DATA: Low back pain over the last 2 years radiating to the
right leg. Some pain on the left is well.

EXAM:
MRI LUMBAR SPINE WITHOUT CONTRAST
TECHNIQUE: Multiplanar, multisequence MR imaging of the lumbar spine was
performed. No intravenous contrast was administered.

[Series 2: T2 · sagittal · 4.0mm · 0.81mm/px · 6 of 15 slices shown (1 of 2)]
[im 1/15]
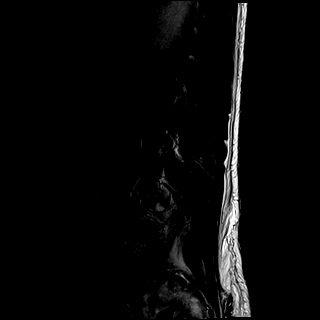
[im 3/15]
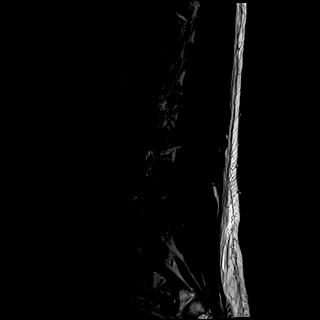
[im 6/15]
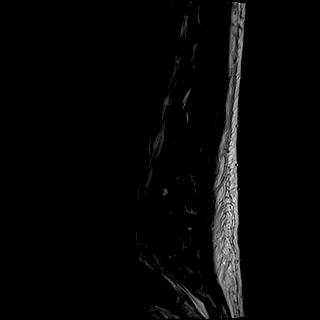
[im 9/15]
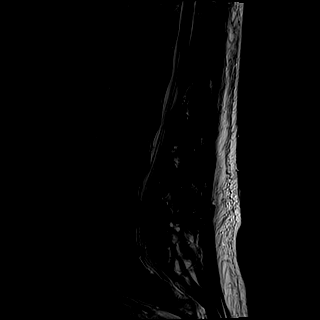
[im 12/15]
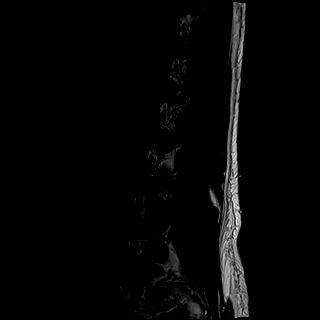
[im 15/15]
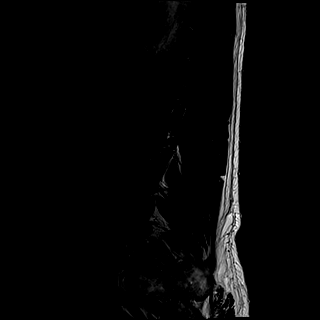

[Series 3: T1 · sagittal · 4.0mm · 0.81mm/px · 6 of 15 slices shown (1 of 2)]
[im 1/15]
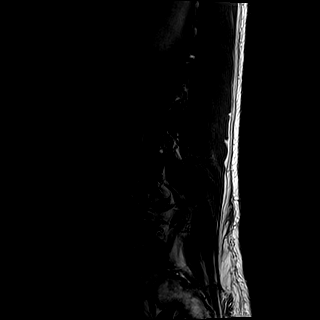
[im 3/15]
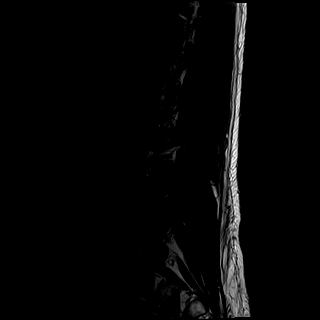
[im 6/15]
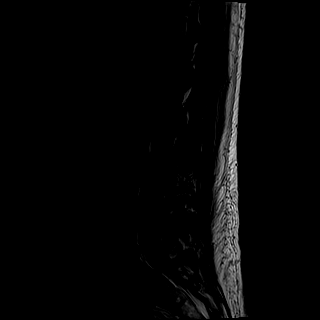
[im 9/15]
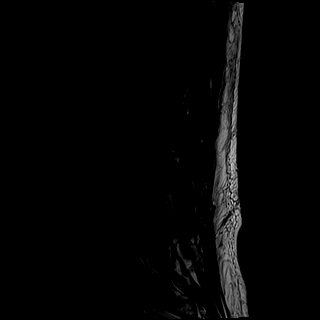
[im 12/15]
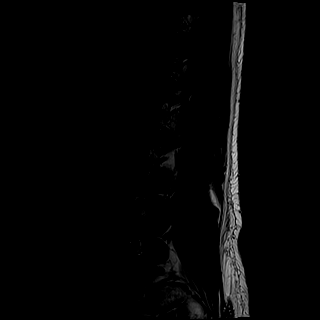
[im 15/15]
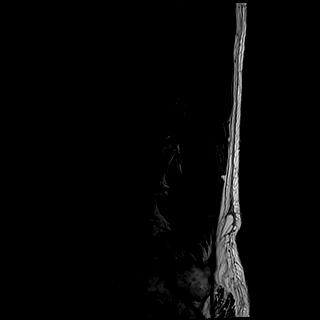

[Series 4: STIR · sagittal · 4.0mm · 1.02mm/px · 6 of 15 slices shown]
[im 1/15]
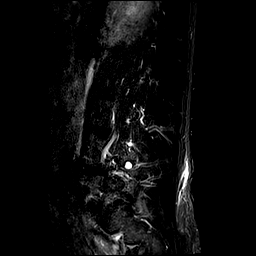
[im 3/15]
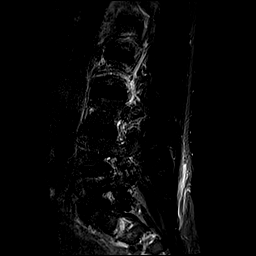
[im 6/15]
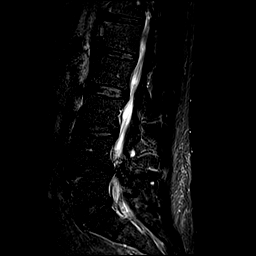
[im 9/15]
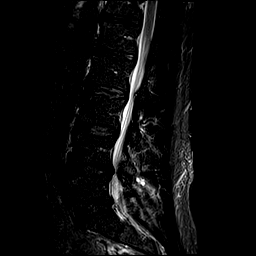
[im 12/15]
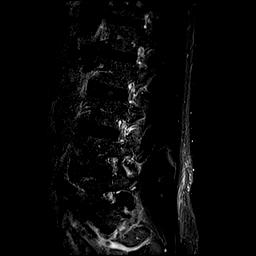
[im 15/15]
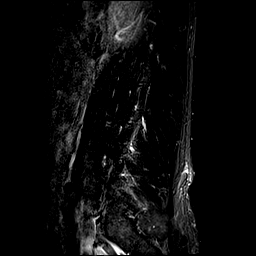

[Series 5: T2 · axial · 4.0mm · 0.78mm/px · z∈[-103,+130]mm · 10 of 40 slices shown (2 of 2)]
[im 1/40]
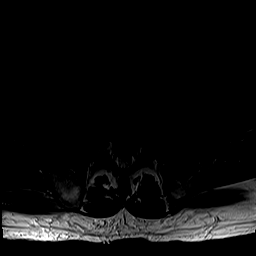
[im 3/40]
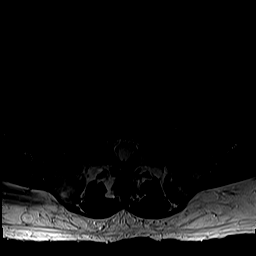
[im 6/40]
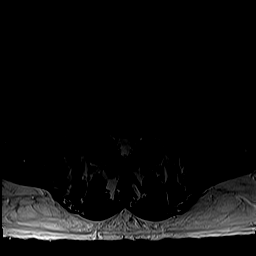
[im 12/40]
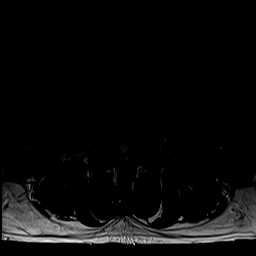
[im 17/40]
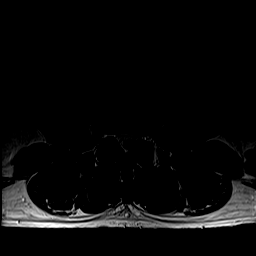
[im 20/40]
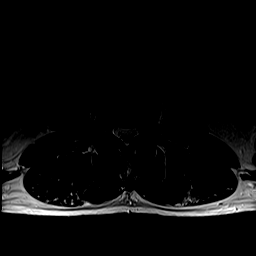
[im 23/40]
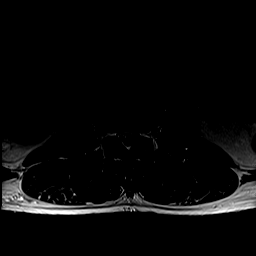
[im 28/40]
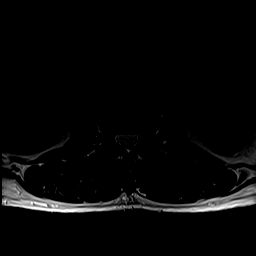
[im 34/40]
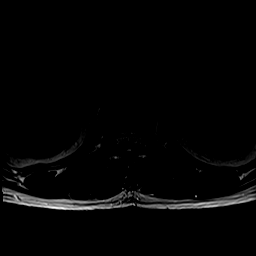
[im 40/40]
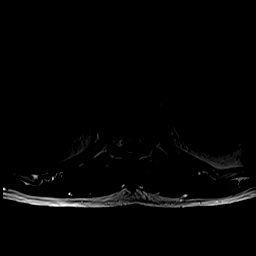

[Series 6: T1 · axial · 4.0mm · 0.39mm/px · z∈[-103,+130]mm · 9 of 40 slices shown (2 of 2)]
[im 1/40]
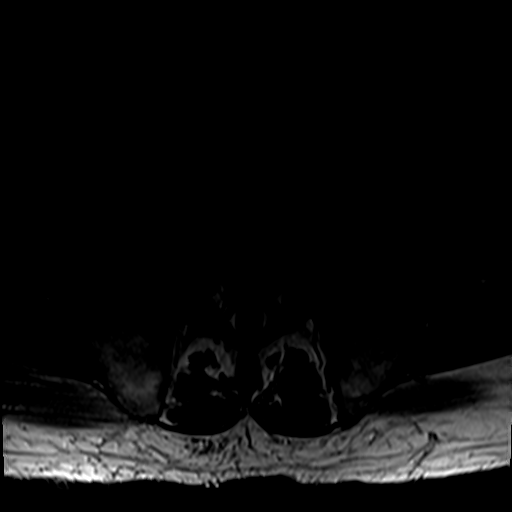
[im 6/40]
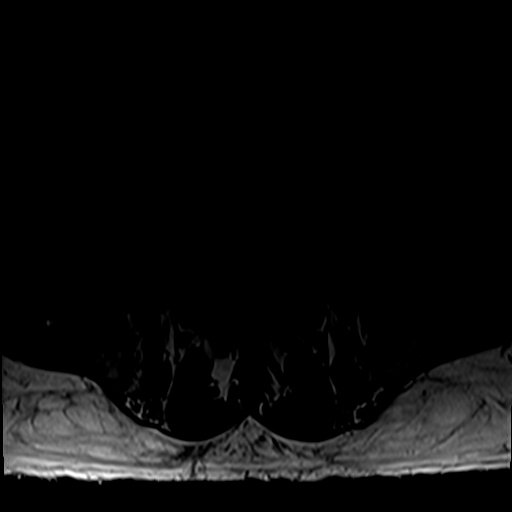
[im 12/40]
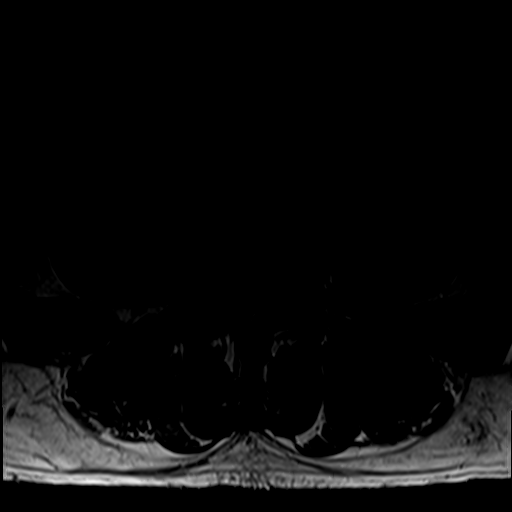
[im 17/40]
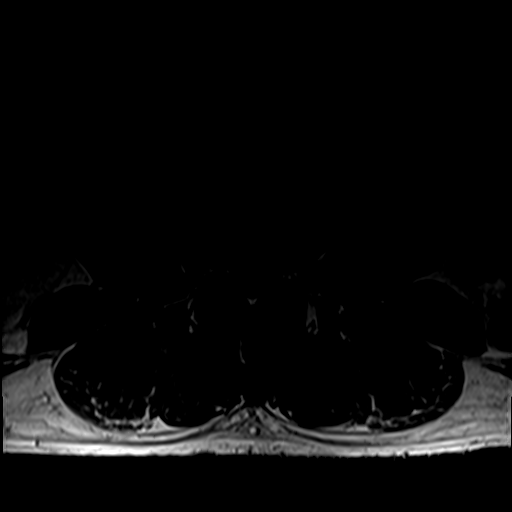
[im 20/40]
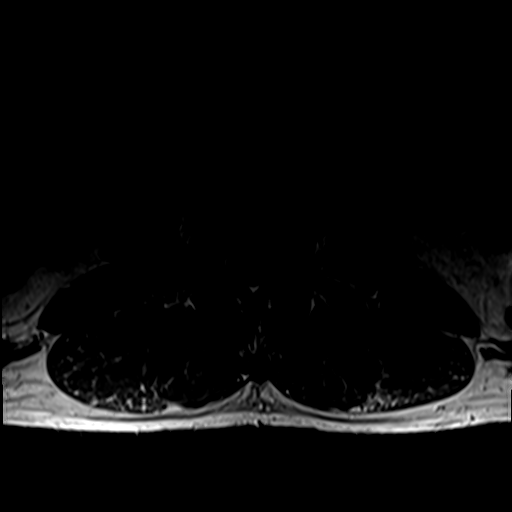
[im 23/40]
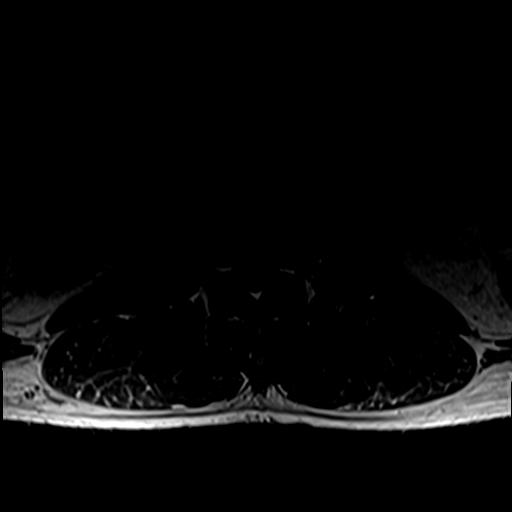
[im 28/40]
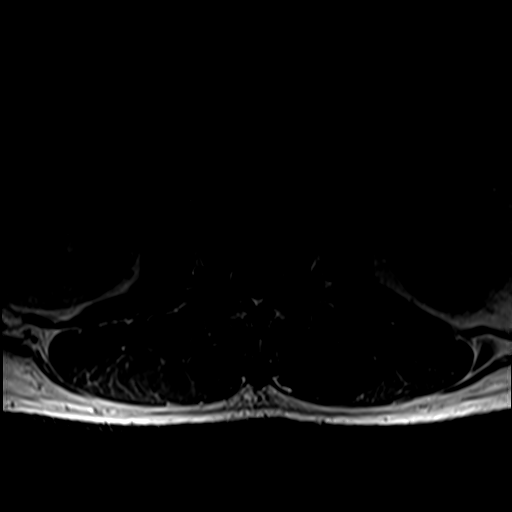
[im 34/40]
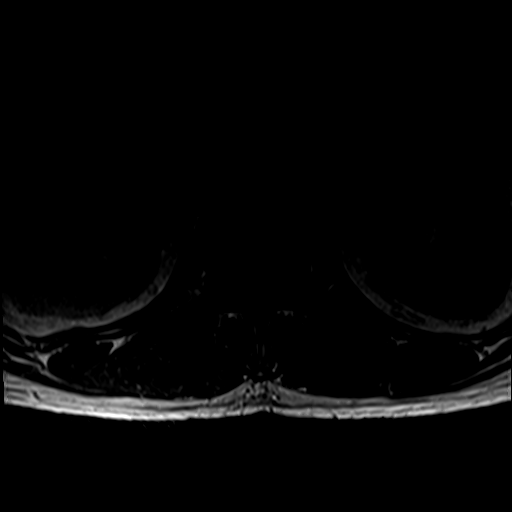
[im 40/40]
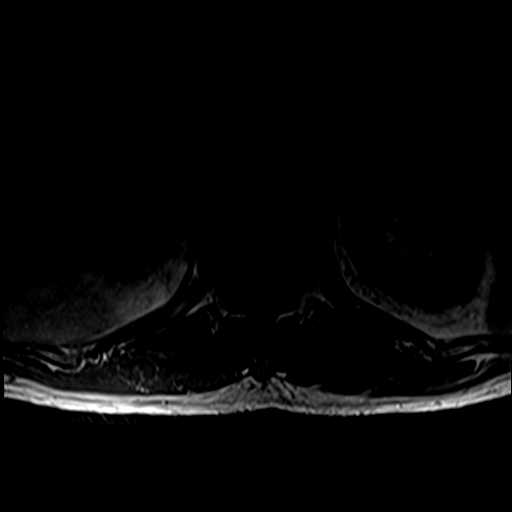

[37 of 48 positions shown; findings below may reference images not displayed]

FINDINGS: Segmentation:  5 lumbar type vertebral bodies.

Alignment:  Normal

Vertebrae:  No fracture or primary bone lesion.

Conus medullaris: Extends to the L1-2 level and appears normal.

Paraspinal and other soft tissues: No significant finding.

Disc levels:

L3-4 and above are unremarkable. Minimal disc bulges. No stenosis or
neural compression. No significant facet arthropathy.

L4-5: Bilateral facet arthropathy with a 1 cm synovial cyst
projecting inward from the facet on the right causing stenosis of
the right-sided canal. Left side of the canal is also stenotic,
though not as severely so. The disc is degenerated and bulges
diffusely.

L5-S1: Minimal bulging of the disc. Mild facet arthritis. No
stenosis.
IMPRESSION: The significant findings are at the L4-5 level. The disc is
degenerated and bulges mildly. There is advanced bilateral facet
arthropathy, including a 1 cm synovial cyst projecting inward from
the facet on the right. There is spinal stenosis at this level, most
severe in the lateral recesses, right much worse than left because
of the synovial cyst. Neural compression seems virtually certain on
the right, and could also be occurring on the left.

## 2018-06-21 ENCOUNTER — Telehealth: Payer: Self-pay | Admitting: *Deleted

## 2018-06-21 NOTE — Telephone Encounter (Signed)
Informed patient of compliance results and patient understanding was verbalized. Patient is aware and agreeable to AHI being within range at 1.3. Patient is aware and agreeable to being in compliance with machine usage. Patient is aware and agreeable to no change in current pressures.  Patient states things are well with his machine and he has no complaints at this time.

## 2018-06-21 NOTE — Telephone Encounter (Signed)
-----   Message from [name], MD sent at 06/20/2018 11:43 PM EDT ----- Good AHI and compliance.  Continue current PAP settings.  Please find out how he is doing on auto PAP

## 2018-06-29 ENCOUNTER — Other Ambulatory Visit: Payer: Self-pay

## 2018-06-29 DIAGNOSIS — J3089 Other allergic rhinitis: Secondary | ICD-10-CM

## 2018-06-29 MED ORDER — FLUTICASONE PROPIONATE 50 MCG/ACT NA SUSP: 2.0000 | Freq: Every day | NASAL | 3 refills | Status: DC | PRN

## 2018-08-20 ENCOUNTER — Telehealth: Payer: Self-pay | Admitting: Cardiology

## 2018-08-20 NOTE — Telephone Encounter (Signed)
Virtual Visit Pre-Appointment Phone Call  "(Name), I am calling you today to discuss your upcoming appointment. We are currently trying to limit exposure to the virus that causes COVID-19 by seeing patients at home rather than in the office."  1. "What is the BEST phone number to call the day of the visit?" - include this in appointment notes  2. Do you have or have access to (through a family member/friend) a smartphone with video capability that we can use for your visit?" a. If yes - list this number in appt notes as cell (if different from BEST phone #) and list the appointment type as a VIDEO visit in appointment notes b. If no - list the appointment type as a PHONE visit in appointment notes  3. Confirm consent - "In the setting of the current Covid19 crisis, you are scheduled for a (phone or video) visit with your provider on (date) at (time).  Just as we do with many in-office visits, in order for you to participate in this visit, we must obtain consent.  If you'd like, I can send this to your mychart (if signed up) or email for you to review.  Otherwise, I can obtain your verbal consent now.  All virtual visits are billed to your insurance company just like a normal visit would be.  By agreeing to a virtual visit, we'd like you to understand that the technology does not allow for your provider to perform an examination, and thus may limit your provider's ability to fully assess your condition. If your provider identifies any concerns that need to be evaluated in person, we will make arrangements to do so.  Finally, though the technology is pretty good, we cannot assure that it will always work on either your or our end, and in the setting of a video visit, we may have to convert it to a phone-only visit.  In either situation, we cannot ensure that we have a secure connection.  Are you willing to proceed?" STAFF: Did the patient verbally acknowledge consent to telehealth visit? Document  YES/NO here: [email or web address] [phone]/verbal consent 08/20/18/  4. Advise patient to be prepared - "Two hours prior to your appointment, go ahead and check your blood pressure, pulse, oxygen saturation, and your weight (if you have the equipment to check those) and write them all down. When your visit starts, your provider will ask you for this information. If you have an Apple Watch or Kardia device, please plan to have heart rate information ready on the day of your appointment. Please have a pen and paper handy nearby the day of the visit as well."  5. Give patient instructions for MyChart download to smartphone OR [email or web address] as below if video visit (depending on what platform provider is using)  6. Inform patient they will receive a phone call 15 minutes prior to their appointment time (may be from unknown caller ID) so they should be prepared to answer    TELEPHONE CALL NOTE  Todd Frank has been deemed a candidate for a follow-up tele-health visit to limit community exposure during the Covid-19 pandemic. I spoke with the patient via phone to ensure availability of phone/video source, confirm preferred email & phone number, and discuss instructions and expectations.  I reminded Todd Frank to be prepared with any vital sign and/or heart rhythm information that could potentially be obtained via home monitoring, at the time of his visit. I reminded Todd Frank to expect a phone  call prior to his visit.  Armando Gang 08/20/2018 1:48 PM   INSTRUCTIONS FOR DOWNLOADING THE MYCHART APP TO SMARTPHONE  - The patient must first make sure to have activated MyChart and know their login information - If Apple, go to [employer] and type in MyChart in the search bar and download the app. If Android, ask patient to go to [employer] and type in [place] in the search bar and download the app. The app is free but as with any other app downloads, their phone may require  them to verify saved payment information or Apple/Android password.  - The patient will need to then log into the app with their MyChart username and password, and select [facility] as their healthcare provider to link the account. When it is time for your visit, go to the MyChart app, find appointments, and click Begin Video Visit. Be sure to Select Allow for your device to access the Microphone and Camera for your visit. You will then be connected, and your provider will be with you shortly.  **If they have any issues connecting, or need assistance please contact MyChart service desk [phone])**  **If using a computer, in order to ensure the best quality for their visit they will need to use either of the following Internet Browsers: [employer], or Google Chrome**  IF USING DOXIMITY or DOXY.ME - The patient will receive a link just prior to their visit by text.     FULL LENGTH CONSENT FOR TELE-HEALTH VISIT   I hereby voluntarily request, consent and authorize [facility] and its employed or contracted physicians, physician assistants, nurse practitioners or other licensed health care professionals (the Practitioner), to provide me with telemedicine health care services (the Services") as deemed necessary by the treating Practitioner. I acknowledge and consent to receive the Services by the Practitioner via telemedicine. I understand that the telemedicine visit will involve communicating with the Practitioner through live audiovisual communication technology and the disclosure of certain medical information by electronic transmission. I acknowledge that I have been given the opportunity to request an in-person assessment or other available alternative prior to the telemedicine visit and am voluntarily participating in the telemedicine visit.  I understand that I have the right to withhold or withdraw my consent to the use of telemedicine in the course of my care at any  time, without affecting my right to future care or treatment, and that the Practitioner or I may terminate the telemedicine visit at any time. I understand that I have the right to inspect all information obtained and/or recorded in the course of the telemedicine visit and may receive copies of available information for a reasonable fee.  I understand that some of the potential risks of receiving the Services via telemedicine include:   Delay or interruption in medical evaluation due to technological equipment failure or disruption;  Information transmitted may not be sufficient (e.g. poor resolution of images) to allow for appropriate medical decision making by the Practitioner; and/or   In rare instances, security protocols could fail, causing a breach of personal health information.  Furthermore, I acknowledge that it is my responsibility to provide information about my medical history, conditions and care that is complete and accurate to the best of my ability. I acknowledge that Practitioner's advice, recommendations, and/or decision may be based on factors not within their control, such as incomplete or inaccurate data provided by me or distortions of diagnostic images or specimens that may result from  electronic transmissions. I understand that the practice of medicine is not an exact science and that Practitioner makes no warranties or guarantees regarding treatment outcomes. I acknowledge that I will receive a copy of this consent concurrently upon execution via email to the email address I last provided but may also request a printed copy by calling the office of [facility].    I understand that my insurance will be billed for this visit.   I have read or had this consent read to me.  I understand the contents of this consent, which adequately explains the benefits and risks of the Services being provided via telemedicine.   I have been provided ample opportunity to ask questions  regarding this consent and the Services and have had my questions answered to my satisfaction.  I give my informed consent for the services to be provided through the use of telemedicine in my medical care  By participating in this telemedicine visit I agree to the above.

## 2018-08-25 ENCOUNTER — Encounter: Payer: Self-pay | Admitting: Cardiology

## 2018-08-25 ENCOUNTER — Other Ambulatory Visit: Payer: Self-pay

## 2018-08-25 ENCOUNTER — Telehealth (INDEPENDENT_AMBULATORY_CARE_PROVIDER_SITE_OTHER): Payer: BC Managed Care – PPO | Admitting: Cardiology

## 2018-08-25 DIAGNOSIS — E7800 Pure hypercholesterolemia, unspecified: Secondary | ICD-10-CM

## 2018-08-25 DIAGNOSIS — G4733 Obstructive sleep apnea (adult) (pediatric): Secondary | ICD-10-CM

## 2018-08-25 DIAGNOSIS — I7121 Aneurysm of the ascending aorta, without rupture: Secondary | ICD-10-CM

## 2018-08-25 DIAGNOSIS — I2510 Atherosclerotic heart disease of native coronary artery without angina pectoris: Secondary | ICD-10-CM | POA: Diagnosis not present

## 2018-08-25 DIAGNOSIS — I712 Thoracic aortic aneurysm, without rupture: Secondary | ICD-10-CM

## 2018-08-25 DIAGNOSIS — I350 Nonrheumatic aortic (valve) stenosis: Secondary | ICD-10-CM | POA: Diagnosis not present

## 2018-08-25 NOTE — Patient Instructions (Signed)
Medication Instructions:  Your physician recommends that you continue on your current medications as directed. Please refer to the Current Medication list given to you today.  If you need a refill on your cardiac medications before your next appointment, please call your pharmacy.   Lab work: None If you have labs (blood work) drawn today and your tests are completely normal, you will receive your results only by: Marland Kitchen MyChart Message (if you have MyChart) OR . A paper copy in the mail If you have any lab test that is abnormal or we need to change your treatment, we will call you to review the results.  Testing/Procedures: Your physician has requested that you have an echocardiogram around 11/2018. Echocardiography is a painless test that uses sound waves to create images of your heart. It provides your doctor with information about the size and shape of your heart and how well your heart's chambers and valves are working. This procedure takes approximately one hour. There are no restrictions for this procedure.  Follow-Up: At [facility], you and your health needs are our priority.  As part of our continuing mission to provide you with exceptional heart care, we have created designated Provider Care Teams.  These Care Teams include your primary Cardiologist (physician) and Advanced Practice Providers (APPs -  Physician Assistants and Nurse Practitioners) who all work together to provide you with the care you need, when you need it. You will need a follow up appointment in 1 years.  Please call our office 2 months in advance to schedule this appointment.  You may see [name], MD or one of the following Advanced Practice Providers on your designated Care Team:   [place], PA-C Melina Copa, PA-C . Ermalinda Barrios, PA-C

## 2018-08-25 NOTE — Progress Notes (Signed)
Virtual Visit via Video Note   This visit type was conducted due to national recommendations for restrictions regarding the COVID-19 Pandemic (e.g. social distancing) in an effort to limit this patient's exposure and mitigate transmission in our community.  Due to his co-morbid illnesses, this patient is at least at moderate risk for complications without adequate follow up.  This format is felt to be most appropriate for this patient at this time.  All issues noted in this document were discussed and addressed.  A limited physical exam was performed with this format.  Please refer to the patient's chart for his consent to telehealth for [facility].  Evaluation Performed:  Follow-up visit  This visit type was conducted due to national recommendations for restrictions regarding the COVID-19 Pandemic (e.g. social distancing).  This format is felt to be most appropriate for this patient at this time.  All issues noted in this document were discussed and addressed.  No physical exam was performed (except for noted visual exam findings with Video Visits).  Please refer to the patient's chart (MyChart message for video visits and phone note for telephone visits) for the patient's consent to telehealth for [facility].  Date:  08/25/2018   ID:  Todd, Frank 1955/11/12, MRN [id number]  Patient Location:  Home  Provider location:   [place]  PCP:  Olin Hauser, DO  Cardiologist:  [name], MD  Electrophysiologist:  None  Chief Complaint:  CAD, bicuspid AV, OSA  History of Present Illness:    Todd Frank is a 63 y.o. male who presents via audio/video conferencing for a telehealth visit today.    Todd Frank is a 63 y.o. male with a hx of HTN, dyslipidemia, bicuspid AV, diastolic dysfunction,dilated aorta, noted prior small PFO and 2 vessel CAD. Hewas found to have progression of AS after a syncopal episode while jogging.Echocardiogram was obtained and showed  an increased gradient across his bicuspid aortic valve which had progressed from previous study.Cardiac catheterization was also performed and showed severe 2 vessel CAD. He underwent CABG x 2 utilizing LIMA to LAD, and SVG to distal PDA, AVRwith a 23 mm [employer].   Post op echo showed LVEF 50-55%, no pericardial effusion, no regional wall motion abnormalities and stable AVR.   Due to excessive daytime sleepiness he was also found to have mild obstructive sleep apnea with an AHI of 6.5/h and no significant central sleep apnea and is on CPAP.  He is here today for followup and is doing well.  He denies any chest pain or pressure, SOB, DOE, PND, orthopnea, LE edema, dizziness, palpitations or syncope. He is compliant with his meds and is tolerating meds with no SE.  He is doing well with his CPAP device and thinks that he has gotten used to it.  He tolerates the mask and feels the pressure is adequate.  Since going on CPAP he feels rested in the am and has no significant daytime sleepiness.  He denies any significant mouth or nasal dryness or nasal congestion.  He does not think that he snores.    The patient does not have symptoms concerning for COVID-19 infection (fever, chills, cough, or new shortness of breath).    Prior CV studies:   The following studies were reviewed today:  PAP compliance download, 2D echo  Past Medical History:  Diagnosis Date  . Aortic stenosis    a. syncope/severe AS s/p bioprosthetic AVR 06/2017.  . Arthritis   .  Bicuspid aortic valve   . Bradycardia    secondary to BB  . Broken clavicle    as child, can still dislocate at times  . CAD (coronary artery disease), native coronary artery    a. s/p 2V CABG at time of AVR 06/2017.  . Carotid bruit    carotid dopplers negative.  Bruit due to heart murmur from AS  . Congenital nevus   . Diastolic dysfunction   . Dilated aortic root ([place])    82mm by echo 02/2016  . GERD (gastroesophageal reflux  disease)   . Headache    optical migraine  . Hyperlipidemia    LDL goal < 70  . LVH (left ventricular hypertrophy)   . Melanoma of skin ([place])   . Obesity   . PFO (patent foramen ovale)    small by echo 2014, but not demonstrated during intra-op note from CABG/AVR 06/2017.   Past Surgical History:  Procedure Laterality Date  . AORTIC VALVE REPLACEMENT N/A 06/25/2017   Procedure: AORTIC VALVE REPLACEMENT (AVR) using Magna Ease Aortic Valve size 5mm;  Surgeon: Ivin Poot, MD;  Location: [place];  Service: Open Heart Surgery;  Laterality: N/A;  . arthritic cyst removal  2017   from Lumbar 4-5, dr pool  . CARDIAC CATHETERIZATION N/A 02/20/2016   Procedure: Right/Left Heart Cath and Coronary Angiography;  Surgeon: Belva Crome, MD;  Location: [place] [facility];  Service: Cardiovascular;  Laterality: N/A;  . CORONARY ARTERY BYPASS GRAFT N/A 06/25/2017   Procedure: CORONARY ARTERY BYPASS GRAFTING (CABG) x Two , using left internal mammary artery and right leg greater saphenous vein harvested endoscopically;  Surgeon: Ivin Poot, MD;  Location: [place];  Service: Open Heart Surgery;  Laterality: N/A;  . CORONARY/GRAFT ANGIOGRAPHY N/A 06/02/2017   Procedure: CORONARY/GRAFT ANGIOGRAPHY;  Surgeon: Nelva Bush, MD;  Location: [place] [facility];  Service: Cardiovascular;  Laterality: N/A;  . EYE SURGERY    . MELANOMA EXCISION  2011   left thigh  . RIGHT HEART CATH N/A 06/02/2017   Procedure: RIGHT HEART CATH;  Surgeon: Nelva Bush, MD;  Location: [place] [facility];  Service: Cardiovascular;  Laterality: N/A;  . TEE WITHOUT CARDIOVERSION N/A 06/25/2017   Procedure: TRANSESOPHAGEAL ECHOCARDIOGRAM (TEE);  Surgeon: Prescott Gum, Collier Salina, MD;  Location: [place];  Service: Open Heart Surgery;  Laterality: N/A;  . TONSILLECTOMY       Current Meds  Medication Sig  . aspirin EC 81 MG tablet Take 81 mg by mouth daily.  Marland Kitchen atorvastatin (LIPITOR) 80 MG tablet Take 1 tablet (80 mg total) by  mouth daily.  Marland Kitchen azelastine (ASTELIN) 0.1 % nasal spray Place 1 spray into both nostrils 2 (two) times daily. Use in each nostril as directed  . cholecalciferol (VITAMIN D) 1000 units tablet Take 1,000 Units by mouth 2 (two) times daily.  . Coenzyme Q10 (COQ-10) 200 MG CAPS Take 200 mg by mouth daily.  . fluticasone (FLONASE) 50 MCG/ACT nasal spray Place 2 sprays into both nostrils daily as needed (for allergies.).     Allergies:   Patient has no known allergies.   Social History   Tobacco Use  . Smoking status: Former Smoker    Packs/day: 1.00    Last attempt to quit: 04/07/1976    Years since quitting: 42.4  . Smokeless tobacco: Former Systems developer  . Tobacco comment: quit in 78, teenager  Substance Use Topics  . Alcohol use: Yes    Alcohol/week: 1.0 standard drinks  Types: 1 Cans of beer per week    Comment: occasional  . Drug use: No    Comment: quit marijuana in 1990     Family Hx: The patient's family history includes Arthritis in his father and sister; Diabetes in his mother; Heart attack in his unknown relative; Heart disease in his paternal grandmother; Pancreatic cancer in his maternal aunt.  ROS:   Please see the history of present illness.     All other systems reviewed and are negative.   Labs/Other Tests and Data Reviewed:    Recent Labs: 11/16/2017: Hemoglobin 15.9; Platelets 127 02/12/2018: ALT 34; BUN 14; Creatinine, Ser 0.87; Potassium 4.2; Sodium 141   Recent Lipid Panel Lab Results  Component Value Date/Time   CHOL 100 02/12/2018 01:33 PM   CHOL 173 11/02/2015   TRIG 105 02/12/2018 01:33 PM   TRIG 69 11/02/2015   HDL 45 02/12/2018 01:33 PM   CHOLHDL 2.2 02/12/2018 01:33 PM   CHOLHDL 3.1 11/02/2015   LDLCALC 34 02/12/2018 01:33 PM   LDLCALC 104 (A) 11/02/2015    Wt Readings from Last 3 Encounters:  08/25/18 186 lb (84.4 kg)  04/26/18 192 lb 6.4 oz (87.3 kg)  03/09/18 191 lb 6.4 oz (86.8 kg)     Objective:    Vital Signs:  BP 122/80   Pulse 64    Ht 5' 9.5" (1.765 m)   Wt 186 lb (84.4 kg)   SpO2 98%   BMI 27.07 kg/m    CONSTITUTIONAL:  Well nourished, well developed male in no acute distress.  EYES: anicteric MOUTH: oral mucosa is pink RESPIRATORY: Normal respiratory effort, symmetric expansion CARDIOVASCULAR: No peripheral edema SKIN: No rash, lesions or ulcers MUSCULOSKELETAL: no digital cyanosis NEURO: Cranial Nerves II-XII grossly intact, moves all extremities PSYCH: Intact judgement and insight.  A&O x 3, Mood/affect appropriate   ASSESSMENT & PLAN:     1.  OSA - the patient is tolerating PAP therapy well without any problems. The PAP download was reviewed today and showed an AHI of 0.6/hr on auto PAP with 100% compliance in using more than 4 hours nightly.  The patient has been using and benefiting from PAP use and will continue to benefit from therapy.   2.  Bicuspid AV with severe AS -he is status post AVR with a 23 mm [employer] magna ease AVR.  2D echo 07/17/2017 showed a stable AVR with mean aortic valve gradient 13 mmHg and aortic valve area 1.63 cm.  He will continue on aspirin.  3.  ASCAD - cath showed severe 2 vessel CAD s/p  CABG x 2 utilizing LIMA to LAD, and SVG to distal PDA.  He has not had any anginal sx.  He will continue on aspirin 81 mg daily and statin.  4.  Dilated ascending aorta -  this is stable at 39 mm by echo 07/25/2017.  I will repeat an echo to make sure that this has not progressed.  We will plan on doing this in July once Covid crisis has settled down.  Blood pressure is stable.  Continue on statin.  5.  Hyperlipidemia with LDL goal < 70.   His LDL goal is less than 70.  His LDL was 34 on 02/12/2018.  He says his PCP will be checking his lipids in November.  He will continue on atorvastatin 80 mg daily.  COVID-19 Education: The signs and symptoms of COVID-19 were discussed with the patient and how to seek care for testing (follow up with  PCP or arrange E-visit).  The importance of social  distancing was discussed today.  Patient Risk:   After full review of this patient's clinical status, I feel that they are at least moderate risk at this time.  Time:   Today, I have spent 15 minutes directly with the patient on video discussing medical problems including CAD, lipids, AS, OSA.  We also reviewed the symptoms of COVID 19 and the ways to protect against contracting the virus with telehealth technology.  I spent an additional 5 minutes reviewing patient's chart including PAP compliance data, labs and echo.  Medication Adjustments/Labs and Tests Ordered: Current medicines are reviewed at length with the patient today.  Concerns regarding medicines are outlined above.  Tests Ordered: No orders of the defined types were placed in this encounter.  Medication Changes: No orders of the defined types were placed in this encounter.   Disposition:  Follow up in 1 year(s)  Signed, [name], MD  08/25/2018 9:21 AM    [facility]

## 2018-09-09 DIAGNOSIS — E7800 Pure hypercholesterolemia, unspecified: Secondary | ICD-10-CM

## 2018-09-09 DIAGNOSIS — I10 Essential (primary) hypertension: Secondary | ICD-10-CM

## 2018-09-10 NOTE — Addendum Note (Signed)
Addended by: Olin Hauser on: 09/10/2018 08:10 AM   Modules accepted: Orders

## 2018-09-21 LAB — LIPID PANEL
Chol/HDL Ratio: 1.9 ratio (ref 0.0–5.0)
Cholesterol, Total: 115 mg/dL (ref 100–199)
HDL: 60 mg/dL (ref >39)
LDL Calculated: 41 mg/dL (ref 0–99)
Triglycerides: 71 mg/dL (ref 0–149)
VLDL Cholesterol Cal: 14 mg/dL (ref 5–40)

## 2018-09-21 LAB — COMPREHENSIVE METABOLIC PANEL
ALT: 26 IU/L (ref 0–44)
AST: 36 IU/L (ref 0–40)
Albumin/Globulin Ratio: 2.6 — ABNORMAL HIGH (ref 1.2–2.2)
Albumin: 4.6 g/dL (ref 3.8–4.8)
Alkaline Phosphatase: 104 IU/L (ref 39–117)
BUN/Creatinine Ratio: 11 (ref 10–24)
BUN: 10 mg/dL (ref 8–27)
Bilirubin Total: 1.4 mg/dL — ABNORMAL HIGH (ref 0.0–1.2)
CO2: 21 mmol/L (ref 20–29)
Calcium: 9.7 mg/dL (ref 8.6–10.2)
Chloride: 102 mmol/L (ref 96–106)
Creatinine, Ser: 0.9 mg/dL (ref 0.76–1.27)
GFR calc Af Amer: 105 mL/min/{1.73_m2} (ref >59)
GFR calc non Af Amer: 91 mL/min/{1.73_m2} (ref >59)
Globulin, Total: 1.8 g/dL (ref 1.5–4.5)
Glucose: 89 mg/dL (ref 65–99)
Potassium: 4.9 mmol/L (ref 3.5–5.2)
Sodium: 140 mmol/L (ref 134–144)
Total Protein: 6.4 g/dL (ref 6.0–8.5)

## 2018-11-11 ENCOUNTER — Other Ambulatory Visit: Payer: Self-pay

## 2018-11-11 ENCOUNTER — Ambulatory Visit (INDEPENDENT_AMBULATORY_CARE_PROVIDER_SITE_OTHER): Payer: BC Managed Care – PPO

## 2018-11-11 DIAGNOSIS — I712 Thoracic aortic aneurysm, without rupture: Secondary | ICD-10-CM | POA: Diagnosis not present

## 2018-11-11 DIAGNOSIS — I7121 Aneurysm of the ascending aorta, without rupture: Secondary | ICD-10-CM

## 2018-11-30 ENCOUNTER — Telehealth: Payer: Self-pay | Admitting: Family Medicine

## 2018-11-30 ENCOUNTER — Other Ambulatory Visit: Payer: Self-pay | Admitting: Family Medicine

## 2018-11-30 DIAGNOSIS — E7800 Pure hypercholesterolemia, unspecified: Secondary | ICD-10-CM

## 2018-11-30 DIAGNOSIS — D696 Thrombocytopenia, unspecified: Secondary | ICD-10-CM

## 2018-11-30 DIAGNOSIS — I10 Essential (primary) hypertension: Secondary | ICD-10-CM

## 2018-11-30 DIAGNOSIS — E663 Overweight: Secondary | ICD-10-CM

## 2018-11-30 DIAGNOSIS — Z0000 Encounter for general adult medical examination without abnormal findings: Secondary | ICD-10-CM

## 2018-11-30 DIAGNOSIS — R7309 Other abnormal glucose: Secondary | ICD-10-CM

## 2018-11-30 DIAGNOSIS — E559 Vitamin D deficiency, unspecified: Secondary | ICD-10-CM

## 2018-11-30 DIAGNOSIS — I2510 Atherosclerotic heart disease of native coronary artery without angina pectoris: Secondary | ICD-10-CM

## 2018-11-30 DIAGNOSIS — R351 Nocturia: Secondary | ICD-10-CM

## 2018-11-30 NOTE — Addendum Note (Signed)
Addended by: Olin Hauser on: 11/30/2018 05:20 PM   Modules accepted: Orders

## 2018-11-30 NOTE — Telephone Encounter (Signed)
Lab order

## 2019-01-16 IMAGING — US US CAROTID DUPLEX BILAT
1 series · 13 of 24 positions shown · non-contrast
Comparison: No prior duplex

CLINICAL DATA: 61-year-old male with a history of syncope and
collapse.

Cardiovascular risk factors include hypertension, hyperlipidemia
EXAM:
BILATERAL CAROTID DUPLEX ULTRASOUND
TECHNIQUE: Gray scale imaging, color Doppler and duplex ultrasound were
performed of bilateral carotid and vertebral arteries in the neck.

[Series 1: us carotid duplex bilat · 13 of 65 slices shown]
[im 1/65]
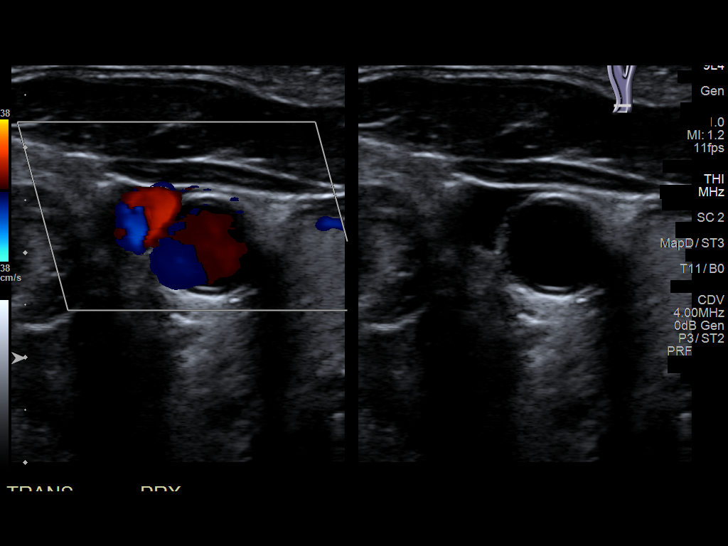
[im 6/65]
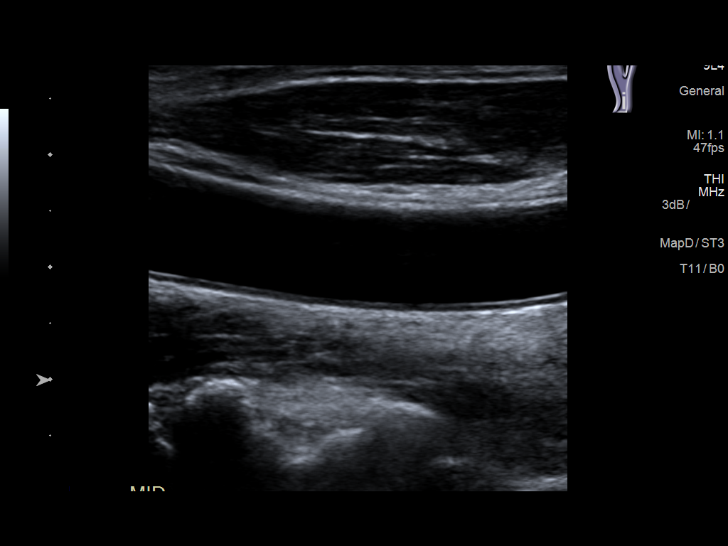
[im 12/65]
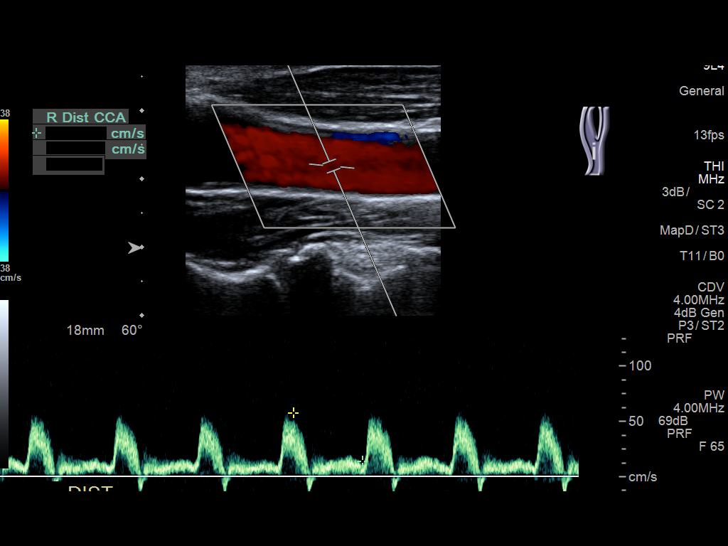
[im 17/65]
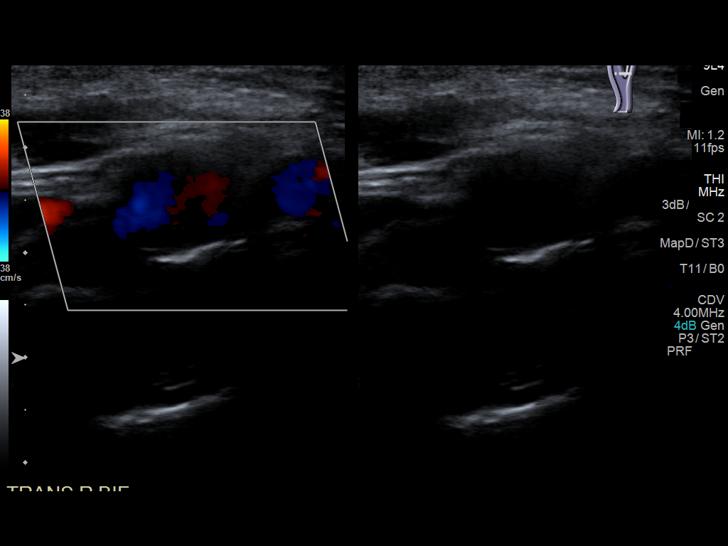
[im 23/65]
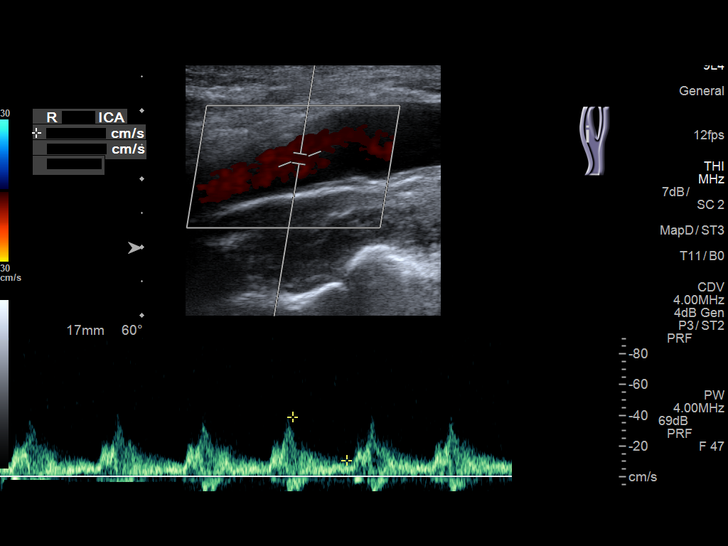
[im 28/65]
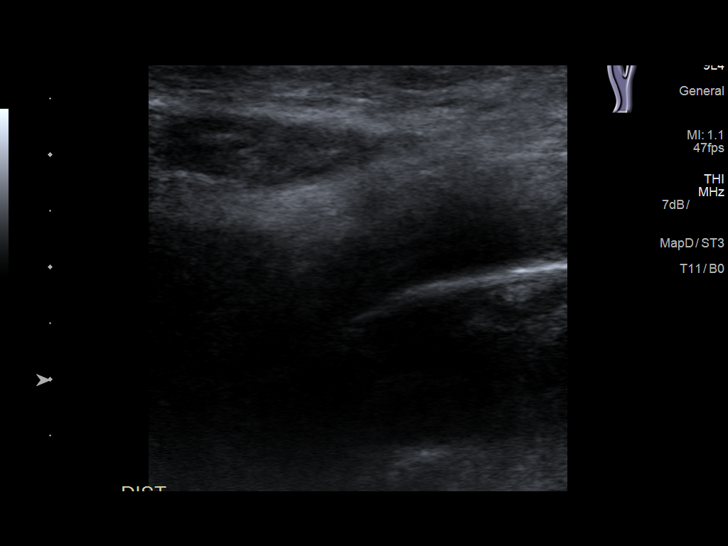
[im 34/65]
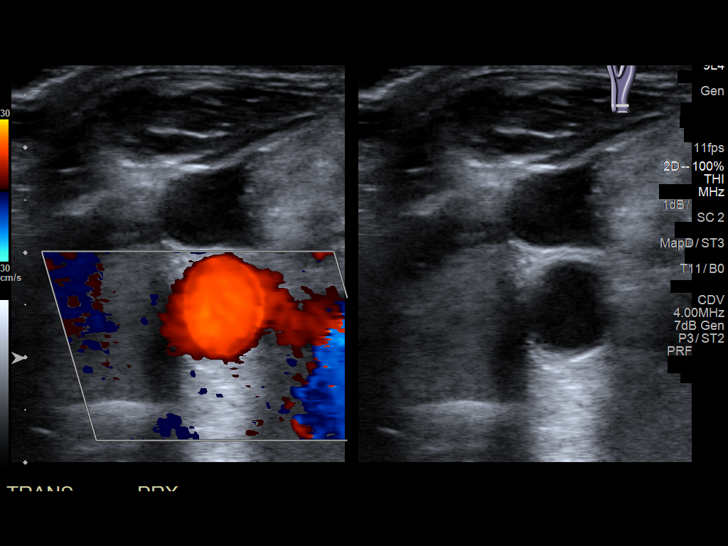
[im 37/65]
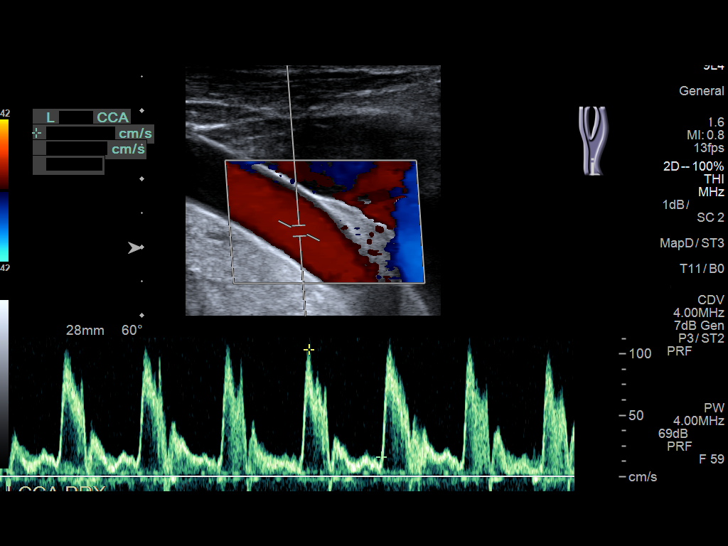
[im 42/65]
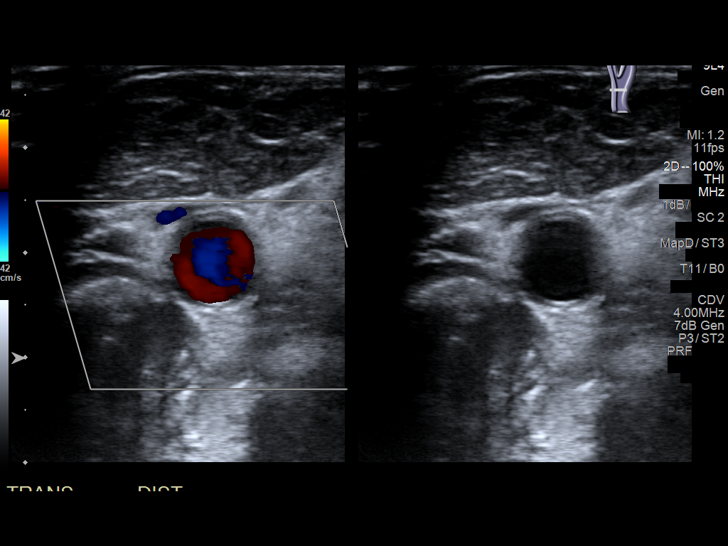
[im 48/65]
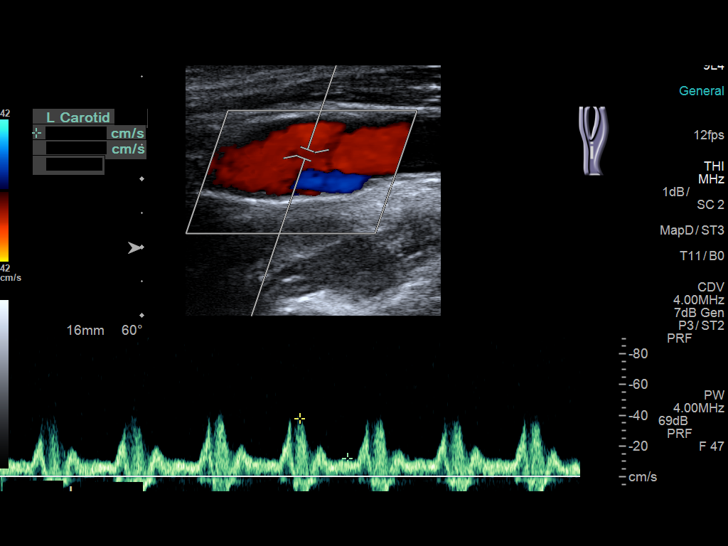
[im 53/65]
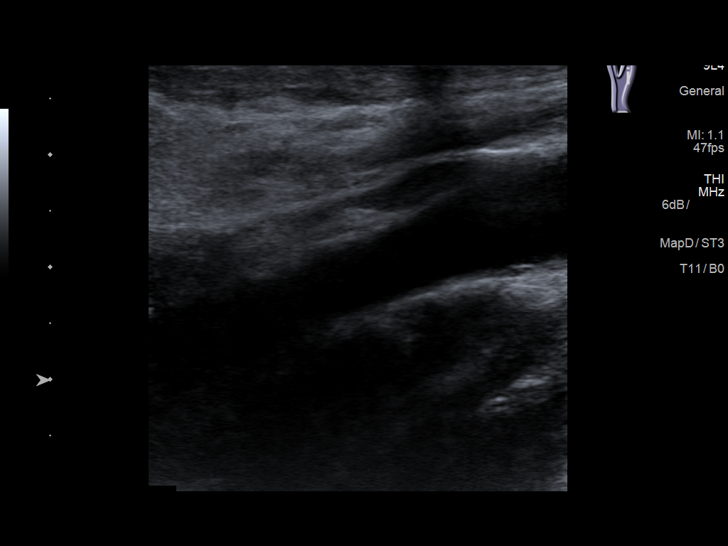
[im 59/65]
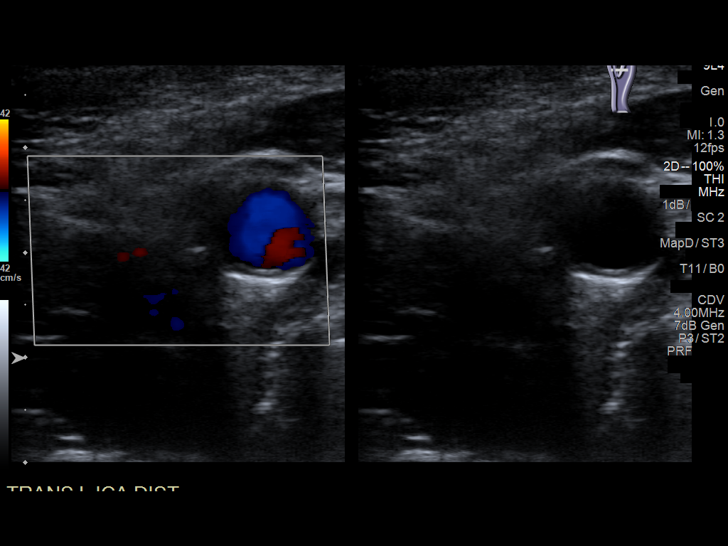
[im 65/65]
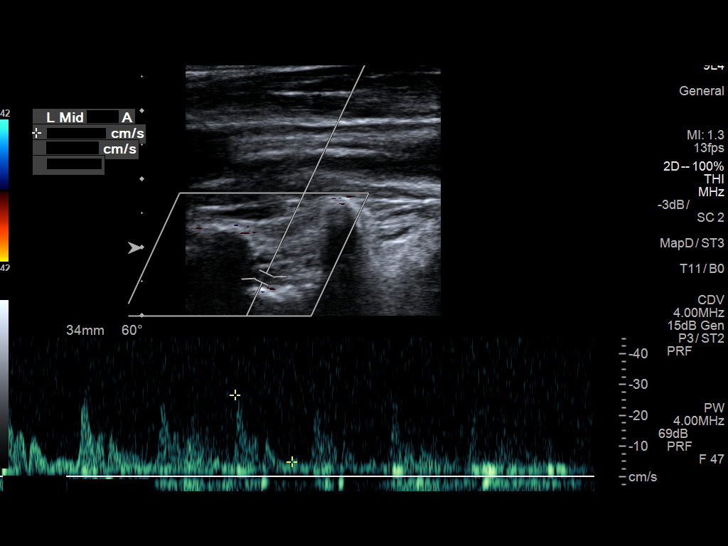

[13 of 24 positions shown; findings below may reference images not displayed]

FINDINGS: Criteria: Quantification of carotid stenosis is based on velocity
parameters that correlate the residual internal carotid diameter
with NASCET-based stenosis levels, using the diameter of the distal
internal carotid lumen as the denominator for stenosis measurement.

The following velocity measurements were obtained:

RIGHT

ICA:  Systolic 69 cm/sec, Diastolic 28 cm/sec

CCA:  57 cm/sec

SYSTOLIC ICA/CCA RATIO:

ECA:  53 cm/sec

LEFT

ICA:  Systolic 8 65 cm/sec, Diastolic 27 cm/sec

CCA:  68 cm/sec

SYSTOLIC ICA/CCA RATIO:

ECA:  54 cm/sec

Right Brachial SBP: Not acquired

Left Brachial SBP: Not acquired

RIGHT CAROTID ARTERY: No significant calcified disease of the right
common carotid artery. Intermediate waveform maintained. Homogeneous
plaque without significant calcifications at the right carotid
bifurcation. Low resistance waveform of the right ICA. No
significant tortuosity.

RIGHT VERTEBRAL ARTERY: Antegrade flow with low resistance waveform.

LEFT CAROTID ARTERY: No significant calcified disease of the left
common carotid artery. Intermediate waveform maintained. Homogeneous
plaque at the left carotid bifurcation without significant
calcifications. Low resistance waveform of the left ICA.

LEFT VERTEBRAL ARTERY:  Antegrade flow with low resistance waveform.
IMPRESSION: Color duplex indicates minimal homogeneous plaque, with no
hemodynamically significant stenosis by duplex criteria in the
extracranial cerebrovascular circulation.

## 2019-02-08 ENCOUNTER — Other Ambulatory Visit: Payer: Managed Care, Other (non HMO)

## 2019-02-15 ENCOUNTER — Ambulatory Visit (INDEPENDENT_AMBULATORY_CARE_PROVIDER_SITE_OTHER): Payer: BC Managed Care – PPO | Admitting: Family Medicine

## 2019-02-15 ENCOUNTER — Other Ambulatory Visit: Payer: Self-pay

## 2019-02-15 ENCOUNTER — Encounter: Payer: Self-pay | Admitting: Family Medicine

## 2019-02-15 VITALS — BP 121/74 | HR 80 | Temp 98.8°F | Resp 16 | Ht 69.0 in | Wt 189.0 lb

## 2019-02-15 DIAGNOSIS — I10 Essential (primary) hypertension: Secondary | ICD-10-CM

## 2019-02-15 DIAGNOSIS — R351 Nocturia: Secondary | ICD-10-CM

## 2019-02-15 DIAGNOSIS — R7309 Other abnormal glucose: Secondary | ICD-10-CM

## 2019-02-15 DIAGNOSIS — E7800 Pure hypercholesterolemia, unspecified: Secondary | ICD-10-CM

## 2019-02-15 DIAGNOSIS — I2510 Atherosclerotic heart disease of native coronary artery without angina pectoris: Secondary | ICD-10-CM

## 2019-02-15 DIAGNOSIS — Z0000 Encounter for general adult medical examination without abnormal findings: Secondary | ICD-10-CM | POA: Diagnosis not present

## 2019-02-15 DIAGNOSIS — I77810 Thoracic aortic ectasia: Secondary | ICD-10-CM

## 2019-02-15 DIAGNOSIS — G4733 Obstructive sleep apnea (adult) (pediatric): Secondary | ICD-10-CM

## 2019-02-15 DIAGNOSIS — J3089 Other allergic rhinitis: Secondary | ICD-10-CM | POA: Diagnosis not present

## 2019-02-15 DIAGNOSIS — Z9989 Dependence on other enabling machines and devices: Secondary | ICD-10-CM

## 2019-02-15 DIAGNOSIS — D696 Thrombocytopenia, unspecified: Secondary | ICD-10-CM

## 2019-02-15 DIAGNOSIS — E559 Vitamin D deficiency, unspecified: Secondary | ICD-10-CM | POA: Diagnosis not present

## 2019-02-15 MED ORDER — AZELASTINE HCL 0.1 % NA SOLN: 1.0000 | Freq: Two times a day (BID) | NASAL | 2 refills | Status: DC

## 2019-02-15 NOTE — Progress Notes (Addendum)
Subjective:    Patient ID: Todd Frank, male    DOB: 02/27/1956, 63 y.o.   MRN: [id number]  Todd Frank is a 63 y.o. male presenting on 02/15/2019 for Annual Exam   HPI   Here for Annual Physical and Lab Review.  Lifestyle / BMI >27 - Overall doing well with lifestyle, some weight loss, some fluctuation - He continues balanced diet, limited without gym right now  Thrombocytopenia, chronic Last lab showed mild low 127 plt and peripheral smear was giant platelet and clumping. No new concerns. Due for lab Denies bleeding or bruising  CHRONIC HTN: Reports no recent concerns, remains well controlled, checks BP regularly at home. Current Meds - Metoprolol tartrate 12.5mg  BID (half tab 25)  CAD, Severe Aortic Stenosis s/p AVR Still feels like he is recovering from cardiac surgery, now 1.5 years, he still exercises regularly, occasionally has some soreness but no pain. He has avoided heavy lifting using chest. - Followed by [facility] (Dr [name]) / Cardiothoracic Surgery Dr Prescott Gum S/p AVR CABG. He has done well. Completed cardiac rehab. - Last ECHO 11/2018, will continue to follow - On ASA 81mg  daily  HYPERLIPIDEMIA: - Followed by Cardiology, has been on high dose lipitor with Atorvastatin 80mg  daily, goal LDL < 70 Pending repeat lipids now  History of Dermatology [facility], Dr Nehemiah Massed Prior malignant melanoma removed. AKs frozen in past  Additional follow-up concerns:  Sinus/Allergies Needs refill on Azelastine spray. Good results.   OSA updated initial setting auto pap 04/2018   Health Maintenance: UTD Shingrix  -Colon CA Screening: UTD - last screen w/ cologuard 03/22/17 (negative) next due in 03/2020 - Last Colonoscopy done >10 years ago approx (done by [facility]), results with reported normal next due 10 years, 11/2016. Currently asymptomatic. No known family history of colon CA  - UTD Flu vaccine 01/05/19  - UTD routine Hep C  and HIV screen  - UTD TDap  -Prostate CA Screening:Last prostateCA screening PSA 1.1 (02/2018), prior DREreported normal. Currently asymptomatic.No known family history of prostate CA. Due for screeningPSA - order printed for [facility] given to patient   Depression screen [facility] 2/9 02/15/2019 02/12/2018 11/13/2017  Decreased Interest 0 0 0  Down, Depressed, Hopeless 0 0 0  PHQ - 2 Score 0 0 0  Altered sleeping 0 - -  Tired, decreased energy 0 - -  Change in appetite 0 - -  Feeling bad or failure about yourself  0 - -  Trouble concentrating 0 - -  Moving slowly or fidgety/restless 0 - -  Suicidal thoughts 0 - -  PHQ-9 Score 0 - -  Difficult doing work/chores - - -    Past Medical History:  Diagnosis Date  . Aortic stenosis    a. syncope/severe AS s/p bioprosthetic AVR 06/2017.  . Arthritis   . Bicuspid aortic valve   . Bradycardia    secondary to BB  . Broken clavicle    as child, can still dislocate at times  . CAD (coronary artery disease), native coronary artery    a. s/p 2V CABG at time of AVR 06/2017.  . Carotid bruit    carotid dopplers negative.  Bruit due to heart murmur from AS  . Congenital nevus   . Diastolic dysfunction   . Dilated aortic root ([place])    79mm by echo 02/2016  . GERD (gastroesophageal reflux disease)   . Headache    optical migraine  . Hx of dysplastic nevus  2010   multiple sites   . Hx of melanoma in situ 2011   Left superior thigh   . Hyperlipidemia    LDL goal < 70  . LVH (left ventricular hypertrophy)   . Melanoma of skin ([place])   . Obesity   . PFO (patent foramen ovale)    small by echo 2014, but not demonstrated during intra-op note from CABG/AVR 06/2017.   Past Surgical History:  Procedure Laterality Date  . AORTIC VALVE REPLACEMENT N/A 06/25/2017   Procedure: AORTIC VALVE REPLACEMENT (AVR) using Magna Ease Aortic Valve size 22mm;  Surgeon: Ivin Poot, MD;  Location: [place];  Service: Open Heart Surgery;  Laterality: N/A;  .  arthritic cyst removal  2017   from Lumbar 4-5, dr pool  . CARDIAC CATHETERIZATION N/A 02/20/2016   Procedure: Right/Left Heart Cath and Coronary Angiography;  Surgeon: Belva Crome, MD;  Location: [place] [facility];  Service: Cardiovascular;  Laterality: N/A;  . CORONARY ARTERY BYPASS GRAFT N/A 06/25/2017   Procedure: CORONARY ARTERY BYPASS GRAFTING (CABG) x Two , using left internal mammary artery and right leg greater saphenous vein harvested endoscopically;  Surgeon: Ivin Poot, MD;  Location: [place];  Service: Open Heart Surgery;  Laterality: N/A;  . CORONARY/GRAFT ANGIOGRAPHY N/A 06/02/2017   Procedure: CORONARY/GRAFT ANGIOGRAPHY;  Surgeon: Nelva Bush, MD;  Location: [place] [facility];  Service: Cardiovascular;  Laterality: N/A;  . EYE SURGERY    . MELANOMA EXCISION  2011   left thigh  . RIGHT HEART CATH N/A 06/02/2017   Procedure: RIGHT HEART CATH;  Surgeon: Nelva Bush, MD;  Location: [place] [facility];  Service: Cardiovascular;  Laterality: N/A;  . TEE WITHOUT CARDIOVERSION N/A 06/25/2017   Procedure: TRANSESOPHAGEAL ECHOCARDIOGRAM (TEE);  Surgeon: Prescott Gum, Collier Salina, MD;  Location: [place];  Service: Open Heart Surgery;  Laterality: N/A;  . TONSILLECTOMY     Social History   Socioeconomic History  . Marital status: Married    Spouse name: Todd Frank  . Number of children: 0  . Years of education: Assoc degree  . Highest education level: Not on file  Occupational History  . Occupation: Government social research officer - [place]  . Financial resource strain: Not hard at all  . Food insecurity    Worry: Never true    Inability: Never true  . Transportation needs    Medical: No    Non-medical: No  Tobacco Use  . Smoking status: Former Smoker    Packs/day: 1.00    Quit date: 04/07/1976    Years since quitting: 42.8  . Smokeless tobacco: Former Systems developer  . Tobacco comment: quit in 78, teenager  Substance and Sexual Activity  . Alcohol use: Yes    Alcohol/week: 1.0  standard drinks    Types: 1 Cans of beer per week    Comment: occasional  . Drug use: No    Comment: quit marijuana in 1990  . Sexual activity: Yes  Lifestyle  . Physical activity    Days per week: 7 days    Minutes per session: 80 min  . Stress: Not at all  Relationships  . Social Herbalist on phone: Three times a week    Gets together: More than three times a week    Attends religious service: Never    Active member of club or organization: No    Attends meetings of clubs or organizations: Never    Relationship status: Married  .  Intimate partner violence    Fear of current or ex partner: No    Emotionally abused: No    Physically abused: No    Forced sexual activity: No  Other Topics Concern  . Not on file  Social History Narrative   Lives with wife   Caffeine < 1 cup daily   Family History  Problem Relation Age of Onset  . Diabetes Mother   . Heart disease Mother   . Arthritis Father   . Arthritis Sister   . Heart disease Paternal Grandmother        MI  . Heart attack Other        family history  . Pancreatic cancer Maternal Aunt    Current Outpatient Medications on File Prior to Visit  Medication Sig  . aspirin EC 81 MG tablet Take 81 mg by mouth daily.  Marland Kitchen atorvastatin (LIPITOR) 80 MG tablet Take 1 tablet (80 mg total) by mouth daily.  . cholecalciferol (VITAMIN D) 1000 units tablet Take 1,000 Units by mouth 2 (two) times daily.  . Coenzyme Q10 (COQ-10) 200 MG CAPS Take 200 mg by mouth daily.  . fluticasone (FLONASE) 50 MCG/ACT nasal spray Place 2 sprays into both nostrils daily as needed (for allergies.).   No current facility-administered medications on file prior to visit.     Review of Systems  Constitutional: Negative for activity change, appetite change, chills, diaphoresis, fatigue and fever.  HENT: Negative for congestion and hearing loss.   Eyes: Negative for visual disturbance.  Respiratory: Negative for apnea, cough, choking, chest  tightness, shortness of breath and wheezing.   Cardiovascular: Negative for chest pain, palpitations and leg swelling.  Gastrointestinal: Negative for abdominal pain, anal bleeding, blood in stool, constipation, diarrhea, nausea and vomiting.  Endocrine: Negative for cold intolerance.  Genitourinary: Negative for difficulty urinating, dysuria, frequency and hematuria.  Musculoskeletal: Negative for arthralgias, back pain and neck pain.  Skin: Negative for rash.  Allergic/Immunologic: Negative for environmental allergies.  Neurological: Negative for dizziness, weakness, light-headedness, numbness and headaches.  Hematological: Negative for adenopathy.  Psychiatric/Behavioral: Negative for behavioral problems, dysphoric mood and sleep disturbance. The patient is not nervous/anxious.    Per HPI unless specifically indicated above      Objective:    BP 121/74 (BP Location: Left Arm, Patient Position: Sitting, Cuff Size: Normal)   Pulse 80   Temp 98.8 F (37.1 C) (Oral)   Resp 16   Ht 5\' 9"  (1.753 m)   Wt 189 lb (85.7 kg)   SpO2 98%   BMI 27.91 kg/m   Wt Readings from Last 3 Encounters:  02/15/19 189 lb (85.7 kg)  08/25/18 186 lb (84.4 kg)  04/26/18 192 lb 6.4 oz (87.3 kg)    Physical Exam Vitals signs and nursing note reviewed.  Constitutional:      General: He is not in acute distress.    Appearance: He is well-developed. He is not diaphoretic.     Comments: Well-appearing, comfortable, cooperative  HENT:     Head: Normocephalic and atraumatic.  Eyes:     General:        Right eye: No discharge.        Left eye: No discharge.     Conjunctiva/sclera: Conjunctivae normal.     Pupils: Pupils are equal, round, and reactive to light.  Neck:     Musculoskeletal: Normal range of motion and neck supple.     Thyroid: No thyromegaly.  Cardiovascular:     Rate and  Rhythm: Normal rate and regular rhythm.     Heart sounds: Normal heart sounds. No murmur.  Pulmonary:     Effort:  Pulmonary effort is normal. No respiratory distress.     Breath sounds: Normal breath sounds. No wheezing or rales.  Abdominal:     General: Bowel sounds are normal. There is no distension.     Palpations: Abdomen is soft. There is no mass.     Tenderness: There is no abdominal tenderness.  Musculoskeletal: Normal range of motion.        General: No tenderness.     Comments: Upper / Lower Extremities: - Normal muscle tone, strength bilateral upper extremities 5/5, lower extremities 5/5  Lymphadenopathy:     Cervical: No cervical adenopathy.  Skin:    General: Skin is warm and dry.     Findings: No erythema or rash.  Neurological:     Mental Status: He is alert and oriented to person, place, and time.     Comments: Distal sensation intact to light touch all extremities  Psychiatric:        Behavior: Behavior normal.     Comments: Well groomed, good eye contact, normal speech and thoughts        Results for orders placed or performed in visit on 09/09/18  Comprehensive metabolic panel  Result Value Ref Range   Glucose 89 65 - 99 mg/dL   BUN 10 8 - 27 mg/dL   Creatinine, Ser 0.90 0.76 - 1.27 mg/dL   GFR calc non Af Amer 91 >59 mL/min/1.73   GFR calc Af Amer 105 >59 mL/min/1.73   BUN/Creatinine Ratio 11 10 - 24   Sodium 140 134 - 144 mmol/L   Potassium 4.9 3.5 - 5.2 mmol/L   Chloride 102 96 - 106 mmol/L   CO2 21 20 - 29 mmol/L   Calcium 9.7 8.6 - 10.2 mg/dL   Total Protein 6.4 6.0 - 8.5 g/dL   Albumin 4.6 3.8 - 4.8 g/dL   Globulin, Total 1.8 1.5 - 4.5 g/dL   Albumin/Globulin Ratio 2.6 (H) 1.2 - 2.2   Bilirubin Total 1.4 (H) 0.0 - 1.2 mg/dL   Alkaline Phosphatase 104 39 - 117 IU/L   AST 36 0 - 40 IU/L   ALT 26 0 - 44 IU/L  Lipid panel  Result Value Ref Range   Cholesterol, Total 115 100 - 199 mg/dL   Triglycerides 71 0 - 149 mg/dL   HDL 60 >39 mg/dL   VLDL Cholesterol Cal 14 5 - 40 mg/dL   LDL Calculated 41 0 - 99 mg/dL   Chol/HDL Ratio 1.9 0.0 - 5.0 ratio       Assessment & Plan:   Problem List Items Addressed This Visit    Vitamin D deficiency   Thrombocytopenia (HCC)    Previously low No complication Check CBC      OSA on CPAP    Well controlled, chronic OSA on CPAP - Good adherence to CPAP nightly - Continue current CPAP therapy, patient seems to be benefiting from therapy       Hypercholesteremia    Due for lipids Controlled cholesterol on statin and lifestyle At goal LDL < 70 CAD  Plan: 1. Continue current meds - Atorvastatin 80mg  daily 2. Continue ASA 81mg  for secondary ASCVD risk reduction 3. Encourage improved lifestyle - low carb/cholesterol, reduce portion size, continue improving regular exercise 4. Follow-up w/ cardiology, yearly lipids       Essential hypertension    Well-controlled  HTN - Home BP readings controlled  Complication CAD, AS s/p AVR CABG Followed by Cardiology  Plan:  1. Continue Metoprolol 12.5mg  BID (half of 25mg ) 2. Encourage improved lifestyle - low sodium diet, regular exercise 3. Continue monitor BP outside office, bring readings to next visit, if persistently >140/90 or new symptoms notify office sooner 4. Follow-up 1 year for annual      Dilated aortic root (HCC)    Stable Followed by Cardiology/Cardiothoracic surgery S/p CABG AVR in 2019      CAD (coronary artery disease), native coronary artery    Stable without angina Followed by [facility] Cardiology Dr Radford Pax / Cardiothoracic Surgery S/p CABG AVR Continues on medication management - ASA 81, BB, Statin      Allergic rhinitis    RE order Azelastine      Relevant Medications   azelastine (ASTELIN) 0.1 % nasal spray    Other Visit Diagnoses    Annual physical exam    -  Primary   Nocturia       Abnormal glucose         Updated Health Maintenance information Reviewed recent lab results with patient Encouraged improvement to lifestyle with diet and exercise - Goal of weight loss    Meds ordered this encounter   Medications  . azelastine (ASTELIN) 0.1 % nasal spray    Sig: Place 1 spray into both nostrils 2 (two) times daily. Use in each nostril as directed    Dispense:  30 mL    Refill:  2     Follow up plan: Return in about 1 year (around 02/15/2020) for Annual Physical.  Future lab orders for [facility] will be placed for 02/13/20  Nobie Putnam, DO [facility] 02/15/2019, 9:25 AM

## 2019-02-15 NOTE — Patient Instructions (Addendum)
Thank you for coming to the office today.  Follow up with Dr Radford Pax.  No change to heart sounds today.  Cologuard 03/2020  Refilled Azelastine nasal spray  [facility] for upcoming labs, we will review results and send to your mychart  Please schedule a Follow-up Appointment to: Return in about 1 year (around 02/15/2020) for Annual Physical.  If you have any other questions or concerns, please feel free to call the office or send a message through [place]. You may also schedule an earlier appointment if necessary.  Additionally, you may be receiving a survey about your experience at our office within a few days to 1 week by e-mail or mail. We value your feedback.  Nobie Putnam, DO [place]

## 2019-02-16 NOTE — Assessment & Plan Note (Signed)
Due for lipids Controlled cholesterol on statin and lifestyle At goal LDL < 70 CAD  Plan: 1. Continue current meds - Atorvastatin 80mg  daily 2. Continue ASA 81mg  for secondary ASCVD risk reduction 3. Encourage improved lifestyle - low carb/cholesterol, reduce portion size, continue improving regular exercise 4. Follow-up w/ cardiology, yearly lipids

## 2019-02-16 NOTE — Assessment & Plan Note (Signed)
Previously low No complication Check CBC

## 2019-02-16 NOTE — Assessment & Plan Note (Signed)
Stable without angina Followed by [facility] Cardiology Dr Radford Pax / Cardiothoracic Surgery S/p CABG AVR Continues on medication management - ASA 81, BB, Statin

## 2019-02-16 NOTE — Assessment & Plan Note (Signed)
Well controlled, chronic OSA on CPAP - Good adherence to CPAP nightly - Continue current CPAP therapy, patient seems to be benefiting from therapy  

## 2019-02-16 NOTE — Assessment & Plan Note (Signed)
RE order Azelastine

## 2019-02-16 NOTE — Assessment & Plan Note (Signed)
Well-controlled HTN - Home BP readings controlled  Complication CAD, AS s/p AVR CABG Followed by Cardiology  Plan:  1. Continue Metoprolol 12.5mg  BID (half of 25mg ) 2. Encourage improved lifestyle - low sodium diet, regular exercise 3. Continue monitor BP outside office, bring readings to next visit, if persistently >140/90 or new symptoms notify office sooner 4. Follow-up 1 year for annual

## 2019-02-16 NOTE — Assessment & Plan Note (Signed)
Stable Followed by Cardiology/Cardiothoracic surgery S/p CABG AVR in 2019

## 2019-02-23 LAB — CBC WITH DIFFERENTIAL/PLATELET
Basophils Absolute: 0.1 10*3/uL (ref 0.0–0.2)
Basos: 1 %
EOS (ABSOLUTE): 0.2 10*3/uL (ref 0.0–0.4)
Eos: 4 %
Hematocrit: 47.8 % (ref 37.5–51.0)
Hemoglobin: 16.3 g/dL (ref 13.0–17.7)
Immature Grans (Abs): 0 10*3/uL (ref 0.0–0.1)
Immature Granulocytes: 0 %
Lymphocytes Absolute: 1.2 10*3/uL (ref 0.7–3.1)
Lymphs: 27 %
MCH: 31.9 pg (ref 26.6–33.0)
MCHC: 34.1 g/dL (ref 31.5–35.7)
MCV: 94 fL (ref 79–97)
Monocytes Absolute: 0.4 10*3/uL (ref 0.1–0.9)
Monocytes: 10 %
Neutrophils Absolute: 2.5 10*3/uL (ref 1.4–7.0)
Neutrophils: 58 %
Platelets: 97 10*3/uL — CL (ref 150–450)
RBC: 5.11 x10E6/uL (ref 4.14–5.80)
RDW: 13 % (ref 11.6–15.4)
WBC: 4.3 10*3/uL (ref 3.4–10.8)

## 2019-02-23 LAB — COMPREHENSIVE METABOLIC PANEL
ALT: 23 IU/L (ref 0–44)
AST: 31 IU/L (ref 0–40)
Albumin/Globulin Ratio: 2.4 — ABNORMAL HIGH (ref 1.2–2.2)
Albumin: 4.5 g/dL (ref 3.8–4.8)
Alkaline Phosphatase: 101 IU/L (ref 39–117)
BUN/Creatinine Ratio: 12 (ref 10–24)
BUN: 10 mg/dL (ref 8–27)
Bilirubin Total: 1.3 mg/dL — ABNORMAL HIGH (ref 0.0–1.2)
CO2: 22 mmol/L (ref 20–29)
Calcium: 9.6 mg/dL (ref 8.6–10.2)
Chloride: 102 mmol/L (ref 96–106)
Creatinine, Ser: 0.83 mg/dL (ref 0.76–1.27)
GFR calc Af Amer: 107 mL/min/{1.73_m2} (ref >59)
GFR calc non Af Amer: 93 mL/min/{1.73_m2} (ref >59)
Globulin, Total: 1.9 g/dL (ref 1.5–4.5)
Glucose: 81 mg/dL (ref 65–99)
Potassium: 4.6 mmol/L (ref 3.5–5.2)
Sodium: 139 mmol/L (ref 134–144)
Total Protein: 6.4 g/dL (ref 6.0–8.5)

## 2019-02-23 LAB — LIPID PANEL
Chol/HDL Ratio: 2.1 ratio (ref 0.0–5.0)
Cholesterol, Total: 115 mg/dL (ref 100–199)
HDL: 56 mg/dL (ref >39)
LDL Chol Calc (NIH): 44 mg/dL (ref 0–99)
Triglycerides: 74 mg/dL (ref 0–149)
VLDL Cholesterol Cal: 15 mg/dL (ref 5–40)

## 2019-02-23 LAB — HEMOGLOBIN A1C
Est. average glucose Bld gHb Est-mCnc: 105 mg/dL
Hgb A1c MFr Bld: 5.3 % (ref 4.8–5.6)

## 2019-02-23 LAB — PSA: Prostate Specific Ag, Serum: 0.9 ng/mL (ref 0.0–4.0)

## 2019-02-23 LAB — VITAMIN D 25 HYDROXY (VIT D DEFICIENCY, FRACTURES): Vit D, 25-Hydroxy: 32.1 ng/mL (ref 30.0–100.0)

## 2019-03-23 ENCOUNTER — Other Ambulatory Visit: Payer: Self-pay | Admitting: Family Medicine

## 2019-03-23 DIAGNOSIS — D696 Thrombocytopenia, unspecified: Secondary | ICD-10-CM

## 2019-03-23 DIAGNOSIS — E559 Vitamin D deficiency, unspecified: Secondary | ICD-10-CM

## 2019-03-23 DIAGNOSIS — I2510 Atherosclerotic heart disease of native coronary artery without angina pectoris: Secondary | ICD-10-CM

## 2019-03-23 DIAGNOSIS — I10 Essential (primary) hypertension: Secondary | ICD-10-CM

## 2019-03-23 DIAGNOSIS — R7309 Other abnormal glucose: Secondary | ICD-10-CM

## 2019-03-23 DIAGNOSIS — Z125 Encounter for screening for malignant neoplasm of prostate: Secondary | ICD-10-CM

## 2019-03-23 DIAGNOSIS — Z0000 Encounter for general adult medical examination without abnormal findings: Secondary | ICD-10-CM

## 2019-03-23 DIAGNOSIS — E7800 Pure hypercholesterolemia, unspecified: Secondary | ICD-10-CM

## 2019-03-23 DIAGNOSIS — R351 Nocturia: Secondary | ICD-10-CM

## 2019-04-20 ENCOUNTER — Other Ambulatory Visit: Payer: Self-pay | Admitting: Cardiology

## 2019-08-04 DIAGNOSIS — J3089 Other allergic rhinitis: Secondary | ICD-10-CM

## 2019-08-04 MED ORDER — FLUTICASONE PROPIONATE 50 MCG/ACT NA SUSP: 2.0000 | Freq: Every day | NASAL | 3 refills | Status: DC | PRN

## 2019-08-24 ENCOUNTER — Encounter: Payer: Self-pay | Admitting: Cardiology

## 2019-08-24 NOTE — Progress Notes (Signed)
Date:  08/25/2019   ID:  Ramses, Lengle July 29, 1955, MRN [id number]  PCP:  Olin Hauser, DO  Cardiologist:  [name], MD  Electrophysiologist:  None  Chief Complaint:  CAD, bicuspid AV, OSA  History of Present Illness:    Todd Frank is a 64 y.o. male with a hx of HTN, dyslipidemia, bicuspid AV, diastolic dysfunction,dilated aorta, noted prior small PFO and 2 vessel CAD. Hewas found to have progression of AS after a syncopal episode while jogging.Echocardiogram was obtained and showed an increased gradient across his bicuspid aortic valve which had progressed from previous study.Cardiac catheterization was also performed and showed severe 2 vessel CAD. He underwent CABG x 2 utilizing LIMA to LAD, and SVG to distal PDA, AVRwith a 23 mm [employer]. Post op echo showed LVEF 50-55%, no pericardial effusion, no regional wall motion abnormalities and stable AVR.   Due to excessive daytime sleepiness he was also found to have mild obstructive sleep apnea with an AHI of 6.5/h and no significant central sleep apnea and is on CPAP.  He is here today for followup.  He has continued to complain of chest pain that has never gone away since his CABG in 2019. The pain is midsternal but radiates across his chest and into his neck and arms.  Will will last a few hours at a time and then resolve.  He says it feels like a muscle tightness.  It is not worse with exertion and it occurs when in bed at night as well.  He sometimes will get lightheaded with it.  There is no associated SOB, diaphoresis or nausea.  It is not positional and not worse with deep breathing or palpation of his chest wall.  He does not think it is related to GERD as antiacids have no effect.   He denies any  SOB, DOE, PND, orthopnea, LE edema, palpitations or syncope. He is compliant with his meds and is tolerating meds with no SE.    He is doing well with his CPAP device and thinks that he has gotten used  to it.  He tolerates the mask and feels the pressure is adequate.  Since going on CPAP he feels rested in the am and has no significant daytime sleepiness.  He denies any significant mouth or nasal dryness or nasal congestion.  He does not think that he snores.    The patient does not have symptoms concerning for COVID-19 infection (fever, chills, cough, or new shortness of breath).   Prior CV studies:   The following studies were reviewed today:  PAP compliance download, 2D echo, EKG  Past Medical History:  Diagnosis Date  . Aortic stenosis    a. syncope/severe AS s/p bioprosthetic AVR 06/2017.  . Arthritis   . Ascending aorta dilatation (HCC)    77mm by echo 11/2018  . Bicuspid aortic valve   . Bradycardia    secondary to BB  . Broken clavicle    as child, can still dislocate at times  . CAD (coronary artery disease), native coronary artery    a. s/p 2V CABG at time of AVR 06/2017.  . Carotid bruit    carotid dopplers negative.  Bruit due to heart murmur from AS  . Congenital nevus   . Diastolic dysfunction   . GERD (gastroesophageal reflux disease)   . Headache    optical migraine  . Hx of dysplastic nevus 2010   multiple sites   . Hx  of melanoma in situ 2011   Left superior thigh   . Hyperlipidemia    LDL goal < 70  . LVH (left ventricular hypertrophy)   . Melanoma of skin ([place])   . Obesity   . PFO (patent foramen ovale)    small by echo 2014, but not demonstrated during intra-op note from CABG/AVR 06/2017.   Past Surgical History:  Procedure Laterality Date  . AORTIC VALVE REPLACEMENT N/A 06/25/2017   Procedure: AORTIC VALVE REPLACEMENT (AVR) using Magna Ease Aortic Valve size 46mm;  Surgeon: Ivin Poot, MD;  Location: [place];  Service: Open Heart Surgery;  Laterality: N/A;  . arthritic cyst removal  2017   from Lumbar 4-5, dr pool  . CARDIAC CATHETERIZATION N/A 02/20/2016   Procedure: Right/Left Heart Cath and Coronary Angiography;  Surgeon: Belva Crome, MD;   Location: [place] [facility];  Service: Cardiovascular;  Laterality: N/A;  . CORONARY ARTERY BYPASS GRAFT N/A 06/25/2017   Procedure: CORONARY ARTERY BYPASS GRAFTING (CABG) x Two , using left internal mammary artery and right leg greater saphenous vein harvested endoscopically;  Surgeon: Ivin Poot, MD;  Location: [place];  Service: Open Heart Surgery;  Laterality: N/A;  . CORONARY/GRAFT ANGIOGRAPHY N/A 06/02/2017   Procedure: CORONARY/GRAFT ANGIOGRAPHY;  Surgeon: Nelva Bush, MD;  Location: [place] [facility];  Service: Cardiovascular;  Laterality: N/A;  . EYE SURGERY    . MELANOMA EXCISION  2011   left thigh  . RIGHT HEART CATH N/A 06/02/2017   Procedure: RIGHT HEART CATH;  Surgeon: Nelva Bush, MD;  Location: [place] [facility];  Service: Cardiovascular;  Laterality: N/A;  . TEE WITHOUT CARDIOVERSION N/A 06/25/2017   Procedure: TRANSESOPHAGEAL ECHOCARDIOGRAM (TEE);  Surgeon: Prescott Gum, Collier Salina, MD;  Location: [place];  Service: Open Heart Surgery;  Laterality: N/A;  . TONSILLECTOMY       Current Meds  Medication Sig  . aspirin EC 81 MG tablet Take 81 mg by mouth daily.  Marland Kitchen atorvastatin (LIPITOR) 80 MG tablet Take 1 tablet (80 mg total) by mouth daily at 6 PM. Please make yearly appt with [name] for May for future refills. 1st attempt  . azelastine (ASTELIN) 0.1 % nasal spray Place 1 spray into both nostrils 2 (two) times daily. Use in each nostril as directed  . cholecalciferol (VITAMIN D) 1000 units tablet Take 1,000 Units by mouth 2 (two) times daily.  . Coenzyme Q10 (COQ-10) 200 MG CAPS Take 200 mg by mouth daily.  . fluticasone (FLONASE) 50 MCG/ACT nasal spray Place 2 sprays into both nostrils daily as needed (for allergies.).     Allergies:   Patient has no known allergies.   Social History   Tobacco Use  . Smoking status: Former Smoker    Packs/day: 1.00    Quit date: 04/07/1976    Years since quitting: 43.4  . Smokeless tobacco: Former Systems developer  . Tobacco comment:  quit in 78, teenager  Substance Use Topics  . Alcohol use: Yes    Alcohol/week: 1.0 standard drinks    Types: 1 Cans of beer per week    Comment: occasional  . Drug use: No    Comment: quit marijuana in 1990     Family Hx: The patient's family history includes Arthritis in his father and sister; Diabetes in his mother; Heart attack in an other family member; Heart disease in his mother and paternal grandmother; Pancreatic cancer in his maternal aunt.  ROS:   Please see the history of  present illness.     All other systems reviewed and are negative.   Labs/Other Tests and Data Reviewed:    Recent Labs: 02/22/2019: ALT 23; BUN 10; Creatinine, Ser 0.83; Hemoglobin 16.3; Platelets 97; Potassium 4.6; Sodium 139   Recent Lipid Panel Lab Results  Component Value Date/Time   CHOL 115 02/22/2019 09:28 AM   CHOL 173 11/02/2015 12:00 AM   TRIG 74 02/22/2019 09:28 AM   TRIG 69 11/02/2015 12:00 AM   HDL 56 02/22/2019 09:28 AM   CHOLHDL 2.1 02/22/2019 09:28 AM   CHOLHDL 3.1 11/02/2015 12:00 AM   LDLCALC 44 02/22/2019 09:28 AM   LDLCALC 104 (A) 11/02/2015 12:00 AM    Wt Readings from Last 3 Encounters:  08/25/19 187 lb 6.4 oz (85 kg)  02/15/19 189 lb (85.7 kg)  08/25/18 186 lb (84.4 kg)     Objective:    Vital Signs:  BP (!) 150/92   Pulse 67   Ht 5' 9.5" (1.765 m)   Wt 187 lb 6.4 oz (85 kg)   BMI 27.28 kg/m    GEN: Well nourished, well developed in no acute distress HEENT: Normal NECK: No JVD; No carotid bruits LYMPHATICS: No lymphadenopathy CARDIAC:RRR, no murmurs, rubs, gallops RESPIRATORY:  Clear to auscultation without rales, wheezing or rhonchi  ABDOMEN: Soft, non-tender, non-distended MUSCULOSKELETAL:  No edema; No deformity  SKIN: Warm and dry NEUROLOGIC:  Alert and oriented x 3 PSYCHIATRIC:  Normal affect   EKG done in office and reviewed showing NSR with inferolateral T wave abnormality  ASSESSMENT & PLAN:     1.  OSA -  The patient is tolerating PAP  therapy well without any problems. The PAP download was reviewed today and showed an AHI of 0.7/hr on auto PAP with 97% compliance in using more than 4 hours nightly.  The patient has been using and benefiting from PAP use and will continue to benefit from therapy.   2.  Bicuspid AV with severe AS -s/p  AVR with a 23 mm [employer] magna ease AVR 06/2017 -2D echo 11/2018 showed a stable AVR with mean aortic valve gradient essentially unchanged at 13 mmHg  -continue ASA -continue ongoing SBE prophylaxis for dental procedures  3.  ASCAD  -cath 2019 showed severe 2 vessel CAD  -s/p CABG x 2 utilizing LIMA to LAD, and SVG to distal PDA.   -he has continued to have unexplained chest pain ever since his heart surgery -this has not gotten any worse and is about the same but is very frequent -it is nonexertional and occurs as much during the night as during the day -there is no positional component and not worse with deep breathing -feels like a cramp in his chest but radiates into his neck and arms -no associated sx of Nausea or diaphoresis and not worse with palpation of the chest wall -I do not have an explanation for the CP.  His EKG shows inferolateral T wave changes unchanged from prior to surgery -I will check an hsCRP and Sed rate.  Recent echo showed no pericardial effusion and no thickening of the pericardium -I will get a Lexiscan myoview to rule out ischemia -may need to consider chest CT -continue ASA and statin    4.  Dilated ascending aorta  - this is stable at 39 mm by echo 07/25/2017 and 61mm by echo 11/2018 -repeat echo 11/2019 -BP controlled -continue statin .   5.  Hyperlipidemia -LDL goal < 70.  -LDL was 44 in Nov  2020 -continue atorvastatin 80mg  daily  6.  HTN -BP borderline controlled on exam but earlier today it was 128/50mmHg - he says it goes up at the MD office -he has not required antihypertensive therapy   Medication Adjustments/Labs and Tests Ordered: Current  medicines are reviewed at length with the patient today.  Concerns regarding medicines are outlined above.  Tests Ordered: Orders Placed This Encounter  Procedures  . EKG 12-Lead   Medication Changes: No orders of the defined types were placed in this encounter.   Disposition:  Follow up in 1 year(s)  Signed, [name], MD  08/25/2019 10:50 AM    [place]

## 2019-08-25 ENCOUNTER — Other Ambulatory Visit: Payer: Self-pay

## 2019-08-25 ENCOUNTER — Ambulatory Visit: Payer: BC Managed Care – PPO | Admitting: Cardiology

## 2019-08-25 ENCOUNTER — Encounter: Payer: Self-pay | Admitting: Cardiology

## 2019-08-25 VITALS — BP 150/92 | HR 67 | Ht 69.5 in | Wt 187.4 lb

## 2019-08-25 DIAGNOSIS — I7121 Aneurysm of the ascending aorta, without rupture: Secondary | ICD-10-CM

## 2019-08-25 DIAGNOSIS — G4733 Obstructive sleep apnea (adult) (pediatric): Secondary | ICD-10-CM

## 2019-08-25 DIAGNOSIS — I2510 Atherosclerotic heart disease of native coronary artery without angina pectoris: Secondary | ICD-10-CM | POA: Diagnosis not present

## 2019-08-25 DIAGNOSIS — I350 Nonrheumatic aortic (valve) stenosis: Secondary | ICD-10-CM | POA: Diagnosis not present

## 2019-08-25 DIAGNOSIS — I712 Thoracic aortic aneurysm, without rupture: Secondary | ICD-10-CM

## 2019-08-25 DIAGNOSIS — I10 Essential (primary) hypertension: Secondary | ICD-10-CM

## 2019-08-25 DIAGNOSIS — E7800 Pure hypercholesterolemia, unspecified: Secondary | ICD-10-CM

## 2019-08-25 NOTE — Patient Instructions (Signed)
Medication Instructions:  Your physician recommends that you continue on your current medications as directed. Please refer to the Current Medication list given to you today.  *If you need a refill on your cardiac medications before your next appointment, please call your pharmacy*  Lab Work: TODAY: ESR, CRP If you have labs (blood work) drawn today and your tests are completely normal, you will receive your results only by: Marland Kitchen MyChart Message (if you have MyChart) OR . A paper copy in the mail If you have any lab test that is abnormal or we need to change your treatment, we will call you to review the results.   Testing/Procedures: Your physician has requested that you have a lexiscan myoview. For further information please visit [email or web address]. Please follow instruction sheet, as given.  Follow-Up: At [facility], you and your health needs are our priority.  As part of our continuing mission to provide you with exceptional heart care, we have created designated Provider Care Teams.  These Care Teams include your primary Cardiologist (physician) and Advanced Practice Providers (APPs -  Physician Assistants and Nurse Practitioners) who all work together to provide you with the care you need, when you need it.   Your next appointment:   1 year(s)  The format for your next appointment:   In Person  Provider:   You may see [name], MD or one of the following Advanced Practice Providers on your designated Care Team:    Melina Copa, PA-C  Ermalinda Barrios, PA-C

## 2019-08-26 LAB — HIGH SENSITIVITY CRP: CRP, High Sensitivity: 0.29 mg/L (ref 0.00–3.00)

## 2019-08-26 LAB — SEDIMENTATION RATE: Sed Rate: 2 mm/hr (ref 0–30)

## 2019-08-30 ENCOUNTER — Encounter (HOSPITAL_COMMUNITY): Payer: Self-pay

## 2019-08-30 ENCOUNTER — Telehealth (HOSPITAL_COMMUNITY): Payer: Self-pay

## 2019-08-30 NOTE — Telephone Encounter (Signed)
Detailed instructions left on the patient's answering machine. Asked to call back with any questions. My chart letter was also sent. S.Williams EMTP

## 2019-09-01 ENCOUNTER — Ambulatory Visit (HOSPITAL_COMMUNITY): Payer: BC Managed Care – PPO | Attending: Cardiology

## 2019-09-01 ENCOUNTER — Other Ambulatory Visit: Payer: Self-pay

## 2019-09-01 DIAGNOSIS — I712 Thoracic aortic aneurysm, without rupture: Secondary | ICD-10-CM | POA: Insufficient documentation

## 2019-09-01 DIAGNOSIS — I2510 Atherosclerotic heart disease of native coronary artery without angina pectoris: Secondary | ICD-10-CM

## 2019-09-01 DIAGNOSIS — G4733 Obstructive sleep apnea (adult) (pediatric): Secondary | ICD-10-CM

## 2019-09-01 DIAGNOSIS — I7121 Aneurysm of the ascending aorta, without rupture: Secondary | ICD-10-CM

## 2019-09-01 DIAGNOSIS — E7800 Pure hypercholesterolemia, unspecified: Secondary | ICD-10-CM | POA: Diagnosis present

## 2019-09-01 DIAGNOSIS — I10 Essential (primary) hypertension: Secondary | ICD-10-CM

## 2019-09-01 DIAGNOSIS — I350 Nonrheumatic aortic (valve) stenosis: Secondary | ICD-10-CM | POA: Diagnosis present

## 2019-09-01 LAB — MYOCARDIAL PERFUSION IMAGING
LV dias vol: 151 mL (ref 62–150)
LV sys vol: 77 mL
Peak HR: 101 {beats}/min
Rest HR: 57 {beats}/min
SDS: 4
SRS: 4
SSS: 9
TID: 1.11

## 2019-09-01 MED ORDER — TECHNETIUM TC 99M TETROFOSMIN IV KIT
10.5000 | PACK | Freq: Once | INTRAVENOUS | Status: AC | PRN
  Administered 2019-09-01: 10.5 via INTRAVENOUS
  Filled 2019-09-01: qty 11

## 2019-09-01 MED ORDER — TECHNETIUM TC 99M TETROFOSMIN IV KIT
31.7000 | PACK | Freq: Once | INTRAVENOUS | Status: AC | PRN
  Administered 2019-09-01: 31.7 via INTRAVENOUS
  Filled 2019-09-01: qty 32

## 2019-09-01 MED ORDER — REGADENOSON 0.4 MG/5ML IV SOLN
0.4000 mg | Freq: Once | INTRAVENOUS | Status: AC
  Administered 2019-09-01: 0.4 mg via INTRAVENOUS

## 2019-09-06 ENCOUNTER — Other Ambulatory Visit: Payer: Self-pay

## 2019-09-06 DIAGNOSIS — I7121 Aneurysm of the ascending aorta, without rupture: Secondary | ICD-10-CM

## 2019-09-06 DIAGNOSIS — I712 Thoracic aortic aneurysm, without rupture: Secondary | ICD-10-CM

## 2019-11-03 ENCOUNTER — Other Ambulatory Visit: Payer: Self-pay | Admitting: Cardiology

## 2019-11-09 ENCOUNTER — Telehealth: Payer: Self-pay

## 2019-11-09 ENCOUNTER — Ambulatory Visit (INDEPENDENT_AMBULATORY_CARE_PROVIDER_SITE_OTHER): Payer: BC Managed Care – PPO

## 2019-11-09 ENCOUNTER — Other Ambulatory Visit: Payer: Self-pay

## 2019-11-09 ENCOUNTER — Encounter: Payer: Self-pay | Admitting: Cardiology

## 2019-11-09 DIAGNOSIS — I7121 Aneurysm of the ascending aorta, without rupture: Secondary | ICD-10-CM

## 2019-11-09 DIAGNOSIS — I712 Thoracic aortic aneurysm, without rupture: Secondary | ICD-10-CM

## 2019-11-09 LAB — ECHOCARDIOGRAM LIMITED
AR max vel: 1.34 cm2
AV Area VTI: 1.49 cm2
AV Area mean vel: 1.14 cm2
AV Mean grad: 22 mmHg
AV Peak grad: 56 mmHg
Ao pk vel: 3.74 m/s
S' Lateral: 3.2 cm

## 2019-11-09 NOTE — Telephone Encounter (Signed)
-----   Message from [name], MD sent at 11/09/2019  1:36 PM EDT ----- 2D echo showed normal heart function with mildly thickened heart muscle, trivial leakiness of MV and stable AVR and mildly dilated ascending aorta at 33mm which is only minimally increased.  Repeat limited echo in 1 year for dilated aorta

## 2019-11-09 NOTE — Telephone Encounter (Signed)
The patient has been notified of the result and verbalized understanding.  All questions (if any) were answered. Orders placed for limited echo to be done in 1 year.  Antonieta Iba, RN 11/09/2019 2:07 PM

## 2020-01-16 ENCOUNTER — Encounter: Payer: Self-pay | Admitting: Family Medicine

## 2020-02-10 ENCOUNTER — Other Ambulatory Visit: Payer: Self-pay | Admitting: *Deleted

## 2020-02-10 DIAGNOSIS — Z125 Encounter for screening for malignant neoplasm of prostate: Secondary | ICD-10-CM

## 2020-02-10 DIAGNOSIS — R351 Nocturia: Secondary | ICD-10-CM

## 2020-02-10 DIAGNOSIS — I10 Essential (primary) hypertension: Secondary | ICD-10-CM

## 2020-02-10 DIAGNOSIS — E559 Vitamin D deficiency, unspecified: Secondary | ICD-10-CM

## 2020-02-10 DIAGNOSIS — I2510 Atherosclerotic heart disease of native coronary artery without angina pectoris: Secondary | ICD-10-CM

## 2020-02-10 DIAGNOSIS — D696 Thrombocytopenia, unspecified: Secondary | ICD-10-CM

## 2020-02-10 DIAGNOSIS — Z0000 Encounter for general adult medical examination without abnormal findings: Secondary | ICD-10-CM

## 2020-02-10 DIAGNOSIS — R7309 Other abnormal glucose: Secondary | ICD-10-CM

## 2020-02-10 DIAGNOSIS — E7800 Pure hypercholesterolemia, unspecified: Secondary | ICD-10-CM

## 2020-02-13 ENCOUNTER — Other Ambulatory Visit: Payer: Self-pay

## 2020-02-13 ENCOUNTER — Other Ambulatory Visit: Payer: Medicare PPO

## 2020-02-14 LAB — CBC WITH DIFFERENTIAL/PLATELET
Absolute Monocytes: 448 cells/uL (ref 200–950)
Basophils Absolute: 59 cells/uL (ref 0–200)
Basophils Relative: 1.6 %
Eosinophils Absolute: 159 cells/uL (ref 15–500)
Eosinophils Relative: 4.3 %
HCT: 47.6 % (ref 38.5–50.0)
Hemoglobin: 16 g/dL (ref 13.2–17.1)
Lymphs Abs: 777 cells/uL — ABNORMAL LOW (ref 850–3900)
MCH: 31.5 pg (ref 27.0–33.0)
MCHC: 33.6 g/dL (ref 32.0–36.0)
MCV: 93.7 fL (ref 80.0–100.0)
MPV: 12.9 fL — ABNORMAL HIGH (ref 7.5–12.5)
Monocytes Relative: 12.1 %
Neutro Abs: 2257 cells/uL (ref 1500–7800)
Neutrophils Relative %: 61 %
Platelets: 113 10*3/uL — ABNORMAL LOW (ref 140–400)
RBC: 5.08 10*6/uL (ref 4.20–5.80)
RDW: 12.2 % (ref 11.0–15.0)
Total Lymphocyte: 21 %
WBC: 3.7 10*3/uL — ABNORMAL LOW (ref 3.8–10.8)

## 2020-02-14 LAB — LIPID PANEL
Cholesterol: 110 mg/dL (ref <200)
HDL: 55 mg/dL (ref >=40)
LDL Cholesterol (Calc): 39 mg/dL (calc)
Non-HDL Cholesterol (Calc): 55 mg/dL (calc) (ref <130)
Total CHOL/HDL Ratio: 2 (calc) (ref <5.0)
Triglycerides: 82 mg/dL (ref <150)

## 2020-02-14 LAB — COMPLETE METABOLIC PANEL WITH GFR
AG Ratio: 2.3 (calc) (ref 1.0–2.5)
ALT: 25 U/L (ref 9–46)
AST: 34 U/L (ref 10–35)
Albumin: 4.5 g/dL (ref 3.6–5.1)
Alkaline phosphatase (APISO): 90 U/L (ref 35–144)
BUN: 12 mg/dL (ref 7–25)
CO2: 26 mmol/L (ref 20–32)
Calcium: 9.6 mg/dL (ref 8.6–10.3)
Chloride: 105 mmol/L (ref 98–110)
Creat: 0.81 mg/dL (ref 0.70–1.25)
GFR, Est African American: 108 mL/min/{1.73_m2} (ref >=60)
GFR, Est Non African American: 93 mL/min/{1.73_m2} (ref >=60)
Globulin: 2 g/dL (calc) (ref 1.9–3.7)
Glucose, Bld: 88 mg/dL (ref 65–99)
Potassium: 4.3 mmol/L (ref 3.5–5.3)
Sodium: 140 mmol/L (ref 135–146)
Total Bilirubin: 1.8 mg/dL — ABNORMAL HIGH (ref 0.2–1.2)
Total Protein: 6.5 g/dL (ref 6.1–8.1)

## 2020-02-14 LAB — HEMOGLOBIN A1C
Hgb A1c MFr Bld: 5.1 % of total Hgb (ref <5.7)
Mean Plasma Glucose: 100 (calc)
eAG (mmol/L): 5.5 (calc)

## 2020-02-14 LAB — PSA: PSA: 0.86 ng/mL (ref <=4.0)

## 2020-02-14 LAB — VITAMIN D 25 HYDROXY (VIT D DEFICIENCY, FRACTURES): Vit D, 25-Hydroxy: 29 ng/mL — ABNORMAL LOW (ref 30–100)

## 2020-02-17 ENCOUNTER — Other Ambulatory Visit: Payer: Self-pay

## 2020-02-17 ENCOUNTER — Other Ambulatory Visit: Payer: Self-pay | Admitting: Family Medicine

## 2020-02-17 ENCOUNTER — Encounter: Payer: Self-pay | Admitting: Family Medicine

## 2020-02-17 ENCOUNTER — Ambulatory Visit (INDEPENDENT_AMBULATORY_CARE_PROVIDER_SITE_OTHER): Payer: Medicare PPO | Admitting: Family Medicine

## 2020-02-17 VITALS — BP 125/74 | HR 78 | Temp 97.5°F | Resp 16 | Ht 70.0 in | Wt 188.0 lb

## 2020-02-17 DIAGNOSIS — Z9989 Dependence on other enabling machines and devices: Secondary | ICD-10-CM

## 2020-02-17 DIAGNOSIS — R17 Unspecified jaundice: Secondary | ICD-10-CM

## 2020-02-17 DIAGNOSIS — Z951 Presence of aortocoronary bypass graft: Secondary | ICD-10-CM

## 2020-02-17 DIAGNOSIS — T50905A Adverse effect of unspecified drugs, medicaments and biological substances, initial encounter: Secondary | ICD-10-CM

## 2020-02-17 DIAGNOSIS — Z0000 Encounter for general adult medical examination without abnormal findings: Secondary | ICD-10-CM

## 2020-02-17 DIAGNOSIS — E559 Vitamin D deficiency, unspecified: Secondary | ICD-10-CM

## 2020-02-17 DIAGNOSIS — I10 Essential (primary) hypertension: Secondary | ICD-10-CM

## 2020-02-17 DIAGNOSIS — Z953 Presence of xenogenic heart valve: Secondary | ICD-10-CM

## 2020-02-17 DIAGNOSIS — G4733 Obstructive sleep apnea (adult) (pediatric): Secondary | ICD-10-CM

## 2020-02-17 DIAGNOSIS — F411 Generalized anxiety disorder: Secondary | ICD-10-CM | POA: Insufficient documentation

## 2020-02-17 DIAGNOSIS — I2510 Atherosclerotic heart disease of native coronary artery without angina pectoris: Secondary | ICD-10-CM

## 2020-02-17 DIAGNOSIS — D696 Thrombocytopenia, unspecified: Secondary | ICD-10-CM

## 2020-02-17 DIAGNOSIS — R001 Bradycardia, unspecified: Secondary | ICD-10-CM

## 2020-02-17 DIAGNOSIS — L600 Ingrowing nail: Secondary | ICD-10-CM

## 2020-02-17 NOTE — Assessment & Plan Note (Addendum)
Stable without angina Followed by [facility] Dr Radford Pax / Cardiothoracic Surgery S/p CABG AVR Continues on medication management - ASA 81, Statin

## 2020-02-17 NOTE — Telephone Encounter (Signed)
Discussed at visit  Nobie Putnam, [facility] 02/17/2020, 12:47 PM

## 2020-02-17 NOTE — Progress Notes (Signed)
Subjective:    Patient ID: WHEELER INCORVAIA, male    DOB: [birth year], 64 y.o.   MRN: [id number]  Todd Frank is a 64 y.o. male presenting on 02/17/2020 for Annual Exam   HPI  Here for Annual Physical and Lab Review.  Lifestyle/ BMI >26 - Overall doing well with lifestyle, some weight loss, some fluctuation - He continues balanced diet, limited without gym right now  Thrombocytopenia, chronic Last lab showed mild low 113 plt. Previous history of frequently low PLT - peripheral smear was giant platelet and clumping. No new concerns Denies bleeding or bruising  CHRONIC HTN: Reports no recent concerns, remains well controlled, checks BP regularly at home. Current Meds -off meds Off BB metoprolol due to bradycardia  CAD, Severe Aortic Stenosis s/p AVR Still feels like he is recovering from cardiac surgery, now 1.5 years, he still exercises regularly, occasionally has some soreness but no pain. He has avoided heavy lifting using chest. - Followed by [facility] Cardiology (Dr Tressia Miners [name])/ Cardiothoracic Surgery Dr Prescott Gum S/p AVR CABG. He has done well. Completed cardiac rehab. - Last ECHO 11/2019, will continue to follow - On ASA 40m daily recently seen, he is doing well and exercising, walking regularly daily, he maintains HR with exercise  HYPERLIPIDEMIA: - Followed by Cardiology, has been on high dose lipitor with Atorvastatin 857mdaily, goal LDL < 70 Last lab was controlled LDL < 40. HDL normal.  History of Dermatology [facility], Dr KoNehemiah Massedrior malignant melanoma removed. AKs frozen in past   Follow-up Dizziness/Lightheadedness / Seasonal Allergies / Sinusitis  He has researched Chronic Subjective Dizziness (CSD) he admits multiple life stressors major life events losses in family, he was diagnosed with Generalized Anxiety Disorder (GAD) and has now seen a therapist. He has had some improvement in coping skills with CBT he is less provoked by anxiety  that physical symptoms are related to his "heart" from history of valve repair etc, he has been worried about this often when he has symptoms. He feels overall the dizziness has improved vs he feels maybe he is coping with it better. - he has seen ENT multiple visits, [facility] no cause of dizziness identified  OSA on CPAP He is doing well. Controlled on auto CPAP. Using nightly. No concerns.  Elevated T Bilirubin On last lab, recent readings in past year mild elevated 1.3 to 1.4 now higher 1.8 Denies jaundice  Health Maintenance:  He will opt to hold Prevnar13 today, will return for vaccine when ready or can get new updated Prevnar20  UTD Shingrix  -Colon CA Screening:UTD - last screen w/ cologuard 03/22/17 (negative) next due in 03/2020 -Last Colon[facility] done >10 years ago approx [facility] with reported normal next due 10 years, 11/2016. Currently asymptomatic. No known family history of colon CA - DUE for next Cologuard or colon CA screening after 03/22/20 - 3 years, he prefers Cologuard, instead of colonoscopy, he will message [place] ready  - UTD Flu vaccine10/2021  - UTD routine Hep C and HIV screen  - UTD TDap  -Prostate CA Screening:Last prostateCA screening PSA 0.86 (02/2020) previous around 1, prior DREreported normal. Currently asymptomatic.No known family history of prostate CA  [facility] 02/17/2020 02/15/2019 02/12/2018  Decreased Interest 0 0 0  Down, Depressed, Hopeless 0 0 0  PHQ - 2 Score 0 0 0  Altered sleeping - 0 -  Tired, decreased energy - 0 -  Change in appetite -  0 -  Feeling bad or failure about yourself  - 0 -  Trouble concentrating - 0 -  Moving slowly or fidgety/restless - 0 -  Suicidal thoughts - 0 -  PHQ-9 Score - 0 -  Difficult doing work/chores - - -    Past Medical History:  Diagnosis Date  . Aortic stenosis    a. syncope/severe AS s/p bioprosthetic AVR 06/2017.  . Arthritis   . Ascending  aorta dilatation (HCC)    35mm by echo 11/2019  . Bicuspid aortic valve   . Bradycardia    secondary to BB  . Broken clavicle    as child, can still dislocate at times  . CAD (coronary artery disease), native coronary artery    a. s/p 2V CABG at time of AVR 06/2017.  . Carotid bruit    carotid dopplers negative.  Bruit due to heart murmur from AS  . Congenital nevus   . Diastolic dysfunction   . GERD (gastroesophageal reflux disease)   . Headache    optical migraine  . Hx of dysplastic nevus 2010   multiple sites   . Hx of melanoma in situ 2011   Left superior thigh   . Hyperlipidemia    LDL goal < 70  . LVH (left ventricular hypertrophy)   . Melanoma of skin ([place])   . Obesity   . PFO (patent foramen ovale)    small by echo 2014, but not demonstrated during intra-op note from CABG/AVR 06/2017.   Past Surgical History:  Procedure Laterality Date  . AORTIC VALVE REPLACEMENT N/A 06/25/2017   Procedure: AORTIC VALVE REPLACEMENT (AVR) using Magna Ease Aortic Valve size 75mm;  Surgeon: Ivin Poot, MD;  Location: [place];  Service: Open Heart Surgery;  Laterality: N/A;  . arthritic cyst removal  2017   from Lumbar 4-5, dr pool  . CARDIAC CATHETERIZATION N/A 02/20/2016   Procedure: Right/Left Heart Cath and Coronary Angiography;  Surgeon: Belva Crome, MD;  Location: [place] [facility];  Service: Cardiovascular;  Laterality: N/A;  . CORONARY ARTERY BYPASS GRAFT N/A 06/25/2017   Procedure: CORONARY ARTERY BYPASS GRAFTING (CABG) x Two , using left internal mammary artery and right leg greater saphenous vein harvested endoscopically;  Surgeon: Ivin Poot, MD;  Location: [place];  Service: Open Heart Surgery;  Laterality: N/A;  . CORONARY/GRAFT ANGIOGRAPHY N/A 06/02/2017   Procedure: CORONARY/GRAFT ANGIOGRAPHY;  Surgeon: Nelva Bush, MD;  Location: [place] [facility];  Service: Cardiovascular;  Laterality: N/A;  . EYE SURGERY    . MELANOMA EXCISION  2011   left thigh  . RIGHT  HEART CATH N/A 06/02/2017   Procedure: RIGHT HEART CATH;  Surgeon: Nelva Bush, MD;  Location: [place] [facility];  Service: Cardiovascular;  Laterality: N/A;  . TEE WITHOUT CARDIOVERSION N/A 06/25/2017   Procedure: TRANSESOPHAGEAL ECHOCARDIOGRAM (TEE);  Surgeon: Prescott Gum, Collier Salina, MD;  Location: [place];  Service: Open Heart Surgery;  Laterality: N/A;  . TONSILLECTOMY     Social History   Socioeconomic History  . Marital status: Married    Spouse name: [name]  . Number of children: 0  . Years of education: Assoc degree  . Highest education level: Not on file  Occupational History  . Occupation: Government social research officer - [employer]  Tobacco Use  . Smoking status: Former Smoker    Packs/day: 1.00    Quit date: 04/07/1976    Years since quitting: 43.8  . Smokeless tobacco: Former Systems developer  . Tobacco comment: quit  in 29, teenager  Vaping Use  . Vaping Use: Never used  Substance and Sexual Activity  . Alcohol use: Yes    Alcohol/week: 1.0 standard drink    Types: 1 Cans of beer per week    Comment: occasional  . Drug use: No    Comment: quit marijuana in 1990  . Sexual activity: Yes  Other Topics Concern  . Not on file  Social History Narrative   Lives with wife   Caffeine < 1 cup daily   Social Determinants of Health   Financial Resource Strain:   . Difficulty of Paying Living Expenses: Not on file  Food Insecurity:   . Worried About Charity fundraiser in the Last Year: Not on file  . Ran Out of Food in the Last Year: Not on file  Transportation Needs:   . Lack of Transportation (Medical): Not on file  . Lack of Transportation (Non-Medical): Not on file  Physical Activity:   . Days of Exercise per Week: Not on file  . Minutes of Exercise per Session: Not on file  Stress:   . Feeling of Stress : Not on file  Social Connections:   . Frequency of Communication with Friends and Family: Not on file  . Frequency of Social Gatherings with Friends and Family: Not on file  . Attends  Religious Services: Not on file  . Active Member of Clubs or Organizations: Not on file  . Attends Archivist Meetings: Not on file  . Marital Status: Not on file  Intimate Partner Violence:   . Fear of Current or Ex-Partner: Not on file  . Emotionally Abused: Not on file  . Physically Abused: Not on file  . Sexually Abused: Not on file   Family History  Problem Relation Age of Onset  . Diabetes Mother   . Heart disease Mother   . Arthritis Father   . Arthritis Sister   . Heart disease Paternal Grandmother        MI  . Heart attack Other        family history  . Pancreatic cancer Maternal Aunt    Current Outpatient Medications on File Prior to Visit  Medication Sig  . aspirin EC 81 MG tablet Take 81 mg by mouth daily.  Marland Kitchen atorvastatin (LIPITOR) 80 MG tablet TAKE 1 TABLET BY MOUTH DAILY AT 06:00PM  . azelastine (ASTELIN) 0.1 % nasal spray Place 1 spray into both nostrils 2 (two) times daily. Use in each nostril as directed  . cholecalciferol (VITAMIN D) 1000 units tablet Take 1,000 Units by mouth 2 (two) times daily.  . Coenzyme Q10 (COQ-10) 200 MG CAPS Take 200 mg by mouth daily.  . fluticasone (FLONASE) 50 MCG/ACT nasal spray Place 2 sprays into both nostrils daily as needed (for allergies.).   No current facility-administered medications on file prior to visit.    Review of Systems  Constitutional: Negative for activity change, appetite change, chills, diaphoresis, fatigue and fever.  HENT: Negative for congestion and hearing loss.   Eyes: Negative for visual disturbance.  Respiratory: Negative for cough, chest tightness, shortness of breath and wheezing.   Cardiovascular: Negative for chest pain, palpitations and leg swelling.  Gastrointestinal: Negative for abdominal pain, constipation, diarrhea, nausea and vomiting.  Endocrine: Negative for cold intolerance.  Genitourinary: Negative for dysuria, frequency and hematuria.  Musculoskeletal: Negative for  arthralgias, back pain and neck pain.  Skin: Negative for rash.  Allergic/Immunologic: Negative for environmental allergies.  Neurological: Positive  for dizziness. Negative for weakness, light-headedness, numbness and headaches.  Hematological: Negative for adenopathy.  Psychiatric/Behavioral: Negative for behavioral problems, dysphoric mood and sleep disturbance. The patient is not nervous/anxious.    Per HPI unless specifically indicated above      Objective:    BP 125/74   Pulse 78   Temp (!) 97.5 F (36.4 C) (Temporal)   Resp 16   Ht 5' 10"  (1.778 m)   Wt 188 lb (85.3 kg)   SpO2 98%   BMI 26.98 kg/m   Wt Readings from Last 3 Encounters:  02/17/20 188 lb (85.3 kg)  09/01/19 187 lb (84.8 kg)  08/25/19 187 lb 6.4 oz (85 kg)    Physical Exam Vitals and nursing note reviewed.  Constitutional:      General: He is not in acute distress.    Appearance: He is well-developed. He is not diaphoretic.     Comments: Well-appearing, comfortable, cooperative  HENT:     Head: Normocephalic and atraumatic.  Eyes:     General:        Right eye: No discharge.        Left eye: No discharge.     Conjunctiva/sclera: Conjunctivae normal.     Pupils: Pupils are equal, round, and reactive to light.  Neck:     Thyroid: No thyromegaly.     Vascular: Carotid bruit present.  Cardiovascular:     Rate and Rhythm: Normal rate and regular rhythm.     Heart sounds: Murmur (stable 2/6 radiates to carotid, s/p valve) heard.   Pulmonary:     Effort: Pulmonary effort is normal. No respiratory distress.     Breath sounds: Normal breath sounds. No wheezing or rales.  Abdominal:     General: Bowel sounds are normal. There is no distension.     Palpations: Abdomen is soft. There is no mass.     Tenderness: There is no abdominal tenderness.  Genitourinary:    Comments: External rectal exam - shows R sided 3 o clock mildly swollen but not inflamed external hemorrhoid some extension internally. No  other abnormality. No anal fissure. No internal exam done today Musculoskeletal:        General: No tenderness. Normal range of motion.     Cervical back: Normal range of motion and neck supple.     Right lower leg: No edema.     Left lower leg: No edema.     Comments: Upper / Lower Extremities: - Normal muscle tone, strength bilateral upper extremities 5/5, lower extremities 5/5  Lymphadenopathy:     Cervical: No cervical adenopathy.  Skin:    General: Skin is warm and dry.     Findings: No erythema or rash.  Neurological:     Mental Status: He is alert and oriented to person, place, and time.     Comments: Distal sensation intact to light touch all extremities  Psychiatric:        Behavior: Behavior normal.     Comments: Well groomed, good eye contact, normal speech and thoughts       ECHOCARDIOGRAM LIMITED REPORT       Patient Name:  DEQUON SCHNEBLY Date of Exam: 11/09/2019  Medical Rec #: [id number]   Height:    69.5 in  Accession #:  [id number]   Weight:    187.0 lb  Date of Birth: December 02, 1955   BSA:     2.018 m  Patient Age:  78 years    BP:  138/80 mmHg  Patient Gender: M       HR:      68 bpm.  Exam Location: [place]   Procedure: Limited Echo and Limited Color Doppler   Indications:  I71.2 Ascending aortic aneurysm    History:    Patient has prior history of Echocardiogram examinations,  most         recent 11/11/2018. CAD, Signs/Symptoms:Syncope; Risk  Factors:Sleep         Apnea, Hypertension and Former Smoker.         Aortic Valve: 23 mm Edwards pericardial valve is present  in the         aortic position.    Sonographer:  Pilar Jarvis RDMS, RVT, RDCS  Referring Phys: [id number] TRACI R [name]     Sonographer Comments: As per the ordering provider, this was a limited TTE  to assess an ascending aorta aneurysm  IMPRESSIONS    1. Left ventricular ejection  fraction, by estimation, is 55 to 60%. The  left ventricle has normal function. The left ventricle has no regional  wall motion abnormalities. There is mild left ventricular hypertrophy.  2. Right ventricular systolic function is normal. The right ventricular  size is normal.  3. The mitral valve is normal in structure. Trivial mitral valve  regurgitation.  4. The aortic valve has been repaired/replaced. Aortic valve  regurgitation is not visualized. There is a 23 mm Edwards pericardial  valve present in the aortic position. Echo findings are consistent with  normal structure and function of the aortic valve  prosthesis.  5. Aortic dilatation noted. There is mild dilatation of the ascending  aorta measuring 42 mm.   FINDINGS  Left Ventricle: Left ventricular ejection fraction, by estimation, is 55  to 60%. The left ventricle has normal function. The left ventricle has no  regional wall motion abnormalities. There is mild left ventricular  hypertrophy.   Right Ventricle: The right ventricular size is normal. Right ventricular  systolic function is normal.   Left Atrium: Left atrial size was normal in size.   Mitral Valve: The mitral valve is normal in structure. Trivial mitral  valve regurgitation.   Tricuspid Valve: The tricuspid valve is grossly normal. Tricuspid valve  regurgitation is not demonstrated.   Aortic Valve: The aortic valve has been repaired/replaced. Aortic valve  regurgitation is not visualized. Aortic valve mean gradient measures 22.0  mmHg. Aortic valve peak gradient measures 56.0 mmHg. Aortic valve area, by  VTI measures 1.49 cm. There is a  23 mm Edwards pericardial valve present in the aortic position. Echo  findings are consistent with normal structure and function of the aortic  valve prosthesis.   Aorta: Aortic dilatation noted. There is mild dilatation of the ascending  aorta measuring 42 mm.   Venous: The inferior vena cava was not well  visualized.   LEFT VENTRICLE  PLAX 2D  LVIDd:     4.70 cm  LVIDs:     3.20 cm  LV PW:     1.40 cm  LV IVS:    1.50 cm  LVOT diam:   2.50 cm  LV SV:     95  LV SV Index:  47  LVOT Area:   4.91 cm     AORTIC VALVE  AV Area (Vmax):  1.34 cm  AV Area (Vmean):  1.14 cm  AV Area (VTI):   1.49 cm  AV Vmax:      374.00 cm/s  AV Vmean:  243.000 cm/s  AV VTI:      0.641 m  AV Peak Grad:   56.0 mmHg  AV Mean Grad:   22.0 mmHg  LVOT Vmax:     102.00 cm/s  LVOT Vmean:    56.400 cm/s  LVOT VTI:     0.194 m  LVOT/AV VTI ratio: 0.30    AORTA  Ao Root diam: 3.20 cm  Ao Asc diam: 4.15 cm  Ao Arch diam: 2.6 cm     SHUNTS  Systemic VTI: 0.19 m  Systemic Diam: 2.50 cm   Debbe Odea MD  Electronically signed by Debbe Odea MD  Signature Date/Time: 11/09/2019/1:34:04 PM     Results for orders placed or performed in visit on 02/10/20  [facility] - VITAMIN D 25 Hydroxy (Vit-D Deficiency, Fractures)  Result Value Ref Range   Vit D, 25-Hydroxy 29 (L) 30 - 100 ng/mL  [facility] - PSA physical  Result Value Ref Range   PSA 0.86 < OR = 4.0 ng/mL  [facility] - Lipid panel physical  Result Value Ref Range   Cholesterol 110 <200 mg/dL   HDL 55 > OR = 40 mg/dL   Triglycerides 82 <577 mg/dL   LDL Cholesterol (Calc) 39 mg/dL (calc)   Total CHOL/HDL Ratio 2.0 <5.0 (calc)   Non-HDL Cholesterol (Calc) 55 <561 mg/dL (calc)  [facility] - CMET w/ GFR CMP Complete Metabolic Panel physical  Result Value Ref Range   Glucose, Bld 88 65 - 99 mg/dL   BUN 12 7 - 25 mg/dL   Creat 9.71 8.56 - 9.26 mg/dL   GFR, Est Non African American 93 > OR = 60 mL/min/1.31m2   GFR, Est African American 108 > OR = 60 mL/min/1.10m2   BUN/Creatinine Ratio NOT APPLICABLE 6 - 22 (calc)   Sodium 140 135 - 146 mmol/L   Potassium 4.3 3.5 - 5.3 mmol/L   Chloride 105 98 - 110 mmol/L   CO2 26 20 - 32 mmol/L   Calcium 9.6 8.6 - 10.3 mg/dL   Total Protein 6.5  6.1 - 8.1 g/dL   Albumin 4.5 3.6 - 5.1 g/dL   Globulin 2.0 1.9 - 3.7 g/dL (calc)   AG Ratio 2.3 1.0 - 2.5 (calc)   Total Bilirubin 1.8 (H) 0.2 - 1.2 mg/dL   Alkaline phosphatase (APISO) 90 35 - 144 U/L   AST 34 10 - 35 U/L   ALT 25 9 - 46 U/L  [facility] - CBC with Differential/Platelet physical  Result Value Ref Range   WBC 3.7 (L) 3.8 - 10.8 Thousand/uL   RBC 5.08 4.20 - 5.80 Million/uL   Hemoglobin 16.0 13.2 - 17.1 g/dL   HCT 99.7 38 - 50 %   MCV 93.7 80.0 - 100.0 fL   MCH 31.5 27.0 - 33.0 pg   MCHC 33.6 32.0 - 36.0 g/dL   RDW 87.4 66.3 - 55.6 %   Platelets 113 (L) 140 - 400 Thousand/uL   MPV 12.9 (H) 7.5 - 12.5 fL   Neutro Abs 2,257 1,500 - 7,800 cells/uL   Lymphs Abs 777 (L) 850 - 3,900 cells/uL   Absolute Monocytes 448 200 - 950 cells/uL   Eosinophils Absolute 159 15.0 - 500.0 cells/uL   Basophils Absolute 59 0.0 - 200.0 cells/uL   Neutrophils Relative % 61 %   Total Lymphocyte 21.0 %   Monocytes Relative 12.1 %   Eosinophils Relative 4.3 %   Basophils Relative 1.6 %  [facility] - A1c LAB Hemoglobin A1C physical  Result Value  Ref Range   Hgb A1c MFr Bld 5.1 <5.7 % of total Hgb   Mean Plasma Glucose 100 (calc)   eAG (mmol/L) 5.5 (calc)      Assessment & Plan:   Problem List Items Addressed This Visit    Vitamin D deficiency   Thrombocytopenia (HCC)    Stable slightly low, without complication.      S/P CABG x 2   S/P aortic valve replacement with bioprosthetic valve   OSA on CPAP    Well controlled, chronic OSA on CPAP - Good adherence to CPAP nightly - Continue current CPAP therapy, patient seems to be benefiting from therapy       GAD (generalized anxiety disorder)    Underlying problem Followed by therapist now receiving CBT Not on medication      Essential hypertension    Well-controlled HTN - Home BP readings controlled  Complication CAD, AS s/p AVR CABG Followed by Cardiology Off BB metoprolol due to bradycardia  Plan:  1. No meds 2. Encourage  improved lifestyle - low sodium diet, regular exercise 3. Continue monitor BP outside office, bring readings to next visit, if persistently >140/90 or new symptoms notify office sooner 4. Follow-up 1 year for annual      CAD (coronary artery disease), native coronary artery    Stable without angina Followed by [facility] Cardiology Dr Radford Pax / Cardiothoracic Surgery S/p CABG AVR Continues on medication management - ASA 81, Statin      Bradycardia, drug induced    Other Visit Diagnoses    Annual physical exam    -  Primary      Updated Health Maintenance information Reviewed recent lab results with patient Encouraged improvement to lifestyle with diet and exercise - Goal of weight loss   Additionally Elevated T Bili Asymptomatic. No jaundice. Uncertain cause, has no other abnormal chemistry, normal albumin LFT Alk phos - reassuring less likely biliary cause, no recent imaging, will repeat lab with hepatic function panel now to check direct bilirubin to categorize it and then may offer abdominal ultrasound if I identified elevated direct bilirubin for biliary obstruction work up vs refer to GI otherwise  CSD Dizziness May be underlying cause with anxiety and perceived dizziness Improving gradual Reassurance, agree with this potential cause.  No orders of the defined types were placed in this encounter.   Follow up plan: Return in about 6 weeks (around 03/30/2020) for 4-6 weeks fasting lab only and virtual follow-up (may order Abd [place] before or after f/u apt).  Ordered Hepatic Fxn panel and INR through [facility] 02/17/2020, 8:55 AM

## 2020-02-17 NOTE — Assessment & Plan Note (Signed)
Underlying problem Followed by therapist now receiving CBT Not on medication

## 2020-02-17 NOTE — Assessment & Plan Note (Signed)
Well controlled, chronic OSA on CPAP - Good adherence to CPAP nightly - Continue current CPAP therapy, patient seems to be benefiting from therapy  

## 2020-02-17 NOTE — Assessment & Plan Note (Signed)
Stable slightly low, without complication. 

## 2020-02-17 NOTE — Assessment & Plan Note (Signed)
Well-controlled HTN - Home BP readings controlled  Complication CAD, AS s/p AVR CABG Followed by Cardiology Off BB metoprolol due to bradycardia  Plan:  1. No meds 2. Encourage improved lifestyle - low sodium diet, regular exercise 3. Continue monitor BP outside office, bring readings to next visit, if persistently >140/90 or new symptoms notify office sooner 4. Follow-up 1 year for annual 

## 2020-02-17 NOTE — Patient Instructions (Addendum)
Thank you for coming to the office today.  Typically Prevnar 13 - dose 1 at age 64, then Pneumovax 1 1 year later age 84, for pneumonia  Future approvals for Prevnar 20 are coming, unsure date.  Please send mychart message to request a cologuard on or after 03/22/20 - (last test done in 2018) - due every 3 years. We can place order once we hear from you. If not heard back    Follow instructions to collect sample, you may call the company for any help or questions, 24/7 telephone support at [phone].  Elevated Total Bilirubin - we can check blood panel within 1-2 months, and abdominal ultrasound.   Please schedule a Follow-up Appointment to: Return in about 6 weeks (around 03/30/2020) for 4-6 weeks fasting lab only and virtual follow-up after Ultrasound done.  If you have any other questions or concerns, please feel free to call the office or send a message through [place]. You may also schedule an earlier appointment if necessary.  Additionally, you may be receiving a survey about your experience at our office within a few days to 1 week by e-mail or mail. We value your feedback.  Nobie Putnam, DO [place]

## 2020-02-27 DIAGNOSIS — E7800 Pure hypercholesterolemia, unspecified: Secondary | ICD-10-CM

## 2020-02-27 DIAGNOSIS — R17 Unspecified jaundice: Secondary | ICD-10-CM

## 2020-02-27 DIAGNOSIS — D696 Thrombocytopenia, unspecified: Secondary | ICD-10-CM

## 2020-03-06 ENCOUNTER — Ambulatory Visit
Admission: RE | Admit: 2020-03-06 | Discharge: 2020-03-06 | Disposition: A | Discharge status: Home / Self Care | Payer: Medicare PPO | Source: Ambulatory Visit | Attending: Family Medicine | Admitting: Family Medicine

## 2020-03-06 ENCOUNTER — Other Ambulatory Visit: Payer: Self-pay

## 2020-03-06 DIAGNOSIS — R17 Unspecified jaundice: Secondary | ICD-10-CM

## 2020-03-06 DIAGNOSIS — E7800 Pure hypercholesterolemia, unspecified: Secondary | ICD-10-CM

## 2020-03-06 DIAGNOSIS — D696 Thrombocytopenia, unspecified: Secondary | ICD-10-CM | POA: Diagnosis present

## 2020-03-12 ENCOUNTER — Ambulatory Visit: Payer: Medicare PPO | Admitting: Podiatry

## 2020-03-12 ENCOUNTER — Encounter: Payer: Self-pay | Admitting: Podiatry

## 2020-03-12 ENCOUNTER — Other Ambulatory Visit: Payer: Self-pay

## 2020-03-12 DIAGNOSIS — B070 Plantar wart: Secondary | ICD-10-CM

## 2020-03-12 DIAGNOSIS — L600 Ingrowing nail: Secondary | ICD-10-CM

## 2020-03-12 DIAGNOSIS — L603 Nail dystrophy: Secondary | ICD-10-CM | POA: Diagnosis not present

## 2020-03-12 MED ORDER — NEOMYCIN-POLYMYXIN-HC 1 % OT SOLN: OTIC | 1 refills | Status: DC

## 2020-03-12 MED ORDER — FLUOROURACIL 5 % EX CREA: TOPICAL_CREAM | Freq: Two times a day (BID) | CUTANEOUS | 1 refills | Status: DC

## 2020-03-12 NOTE — Patient Instructions (Signed)

## 2020-03-12 NOTE — Progress Notes (Signed)
Subjective:  Patient ID: Todd Frank, male    DOB: [birth year],  MRN: [id number] HPI Chief Complaint  Patient presents with  . Toe Pain    Hallux right - medial border, intermittent ingrown x years, better today, toenail is brittle  . Plantar Warts    Plantar arch left - lesion x 3 months, no treatment  . New Patient (Initial Visit)    64 y.o. male presents with the above complaint.   ROS: Denies fever chills nausea vomiting muscle aches pains calf pain back pain chest pain shortness of breath.  History of melanoma left thigh  Past Medical History:  Diagnosis Date  . Aortic stenosis    a. syncope/severe AS s/p bioprosthetic AVR 06/2017.  . Arthritis   . Ascending aorta dilatation (HCC)    70mm by echo 11/2019  . Bicuspid aortic valve   . Bradycardia    secondary to BB  . Broken clavicle    as child, can still dislocate at times  . CAD (coronary artery disease), native coronary artery    a. s/p 2V CABG at time of AVR 06/2017.  . Carotid bruit    carotid dopplers negative.  Bruit due to heart murmur from AS  . Congenital nevus   . Diastolic dysfunction   . GERD (gastroesophageal reflux disease)   . Headache    optical migraine  . Hx of dysplastic nevus 2010   multiple sites   . Hx of melanoma in situ 2011   Left superior thigh   . Hyperlipidemia    LDL goal < 70  . LVH (left ventricular hypertrophy)   . Melanoma of skin ([place])   . Obesity   . PFO (patent foramen ovale)    small by echo 2014, but not demonstrated during intra-op note from CABG/AVR 06/2017.   Past Surgical History:  Procedure Laterality Date  . AORTIC VALVE REPLACEMENT N/A 06/25/2017   Procedure: AORTIC VALVE REPLACEMENT (AVR) using Magna Ease Aortic Valve size 20mm;  Surgeon: Ivin Poot, MD;  Location: [place];  Service: Open Heart Surgery;  Laterality: N/A;  . arthritic cyst removal  2017   from Lumbar 4-5, dr pool  . CARDIAC CATHETERIZATION N/A 02/20/2016   Procedure: Right/Left Heart Cath  and Coronary Angiography;  Surgeon: Belva Crome, MD;  Location: [place] [facility];  Service: Cardiovascular;  Laterality: N/A;  . CORONARY ARTERY BYPASS GRAFT N/A 06/25/2017   Procedure: CORONARY ARTERY BYPASS GRAFTING (CABG) x Two , using left internal mammary artery and right leg greater saphenous vein harvested endoscopically;  Surgeon: Ivin Poot, MD;  Location: [facility];  Service: Open Heart Surgery;  Laterality: N/A;  . CORONARY/GRAFT ANGIOGRAPHY N/A 06/02/2017   Procedure: CORONARY/GRAFT ANGIOGRAPHY;  Surgeon: Nelva Bush, MD;  Location: [facility];  Service: Cardiovascular;  Laterality: N/A;  . EYE SURGERY    . MELANOMA EXCISION  2011   left thigh  . RIGHT HEART CATH N/A 06/02/2017   Procedure: RIGHT HEART CATH;  Surgeon: Nelva Bush, MD;  Location: [place] [facility];  Service: Cardiovascular;  Laterality: N/A;  . TEE WITHOUT CARDIOVERSION N/A 06/25/2017   Procedure: TRANSESOPHAGEAL ECHOCARDIOGRAM (TEE);  Surgeon: Prescott Gum, Collier Salina, MD;  Location: [facility];  Service: Open Heart Surgery;  Laterality: N/A;  . TONSILLECTOMY      Current Outpatient Medications:  .  aspirin EC 81 MG tablet, Take 81 mg by mouth daily., Disp: , Rfl:  .  atorvastatin (LIPITOR) 80 MG tablet, TAKE 1  TABLET BY MOUTH DAILY AT 06:00PM, Disp: 90 tablet, Rfl: 2 .  azelastine (ASTELIN) 0.1 % nasal spray, Place 1 spray into both nostrils 2 (two) times daily. Use in each nostril as directed, Disp: 30 mL, Rfl: 2 .  cholecalciferol (VITAMIN D) 1000 units tablet, Take 1,000 Units by mouth 2 (two) times daily., Disp: , Rfl:  .  Coenzyme Q10 (COQ-10) 200 MG CAPS, Take 200 mg by mouth daily., Disp: , Rfl:  .  fluorouracil (EFUDEX) 5 % cream, Apply topically 2 (two) times daily., Disp: 40 g, Rfl: 1 .  fluticasone (FLONASE) 50 MCG/ACT nasal spray, Place 2 sprays into both nostrils daily as needed (for allergies.)., Disp: 48 g, Rfl: 3 .  NEOMYCIN-POLYMYXIN-HYDROCORTISONE (CORTISPORIN) 1 % SOLN OTIC solution,  Apply 1-2 drops to toe BID after soaking, Disp: 10 mL, Rfl: 1  No Known Allergies Review of Systems Objective:  There were no vitals filed for this visit.  General: Well developed, nourished, in no acute distress, alert and oriented x3   Dermatological: Skin is warm, dry and supple bilateral. Nails x 10 are well maintained; remaining integument appears unremarkable at this time. There are no open sores, no preulcerative lesions, no rash or signs of infection present.  Sharp incurvated nail margin tibial border hallux left with a hyperpigmented lesion beneath the margin as well as a thickened dystrophic margin tibial right.  Mildly tender on palpation.  Also has a small verrucoid lesion central plantar arch left thrombosed capillaries are visible skin lines circumvent the lesion.  Measures less than 5 mm in diameter.  No other lesions identified.  Vascular: Dorsalis Pedis artery and Posterior Tibial artery pedal pulses are 2/4 bilateral with immedate capillary fill time. Pedal hair growth present. No varicosities and no lower extremity edema present bilateral.   Neruologic: Grossly intact via light touch bilateral. Vibratory intact via tuning fork bilateral. Protective threshold with Semmes Wienstein monofilament intact to all pedal sites bilateral. Patellar and Achilles deep tendon reflexes 2+ bilateral. No Babinski or clonus noted bilateral.   Musculoskeletal: No gross boney pedal deformities bilateral. No pain, crepitus, or limitation noted with foot and ankle range of motion bilateral. Muscular strength 5/5 in all groups tested bilateral.  Gait: Unassisted, Nonantalgic.    Radiographs:  None taken  Assessment & Plan:   Assessment: Ingrown toenail hyperpigmented lesion medial aspect tibial border right hallux.  Verruca plantaris left foot.  Plan: Wrote a prescription for Efudex cream to be applied twice daily for the verruca.  This only applied under occlusion. Chemical matrixectomy  was performed today to the tibial border of the hallux right.  Tolerated procedure well without complications.  Tolerated this well after local anesthetic was administered.  We will follow-up with him.  Margin was sent for pathologic evaluation we will follow-up with him in 2 weeks.  He was provided with both oral and written home-going instructions as well as a prescription for Corticosporin otic to be applied twice daily after soaking.     Jonan Seufert T. [place], [place]

## 2020-03-13 ENCOUNTER — Other Ambulatory Visit: Payer: Medicare PPO

## 2020-03-13 DIAGNOSIS — R17 Unspecified jaundice: Secondary | ICD-10-CM

## 2020-03-14 LAB — HEPATIC FUNCTION PANEL
AG Ratio: 2.2 (calc) (ref 1.0–2.5)
ALT: 20 U/L (ref 9–46)
AST: 27 U/L (ref 10–35)
Albumin: 4.3 g/dL (ref 3.6–5.1)
Alkaline phosphatase (APISO): 86 U/L (ref 35–144)
Bilirubin, Direct: 0.4 mg/dL — ABNORMAL HIGH (ref 0.0–0.2)
Globulin: 2 g/dL (calc) (ref 1.9–3.7)
Indirect Bilirubin: 1.1 mg/dL (calc) (ref 0.2–1.2)
Total Bilirubin: 1.5 mg/dL — ABNORMAL HIGH (ref 0.2–1.2)
Total Protein: 6.3 g/dL (ref 6.1–8.1)

## 2020-03-14 LAB — PROTIME-INR
INR: 1.1
Prothrombin Time: 11.5 s (ref 9.0–11.5)

## 2020-03-20 ENCOUNTER — Ambulatory Visit (INDEPENDENT_AMBULATORY_CARE_PROVIDER_SITE_OTHER): Payer: Medicare PPO | Admitting: Family Medicine

## 2020-03-20 ENCOUNTER — Other Ambulatory Visit: Payer: Self-pay

## 2020-03-20 ENCOUNTER — Encounter: Payer: Self-pay | Admitting: Family Medicine

## 2020-03-20 DIAGNOSIS — R17 Unspecified jaundice: Secondary | ICD-10-CM

## 2020-03-20 DIAGNOSIS — R7989 Other specified abnormal findings of blood chemistry: Secondary | ICD-10-CM

## 2020-03-20 NOTE — Patient Instructions (Addendum)
Referral to GI  Please schedule a Follow-up Appointment to: Return if symptoms worsen or fail to improve.  If you have any other questions or concerns, please feel free to call the office or send a message through [facility]. You may also schedule an earlier appointment if necessary.  Additionally, you may be receiving a survey about your experience at our office within a few days to 1 week by e-mail or mail. We value your feedback.  Nobie Putnam, DO [facility]

## 2020-03-20 NOTE — Progress Notes (Signed)
Virtual Visit via Telephone The purpose of this virtual visit is to provide medical care while limiting exposure to the novel coronavirus (COVID19) for both patient and office staff.  Consent was obtained for phone visit:  Yes.   Answered questions that patient had about telehealth interaction:  Yes.   I discussed the limitations, risks, security and privacy concerns of performing an evaluation and management service by telephone. I also discussed with the patient that there may be a patient responsible charge related to this service. The patient expressed understanding and agreed to proceed.  Patient Location: Home Provider Location: [facility] (Office)  Participants in virtual visit: - Patient: Todd Frank - CMA: Frederich Cha, [place] - Provider: Dr Parks Ranger  ---------------------------------------------------------------------- Chief Complaint  Patient presents with  . Results    Blood work and ultrasound result     S: Reviewed CMA documentation. I have called patient and gathered additional HPI as follows:  Elevated Total Bilirubin, / Direct Hyperbilirubinemia Reports that this problem started 09/2018 with elevated Bilirubin testing. Repeat testing has remained fairly consistently elevated. Recent testing showed Abdominal Complete Ultrasound normal except cholelithiasis, see results below. He has no significant symptom from this Hyperbilirubinemia, no jaundice or other concerns.  Denies any fevers, chills, sweats, body ache, cough, shortness of breath, sinus pain or pressure, headache, abdominal pain, diarrhea  Past Medical History:  Diagnosis Date  . Aortic stenosis    a. syncope/severe AS s/p bioprosthetic AVR 06/2017.  . Arthritis   . Ascending aorta dilatation (HCC)    36mm by echo 11/2019  . Bicuspid aortic valve   . Bradycardia    secondary to BB  . Broken clavicle    as child, can still dislocate at times  . CAD (coronary artery disease),  native coronary artery    a. s/p 2V CABG at time of AVR 06/2017.  . Carotid bruit    carotid dopplers negative.  Bruit due to heart murmur from AS  . Congenital nevus   . Diastolic dysfunction   . GERD (gastroesophageal reflux disease)   . Headache    optical migraine  . Hx of dysplastic nevus 2010   multiple sites   . Hx of melanoma in situ 2011   Left superior thigh   . Hyperlipidemia    LDL goal < 70  . LVH (left ventricular hypertrophy)   . Melanoma of skin ([place])   . Obesity   . PFO (patent foramen ovale)    small by echo 2014, but not demonstrated during intra-op note from CABG/AVR 06/2017.   Social History   Tobacco Use  . Smoking status: Former Smoker    Packs/day: 1.00    Quit date: 04/07/1976    Years since quitting: 43.9  . Smokeless tobacco: Former Systems developer  . Tobacco comment: quit in 78, teenager  Vaping Use  . Vaping Use: Never used  Substance Use Topics  . Alcohol use: Yes    Alcohol/week: 1.0 standard drink    Types: 1 Cans of beer per week    Comment: occasional  . Drug use: No    Comment: quit marijuana in 1990    Current Outpatient Medications:  .  aspirin EC 81 MG tablet, Take 81 mg by mouth daily., Disp: , Rfl:  .  atorvastatin (LIPITOR) 80 MG tablet, TAKE 1 TABLET BY MOUTH DAILY AT 06:00PM, Disp: 90 tablet, Rfl: 2 .  azelastine (ASTELIN) 0.1 % nasal spray, Place 1 spray into both nostrils 2 (two) times  daily. Use in each nostril as directed, Disp: 30 mL, Rfl: 2 .  cholecalciferol (VITAMIN D) 1000 units tablet, Take 1,000 Units by mouth 2 (two) times daily., Disp: , Rfl:  .  Coenzyme Q10 (COQ-10) 200 MG CAPS, Take 200 mg by mouth daily., Disp: , Rfl:  .  fluorouracil (EFUDEX) 5 % cream, Apply topically 2 (two) times daily., Disp: 40 g, Rfl: 1 .  fluticasone (FLONASE) 50 MCG/ACT nasal spray, Place 2 sprays into both nostrils daily as needed (for allergies.)., Disp: 48 g, Rfl: 3 .  NEOMYCIN-POLYMYXIN-HYDROCORTISONE (CORTISPORIN) 1 % SOLN OTIC solution, Apply  1-2 drops to toe BID after soaking, Disp: 10 mL, Rfl: 1  Depression screen [facility] 2/9 03/20/2020 02/17/2020 02/15/2019  Decreased Interest 0 0 0  Down, Depressed, Hopeless 0 0 0  PHQ - 2 Score 0 0 0  Altered sleeping - - 0  Tired, decreased energy - - 0  Change in appetite - - 0  Feeling bad or failure about yourself  - - 0  Trouble concentrating - - 0  Moving slowly or fidgety/restless - - 0  Suicidal thoughts - - 0  PHQ-9 Score - - 0  Difficult doing work/chores - - -    GAD 7 : Generalized Anxiety Score 02/15/2019  Nervous, Anxious, on Edge 0  Control/stop worrying 0  Worry too much - different things 0  Trouble relaxing 0  Restless 0  Easily annoyed or irritable 0  Afraid - awful might happen 0  Total GAD 7 Score 0    -------------------------------------------------------------------------- O: No physical exam performed due to remote telephone encounter.  Lab results reviewed.    US Abdomen Complete [[id number]] Resulted: 03/06/20 1449  Order Status: Completed Updated: 03/06/20 1451  Narrative:   CLINICAL DATA: Elevated bilirubin   EXAM:  ABDOMEN ULTRASOUND COMPLETE   COMPARISON: None.   FINDINGS:  Gallbladder: 6 mm gallstone. No wall thickening visualized. No  sonographic Murphy sign noted by sonographer.   Common bile duct: Diameter: 3 mm, normal   Liver: No focal lesion identified. Within normal limits in  parenchymal echogenicity. Portal vein is patent on color Doppler  imaging with normal direction of blood flow towards the liver.   IVC: No abnormality visualized.   Pancreas: Not visualized due to overlying bowel gas.   Spleen: Size and appearance within normal limits.   Right Kidney: Length: 11 cm. Echogenicity within normal limits. No  mass or hydronephrosis visualized.   Left Kidney: Length: 11.8 cm. Echogenicity within normal limits. No  mass or hydronephrosis visualized.   Abdominal aorta: No aneurysm visualized.   Other findings:  None.   IMPRESSION:  Cholelithiasis. No biliary dilatation.   Otherwise unremarkable examination.    Electronically Signed  By: Macy Mis M.D.  On: 03/06/2020 14:49       Recent Results (from the past 2160 hour(s))  [facility] - VITAMIN D 25 Hydroxy (Vit-D Deficiency, Fractures)     Status: Abnormal   Collection Time: 02/13/20  8:13 AM  Result Value Ref Range   Vit D, 25-Hydroxy 29 (L) 30 - 100 ng/mL    Comment: Vitamin D Status         25-OH Vitamin D: . Deficiency:                    <20 ng/mL Insufficiency:             20 - 29 ng/mL Optimal:                 >  or = 30 ng/mL . For 25-OH Vitamin D testing on patients on  D2-supplementation and patients for whom quantitation  of D2 and D3 fractions is required, the QuestAssureD(TM) 25-OH VIT D, (D2,D3), LC/MS/MS is recommended: order  code [phone] (patients >33yrs). See Note 1 . Note 1 . For additional information, please refer to  http://education.Quest[facility].com/faq/FAQ199  (This link is being provided for informational/ educational purposes only.)   [facility] - PSA physical     Status: None   Collection Time: 02/13/20  8:13 AM  Result Value Ref Range   PSA 0.86 < OR = 4.0 ng/mL    Comment: The total PSA value from this assay system is  standardized against the WHO standard. The test  result will be approximately 20% lower when compared  to the equimolar-standardized total PSA (Beckman  Coulter). Comparison of serial PSA results should be  interpreted with this fact in mind. . This test was performed using the Siemens  chemiluminescent method. Values obtained from  different assay methods cannot be used interchangeably. PSA levels, regardless of value, should not be interpreted as absolute evidence of the presence or absence of disease.   [facility] - Lipid panel physical     Status: None   Collection Time: 02/13/20  8:13 AM  Result Value Ref Range   Cholesterol 110 <200 mg/dL   HDL 55 > OR = 40 mg/dL    Triglycerides 82 <150 mg/dL   LDL Cholesterol (Calc) 39 mg/dL (calc)    Comment: Reference range: <100 . Desirable range <100 mg/dL for primary prevention;   <70 mg/dL for patients with CHD or diabetic patients  with > or = 2 CHD risk factors. Marland Kitchen LDL-C is now calculated using the Martin-Hopkins  calculation, which is a validated novel method providing  better accuracy than the Friedewald equation in the  estimation of LDL-C.  Cresenciano Genre et al. Annamaria Helling. 5364;680(32): 2061-2068  (http://education.[facility].com/faq/FAQ164)    Total CHOL/HDL Ratio 2.0 <5.0 (calc)   Non-HDL Cholesterol (Calc) 55 <130 mg/dL (calc)    Comment: For patients with diabetes plus 1 major ASCVD risk  factor, treating to a non-HDL-C goal of <100 mg/dL  (LDL-C of <70 mg/dL) is considered a therapeutic  option.   [facility] - CMET w/ GFR CMP Complete Metabolic Panel physical     Status: Abnormal   Collection Time: 02/13/20  8:13 AM  Result Value Ref Range   Glucose, Bld 88 65 - 99 mg/dL    Comment: .            Fasting reference interval .    BUN 12 7 - 25 mg/dL   Creat 0.81 0.70 - 1.25 mg/dL    Comment: For patients >31 years of age, the reference limit for Creatinine is approximately 13% higher for people identified as African-American. .    GFR, Est Non African American 93 > OR = 60 mL/min/1.68m2   GFR, Est African American 108 > OR = 60 mL/min/1.58m2   BUN/Creatinine Ratio NOT APPLICABLE 6 - 22 (calc)   Sodium 140 135 - 146 mmol/L   Potassium 4.3 3.5 - 5.3 mmol/L   Chloride 105 98 - 110 mmol/L   CO2 26 20 - 32 mmol/L   Calcium 9.6 8.6 - 10.3 mg/dL   Total Protein 6.5 6.1 - 8.1 g/dL   Albumin 4.5 3.6 - 5.1 g/dL   Globulin 2.0 1.9 - 3.7 g/dL (calc)   AG Ratio 2.3 1.0 - 2.5 (calc)   Total Bilirubin 1.8 (H) 0.2 -  1.2 mg/dL   Alkaline phosphatase (APISO) 90 35 - 144 U/L   AST 34 10 - 35 U/L   ALT 25 9 - 46 U/L  [facility] - CBC with Differential/Platelet physical     Status: Abnormal   Collection Time:  02/13/20  8:13 AM  Result Value Ref Range   WBC 3.7 (L) 3.8 - 10.8 Thousand/uL   RBC 5.08 4.20 - 5.80 Million/uL   Hemoglobin 16.0 13.2 - 17.1 g/dL   HCT 47.6 38.5 - 50.0 %   MCV 93.7 80.0 - 100.0 fL   MCH 31.5 27.0 - 33.0 pg   MCHC 33.6 32.0 - 36.0 g/dL   RDW 12.2 11.0 - 15.0 %   Platelets 113 (L) 140 - 400 Thousand/uL   MPV 12.9 (H) 7.5 - 12.5 fL   Neutro Abs 2,257 1,500 - 7,800 cells/uL   Lymphs Abs 777 (L) 850 - 3,900 cells/uL   Absolute Monocytes 448 200 - 950 cells/uL   Eosinophils Absolute 159 15 - 500 cells/uL   Basophils Absolute 59 0 - 200 cells/uL   Neutrophils Relative % 61 %   Total Lymphocyte 21.0 %   Monocytes Relative 12.1 %   Eosinophils Relative 4.3 %   Basophils Relative 1.6 %  [facility] - A1c LAB Hemoglobin A1C physical     Status: None   Collection Time: 02/13/20  8:13 AM  Result Value Ref Range   Hgb A1c MFr Bld 5.1 <5.7 % of total Hgb    Comment: For the purpose of screening for the presence of diabetes: . <5.7%       Consistent with the absence of diabetes 5.7-6.4%    Consistent with increased risk for diabetes             (prediabetes) > or =6.5%  Consistent with diabetes . This assay result is consistent with a decreased risk of diabetes. . Currently, no consensus exists regarding use of hemoglobin A1c for diagnosis of diabetes in children. . According to American Diabetes Association (ADA) guidelines, hemoglobin A1c <7.0% represents optimal control in non-pregnant diabetic patients. Different metrics may apply to specific patient populations.  Standards of Medical Care in Diabetes(ADA). .    Mean Plasma Glucose 100 (calc)   eAG (mmol/L) 5.5 (calc)  Protime-INR     Status: None   Collection Time: 03/13/20  9:43 AM  Result Value Ref Range   INR 1.1     Comment: Reference Range                     0.9-1.1 Moderate-intensity Warfarin Therapy 2.0-3.0 Higher-intensity Warfarin Therapy   3.0-4.0  .    Prothrombin Time 11.5 9.0 - 11.5 sec     Comment: For additional information, please refer to http://education.questdiagnostics.com/faq/FAQ104 (This link is being provided for informational/ educational purposes only.)   Hepatic function panel     Status: Abnormal   Collection Time: 03/13/20  9:43 AM  Result Value Ref Range   Total Protein 6.3 6.1 - 8.1 g/dL   Albumin 4.3 3.6 - 5.1 g/dL   Globulin 2.0 1.9 - 3.7 g/dL (calc)   AG Ratio 2.2 1.0 - 2.5 (calc)   Total Bilirubin 1.5 (H) 0.2 - 1.2 mg/dL   Bilirubin, Direct 0.4 (H) 0.0 - 0.2 mg/dL   Indirect Bilirubin 1.1 0.2 - 1.2 mg/dL (calc)   Alkaline phosphatase (APISO) 86 35 - 144 U/L   AST 27 10 - 35 U/L   ALT 20 9 - 46 U/L    --------------------------------------------------------------------------  A&P:  Problem List Items Addressed This Visit   None   Visit Diagnoses    Total bilirubin, elevated    -  Primary   Relevant Orders   Ambulatory referral to Gastroenterology   High direct bilirubin       Relevant Orders   Ambulatory referral to Gastroenterology      Reason for Referral: referral to GI for consultation of lab abnormality with isolated T Bilirubin elevated and then elevated Direct bilirubin 0.4 on fract bili test, with normal Indirect. Initial elevated Bilirubin was 09/2018 has since fluctuated but remained elevated over past 1.5 years. Abdominal US complete was unremarkable, some cholelithiasis, no liver abnormality. Requesting further evaluation and diagnostic work up / treatment if indicated.   Has the referral been discussed with the patient?: Yes   Designated contact for the referral if not the patient (name/phone number): patient on chart   Has the patient seen a specialist for this issue before?: No  If so, who (practice/provider)?   Does the patient have a provider or location preference for the referral?: Yes - [place]  Would the patient like to see previous specialist if applicable?  Orders Placed This Encounter  Procedures   . Ambulatory referral to Gastroenterology    Referral Priority:   Routine    Referral Type:   Consultation    Referral Reason:   Specialty Services Required    Number of Visits Requested:   1    No orders of the defined types were placed in this encounter.   Follow-up: - Return as needed  Patient verbalizes understanding with the above medical recommendations including the limitation of remote medical advice.  Specific follow-up and call-back criteria were given for patient to follow-up or seek medical care more urgently if needed.   - Time spent in direct consultation with patient on phone: 7 minutes   Nobie Putnam, [facility] 03/20/2020, 10:06 AM

## 2020-03-23 ENCOUNTER — Encounter: Payer: Self-pay | Admitting: *Deleted

## 2020-03-26 ENCOUNTER — Ambulatory Visit: Payer: Medicare PPO | Admitting: Podiatry

## 2020-03-26 ENCOUNTER — Other Ambulatory Visit: Payer: Self-pay

## 2020-03-26 ENCOUNTER — Encounter: Payer: Self-pay | Admitting: Podiatry

## 2020-03-26 DIAGNOSIS — L603 Nail dystrophy: Secondary | ICD-10-CM | POA: Diagnosis not present

## 2020-03-26 DIAGNOSIS — Z9889 Other specified postprocedural states: Secondary | ICD-10-CM

## 2020-03-26 DIAGNOSIS — L600 Ingrowing nail: Secondary | ICD-10-CM

## 2020-03-26 DIAGNOSIS — B070 Plantar wart: Secondary | ICD-10-CM

## 2020-03-26 NOTE — Progress Notes (Signed)
He presents today for follow-up of his matrixectomy hallux right and 2 small warts to the plantar aspect of the left foot.  States that the Efudex cream seems to be getting rid of those warts.  States that his matrixectomy is doing well.  Continues to soak every other day at least he says.  Objective: Vital signs are stable alert oriented x3 granulation Tippets tissue and epithelialization is occurring along the tibial border of the hallux right.  Pathology results do demonstrate a yeast and saprophytic fungus.  But this was contained in the piece that we removed and it appears that we removed at all.  The warts on the plantar aspect of the left foot are resolving nicely.  Assessment: Well-healing surgical toe hallux right foot healing verruca plantaris left onychomycosis right foot.  Plan: At this point were going to wait to see if the onychomycosis has spread or if we even need to use any oral medication at all.  I do feel that this should go on to heal uneventfully as well as the warts with utilization of the Efudex cream.  I will follow-up with him as needed.

## 2020-03-27 ENCOUNTER — Ambulatory Visit: Payer: Medicare PPO | Admitting: Dermatology

## 2020-03-27 ENCOUNTER — Encounter: Payer: Self-pay | Admitting: Dermatology

## 2020-03-27 DIAGNOSIS — D1800 Hemangioma unspecified site: Secondary | ICD-10-CM

## 2020-03-27 DIAGNOSIS — Z86018 Personal history of other benign neoplasm: Secondary | ICD-10-CM

## 2020-03-27 DIAGNOSIS — Z1283 Encounter for screening for malignant neoplasm of skin: Secondary | ICD-10-CM

## 2020-03-27 DIAGNOSIS — L738 Other specified follicular disorders: Secondary | ICD-10-CM

## 2020-03-27 DIAGNOSIS — Z85820 Personal history of malignant melanoma of skin: Secondary | ICD-10-CM | POA: Diagnosis not present

## 2020-03-27 DIAGNOSIS — L821 Other seborrheic keratosis: Secondary | ICD-10-CM

## 2020-03-27 DIAGNOSIS — L578 Other skin changes due to chronic exposure to nonionizing radiation: Secondary | ICD-10-CM

## 2020-03-27 DIAGNOSIS — D229 Melanocytic nevi, unspecified: Secondary | ICD-10-CM

## 2020-03-27 DIAGNOSIS — L814 Other melanin hyperpigmentation: Secondary | ICD-10-CM

## 2020-03-27 NOTE — Progress Notes (Signed)
   Follow-Up Visit   Subjective  Todd Frank is a 64 y.o. male who presents for the following: Annual Exam (Hx MM, dysplastic nevi ). The patient presents for Total-Body Skin Exam (TBSE) for skin cancer screening and mole check.  The following portions of the chart were reviewed this encounter and updated as appropriate:   Tobacco  Allergies  Meds  Problems  Med Hx  Surg Hx  Fam Hx     Review of Systems:  No other skin or systemic complaints except as noted in HPI or Assessment and Plan.  Objective  Well appearing patient in no apparent distress; mood and affect are within normal limits.  A full examination was performed including scalp, head, eyes, ears, nose, lips, neck, chest, axillae, abdomen, back, buttocks, bilateral upper extremities, bilateral lower extremities, hands, feet, fingers, toes, fingernails, and toenails. All findings within normal limits unless otherwise noted below.  Objective  Face: Small yellow papules with a central dell.   Assessment & Plan  Sebaceous hyperplasia of face Face  Benign appearing, observe.  Skin cancer screening   Lentigines - Scattered tan macules - Discussed due to sun exposure - Benign, observe - Call for any changes  Seborrheic Keratoses - Stuck-on, waxy, tan-brown papules and plaques  - Discussed benign etiology and prognosis. - Observe - Call for any changes  Melanocytic Nevi - Tan-brown and/or pink-flesh-colored symmetric macules and papules - Benign appearing on exam today - Observation - Call clinic for new or changing moles - Recommend daily use of broad spectrum spf 30+ sunscreen to sun-exposed areas.   Hemangiomas - Red papules - Discussed benign nature - Observe - Call for any changes  Actinic Damage - Chronic, secondary to cumulative UV/sun exposure - diffuse scaly erythematous macules with underlying dyspigmentation - Recommend daily broad spectrum sunscreen SPF 30+ to sun-exposed areas, reapply  every 2 hours as needed.  - Call for new or changing lesions.  History of Melanoma - L sup thigh  - No evidence of recurrence today - No lymphadenopathy - Recommend regular full body skin exams - Recommend daily broad spectrum sunscreen SPF 30+ to sun-exposed areas, reapply every 2 hours as needed.  - Call if any new or changing lesions are noted between office visits  History of Dysplastic Nevi - No evidence of recurrence today - Recommend regular full body skin exams - Recommend daily broad spectrum sunscreen SPF 30+ to sun-exposed areas, reapply every 2 hours as needed.  - Call if any new or changing lesions are noted between office visits  Skin cancer screening performed today.  Return in about 1 year (around 03/27/2021) for TBSE - hx dysplastic nevi, MM.  I, Rudell Cobb, CMA, am acting as scribe for Sarina Ser, MD .  Documentation: I have reviewed the above documentation for accuracy and completeness, and I agree with the above.  Sarina Ser, MD

## 2020-04-02 ENCOUNTER — Telehealth: Payer: Self-pay | Admitting: Family Medicine

## 2020-04-02 DIAGNOSIS — Z1211 Encounter for screening for malignant neoplasm of colon: Secondary | ICD-10-CM

## 2020-04-02 NOTE — Telephone Encounter (Signed)
Ordered Cologuard  Saralyn Pilar, DO [facility] 04/02/2020, 12:29 PM

## 2020-04-05 ENCOUNTER — Encounter: Payer: Self-pay | Admitting: Dermatology

## 2020-04-10 DIAGNOSIS — Z1211 Encounter for screening for malignant neoplasm of colon: Secondary | ICD-10-CM | POA: Diagnosis not present

## 2020-04-10 DIAGNOSIS — Z1212 Encounter for screening for malignant neoplasm of rectum: Secondary | ICD-10-CM | POA: Diagnosis not present

## 2020-04-11 DIAGNOSIS — F411 Generalized anxiety disorder: Secondary | ICD-10-CM | POA: Diagnosis not present

## 2020-04-12 LAB — COLOGUARD: Cologuard: NEGATIVE

## 2020-04-18 DIAGNOSIS — G4733 Obstructive sleep apnea (adult) (pediatric): Secondary | ICD-10-CM | POA: Diagnosis not present

## 2020-04-21 LAB — COLOGUARD: COLOGUARD: NEGATIVE

## 2020-04-25 DIAGNOSIS — F411 Generalized anxiety disorder: Secondary | ICD-10-CM | POA: Diagnosis not present

## 2020-05-09 DIAGNOSIS — F411 Generalized anxiety disorder: Secondary | ICD-10-CM | POA: Diagnosis not present

## 2020-05-22 ENCOUNTER — Other Ambulatory Visit: Payer: Self-pay

## 2020-05-22 ENCOUNTER — Ambulatory Visit: Payer: Medicare PPO | Admitting: Gastroenterology

## 2020-05-22 ENCOUNTER — Encounter: Payer: Self-pay | Admitting: Gastroenterology

## 2020-05-22 VITALS — BP 134/81 | HR 75 | Ht 70.0 in | Wt 192.8 lb

## 2020-05-22 DIAGNOSIS — R7989 Other specified abnormal findings of blood chemistry: Secondary | ICD-10-CM

## 2020-05-22 NOTE — Progress Notes (Signed)
Gastroenterology Consultation  Referring Provider:     Nobie Putnam * Primary Care Physician:  Olin Hauser, DO Primary Gastroenterologist:  Dr. Allen Norris     Reason for Consultation:     Elevated bilirubin        HPI:   Todd Frank is a 65 y.o. y/o male referred for consultation & management of Elevated bilirubin by Dr. Parks Ranger, Devonne Doughty, DO.  This patient comes in today after eating found to have a chronically elevated bilirubin.  The patient's bilirubin was predominantly indirect.  The patient's liver enzymes have shown:  Component     Latest Ref Rng & Units 09/20/2018 02/22/2019 02/13/2020 03/13/2020  Total Bilirubin     0.2 - 1.2 mg/dL 1.4 (H) 1.3 (H) 1.8 (H) 1.5 (H)  Bilirubin, Direct     0.0 - 0.2 mg/dL    0.4 (H)  Indirect Bilirubin     0.2 - 1.2 mg/dL (calc)    1.1  Alkaline phosphatase (APISO)     35 - 144 U/L   90 86  AST     10 - 35 U/L 36 31 34 27  ALT     9 - 46 U/L 26 23 25 20    The patient also has chronic thrombocytopenia. He had an ultrasound of the abdomen that showed:  IMPRESSION: Cholelithiasis.  No biliary dilatation. Otherwise unremarkable examination.  In addition there was no report of any splenomegaly.  He states that his platelets have been low because he was told that the blood was clumped when it got to the lab.  The patient denies ever being told that he had other abnormal liver enzymes.  Past Medical History:  Diagnosis Date  . Aortic stenosis    a. syncope/severe AS s/p bioprosthetic AVR 06/2017.  . Arthritis   . Ascending aorta dilatation (HCC)    21mm by echo 11/2019  . Bicuspid aortic valve   . Bradycardia    secondary to BB  . Broken clavicle    as child, can still dislocate at times  . CAD (coronary artery disease), native coronary artery    a. s/p 2V CABG at time of AVR 06/2017.  . Carotid bruit    carotid dopplers negative.  Bruit due to heart murmur from AS  . Congenital nevus   . Diastolic  dysfunction   . Dysplastic nevus 12/12/2008   Left mid back, 9cm lat. to spine. Moderate atypia, extends to one edge.   Marland Kitchen Dysplastic nevus 12/12/2008   Left mid to low back, 7cm lat. to spine. Moderate atypia, close to margin.   Marland Kitchen Dysplastic nevus 09/24/2012   Right mid lateral buttocks. Moderate atypia, close to edge.   Marland Kitchen GERD (gastroesophageal reflux disease)   . Headache    optical migraine  . Hx of melanoma in situ 06/26/2009   Left superior thigh. Arising in dysplastic nevus, close to margin. Excised 07/11/2009, margins free.  Marland Kitchen Hyperlipidemia    LDL goal < 70  . LVH (left ventricular hypertrophy)   . Melanoma of skin ([place])   . Obesity   . PFO (patent foramen ovale)    small by echo 2014, but not demonstrated during intra-op note from CABG/AVR 06/2017.    Past Surgical History:  Procedure Laterality Date  . AORTIC VALVE REPLACEMENT N/A 06/25/2017   Procedure: AORTIC VALVE REPLACEMENT (AVR) using Magna Ease Aortic Valve size 49mm;  Surgeon: Ivin Poot, MD;  Location: [place];  Service: Open Heart Surgery;  Laterality: N/A;  . arthritic cyst removal  2017   from Lumbar 4-5, dr pool  . CARDIAC CATHETERIZATION N/A 02/20/2016   Procedure: Right/Left Heart Cath and Coronary Angiography;  Surgeon: Belva Crome, MD;  Location: [place] [facility];  Service: Cardiovascular;  Laterality: N/A;  . CORONARY ARTERY BYPASS GRAFT N/A 06/25/2017   Procedure: CORONARY ARTERY BYPASS GRAFTING (CABG) x Two , using left internal mammary artery and right leg greater saphenous vein harvested endoscopically;  Surgeon: Ivin Poot, MD;  Location: [place];  Service: Open Heart Surgery;  Laterality: N/A;  . CORONARY/GRAFT ANGIOGRAPHY N/A 06/02/2017   Procedure: CORONARY/GRAFT ANGIOGRAPHY;  Surgeon: Nelva Bush, MD;  Location: [place] [facility];  Service: Cardiovascular;  Laterality: N/A;  . EYE SURGERY    . MELANOMA EXCISION  2011   left thigh  . RIGHT HEART CATH N/A 06/02/2017   Procedure:  RIGHT HEART CATH;  Surgeon: Nelva Bush, MD;  Location: [place] [facility];  Service: Cardiovascular;  Laterality: N/A;  . TEE WITHOUT CARDIOVERSION N/A 06/25/2017   Procedure: TRANSESOPHAGEAL ECHOCARDIOGRAM (TEE);  Surgeon: Prescott Gum, Collier Salina, MD;  Location: [place];  Service: Open Heart Surgery;  Laterality: N/A;  . TONSILLECTOMY      Prior to Admission medications   Medication Sig Start Date End Date Taking? Authorizing Provider  aspirin EC 81 MG tablet Take 81 mg by mouth daily. Patient not taking: Reported on 03/27/2020    [provider]  atorvastatin (LIPITOR) 80 MG tablet TAKE 1 TABLET BY MOUTH DAILY AT 06:00PM 11/07/19   [name], [name], MD  azelastine (ASTELIN) 0.1 % nasal spray Place 1 spray into both nostrils 2 (two) times daily. Use in each nostril as directed Patient not taking: Reported on 03/27/2020 02/15/19   Olin Hauser, DO  cholecalciferol (VITAMIN D) 1000 units tablet Take 1,000 Units by mouth 2 (two) times daily. Patient not taking: Reported on 03/27/2020    [provider]  Coenzyme Q10 (COQ-10) 200 MG CAPS Take 200 mg by mouth daily. Patient not taking: Reported on 03/27/2020    [provider]  fluorouracil (EFUDEX) 5 % cream Apply topically 2 (two) times daily. Patient not taking: Reported on 03/27/2020 03/12/20   Hyatt, Max T, DPM  fluticasone (FLONASE) 50 MCG/ACT nasal spray Place 2 sprays into both nostrils daily as needed (for allergies.). Patient not taking: Reported on 03/27/2020 08/04/19   Olin Hauser, DO  NEOMYCIN-POLYMYXIN-HYDROCORTISONE (CORTISPORIN) 1 % SOLN OTIC solution Apply 1-2 drops to toe BID after soaking Patient not taking: Reported on 03/27/2020 03/12/20   Garrel Ridgel, DPM    Family History  Problem Relation Age of Onset  . Diabetes Mother   . Heart disease Mother   . Arthritis Father   . Arthritis Sister   . Heart disease Paternal Grandmother        MI  . Heart attack Other        family  history  . Pancreatic cancer Maternal Aunt      Social History   Tobacco Use  . Smoking status: Former Smoker    Packs/day: 1.00    Quit date: 04/07/1976    Years since quitting: 44.1  . Smokeless tobacco: Former Systems developer  . Tobacco comment: quit in 78, teenager  Vaping Use  . Vaping Use: Never used  Substance Use Topics  . Alcohol use: Yes    Alcohol/week: 1.0 standard drink    Types: 1 Cans of beer per week  Comment: occasional  . Drug use: No    Comment: quit marijuana in 1990    Allergies as of 05/22/2020  . (No Known Allergies)    Review of Systems:    All systems reviewed and negative except where noted in HPI.   Physical Exam:  There were no vitals taken for this visit. No LMP for male patient. General:   Alert,  Well-developed, well-nourished, pleasant and cooperative in NAD Head:  Normocephalic and atraumatic. Eyes:  Sclera clear, no icterus.   Conjunctiva pink. Ears:  Normal auditory acuity. Neck:  Supple; no masses or thyromegaly. Lungs:  Respirations even and unlabored.  Clear throughout to auscultation.   No wheezes, crackles, or rhonchi. No acute distress. Heart:  Regular rate and rhythm; no murmurs, clicks, rubs, or gallops. Abdomen:  Normal bowel sounds.  No bruits.  Soft, non-tender and non-distended without masses, hepatosplenomegaly or hernias noted.  No guarding or rebound tenderness.  Negative Carnett sign.   Rectal:  Deferred.  Pulses:  Normal pulses noted. Extremities:  No clubbing or edema.  No cyanosis. Neurologic:  Alert and oriented x3;  grossly normal neurologically. Skin:  Intact without significant lesions or rashes.  No jaundice. Lymph Nodes:  No significant cervical adenopathy. Psych:  Alert and cooperative. Normal mood and affect.  Imaging Studies: No results found.  Assessment and Plan:   Todd Frank is a 65 y.o. y/o male comes in today with thrombocytopenia and an ultrasound showing no sign of portal hypertension such as varices  ascites or splenomegaly.  The patient also was found to have a isolated increased bilirubin with the majority of it being indirect at 1.1 with the minority being direct at 0.4.  The patient's elevation is likely due to Gilbert's but his GGT will be checked to make sure there is no liver process going on.  The patient will also be sent to hematology to review why his platelets have been chronically low.  If his GGT comes back elevated then I will consider doing a FibroScan or FibroSure to rule out liver fibrosis.  The patient has been explained the plan agrees with it    Lucilla Lame, MD. Marval Regal    Note: This dictation was prepared with Dragon dictation along with smaller phrase technology. Any transcriptional errors that result from this process are unintentional.

## 2020-05-23 LAB — GAMMA GT: GGT: 30 IU/L (ref 0–65)

## 2020-05-24 ENCOUNTER — Other Ambulatory Visit: Payer: Self-pay

## 2020-05-24 ENCOUNTER — Telehealth: Payer: Self-pay

## 2020-05-24 DIAGNOSIS — D691 Qualitative platelet defects: Secondary | ICD-10-CM

## 2020-05-24 NOTE — Telephone Encounter (Signed)
Pt notified of lab results through mychart.  

## 2020-05-24 NOTE — Telephone Encounter (Signed)
-----   Message from Lucilla Lame, MD sent at 05/24/2020  1:00 PM EST ----- Let the patient know that his GGT was normal indicating that there is no active liver process going on at this time and with his bilirubin being predominantly unconjugated and his alkaline phosphatase being normal he does not need any further work-up.

## 2020-06-04 ENCOUNTER — Inpatient Hospital Stay: Payer: Medicare PPO | Attending: Oncology | Admitting: Oncology

## 2020-06-04 ENCOUNTER — Encounter: Payer: Self-pay | Admitting: Oncology

## 2020-06-04 ENCOUNTER — Inpatient Hospital Stay: Payer: Medicare PPO

## 2020-06-04 VITALS — BP 136/94 | HR 66 | Temp 96.6°F | Resp 18 | Wt 193.7 lb

## 2020-06-04 DIAGNOSIS — Z87891 Personal history of nicotine dependence: Secondary | ICD-10-CM

## 2020-06-04 DIAGNOSIS — D696 Thrombocytopenia, unspecified: Secondary | ICD-10-CM | POA: Insufficient documentation

## 2020-06-04 LAB — CBC WITH DIFFERENTIAL/PLATELET
Abs Immature Granulocytes: 0.01 10*3/uL (ref 0.00–0.07)
Basophils Absolute: 0.1 10*3/uL (ref 0.0–0.1)
Basophils Relative: 2 %
Eosinophils Absolute: 0.3 10*3/uL (ref 0.0–0.5)
Eosinophils Relative: 6 %
HCT: 46 % (ref 39.0–52.0)
Hemoglobin: 15.8 g/dL (ref 13.0–17.0)
Immature Granulocytes: 0 %
Lymphocytes Relative: 21 %
Lymphs Abs: 1 10*3/uL (ref 0.7–4.0)
MCH: 30.8 pg (ref 26.0–34.0)
MCHC: 34.3 g/dL (ref 30.0–36.0)
MCV: 89.7 fL (ref 80.0–100.0)
Monocytes Absolute: 0.5 10*3/uL (ref 0.1–1.0)
Monocytes Relative: 10 %
Neutro Abs: 3 10*3/uL (ref 1.7–7.7)
Neutrophils Relative %: 61 %
Platelets: 110 10*3/uL — ABNORMAL LOW (ref 150–400)
RBC: 5.13 MIL/uL (ref 4.22–5.81)
RDW: 12.7 % (ref 11.5–15.5)
WBC: 4.8 10*3/uL (ref 4.0–10.5)
nRBC: 0 % (ref 0.0–0.2)

## 2020-06-04 LAB — TECHNOLOGIST SMEAR REVIEW
Plt Morphology: DECREASED
RBC Morphology: NORMAL
WBC Morphology: NORMAL

## 2020-06-04 LAB — VITAMIN B12: Vitamin B-12: 186 pg/mL (ref 180–914)

## 2020-06-04 LAB — FOLATE: Folate: 12 ng/mL (ref >5.9)

## 2020-06-04 LAB — HIV ANTIBODY (ROUTINE TESTING W REFLEX): HIV Screen 4th Generation wRfx: NONREACTIVE

## 2020-06-04 LAB — HEPATITIS C ANTIBODY: HCV Ab: NONREACTIVE

## 2020-06-04 NOTE — Progress Notes (Addendum)
Hematology/Oncology Consult note [facility] [phone] [phone]  Patient Care Team: Olin Hauser, DO as PCP - General (Family Medicine) [name], MD as PCP - Cardiology (Cardiology) [name], MD as Consulting Physician (Cardiology) Sindy Guadeloupe, MD as Consulting Physician (Hematology and Oncology)   Name of the patient: Todd Frank  [id number]  [birth year]    Reason for referral-thrombocytopenia   Referring physician-Dr. Allen Norris  Date of visit: 06/04/20   History of presenting illness-patient is a 65 year old male with a past medical history significant for coronary artery disease, aortic stenosis, anxiety disorder, obstructive sleep apnea who has been referred to [place] for thrombocytopenia.Most recent CBC from 02/13/2020 showed white cell count of 3.7, H&H of 16/47.6 and a platelet count of 113.  Looking back at her prior CBCs his platelet count has fluctuated between 70s to 120s at least since 2017.  He was seen by Dr. Allen Norris for elevated bilirubin.  It is thought to be due to possible Gilbert's syndrome.  No known cirrhosis based on imaging.  Denies any easy bleeding or bruising. Notes that in one of his CBC's there was a mention of clumping of platelets  ECOG PS- 1  Pain scale- 0   Review of systems- Review of Systems  Constitutional: Negative for chills, fever and weight loss.  HENT: Negative for congestion, ear discharge and nosebleeds.   Eyes: Negative for blurred vision.  Respiratory: Negative for cough, hemoptysis, sputum production, shortness of breath and wheezing.   Cardiovascular: Negative for chest pain, palpitations, orthopnea and claudication.  Gastrointestinal: Negative for abdominal pain, blood in stool, constipation, diarrhea, heartburn, melena, nausea and vomiting.  Genitourinary: Negative for dysuria, flank pain, frequency, hematuria and urgency.  Musculoskeletal: Negative for back pain,  joint pain and myalgias.  Skin: Negative for rash.  Neurological: Negative for dizziness, tingling, focal weakness, seizures, weakness and headaches.  Endo/Heme/Allergies: Does not bruise/bleed easily.  Psychiatric/Behavioral: Negative for depression and suicidal ideas. The patient does not have insomnia.     No Known Allergies  Patient Active Problem List   Diagnosis Date Noted  . GAD (generalized anxiety disorder) 02/17/2020  . Bradycardia, drug induced 04/27/2018  . OSA on CPAP 02/12/2018  . Vitamin D deficiency 12/18/2017  . Thrombocytopenia ([place]) 11/13/2017  . Chronic frontal sinusitis 09/08/2017  . S/P aortic valve replacement with bioprosthetic valve 06/25/2017  . S/P CABG x 2 06/25/2017  . Syncope 05/31/2017  . CAD (coronary artery disease), native coronary artery   . Carotid bruit   . Abnormal nuclear stress test 02/20/2016  . Chronic low back pain with right-sided sciatica 02/04/2016  . Overweight (BMI 25.0-29.9) 02/04/2016  . Prostate cancer screening 02/04/2016  . Exposure to hepatitis C 02/04/2016  . History of melanoma excision 01/09/2016  . Colon cancer screening 01/09/2016  . Bradycardia 07/23/2015  . Allergic rhinitis 05/14/2015  . ED (erectile dysfunction) 02/02/2015  . Hamstring tightness 12/02/2013  . Fasciculations of muscle 12/02/2013  . Piriformis syndrome of right side 11/01/2013  . Iliotibial band syndrome 10/11/2013  . Severe aortic stenosis 01/26/2013  . Essential hypertension 01/26/2013  . Hypercholesteremia   . LVH (left ventricular hypertrophy)   . PFO (patent foramen ovale)   . Diastolic dysfunction   . GERD (gastroesophageal reflux disease)   . Dilated aortic root (HCC)      Past Medical History:  Diagnosis Date  . Aortic stenosis    a. syncope/severe AS s/p bioprosthetic AVR 06/2017.  . Arthritis   .  Ascending aorta dilatation (HCC)    81mm by echo 11/2019  . Bicuspid aortic valve   . Bradycardia    secondary to BB  . Broken  clavicle    as child, can still dislocate at times  . CAD (coronary artery disease), native coronary artery    a. s/p 2V CABG at time of AVR 06/2017.  . Carotid bruit    carotid dopplers negative.  Bruit due to heart murmur from AS  . Congenital nevus   . Diastolic dysfunction   . Dysplastic nevus 12/12/2008   Left mid back, 9cm lat. to spine. Moderate atypia, extends to one edge.   Marland Kitchen Dysplastic nevus 12/12/2008   Left mid to low back, 7cm lat. to spine. Moderate atypia, close to margin.   Marland Kitchen Dysplastic nevus 09/24/2012   Right mid lateral buttocks. Moderate atypia, close to edge.   Marland Kitchen GERD (gastroesophageal reflux disease)   . Headache    optical migraine  . Hx of melanoma in situ 06/26/2009   Left superior thigh. Arising in dysplastic nevus, close to margin. Excised 07/11/2009, margins free.  Marland Kitchen Hyperlipidemia    LDL goal < 70  . LVH (left ventricular hypertrophy)   . Melanoma of skin ([place])   . Obesity   . PFO (patent foramen ovale)    small by echo 2014, but not demonstrated during intra-op note from CABG/AVR 06/2017.     Past Surgical History:  Procedure Laterality Date  . AORTIC VALVE REPLACEMENT N/A 06/25/2017   Procedure: AORTIC VALVE REPLACEMENT (AVR) using Magna Ease Aortic Valve size 8mm;  Surgeon: Ivin Poot, MD;  Location: [place];  Service: Open Heart Surgery;  Laterality: N/A;  . arthritic cyst removal  2017   from Lumbar 4-5, dr pool  . CARDIAC CATHETERIZATION N/A 02/20/2016   Procedure: Right/Left Heart Cath and Coronary Angiography;  Surgeon: Belva Crome, MD;  Location: [place] [facility];  Service: Cardiovascular;  Laterality: N/A;  . CORONARY ARTERY BYPASS GRAFT N/A 06/25/2017   Procedure: CORONARY ARTERY BYPASS GRAFTING (CABG) x Two , using left internal mammary artery and right leg greater saphenous vein harvested endoscopically;  Surgeon: Ivin Poot, MD;  Location: [place];  Service: Open Heart Surgery;  Laterality: N/A;  . CORONARY/GRAFT ANGIOGRAPHY N/A  06/02/2017   Procedure: CORONARY/GRAFT ANGIOGRAPHY;  Surgeon: Nelva Bush, MD;  Location: [place] [facility];  Service: Cardiovascular;  Laterality: N/A;  . EYE SURGERY    . MELANOMA EXCISION  2011   left thigh  . RIGHT HEART CATH N/A 06/02/2017   Procedure: RIGHT HEART CATH;  Surgeon: Nelva Bush, MD;  Location: [place] [facility];  Service: Cardiovascular;  Laterality: N/A;  . TEE WITHOUT CARDIOVERSION N/A 06/25/2017   Procedure: TRANSESOPHAGEAL ECHOCARDIOGRAM (TEE);  Surgeon: Prescott Gum, Collier Salina, MD;  Location: [place];  Service: Open Heart Surgery;  Laterality: N/A;  . TONSILLECTOMY      Social History   Socioeconomic History  . Marital status: Married    Spouse name: [name]  . Number of children: 0  . Years of education: Assoc degree  . Highest education level: Not on file  Occupational History  . Occupation: Government social research officer - [employer]  Tobacco Use  . Smoking status: Former Smoker    Packs/day: 1.00    Quit date: 04/07/1976    Years since quitting: 44.1  . Smokeless tobacco: Former Systems developer  . Tobacco comment: quit in 78, teenager  Vaping Use  . Vaping Use: Never used  Substance  and Sexual Activity  . Alcohol use: Yes    Alcohol/week: 1.0 standard drink    Types: 1 Cans of beer per week    Comment: occasional  . Drug use: No    Comment: quit marijuana in 1990  . Sexual activity: Yes  Other Topics Concern  . Not on file  Social History Narrative   Lives with wife   Caffeine < 1 cup daily   Social Determinants of Health   Financial Resource Strain: Not on file  Food Insecurity: Not on file  Transportation Needs: Not on file  Physical Activity: Not on file  Stress: Not on file  Social Connections: Not on file  Intimate Partner Violence: Not on file     Family History  Problem Relation Age of Onset  . Diabetes Mother   . Heart disease Mother   . Arthritis Father   . Arthritis Sister   . Heart disease Paternal Grandmother        MI  . Heart attack Other         family history  . Pancreatic cancer Maternal Aunt      Current Outpatient Medications:  .  aspirin EC 81 MG tablet, Take 81 mg by mouth daily., Disp: , Rfl:  .  atorvastatin (LIPITOR) 80 MG tablet, TAKE 1 TABLET BY MOUTH DAILY AT 06:00PM, Disp: 90 tablet, Rfl: 2 .  cholecalciferol (VITAMIN D) 1000 units tablet, Take 1,000 Units by mouth 2 (two) times daily., Disp: , Rfl:  .  Coenzyme Q10 (COQ-10) 200 MG CAPS, Take 200 mg by mouth daily., Disp: , Rfl:  .  azelastine (ASTELIN) 0.1 % nasal spray, Place 1 spray into both nostrils 2 (two) times daily. Use in each nostril as directed (Patient not taking: Reported on 06/04/2020), Disp: 30 mL, Rfl: 2 .  fluorouracil (EFUDEX) 5 % cream, Apply topically 2 (two) times daily. (Patient not taking: Reported on 06/04/2020), Disp: 40 g, Rfl: 1 .  fluticasone (FLONASE) 50 MCG/ACT nasal spray, Place 2 sprays into both nostrils daily as needed (for allergies.). (Patient not taking: Reported on 06/04/2020), Disp: 48 g, Rfl: 3   Physical exam:  Vitals:   06/04/20 1340  BP: (!) 136/94  Pulse: 66  Resp: 18  Temp: (!) 96.6 F (35.9 C)  TempSrc: Tympanic  SpO2: 98%  Weight: 193 lb 11.2 oz (87.9 kg)   Physical Exam Constitutional:      General: He is not in acute distress. Eyes:     Extraocular Movements: EOM normal.  Cardiovascular:     Rate and Rhythm: Normal rate and regular rhythm.     Heart sounds: Normal heart sounds.  Pulmonary:     Effort: Pulmonary effort is normal.     Breath sounds: Normal breath sounds.  Abdominal:     General: Bowel sounds are normal.     Palpations: Abdomen is soft.     Comments: No palpable splenomegaly  Skin:    General: Skin is warm and dry.  Neurological:     Mental Status: He is alert and oriented to person, place, and time.        CMP Latest Ref Rng & Units 03/13/2020  Glucose 65 - 99 mg/dL -  BUN 7 - 25 mg/dL -  Creatinine 0.70 - 1.25 mg/dL -  Sodium 135 - 146 mmol/L -  Potassium 3.5 - 5.3 mmol/L  -  Chloride 98 - 110 mmol/L -  CO2 20 - 32 mmol/L -  Calcium 8.6 - 10.3  mg/dL -  Total Protein 6.1 - 8.1 g/dL 6.3  Total Bilirubin 0.2 - 1.2 mg/dL 1.5(H)  Alkaline Phos 39 - 117 IU/L -  AST 10 - 35 U/L 27  ALT 9 - 46 U/L 20   CBC Latest Ref Rng & Units 06/04/2020  WBC 4.0 - 10.5 K/uL 4.8  Hemoglobin 13.0 - 17.0 g/dL 15.8  Hematocrit 39.0 - 52.0 % 46.0  Platelets 150 - 400 K/uL 110(L)     Assessment and plan- Patient is a 65 y.o. male referred for thrombocytopenia  Patient has mild isolated thrombocytopenia with a platelet count fluctuates between 70s to 120s at least for the last 5 years.  Hemoglobin has remained normal.  Intermittent leukopenia but his white count has been normal for the most part.  Recent imaging studies do not support any significant cirrhosis that would explain thrombocytopenia.  Suspect he may have an element of ITP contributing to his thrombocytopenia.  I will check a CBC with differential, smear review, B12 folate HIV and hepatitis C.  Video visit with me in 3 weeks time   Thank you for this kind referral and the opportunity to participate in the care of this patient   Visit Diagnosis 1. Thrombocytopenia ([place])     Dr. Randa Evens, MD, MPH [facility] [phone] 06/04/2020  4:19 PM

## 2020-06-07 DIAGNOSIS — F411 Generalized anxiety disorder: Secondary | ICD-10-CM | POA: Diagnosis not present

## 2020-06-25 ENCOUNTER — Encounter: Payer: Self-pay | Admitting: Oncology

## 2020-06-25 ENCOUNTER — Inpatient Hospital Stay: Payer: Medicare PPO | Attending: Oncology | Admitting: Oncology

## 2020-06-25 ENCOUNTER — Other Ambulatory Visit: Payer: Self-pay

## 2020-06-25 DIAGNOSIS — E538 Deficiency of other specified B group vitamins: Secondary | ICD-10-CM

## 2020-06-25 DIAGNOSIS — D696 Thrombocytopenia, unspecified: Secondary | ICD-10-CM | POA: Diagnosis not present

## 2020-06-25 NOTE — Progress Notes (Signed)
I connected with Todd Frank on 06/25/20 at  2:00 PM EDT by video enabled telemedicine visit and verified that I am speaking with the correct person using two identifiers.   I discussed the limitations, risks, security and privacy concerns of performing an evaluation and management service by telemedicine and the availability of in-person appointments. I also discussed with the patient that there may be a patient responsible charge related to this service. The patient expressed understanding and agreed to proceed.  Other persons participating in the visit and their role in the encounter:  none  Patient's location:  home Provider's location:  work  Risk analyst Complaint: Discuss results of blood work  History of present illness: patient is a 64 year old male with a past medical history significant for coronary artery disease, aortic stenosis, anxiety disorder, obstructive sleep apnea who has been referred to [place] for thrombocytopenia.Most recent CBC from 02/13/2020 showed white cell count of 3.7, H&H of 16/47.6 and a platelet count of 113.  Looking back at her prior CBCs his platelet count has fluctuated between 70s to 120s at least since 2017.  He was seen by Dr. Allen Norris for elevated bilirubin.  It is thought to be due to possible Gilbert's syndrome.  No known cirrhosis based on imaging.  Denies any easy bleeding or bruising. Notes that in one of his CBC's there was a mention of clumping of platelets  Results of blood work from 06/04/2020 were as follows: CBC showed white count of 4.8, H&H of 15.8/46 and a platelet count of 110.  B12 levels were low at 186.  HIV and hepatitis C unremarkable.  Folate normal.  Smear review unremarkable  Interval history: Patient reports No changes in his health in the last 3 weeks.  Denies any bleeding or bruising.   Review of Systems  Constitutional: Negative for chills, fever, malaise/fatigue and weight loss.  HENT: Negative for congestion, ear discharge and nosebleeds.    Eyes: Negative for blurred vision.  Respiratory: Negative for cough, hemoptysis, sputum production, shortness of breath and wheezing.   Cardiovascular: Negative for chest pain, palpitations, orthopnea and claudication.  Gastrointestinal: Negative for abdominal pain, blood in stool, constipation, diarrhea, heartburn, melena, nausea and vomiting.  Genitourinary: Negative for dysuria, flank pain, frequency, hematuria and urgency.  Musculoskeletal: Negative for back pain, joint pain and myalgias.  Skin: Negative for rash.  Neurological: Negative for dizziness, tingling, focal weakness, seizures, weakness and headaches.  Endo/Heme/Allergies: Does not bruise/bleed easily.  Psychiatric/Behavioral: Negative for depression and suicidal ideas. The patient does not have insomnia.     No Known Allergies  Past Medical History:  Diagnosis Date  . Aortic stenosis    a. syncope/severe AS s/p bioprosthetic AVR 06/2017.  . Arthritis   . Ascending aorta dilatation (HCC)    77mm by echo 11/2019  . Bicuspid aortic valve   . Bradycardia    secondary to BB  . Broken clavicle    as child, can still dislocate at times  . CAD (coronary artery disease), native coronary artery    a. s/p 2V CABG at time of AVR 06/2017.  . Carotid bruit    carotid dopplers negative.  Bruit due to heart murmur from AS  . Congenital nevus   . Diastolic dysfunction   . Dysplastic nevus 12/12/2008   Left mid back, 9cm lat. to spine. Moderate atypia, extends to one edge.   Marland Kitchen Dysplastic nevus 12/12/2008   Left mid to low back, 7cm lat. to spine. Moderate atypia, close to margin.   Marland Kitchen  Dysplastic nevus 09/24/2012   Right mid lateral buttocks. Moderate atypia, close to edge.   Marland Kitchen GERD (gastroesophageal reflux disease)   . Headache    optical migraine  . Hx of melanoma in situ 06/26/2009   Left superior thigh. Arising in dysplastic nevus, close to margin. Excised 07/11/2009, margins free.  Marland Kitchen Hyperlipidemia    LDL goal < 70  . LVH  (left ventricular hypertrophy)   . Melanoma of skin ([place])   . Obesity   . PFO (patent foramen ovale)    small by echo 2014, but not demonstrated during intra-op note from CABG/AVR 06/2017.    Past Surgical History:  Procedure Laterality Date  . AORTIC VALVE REPLACEMENT N/A 06/25/2017   Procedure: AORTIC VALVE REPLACEMENT (AVR) using Magna Ease Aortic Valve size 62mm;  Surgeon: Ivin Poot, MD;  Location: [facility];  Service: Open Heart Surgery;  Laterality: N/A;  . arthritic cyst removal  2017   from Lumbar 4-5, dr pool  . CARDIAC CATHETERIZATION N/A 02/20/2016   Procedure: Right/Left Heart Cath and Coronary Angiography;  Surgeon: Belva Crome, MD;  Location: [facility];  Service: Cardiovascular;  Laterality: N/A;  . CORONARY ARTERY BYPASS GRAFT N/A 06/25/2017   Procedure: CORONARY ARTERY BYPASS GRAFTING (CABG) x Two , using left internal mammary artery and right leg greater saphenous vein harvested endoscopically;  Surgeon: Ivin Poot, MD;  Location: [facility];  Service: Open Heart Surgery;  Laterality: N/A;  . CORONARY/GRAFT ANGIOGRAPHY N/A 06/02/2017   Procedure: CORONARY/GRAFT ANGIOGRAPHY;  Surgeon: Nelva Bush, MD;  Location: [facility];  Service: Cardiovascular;  Laterality: N/A;  . EYE SURGERY    . MELANOMA EXCISION  2011   left thigh  . RIGHT HEART CATH N/A 06/02/2017   Procedure: RIGHT HEART CATH;  Surgeon: Nelva Bush, MD;  Location: [facility];  Service: Cardiovascular;  Laterality: N/A;  . TEE WITHOUT CARDIOVERSION N/A 06/25/2017   Procedure: TRANSESOPHAGEAL ECHOCARDIOGRAM (TEE);  Surgeon: Prescott Gum, Collier Salina, MD;  Location: [facility];  Service: Open Heart Surgery;  Laterality: N/A;  . TONSILLECTOMY      Social History   Socioeconomic History  . Marital status: Married    Spouse name: [name]  . Number of children: 0  . Years of education: Assoc degree  . Highest education level: Not on file  Occupational History  . Occupation: Clinical cytogeneticist - [employer]  Tobacco Use  . Smoking status: Former Smoker    Packs/day: 1.00    Quit date: 04/07/1976    Years since quitting: 44.2  . Smokeless tobacco: Former Systems developer  . Tobacco comment: quit in 78, teenager  Vaping Use  . Vaping Use: Never used  Substance and Sexual Activity  . Alcohol use: Yes    Alcohol/week: 1.0 standard drink    Types: 1 Cans of beer per week    Comment: occasional  . Drug use: No    Comment: quit marijuana in 1990  . Sexual activity: Yes  Other Topics Concern  . Not on file  Social History Narrative   Lives with wife   Caffeine < 1 cup daily   Social Determinants of Health   Financial Resource Strain: Not on file  Food Insecurity: Not on file  Transportation Needs: Not on file  Physical Activity: Not on file  Stress: Not on file  Social Connections: Not on file  Intimate Partner Violence: Not on file    Family History  Problem Relation Age of Onset  .  Diabetes Mother   . Heart disease Mother   . Arthritis Father   . Arthritis Sister   . Heart disease Paternal Grandmother        MI  . Heart attack Other        family history  . Pancreatic cancer Maternal Aunt      Current Outpatient Medications:  .  aspirin EC 81 MG tablet, Take 81 mg by mouth daily., Disp: , Rfl:  .  atorvastatin (LIPITOR) 80 MG tablet, TAKE 1 TABLET BY MOUTH DAILY AT 06:00PM, Disp: 90 tablet, Rfl: 2 .  azelastine (ASTELIN) 0.1 % nasal spray, Place 1 spray into both nostrils 2 (two) times daily. Use in each nostril as directed, Disp: 30 mL, Rfl: 2 .  cholecalciferol (VITAMIN D) 1000 units tablet, Take 1,000 Units by mouth 2 (two) times daily., Disp: , Rfl:  .  Coenzyme Q10 (COQ-10) 200 MG CAPS, Take 200 mg by mouth daily., Disp: , Rfl:  .  fluticasone (FLONASE) 50 MCG/ACT nasal spray, Place 2 sprays into both nostrils daily as needed (for allergies.)., Disp: 48 g, Rfl: 3  No results found.  No images are attached to the encounter.   CMP Latest Ref Rng & Units  03/13/2020  Glucose 65 - 99 mg/dL -  BUN 7 - 25 mg/dL -  Creatinine 0.70 - 1.25 mg/dL -  Sodium 135 - 146 mmol/L -  Potassium 3.5 - 5.3 mmol/L -  Chloride 98 - 110 mmol/L -  CO2 20 - 32 mmol/L -  Calcium 8.6 - 10.3 mg/dL -  Total Protein 6.1 - 8.1 g/dL 6.3  Total Bilirubin 0.2 - 1.2 mg/dL 1.5(H)  Alkaline Phos 39 - 117 IU/L -  AST 10 - 35 U/L 27  ALT 9 - 46 U/L 20   CBC Latest Ref Rng & Units 06/04/2020  WBC 4.0 - 10.5 K/uL 4.8  Hemoglobin 13.0 - 17.0 g/dL 15.8  Hematocrit 39.0 - 52.0 % 46.0  Platelets 150 - 400 K/uL 110(L)     Observation/objective: Appears in no acute distress over video visit today.  Breathing is nonlabored   Assessment and plan: Patient is a 65 year old male with isolated thrombocytopenia possibly secondary to ITP  Patient has had mild isolated thrombocytopenia with a platelet count of fluctuates between 110s to 120s at least dating back to 2018.  No known liver disease.Suspect he has mild ITP which can be monitored without require any treatment at this time.  Treatment will be considered should his platelet counts returned to less than 30.  He was also noted to have a mildly low B12 level of 186.  I would recommend that patient should take over-the-counter B12 tablets 1000 mcg daily although it is unclear if his platelet count will improve following that.  However low B12 levels have been associated with thrombocytopenia.  Repeat CBC with differential in 6 months in 1 year and I will see him back in 1 year and check his B12 level in 6 months as well.  Follow-up instructions: As above  I discussed the assessment and treatment plan with the patient. The patient was provided an opportunity to ask questions and all were answered. The patient agreed with the plan and demonstrated an understanding of the instructions.   The patient was advised to call back or seek an in-person evaluation if the symptoms worsen or if the condition fails to improve as  anticipated.   Visit Diagnosis: 1. Thrombocytopenia ([place])     Dr. Randa Evens,  MD, MPH [facility] [facility] Tel- [phone] 06/25/2020 3:06 PM

## 2020-06-25 NOTE — Progress Notes (Signed)
Patient denies any concerns today.  

## 2020-06-27 ENCOUNTER — Encounter: Payer: Self-pay | Admitting: Podiatry

## 2020-07-11 DIAGNOSIS — F411 Generalized anxiety disorder: Secondary | ICD-10-CM | POA: Diagnosis not present

## 2020-07-16 ENCOUNTER — Ambulatory Visit: Payer: Medicare PPO | Admitting: Podiatry

## 2020-07-16 ENCOUNTER — Other Ambulatory Visit: Payer: Self-pay

## 2020-07-16 ENCOUNTER — Encounter: Payer: Self-pay | Admitting: Podiatry

## 2020-07-16 DIAGNOSIS — B070 Plantar wart: Secondary | ICD-10-CM

## 2020-07-16 NOTE — Patient Instructions (Signed)

## 2020-07-16 NOTE — Progress Notes (Signed)
Presents today for follow-up of the wart plantar aspect of the left foot.  States that the Todd Frank is gone away without any problems they a large one just sits there does nothing.  Objective: Vital signs are stable he is alert oriented x3 there appears to really be no change in the larger of the warts the smaller one is practically gone.  Thrombosed capillaries are visible  Assessment verruca plantaris large 1 is still intact [name] has gone away.  Plan: Discussed etiology pathology conservative surgical therapies this point time we performed a curettage of the wart left.  We did this today after local anesthetic 1 and half cc was injected sublesional he.  Tolerated procedure well.  He received both oral and written home-going instruction for the care and soaking of the foot I will follow-up with him in a couple weeks to make sure he is doing well.

## 2020-07-30 ENCOUNTER — Ambulatory Visit (INDEPENDENT_AMBULATORY_CARE_PROVIDER_SITE_OTHER): Payer: Medicare PPO | Admitting: Podiatry

## 2020-07-30 ENCOUNTER — Encounter: Payer: Self-pay | Admitting: Podiatry

## 2020-07-30 ENCOUNTER — Other Ambulatory Visit: Payer: Self-pay

## 2020-07-30 DIAGNOSIS — B070 Plantar wart: Secondary | ICD-10-CM

## 2020-07-30 NOTE — Progress Notes (Signed)
He presents today for follow-up of his excision wart plantar aspect left foot central arch.  States is doing fine continues to soak it and put some Neosporin on it.  Objective: Vital signs are stable he is alert and oriented x3.  Pulses are palpable.  There is no edema some mild erythema no cellulitis drainage or odor.  The wound is still open to it about 2 mm in total diameter.  Granulation tissue and epithelialization are occurring.  Assessment: Well-healing verrucoid lesion excision.  Plan: Recommend he continue to do exactly what he has been doing.  Should his pathology come back abnormal we will notify him immediately.  As of today at 4:30 PM it has not returned.

## 2020-08-05 ENCOUNTER — Other Ambulatory Visit: Payer: Self-pay | Admitting: Cardiology

## 2020-08-13 ENCOUNTER — Encounter: Payer: Self-pay | Admitting: Podiatry

## 2020-08-15 ENCOUNTER — Ambulatory Visit: Payer: Medicare PPO | Admitting: Dermatology

## 2020-08-15 ENCOUNTER — Other Ambulatory Visit: Payer: Self-pay

## 2020-08-15 DIAGNOSIS — D225 Melanocytic nevi of trunk: Secondary | ICD-10-CM | POA: Diagnosis not present

## 2020-08-15 DIAGNOSIS — D1723 Benign lipomatous neoplasm of skin and subcutaneous tissue of right leg: Secondary | ICD-10-CM | POA: Diagnosis not present

## 2020-08-15 DIAGNOSIS — L578 Other skin changes due to chronic exposure to nonionizing radiation: Secondary | ICD-10-CM | POA: Diagnosis not present

## 2020-08-15 DIAGNOSIS — L820 Inflamed seborrheic keratosis: Secondary | ICD-10-CM | POA: Diagnosis not present

## 2020-08-15 DIAGNOSIS — D492 Neoplasm of unspecified behavior of bone, soft tissue, and skin: Secondary | ICD-10-CM

## 2020-08-15 NOTE — Patient Instructions (Addendum)
If you have any questions or concerns for your doctor, please call our main line at [phone] and press option 4 to reach your doctor's medical assistant. If no one answers, please leave a voicemail as directed and we will return your call as soon as possible. Messages left after 4 pm will be answered the following business day.   You may also send us a message via MyChart. We typically respond to MyChart messages within 1-2 business days.  For prescription refills, please ask your pharmacy to contact our office. Our fax number is [phone].  If you have an urgent issue when the clinic is closed that cannot wait until the next business day, you can page your doctor at the number below.    Please note that while we do our best to be available for urgent issues outside of office hours, we are not available 24/7.   If you have an urgent issue and are unable to reach us, you may choose to seek medical care at your doctor's office, retail clinic, urgent care center, or emergency room.  If you have a medical emergency, please immediately call 911 or go to the emergency department.  Pager Numbers  - Dr. Kowalski: [phone]  - Dr. Moye: [phone]  - Dr. Stewart: [phone]  In the event of inclement weather, please call our main line at [phone] for an update on the status of any delays or closures.  Dermatology Medication Tips: Please keep the boxes that topical medications come in in order to help keep track of the instructions about where and how to use these. Pharmacies typically print the medication instructions only on the boxes and not directly on the medication tubes.   If your medication is too expensive, please contact our office at [phone] option 4 or send us a message through MyChart.   We are unable to tell what your co-pay for medications will be in advance as this is different depending on your insurance coverage. However, we may be able to find a  substitute medication at lower cost or fill out paperwork to get insurance to cover a needed medication.   If a prior authorization is required to get your medication covered by your insurance company, please allow us 1-2 business days to complete this process.  Drug prices often vary depending on where the prescription is filled and some pharmacies may offer cheaper prices.  The website www.goodrx.com contains coupons for medications through different pharmacies. The prices here do not account for what the cost may be with help from insurance (it may be cheaper with your insurance), but the website can give you the price if you did not use any insurance.  - You can print the associated coupon and take it with your prescription to the pharmacy.  - You may also stop by our office during regular business hours and pick up a GoodRx coupon card.  - If you need your prescription sent electronically to a different pharmacy, notify our office through [facility] MyChart or by phone at [phone] option 4.     Wound Care Instructions  1. Cleanse wound gently with soap and water once a day then pat dry with clean gauze. Apply a thing coat of Petrolatum (petroleum jelly, "Vaseline") over the wound (unless you have an allergy to this). We recommend that you use a new, sterile tube of Vaseline. Do not pick or remove scabs. Do not remove the yellow or white "healing tissue" from the base of the wound.    Cover the wound with fresh, clean, nonstick gauze and secure with paper tape. You may use Band-Aids in place of gauze and tape if the would is small enough, but would recommend trimming much of the tape off as there is often too much. Sometimes Band-Aids can irritate the skin.  3. You should call the office for your biopsy report after 1 week if you have not already been contacted.  4. If you experience any problems, such as abnormal amounts of bleeding, swelling, significant bruising, significant pain, or evidence of  infection, please call the office immediately.  5. FOR ADULT SURGERY PATIENTS: If you need something for pain relief you may take 1 extra strength Tylenol (acetaminophen) AND 2 Ibuprofen (200mg  each) together every 4 hours as needed for pain. (do not take these if you are allergic to them or if you have a reason you should not take them.) Typically, you may only need pain medication for 1 to 3 days.      Pre-Operative Instructions  You are scheduled for a surgical procedure at [facility]. We recommend you read the following instructions. If you have any questions or concerns, please call the office at [phone].  1. Shower and wash the entire body with soap and water the day of your surgery paying special attention to cleansing at and around the planned surgery site.  2. Avoid aspirin or aspirin containing products at least fourteen (14) days prior to your surgical procedure and for at least one week (7 Days) after your surgical procedure. If you take aspirin on a regular basis for heart disease or history of stroke or for any other reason, we may recommend you continue taking aspirin but please notify us if you take this on a regular basis. Aspirin can cause more bleeding to occur during surgery as well as prolonged bleeding and bruising after surgery.   3. Avoid other nonsteroidal pain medications at least one week prior to surgery and at least one week prior to your surgery. These include medications such as Ibuprofen (Motrin, Advil and Nuprin), Naprosyn, Voltaren, Relafen, etc. If medications are used for therapeutic reasons, please inform us as they can cause increased bleeding or prolonged bleeding during and bruising after surgical procedures.   4. Please advise [place] if you are taking any "blood thinner" medications such as Coumadin or Dipyridamole or Plavix or similar medications. These cause increased bleeding and prolonged bleeding during procedures and bruising after surgical  procedures. We may have to consider discontinuing these medications briefly prior to and shortly after your surgery if safe to do so.   5. Please inform us of all medications you are currently taking. All medications that are taken regularly should be taken the day of surgery as you always do. Nevertheless, we need to be informed of what medications you are taking prior to surgery to know whether they will affect the procedure or cause any complications.   6. Please inform us of any medication allergies. Also inform us of whether you have allergies to Latex or rubber products or whether you have had any adverse reaction to Lidocaine or Epinephrine.  7. Please inform us of any prosthetic or artificial body parts such as artificial heart valve, joint replacements, etc., or similar condition that might require preoperative antibiotics.   8. We recommend avoidance of alcohol at least two weeks prior to surgery and continued avoidance for at least two weeks after surgery.   9. We recommend discontinuation of tobacco smoking at least  two weeks prior to surgery and continued abstinence for at least two weeks after surgery.  10. Do not plan strenuous exercise, strenuous work or strenuous lifting for approximately four weeks after your surgery.   11. We request if you are unable to make your scheduled surgical appointment, please call us at least a week in advance or as soon as you are aware of a problem so that we can cancel or reschedule the appointment.   12. You MAY TAKE TYLENOL (acetaminophen) for pain as it is not a blood thinner.   13. PLEASE PLAN TO BE IN TOWN FOR TWO WEEKS FOLLOWING SURGERY, THIS IS IMPORTANT SO YOU CAN BE CHECKED FOR DRESSING CHANGES, SUTURE REMOVAL AND TO MONITOR FOR POSSIBLE COMPLICATIONS.  14.

## 2020-08-15 NOTE — Progress Notes (Signed)
   Follow-Up Visit   Subjective  Todd Frank is a 65 y.o. male who presents for the following: Nevus (L shoulder, itchy, has had >1 yr/Back, dark) and check growth (R thigh, 3wks).  The following portions of the chart were reviewed this encounter and updated as appropriate:   Tobacco  Allergies  Meds  Problems  Med Hx  Surg Hx  Fam Hx     Review of Systems:  No other skin or systemic complaints except as noted in HPI or Assessment and Plan.  Objective  Well appearing patient in no apparent distress; mood and affect are within normal limits.  A focused examination was performed including L shoulder, back, R thigh. Relevant physical exam findings are noted in the Assessment and Plan.  Objective  L mid back infrascapular: 0.6cm irregular brown macule  Objective  L post shoulder x 1, L ant shoulder x 2 (3): Erythematous keratotic or waxy stuck-on papule or plaque.   Objective  R ant thigh: Rubbery nodule 1.0cm   Assessment & Plan  Neoplasm of skin L mid back infrascapular Epidermal / dermal shaving  Lesion diameter (cm):  0.6 Informed consent: discussed and consent obtained   Timeout: patient name, date of birth, surgical site, and procedure verified   Procedure prep:  Patient was prepped and draped in usual sterile fashion Prep type:  Isopropyl alcohol Anesthesia: the lesion was anesthetized in a standard fashion   Anesthetic:  1% lidocaine w/ epinephrine 1-100,000 buffered w/ 8.4% NaHCO3 Instrument used: flexible razor blade   Hemostasis achieved with: pressure, aluminum chloride and electrodesiccation   Outcome: patient tolerated procedure well   Post-procedure details: sterile dressing applied and wound care instructions given   Dressing type: bandage and bacitracin    Specimen 1 - Surgical pathology Differential Diagnosis: D48.5 Nevus vs Dysplastic Nevus  Check Margins: yes 0.6cm irregular brown macule  Inflamed seborrheic keratosis (3) L post shoulder  x 1, L ant shoulder x 2 Destruction of lesion - L post shoulder x 1, L ant shoulder x 2 Complexity: simple   Destruction method: cryotherapy   Informed consent: discussed and consent obtained   Timeout:  patient name, date of birth, surgical site, and procedure verified Lesion destroyed using liquid nitrogen: Yes   Region frozen until ice ball extended beyond lesion: Yes   Outcome: patient tolerated procedure well with no complications   Post-procedure details: wound care instructions given    Lipoma of right lower extremity R ant thigh Vs Cyst Benign Appearing, will plan to excise Return for surgery Lipoma R ant thigh.  Actinic Damage - chronic, secondary to cumulative UV radiation exposure/sun exposure over time - diffuse scaly erythematous macules with underlying dyspigmentation - Recommend daily broad spectrum sunscreen SPF 30+ to sun-exposed areas, reapply every 2 hours as needed.  - Recommend staying in the shade or wearing long sleeves, sun glasses (UVA+UVB protection) and wide brim hats (4-inch brim around the entire circumference of the hat). - Call for new or changing lesions.  I, Othelia Pulling, RMA, am acting as scribe for Sarina Ser, MD .  Documentation: I have reviewed the above documentation for accuracy and completeness, and I agree with the above.  Sarina Ser, MD

## 2020-08-21 ENCOUNTER — Encounter: Payer: Self-pay | Admitting: Dermatology

## 2020-08-22 ENCOUNTER — Telehealth: Payer: Self-pay

## 2020-08-22 DIAGNOSIS — G4733 Obstructive sleep apnea (adult) (pediatric): Secondary | ICD-10-CM | POA: Diagnosis not present

## 2020-08-22 NOTE — Telephone Encounter (Signed)
-----   Message from Ralene Bathe, MD sent at 08/20/2020 12:28 PM EDT ----- Diagnosis Skin , left mid back infrascapular DYSPLASTIC COMPOUND NEVUS WITH SEVERE ATYPIA AND HALO NEVUS EFFECT, CLOSE TO MARGIN,  Severe dysplastic Schedule surgery

## 2020-08-22 NOTE — Telephone Encounter (Signed)
Advised patient of results and scheduled surgery/hd

## 2020-08-27 ENCOUNTER — Other Ambulatory Visit: Payer: Self-pay

## 2020-08-27 ENCOUNTER — Encounter: Payer: Self-pay | Admitting: Dermatology

## 2020-08-27 ENCOUNTER — Ambulatory Visit: Payer: Medicare PPO | Admitting: Cardiology

## 2020-08-27 ENCOUNTER — Encounter: Payer: Self-pay | Admitting: Cardiology

## 2020-08-27 VITALS — BP 134/84 | HR 61 | Ht 69.5 in | Wt 179.8 lb

## 2020-08-27 DIAGNOSIS — I350 Nonrheumatic aortic (valve) stenosis: Secondary | ICD-10-CM | POA: Diagnosis not present

## 2020-08-27 DIAGNOSIS — I10 Essential (primary) hypertension: Secondary | ICD-10-CM | POA: Diagnosis not present

## 2020-08-27 DIAGNOSIS — I7121 Aneurysm of the ascending aorta, without rupture: Secondary | ICD-10-CM

## 2020-08-27 DIAGNOSIS — E7800 Pure hypercholesterolemia, unspecified: Secondary | ICD-10-CM

## 2020-08-27 DIAGNOSIS — G4733 Obstructive sleep apnea (adult) (pediatric): Secondary | ICD-10-CM

## 2020-08-27 DIAGNOSIS — I2510 Atherosclerotic heart disease of native coronary artery without angina pectoris: Secondary | ICD-10-CM | POA: Diagnosis not present

## 2020-08-27 DIAGNOSIS — I712 Thoracic aortic aneurysm, without rupture: Secondary | ICD-10-CM | POA: Diagnosis not present

## 2020-08-27 MED ORDER — ATORVASTATIN CALCIUM 80 MG PO TABS
80.0000 mg | ORAL_TABLET | Freq: Every day | ORAL | 3 refills | Status: DC
Start: 2020-08-27 — End: 2021-08-07

## 2020-08-27 NOTE — Addendum Note (Signed)
Addended by: Antonieta Iba on: 08/27/2020 10:42 AM   Modules accepted: Orders

## 2020-08-27 NOTE — Progress Notes (Addendum)
Date:  08/27/2020   ID:  Denorris, Reust 1955/06/27, MRN [id number]  PCP:  Olin Hauser, DO  Cardiologist:  [name], MD  Electrophysiologist:  None  Chief Complaint:  CAD, bicuspid AV, OSA, HTN, HLD  History of Present Illness:    Todd Frank is a 65 y.o. male with a hx of HTN, dyslipidemia, bicuspid AV, diastolic dysfunction,dilated aorta, noted prior small PFO and 2 vessel CAD. Hewas found to have progression of AS after a syncopal episode while jogging.Echocardiogram was obtained and showed an increased gradient across his bicuspid aortic valve which had progressed from previous study.Cardiac catheterization was also performed and showed severe 2 vessel CAD. He underwent CABG x 2 utilizing LIMA to LAD, and SVG to distal PDA, AVRwith a 23 mm [employer]. Post op echo showed LVEF 50-55%, no pericardial effusion, no regional wall motion abnormalities and stable AVR.   Due to excessive daytime sleepiness he was also found to have mild obstructive sleep apnea with an AHI of 6.5/h and no significant central sleep apnea and is on CPAP.  When I last saw him he continued to complain of chronic chest pain that had been present ever since his CABG.  ESR and CRP were normal and Lexiscan myoview showed no ischemia.    He is here today for followup and is doing well.  He denies any chest pain or pressure, SOB, DOE, PND, orthopnea, LE edema, dizziness (except when standing up too fast), palpitations or syncope. He is compliant with his meds and is tolerating meds with no SE.    He is doing well with his PAP device and thinks that he has gotten used to it.  He tolerates the mask and feels the pressure is adequate.  Since going on PAP he feels rested in the am and has no significant daytime sleepiness.  He denies any significant mouth or nasal dryness or nasal congestion.  He does not think that he snores.     Prior CV studies:   The following studies were reviewed  today:  PAP compliance download, Leane Call,  EKG  Past Medical History:  Diagnosis Date  . Aortic stenosis    a. syncope/severe AS s/p bioprosthetic AVR 06/2017.  . Arthritis   . Ascending aorta dilatation (HCC)    76m by echo 11/2019  . Bicuspid aortic valve   . Bradycardia    secondary to BB  . Broken clavicle    as child, can still dislocate at times  . CAD (coronary artery disease), native coronary artery    a. s/p 2V CABG at time of AVR 06/2017.  . Carotid bruit    carotid dopplers negative.  Bruit due to heart murmur from AS  . Congenital nevus   . Diastolic dysfunction   . Dysplastic nevus 12/12/2008   Left mid back, 9cm lat. to spine. Moderate atypia, extends to one edge.   .Marland KitchenDysplastic nevus 12/12/2008   Left mid to low back, 7cm lat. to spine. Moderate atypia, close to margin.   .Marland KitchenDysplastic nevus 09/24/2012   Right mid lateral buttocks. Moderate atypia, close to edge.   .Marland KitchenDysplastic nevus 08/15/2020   Left mid back infrascapular - needs excision  . GERD (gastroesophageal reflux disease)   . Headache    optical migraine  . Hx of melanoma in situ 06/26/2009   Left superior thigh. Arising in dysplastic nevus, close to margin. Excised 07/11/2009, margins free.  .Marland KitchenHyperlipidemia  LDL goal < 70  . LVH (left ventricular hypertrophy)   . Melanoma of skin ([place])   . Obesity   . PFO (patent foramen ovale)    small by echo 2014, but not demonstrated during intra-op note from CABG/AVR 06/2017.   Past Surgical History:  Procedure Laterality Date  . AORTIC VALVE REPLACEMENT N/A 06/25/2017   Procedure: AORTIC VALVE REPLACEMENT (AVR) using Magna Ease Aortic Valve size 3m;  Surgeon: VIvin Poot MD;  [facility]  Service: Open Heart Surgery;  Laterality: N/A;  . arthritic cyst removal  2017   from Lumbar 4-5, dr pool  . CARDIAC CATHETERIZATION N/A 02/20/2016   Procedure: Right/Left Heart Cath and Coronary Angiography;  Surgeon: HBelva Crome MD;  [facility];  Service: Cardiovascular;  Laterality: N/A;  . CORONARY ARTERY BYPASS GRAFT N/A 06/25/2017   Procedure: CORONARY ARTERY BYPASS GRAFTING (CABG) x Two , using left internal mammary artery and right leg greater saphenous vein harvested endoscopically;  Surgeon: VIvin Poot MD;  [facility]  Service: Open Heart Surgery;  Laterality: N/A;  . CORONARY/GRAFT ANGIOGRAPHY N/A 06/02/2017   Procedure: CORONARY/GRAFT ANGIOGRAPHY;  Surgeon: ENelva Bush MD;  [facility];  Service: Cardiovascular;  Laterality: N/A;  . EYE SURGERY    . MELANOMA EXCISION  2011   left thigh  . RIGHT HEART CATH N/A 06/02/2017   Procedure: RIGHT HEART CATH;  Surgeon: ENelva Bush MD;  [facility];  Service: Cardiovascular;  Laterality: N/A;  . TEE WITHOUT CARDIOVERSION N/A 06/25/2017   Procedure: TRANSESOPHAGEAL ECHOCARDIOGRAM (TEE);  Surgeon: VPrescott Gum PCollier Salina MD;  [facility]  Service: Open Heart Surgery;  Laterality: N/A;  . TONSILLECTOMY       Current Meds  Medication Sig  . aspirin EC 81 MG tablet Take 81 mg by mouth daily.  .Marland Kitchenatorvastatin (LIPITOR) 80 MG tablet Take 1 tablet (80 mg total) by mouth daily.  .Marland Kitchenazelastine (ASTELIN) 0.1 % nasal spray Place 1 spray into both nostrils 2 (two) times daily. Use in each nostril as directed  . cholecalciferol (VITAMIN D) 1000 units tablet Take 1,000 Units by mouth 2 (two) times daily.  . Coenzyme Q10 (COQ-10) 200 MG CAPS Take 200 mg by mouth daily.  . fluticasone (FLONASE) 50 MCG/ACT nasal spray Place 2 sprays into both nostrils daily as needed (for allergies.).  .Marland Kitchenvitamin B-12 (CYANOCOBALAMIN) 1000 MCG tablet Take 1,000 mcg by mouth daily.     Allergies:   Patient has no known allergies.   Social History   Tobacco Use  . Smoking status: Former Smoker    Packs/day: 1.00    Quit date: 04/07/1976    Years since quitting: 44.4  . Smokeless tobacco: Former [employer] . Tobacco comment: quit in 78,  teenager  Vaping Use  . Vaping Use: Never used  Substance Use Topics  . Alcohol use: Yes    Alcohol/week: 1.0 standard drink    Types: 1 Cans of beer per week    Comment: occasional  . Drug use: No    Comment: quit marijuana in 1990     Family Hx: The patient's family history includes Arthritis in his father and sister; Diabetes in his mother; Heart attack in an other family member; Heart disease in his mother and paternal grandmother; Pancreatic cancer in his maternal aunt.  ROS:   Please see the history of present illness.     All other systems reviewed and  are negative.   Labs/Other Tests and Data Reviewed:    Recent Labs: 02/13/2020: BUN 12; Creat 0.81; Potassium 4.3; Sodium 140 03/13/2020: ALT 20 06/04/2020: Hemoglobin 15.8; Platelets 110   Recent Lipid Panel Lab Results  Component Value Date/Time   CHOL 110 02/13/2020 08:13 AM   CHOL 115 02/22/2019 09:28 AM   CHOL 173 11/02/2015 12:00 AM   TRIG 82 02/13/2020 08:13 AM   TRIG 69 11/02/2015 12:00 AM   HDL 55 02/13/2020 08:13 AM   HDL 56 02/22/2019 09:28 AM   CHOLHDL 2.0 02/13/2020 08:13 AM   LDLCALC 39 02/13/2020 08:13 AM   LDLCALC 104 (A) 11/02/2015 12:00 AM    Wt Readings from Last 3 Encounters:  08/27/20 179 lb 12.8 oz (81.6 kg)  06/04/20 193 lb 11.2 oz (87.9 kg)  05/22/20 192 lb 12.8 oz (87.5 kg)     Objective:    Vital Signs:  BP 134/84   Pulse 61   Ht 5' 9.5" (1.765 m)   Wt 179 lb 12.8 oz (81.6 kg)   BMI 26.17 kg/m    GEN: Well nourished, well developed in no acute distress HEENT: Normal NECK: No JVD; No carotid bruits LYMPHATICS: No lymphadenopathy CARDIAC:RRR, norubs, gallops.  2/6 SM at RUSB RESPIRATORY:  Clear to auscultation without rales, wheezing or rhonchi  ABDOMEN: Soft, non-tender, non-distended MUSCULOSKELETAL:  No edema; No deformity  SKIN: Warm and dry NEUROLOGIC:  Alert and oriented x 3 PSYCHIATRIC:  Normal affect   EKG done in office and reviewed showing NSR with inferior  infarct and nonspecific T wave abnormality  ASSESSMENT & PLAN:     1.  OSA - The patient is tolerating PAP therapy well without any problems. The PAP download performed by his DME was personally reviewed and interpreted by me today and showed an AHI of 1.1/hr on auto PAP cm H2O with 97% compliance in using more than 4 hours nightly.  The patient has been using and benefiting from PAP use and will continue to benefit from therapy.   2.  Bicuspid AV with severe AS -s/p  AVR with a 23 mm [employer] magna ease AVR 06/2017 -2D echo 11/2018 showed a stable AVR with mean aortic valve gradient essentially unchanged at 13 mmHg  -repeat 2D echo to make sure AVR is  -continue ASA -continue ongoing SBE prophylaxis for dental procedures  3.  ASCAD  -cath 2019 showed severe 2 vessel CAD  -s/p CABG x 2 utilizing LIMA to LAD, and SVG to distal PDA.   -he has continued to have unexplained chest pain ever since his heart surgery -CRP and ESR were normal and no  pericardial effusion and no thickening of the pericardium -Lexiscan myoview showed no ischemia 08/2019 -over the past year his chronic CP seems to have resolved -continue ASA and statin    4.  Dilated ascending aorta  - this is stable at 39 mm by echo 07/25/2017 and 71m by echo 11/2018 -repeat echo 11/2019 -BP is very well controlled  5.  Hyperlipidemia -LDL goal < 70.  -I have personally reviewed and interpreted outside labs performed by patient's PCP which showed LDL 39, HDL 55 and triglycerides 82 and ALT 20 in Nov 2021 -Continue prescription drug management with Atorvastatin [id number]>>refill for 1 year  6.  HTN -BP controlled on exam today -he has not required antihypertensive therapy   Medication Adjustments/Labs and Tests Ordered: Current medicines are reviewed at length with the patient today.  Concerns regarding medicines are outlined above.  Tests Ordered: Orders Placed This Encounter  Procedures  . EKG 12-Lead   Medication  Changes: No orders of the defined types were placed in this encounter.   Disposition:  Follow up in 1 year(s)  Signed, [name], MD  08/27/2020 10:18 AM    [facility]

## 2020-08-27 NOTE — Patient Instructions (Signed)
Medication Instructions:  Your physician recommends that you continue on your current medications as directed. Please refer to the Current Medication list given to you today.  *If you need a refill on your cardiac medications before your next appointment, please call your pharmacy   Testing/Procedures: Your physician has requested that you have an echocardiogram in August. Echocardiography is a painless test that uses sound waves to create images of your heart. It provides your doctor with information about the size and shape of your heart and how well your heart's chambers and valves are working. This procedure takes approximately one hour. There are no restrictions for this procedure.  Follow-Up: At [facility], you and your health needs are our priority.  As part of our continuing mission to provide you with exceptional heart care, we have created designated Provider Care Teams.  These Care Teams include your primary Cardiologist (physician) and Advanced Practice Providers (APPs -  Physician Assistants and Nurse Practitioners) who all work together to provide you with the care you need, when you need it.    Your next appointment:   1 year(s)  The format for your next appointment:   In Person  Provider:   You may see [name], MD or one of the following Advanced Practice Providers on your designated Care Team:    Melina Copa, PA-C  Ermalinda Barrios, PA-C

## 2020-08-27 NOTE — Addendum Note (Signed)
Addended by: Antonieta Iba on: 08/27/2020 10:44 AM   Modules accepted: Orders

## 2020-08-31 ENCOUNTER — Other Ambulatory Visit: Payer: Self-pay

## 2020-08-31 DIAGNOSIS — J3089 Other allergic rhinitis: Secondary | ICD-10-CM

## 2020-08-31 MED ORDER — FLUTICASONE PROPIONATE 50 MCG/ACT NA SUSP: 2.0000 | Freq: Every day | NASAL | 3 refills | Status: DC | PRN

## 2020-09-13 ENCOUNTER — Inpatient Hospital Stay: Payer: Medicare PPO

## 2020-09-13 ENCOUNTER — Emergency Department: Payer: Medicare PPO

## 2020-09-13 ENCOUNTER — Emergency Department (HOSPITAL_COMMUNITY)
Admit: 2020-09-13 | Discharge: 2020-09-13 | Disposition: A | Discharge status: Home / Self Care | Payer: Medicare PPO | Attending: Medical | Admitting: Medical

## 2020-09-13 ENCOUNTER — Other Ambulatory Visit: Payer: Self-pay

## 2020-09-13 ENCOUNTER — Observation Stay
Admission: EM | Admit: 2020-09-13 | Discharge: 2020-09-14 | Disposition: A | Discharge status: Home / Self Care | Payer: Medicare PPO | Attending: Internal Medicine | Admitting: Internal Medicine

## 2020-09-13 DIAGNOSIS — Z85828 Personal history of other malignant neoplasm of skin: Secondary | ICD-10-CM | POA: Diagnosis not present

## 2020-09-13 DIAGNOSIS — Z7982 Long term (current) use of aspirin: Secondary | ICD-10-CM | POA: Diagnosis not present

## 2020-09-13 DIAGNOSIS — R55 Syncope and collapse: Secondary | ICD-10-CM

## 2020-09-13 DIAGNOSIS — I340 Nonrheumatic mitral (valve) insufficiency: Secondary | ICD-10-CM

## 2020-09-13 DIAGNOSIS — R001 Bradycardia, unspecified: Secondary | ICD-10-CM | POA: Insufficient documentation

## 2020-09-13 DIAGNOSIS — I10 Essential (primary) hypertension: Secondary | ICD-10-CM | POA: Diagnosis not present

## 2020-09-13 DIAGNOSIS — R42 Dizziness and giddiness: Secondary | ICD-10-CM | POA: Diagnosis not present

## 2020-09-13 DIAGNOSIS — Z953 Presence of xenogenic heart valve: Secondary | ICD-10-CM

## 2020-09-13 DIAGNOSIS — K8020 Calculus of gallbladder without cholecystitis without obstruction: Secondary | ICD-10-CM | POA: Diagnosis not present

## 2020-09-13 DIAGNOSIS — Z951 Presence of aortocoronary bypass graft: Secondary | ICD-10-CM | POA: Diagnosis not present

## 2020-09-13 DIAGNOSIS — I509 Heart failure, unspecified: Secondary | ICD-10-CM | POA: Diagnosis not present

## 2020-09-13 DIAGNOSIS — I712 Thoracic aortic aneurysm, without rupture, unspecified: Secondary | ICD-10-CM | POA: Diagnosis present

## 2020-09-13 DIAGNOSIS — I110 Hypertensive heart disease with heart failure: Secondary | ICD-10-CM | POA: Diagnosis not present

## 2020-09-13 DIAGNOSIS — D696 Thrombocytopenia, unspecified: Secondary | ICD-10-CM | POA: Diagnosis present

## 2020-09-13 DIAGNOSIS — E785 Hyperlipidemia, unspecified: Secondary | ICD-10-CM | POA: Diagnosis not present

## 2020-09-13 DIAGNOSIS — I2510 Atherosclerotic heart disease of native coronary artery without angina pectoris: Secondary | ICD-10-CM | POA: Diagnosis present

## 2020-09-13 DIAGNOSIS — Z87891 Personal history of nicotine dependence: Secondary | ICD-10-CM | POA: Diagnosis not present

## 2020-09-13 DIAGNOSIS — R0989 Other specified symptoms and signs involving the circulatory and respiratory systems: Secondary | ICD-10-CM

## 2020-09-13 DIAGNOSIS — Z20822 Contact with and (suspected) exposure to covid-19: Secondary | ICD-10-CM | POA: Insufficient documentation

## 2020-09-13 DIAGNOSIS — I350 Nonrheumatic aortic (valve) stenosis: Secondary | ICD-10-CM | POA: Diagnosis not present

## 2020-09-13 DIAGNOSIS — W19XXXA Unspecified fall, initial encounter: Secondary | ICD-10-CM | POA: Diagnosis not present

## 2020-09-13 DIAGNOSIS — I517 Cardiomegaly: Secondary | ICD-10-CM | POA: Diagnosis not present

## 2020-09-13 DIAGNOSIS — E7800 Pure hypercholesterolemia, unspecified: Secondary | ICD-10-CM | POA: Diagnosis present

## 2020-09-13 DIAGNOSIS — G4733 Obstructive sleep apnea (adult) (pediatric): Secondary | ICD-10-CM

## 2020-09-13 LAB — ECHOCARDIOGRAM COMPLETE
AR max vel: 1.75 cm2
AV Area VTI: 2.11 cm2
AV Area mean vel: 1.73 cm2
AV Mean grad: 18 mmHg
AV Peak grad: 30.3 mmHg
Ao pk vel: 2.75 m/s
Area-P 1/2: 2.51 cm2
Height: 69 in
MV VTI: 3.4 cm2
S' Lateral: 2.9 cm
Weight: 2889.6 oz

## 2020-09-13 LAB — COMPREHENSIVE METABOLIC PANEL
ALT: 23 U/L (ref 0–44)
AST: 32 U/L (ref 15–41)
Albumin: 4.3 g/dL (ref 3.5–5.0)
Alkaline Phosphatase: 68 U/L (ref 38–126)
Anion gap: 8 (ref 5–15)
BUN: 13 mg/dL (ref 8–23)
CO2: 21 mmol/L — ABNORMAL LOW (ref 22–32)
Calcium: 9.1 mg/dL (ref 8.9–10.3)
Chloride: 108 mmol/L (ref 98–111)
Creatinine, Ser: 0.68 mg/dL (ref 0.61–1.24)
GFR, Estimated: >60 mL/min (ref >60)
Glucose, Bld: 96 mg/dL (ref 70–99)
Potassium: 4.2 mmol/L (ref 3.5–5.1)
Sodium: 137 mmol/L (ref 135–145)
Total Bilirubin: 1.6 mg/dL — ABNORMAL HIGH (ref 0.3–1.2)
Total Protein: 6.5 g/dL (ref 6.5–8.1)

## 2020-09-13 LAB — CBC WITH DIFFERENTIAL/PLATELET
Abs Immature Granulocytes: 0.01 10*3/uL (ref 0.00–0.07)
Basophils Absolute: 0.1 10*3/uL (ref 0.0–0.1)
Basophils Relative: 1 %
Eosinophils Absolute: 0.1 10*3/uL (ref 0.0–0.5)
Eosinophils Relative: 2 %
HCT: 43.7 % (ref 39.0–52.0)
Hemoglobin: 15.3 g/dL (ref 13.0–17.0)
Immature Granulocytes: 0 %
Lymphocytes Relative: 15 %
Lymphs Abs: 0.6 10*3/uL — ABNORMAL LOW (ref 0.7–4.0)
MCH: 31.7 pg (ref 26.0–34.0)
MCHC: 35 g/dL (ref 30.0–36.0)
MCV: 90.5 fL (ref 80.0–100.0)
Monocytes Absolute: 0.3 10*3/uL (ref 0.1–1.0)
Monocytes Relative: 9 %
Neutro Abs: 2.9 10*3/uL (ref 1.7–7.7)
Neutrophils Relative %: 73 %
Platelets: 99 10*3/uL — ABNORMAL LOW (ref 150–400)
RBC: 4.83 MIL/uL (ref 4.22–5.81)
RDW: 12.9 % (ref 11.5–15.5)
Smear Review: DECREASED
WBC: 4 10*3/uL (ref 4.0–10.5)
nRBC: 0 % (ref 0.0–0.2)

## 2020-09-13 LAB — TROPONIN I (HIGH SENSITIVITY)
Troponin I (High Sensitivity): 10 ng/L (ref <18)
Troponin I (High Sensitivity): 10 ng/L (ref <18)

## 2020-09-13 LAB — TSH: TSH: 1.002 u[IU]/mL (ref 0.350–4.500)

## 2020-09-13 LAB — RESP PANEL BY RT-PCR (FLU A&B, COVID) ARPGX2
Influenza A by PCR: NEGATIVE
Influenza B by PCR: NEGATIVE
SARS Coronavirus 2 by RT PCR: NEGATIVE

## 2020-09-13 LAB — MAGNESIUM: Magnesium: 2.1 mg/dL (ref 1.7–2.4)

## 2020-09-13 LAB — BRAIN NATRIURETIC PEPTIDE: B Natriuretic Peptide: 31.8 pg/mL (ref 0.0–100.0)

## 2020-09-13 MED ORDER — ONDANSETRON HCL 4 MG PO TABS: 4.0000 mg | ORAL_TABLET | Freq: Four times a day (QID) | ORAL | Status: DC | PRN

## 2020-09-13 MED ORDER — IOHEXOL 350 MG/ML SOLN
75.0000 mL | Freq: Once | INTRAVENOUS | Status: AC | PRN
  Administered 2020-09-13: 75 mL via INTRAVENOUS

## 2020-09-13 MED ORDER — ASPIRIN EC 81 MG PO TBEC: 81.0000 mg | DELAYED_RELEASE_TABLET | Freq: Every day | ORAL | Status: DC

## 2020-09-13 MED ORDER — ACETAMINOPHEN 325 MG PO TABS: 650.0000 mg | ORAL_TABLET | Freq: Four times a day (QID) | ORAL | Status: DC | PRN

## 2020-09-13 MED ORDER — HYDRALAZINE HCL 20 MG/ML IJ SOLN: 5.0000 mg | Freq: Four times a day (QID) | INTRAMUSCULAR | Status: DC | PRN

## 2020-09-13 MED ORDER — ATORVASTATIN CALCIUM 80 MG PO TABS
80.0000 mg | ORAL_TABLET | Freq: Every day | ORAL | Status: DC
  Administered 2020-09-13: 80 mg via ORAL
  Filled 2020-09-13: qty 4

## 2020-09-13 MED ORDER — ACETAMINOPHEN 650 MG RE SUPP: 650.0000 mg | Freq: Four times a day (QID) | RECTAL | Status: DC | PRN

## 2020-09-13 MED ORDER — ONDANSETRON HCL 4 MG/2ML IJ SOLN: 4.0000 mg | Freq: Four times a day (QID) | INTRAMUSCULAR | Status: DC | PRN

## 2020-09-13 NOTE — ED Provider Notes (Signed)
[facility] Emergency Department Provider Note  ____________________________________________   Event Date/Time   First MD Initiated Contact with Patient 09/13/20 [phone]     (approximate)  I have reviewed the triage vital signs and the nursing notes.   HISTORY  Chief Complaint Dizziness   HPI Todd Frank is a 65 y.o. male with Paschal history of OSA, aortic stenosis s/p bioprosthetic AVR in 2019, CAD s/p CABG, CHF, PFO, HTN, HDL and chronic back pain who presents for assessment of some lightheadedness and associated syncopal episode that occurred earlier this morning.  Patient states she was in usual state of health when he got up this morning and was about to start working out and doing some stretching when he started feel very lightheaded and felt like he was breaking out in a sweat.  He states he was sitting down and thinks he passed out because he woke up slumped over.  States he lowered self to the ground but was still feeling lightheaded when to get checked out.  States he feels better now but still not completely normal.  He denies any associated chest pain, cough, shortness of breath, fevers, vomiting, diarrhea, dysuria, rash, abdominal pain, back pain, extremity pain, focal weakness numbness or tingling, vertigo, vision changes, or any other associated sick symptoms.  No clear alleviating aggravating factors.  He is not on any beta-blockers or calcium channel blockers.         Past Medical History:  Diagnosis Date   Aortic stenosis    a. syncope/severe AS s/p bioprosthetic AVR 06/2017.   Arthritis    Ascending aorta dilatation (HCC)    57mm by echo 11/2019   Bicuspid aortic valve    Bradycardia    secondary to BB   Broken clavicle    as child, can still dislocate at times   CAD (coronary artery disease), native coronary artery    a. s/p 2V CABG at time of AVR 06/2017.   Carotid bruit    carotid dopplers negative.  Bruit due to heart murmur from  AS   Congenital nevus    Diastolic dysfunction    Dysplastic nevus 12/12/2008   Left mid back, 9cm lat. to spine. Moderate atypia, extends to one edge.    Dysplastic nevus 12/12/2008   Left mid to low back, 7cm lat. to spine. Moderate atypia, close to margin.    Dysplastic nevus 09/24/2012   Right mid lateral buttocks. Moderate atypia, close to edge.    Dysplastic nevus 08/15/2020   Left mid back infrascapular - needs excision   GERD (gastroesophageal reflux disease)    Headache    optical migraine   Hx of melanoma in situ 06/26/2009   Left superior thigh. Arising in dysplastic nevus, close to margin. Excised 07/11/2009, margins free.   Hyperlipidemia    LDL goal < 70   LVH (left ventricular hypertrophy)    Melanoma of skin (HCC)    Obesity    PFO (patent foramen ovale)    small by echo 2014, but not demonstrated during intra-op note from CABG/AVR 06/2017.    Patient Active Problem List   Diagnosis Date Noted   GAD (generalized anxiety disorder) 02/17/2020   Bradycardia, drug induced 04/27/2018   OSA on CPAP 02/12/2018   Vitamin D deficiency 12/18/2017   Thrombocytopenia ([place]) 11/13/2017   Chronic frontal sinusitis 09/08/2017   S/P aortic valve replacement with bioprosthetic valve 06/25/2017   S/P CABG x 2 06/25/2017   Syncope 05/31/2017  CAD (coronary artery disease), native coronary artery    Carotid bruit    Abnormal nuclear stress test 02/20/2016   Chronic low back pain with right-sided sciatica 02/04/2016   Overweight (BMI 25.0-29.9) 02/04/2016   Prostate cancer screening 02/04/2016   Exposure to hepatitis C 02/04/2016   History of melanoma excision 01/09/2016   Colon cancer screening 01/09/2016   Bradycardia 07/23/2015   Allergic rhinitis 05/14/2015   ED (erectile dysfunction) 02/02/2015   Hamstring tightness 12/02/2013   Fasciculations of muscle 12/02/2013   Piriformis syndrome of right side 11/01/2013   Iliotibial band syndrome 10/11/2013   Severe aortic  stenosis 01/26/2013   Essential hypertension 01/26/2013   Hypercholesteremia    LVH (left ventricular hypertrophy)    PFO (patent foramen ovale)    Diastolic dysfunction    GERD (gastroesophageal reflux disease)    Dilated aortic root (HCC)     Past Surgical History:  Procedure Laterality Date   AORTIC VALVE REPLACEMENT N/A 06/25/2017   Procedure: AORTIC VALVE REPLACEMENT (AVR) using Magna Ease Aortic Valve size 41mm;  Surgeon: Ivin Poot, MD;  Location: [place];  Service: Open Heart Surgery;  Laterality: N/A;   arthritic cyst removal  2017   from Lumbar 4-5, dr pool   CARDIAC CATHETERIZATION N/A 02/20/2016   Procedure: Right/Left Heart Cath and Coronary Angiography;  Surgeon: Belva Crome, MD;  Location: [place] [facility];  Service: Cardiovascular;  Laterality: N/A;   CORONARY ARTERY BYPASS GRAFT N/A 06/25/2017   Procedure: CORONARY ARTERY BYPASS GRAFTING (CABG) x Two , using left internal mammary artery and right leg greater saphenous vein harvested endoscopically;  Surgeon: Ivin Poot, MD;  Location: [place];  Service: Open Heart Surgery;  Laterality: N/A;   CORONARY/GRAFT ANGIOGRAPHY N/A 06/02/2017   Procedure: CORONARY/GRAFT ANGIOGRAPHY;  Surgeon: Nelva Bush, MD;  Location: [place] [facility];  Service: Cardiovascular;  Laterality: N/A;   EYE SURGERY     MELANOMA EXCISION  2011   left thigh   RIGHT HEART CATH N/A 06/02/2017   Procedure: RIGHT HEART CATH;  Surgeon: Nelva Bush, MD;  Location: [place] [facility];  Service: Cardiovascular;  Laterality: N/A;   TEE WITHOUT CARDIOVERSION N/A 06/25/2017   Procedure: TRANSESOPHAGEAL ECHOCARDIOGRAM (TEE);  Surgeon: Prescott Gum, Collier Salina, MD;  Location: [place];  Service: Open Heart Surgery;  Laterality: N/A;   TONSILLECTOMY      Prior to Admission medications   Medication Sig Start Date End Date Taking? Authorizing Provider  aspirin EC 81 MG tablet Take 81 mg by mouth daily.    [provider]  atorvastatin  (LIPITOR) 80 MG tablet Take 1 tablet (80 mg total) by mouth daily. 08/27/20   [name], MD  azelastine (ASTELIN) 0.1 % nasal spray Place 1 spray into both nostrils 2 (two) times daily. Use in each nostril as directed 02/15/19   Olin Hauser, DO  cholecalciferol (VITAMIN D) 1000 units tablet Take 1,000 Units by mouth 2 (two) times daily.    [provider]  Coenzyme Q10 (COQ-10) 200 MG CAPS Take 200 mg by mouth daily.    [provider]  fluticasone (FLONASE) 50 MCG/ACT nasal spray Place 2 sprays into both nostrils daily as needed (for allergies.). 08/31/20   Olin Hauser, DO  vitamin B-12 (CYANOCOBALAMIN) 1000 MCG tablet Take 1,000 mcg by mouth daily.    [provider]    Allergies Patient has no known allergies.  Family History  Problem Relation Age of Onset  Diabetes Mother    Heart disease Mother    Arthritis Father    Arthritis Sister    Heart disease Paternal Grandmother        MI   Heart attack Other        family history   Pancreatic cancer Maternal Aunt     Social History Social History   Tobacco Use   Smoking status: Former    Packs/day: 1.00    Pack years: 0.00    Types: Cigarettes    Quit date: 04/07/1976    Years since quitting: 44.4   Smokeless tobacco: Former   Tobacco comments:    quit in 78, teenager  Vaping Use   Vaping Use: Never used  Substance Use Topics   Alcohol use: Yes    Alcohol/week: 1.0 standard drink    Types: 1 Cans of beer per week    Comment: occasional   Drug use: No    Comment: quit marijuana in 1990    Review of Systems  Review of Systems  Constitutional:  Negative for chills and fever.  HENT:  Negative for sore throat.   Eyes:  Negative for pain.  Respiratory:  Negative for cough and stridor.   Cardiovascular:  Negative for chest pain.  Gastrointestinal:  Negative for vomiting.  Skin:  Negative for rash.  Neurological:  Positive for dizziness. Negative for seizures,  loss of consciousness and headaches.  Psychiatric/Behavioral:  Negative for suicidal ideas.   All other systems reviewed and are negative.    ____________________________________________   PHYSICAL EXAM:  VITAL SIGNS: ED Triage Vitals  Enc Vitals Group     BP 09/13/20 0752 (!) 152/92     Pulse Rate 09/13/20 0752 (!) 51     Resp 09/13/20 0752 19     Temp 09/13/20 0752 98.2 F (36.8 C)     Temp Source 09/13/20 0752 Oral     SpO2 09/13/20 0752 98 %     Weight 09/13/20 0753 180 lb 9.6 oz (81.9 kg)     Height 09/13/20 0753 5\' 9"  (1.753 m)     Head Circumference --      Peak Flow --      Pain Score 09/13/20 0753 0     Pain Loc --      Pain Edu? --      Excl. in [place]? --    Vitals:   09/13/20 1100 09/13/20 1200  BP: (!) 139/91 128/81  Pulse: (!) 55 (!) 57  Resp: 12 10  Temp:    SpO2: 98% 99%   Physical Exam Vitals and nursing note reviewed.  Constitutional:      Appearance: He is well-developed.  HENT:     Head: Normocephalic and atraumatic.     Right Ear: External ear normal.     Left Ear: External ear normal.     Nose: Nose normal.  Eyes:     Conjunctiva/sclera: Conjunctivae normal.  Cardiovascular:     Rate and Rhythm: Regular rhythm. Bradycardia present.     Heart sounds: No murmur heard. Pulmonary:     Effort: Pulmonary effort is normal. No respiratory distress.     Breath sounds: Normal breath sounds.  Abdominal:     Palpations: Abdomen is soft.     Tenderness: There is no abdominal tenderness.  Musculoskeletal:     Cervical back: Neck supple.  Skin:    General: Skin is warm and dry.     Capillary Refill: Capillary refill takes less than 2  seconds.  Neurological:     General: No focal deficit present.     Mental Status: He is alert and oriented to person, place, and time.     Cranial Nerves: No cranial nerve deficit.  Psychiatric:        Mood and Affect: Mood normal.     ____________________________________________   LABS (all labs ordered are  listed, but only abnormal results are displayed)  Labs Reviewed  CBC WITH DIFFERENTIAL/PLATELET - Abnormal; Notable for the following components:      Result Value   Platelets 99 (*)    Lymphs Abs 0.6 (*)    All other components within normal limits  COMPREHENSIVE METABOLIC PANEL - Abnormal; Notable for the following components:   CO2 21 (*)    Total Bilirubin 1.6 (*)    All other components within normal limits  RESP PANEL BY RT-PCR (FLU A&B, COVID) ARPGX2  MAGNESIUM  BRAIN NATRIURETIC PEPTIDE  TSH  TROPONIN I (HIGH SENSITIVITY)  TROPONIN I (HIGH SENSITIVITY)   ____________________________________________  EKG  Sinus rhythm with a ventricular of 63, normal axis, unremarkable intervals with nonspecific change and Q-wave in lead III as well as nonspecific T wave changes in V5 and V6 without other clearance of acute ischemia. ____________________________________________  RADIOLOGY  ED MD interpretation: No focal consolidation, overt edema, pneumothorax or other clear acute intrathoracic process.  Official radiology report(s): DG Chest 2 View  Result Date: 09/13/2020 CLINICAL DATA:  Syncope EXAM: CHEST - 2 VIEW COMPARISON:  11/17/2017. FINDINGS: Cardiomediastinal silhouette is within normal limits and similar to prior. Median sternotomy. Linear left basilar opacities appear similar to prior and likely represent. No visible pleural effusions or pneumothorax. Degenerative change of the thoracic spine with flowing anterior osteophytes, suggestive of diffuse idiopathic skeletal hyperostosis. Osteopenia. IMPRESSION: Similar left basilar scar without evidence of acute cardiopulmonary disease. Electronically Signed   By: Margaretha Sheffield MD   On: 09/13/2020 08:54   ECHOCARDIOGRAM COMPLETE  Result Date: 09/13/2020    ECHOCARDIOGRAM REPORT   Patient Name:   JAE SKEET Date of Exam: 09/13/2020 Medical Rec #:  [id number]      Height:       69.0 in Accession #:    [id number]     Weight:        180.6 lb Date of Birth:  [birth year]      BSA:          1.978 m Patient Age:    48 years       BP:           139/91 mmHg Patient Gender: M              HR:           52 bpm. Exam Location:  [facility] Procedure: 2D Echo, Color Doppler, Cardiac Doppler and Strain Analysis STAT ECHO Indications:     R55 Syncope  History:         Patient has prior history of Echocardiogram examinations, most                  recent 11/09/2019. CAD, Aortic Valve Disease; Risk Factors:Former                  Smoker, Hypertension and Dyslipidemia.  Sonographer:     Charmayne Sheer RDCS (AE) Referring Phys:  [id number] [place] Diagnosing Phys: Kathlyn Sacramento MD  Sonographer Comments: Global longitudinal strain was attempted. IMPRESSIONS  1. Left ventricular ejection fraction, by estimation, is 60  to 65%. The left ventricle has normal function. The left ventricle has no regional wall motion abnormalities. There is mild left ventricular hypertrophy. Left ventricular diastolic parameters are consistent with Grade I diastolic dysfunction (impaired relaxation).  2. Right ventricular systolic function is normal. The right ventricular size is normal. Tricuspid regurgitation signal is inadequate for assessing PA pressure.  3. Left atrial size was mildly dilated.  4. Right atrial size was mildly dilated.  5. The mitral valve is normal in structure. Mild mitral valve regurgitation. No evidence of mitral stenosis.  6. The aortic valve has been repaired/replaced. Aortic valve regurgitation is not visualized. No aortic stenosis is present. Echo findings are consistent with normal structure and function of the aortic valve prosthesis. Aortic valve area, by VTI measures 2.11 cm. Aortic valve mean gradient measures 18.0 mmHg.  7. Aortic dilatation noted. There is moderate dilatation of the ascending aorta, measuring 47 mm. Only the proximal portion was visualized. FINDINGS  Left Ventricle: Left ventricular ejection fraction, by estimation, is 60 to 65%. The left  ventricle has normal function. The left ventricle has no regional wall motion abnormalities. The left ventricular internal cavity size was normal in size. There is  mild left ventricular hypertrophy. Left ventricular diastolic parameters are consistent with Grade I diastolic dysfunction (impaired relaxation). Right Ventricle: The right ventricular size is normal. No increase in right ventricular wall thickness. Right ventricular systolic function is normal. Tricuspid regurgitation signal is inadequate for assessing PA pressure. Left Atrium: Left atrial size was mildly dilated. Right Atrium: Right atrial size was mildly dilated. Pericardium: There is no evidence of pericardial effusion. Mitral Valve: The mitral valve is normal in structure. Mild mitral valve regurgitation. No evidence of mitral valve stenosis. MV peak gradient, 4.5 mmHg. The mean mitral valve gradient is 1.0 mmHg. Tricuspid Valve: The tricuspid valve is normal in structure. Tricuspid valve regurgitation is trivial. No evidence of tricuspid stenosis. Aortic Valve: The aortic valve has been repaired/replaced. Aortic valve regurgitation is not visualized. No aortic stenosis is present. Aortic valve mean gradient measures 18.0 mmHg. Aortic valve peak gradient measures 30.2 mmHg. Aortic valve area, by VTI measures 2.11 cm. There is a bovine valve present in the aortic position. Echo findings are consistent with normal structure and function of the aortic valve prosthesis. Pulmonic Valve: The pulmonic valve was normal in structure. Pulmonic valve regurgitation is not visualized. No evidence of pulmonic stenosis. Aorta: Aortic dilatation noted. There is moderate dilatation of the ascending aorta, measuring 47 mm. Venous: The inferior vena cava was not well visualized. IAS/Shunts: No atrial level shunt detected by color flow Doppler.  LEFT VENTRICLE PLAX 2D LVIDd:         4.70 cm  Diastology LVIDs:         2.90 cm  LV e' medial:    4.24 cm/s LV PW:          1.60 cm  LV E/e' medial:  18.4 LV IVS:        1.10 cm  LV e' lateral:   9.46 cm/s LVOT diam:     1.90 cm  LV E/e' lateral: 8.2 LV SV:         125 LV SV Index:   63       2D Longitudinal Strain LVOT Area:     2.84 cm 2D Strain GLS Avg:     -16.3 %  RIGHT VENTRICLE RV Basal diam:  4.10 cm LEFT ATRIUM  Index       RIGHT ATRIUM           Index LA diam:        4.40 cm 2.22 cm/m  RA Area:     20.20 cm LA Vol (A2C):   31.3 ml 15.82 ml/m RA Volume:   57.30 ml  28.96 ml/m LA Vol (A4C):   54.5 ml 27.55 ml/m LA Biplane Vol: 45.6 ml 23.05 ml/m  AORTIC VALVE                    PULMONIC VALVE AV Area (Vmax):    1.75 cm     PV Vmax:       1.15 m/s AV Area (Vmean):   1.73 cm     PV Vmean:      84.000 cm/s AV Area (VTI):     2.11 cm     PV VTI:        0.182 m AV Vmax:           275.00 cm/s  PV Peak grad:  5.3 mmHg AV Vmean:          203.000 cm/s PV Mean grad:  3.0 mmHg AV VTI:            0.593 m AV Peak Grad:      30.2 mmHg AV Mean Grad:      18.0 mmHg LVOT Vmax:         170.00 cm/s LVOT Vmean:        124.000 cm/s LVOT VTI:          0.442 m LVOT/AV VTI ratio: 0.75  AORTA Ao Root diam: 2.50 cm MITRAL VALVE MV Area (PHT): 2.51 cm    SHUNTS MV Area VTI:   3.40 cm    Systemic VTI:  0.44 m MV Peak grad:  4.5 mmHg    Systemic Diam: 1.90 cm MV Mean grad:  1.0 mmHg MV Vmax:       1.06 m/s MV Vmean:      55.9 cm/s MV Decel Time: 302 msec MV E velocity: 78.00 cm/s MV A velocity: 96.40 cm/s MV E/A ratio:  0.81 Kathlyn Sacramento MD Electronically signed by Kathlyn Sacramento MD Signature Date/Time: 09/13/2020/12:05:04 PM    Final     ____________________________________________   PROCEDURES  Procedure(s) performed (including Critical Care):  .1-3 Lead EKG Interpretation  Date/Time: 09/13/2020 9:09 AM Performed by: Lucrezia Starch, MD Authorized by: Lucrezia Starch, MD     ECG rate assessment: bradycardic     Rhythm: sinus rhythm     Ectopy: none     Conduction: normal      ____________________________________________   INITIAL IMPRESSION / ASSESSMENT AND PLAN / ED COURSE      Patient presents with above-stated history exam for assessment of presyncopal episode preceded by some lightheadedness.  Patient states he thinks his heart rate was in the 40s were before he passed out.  States he feels little better now but not complete back to normal.  He denies any associated symptoms.  On arrival he is hypertensive to be 152/92 and noted to have a heart rate of 51 with otherwise stable vital signs on room air.  Patient states his heart rate is typically 55-60 and his blood pressure is systolic is typically in the 1 teens.  Differential includes related to bradycardia versus other arrhythmia, ACS anemia, metabolic derangements, PE, possible orthostatic versus vasovagal.  No focal deficits to suggest CVA.  No recent  history of trauma.  Low suspicion for toxic ingestion.  Patient is not on any nodal blockers at this time.  Overall low suspicion for ACS given patient has no hypoxia tachypnea and denies any chest pain or shortness of breath and there is no asymmetric lower extremity edema or history of hemoptysis.  ECG shows sinus rhythm with a rate of 63 with nonspecific T waves and Q's in inferior leads III and nonspecific T wave change in lead II as well as V5 and V6 without other clearance of acute ischemia.  Unremarkable intervals.  Patient is not orthostatic.  CBC shows no acute anemia or leukocytosis.  Platelets at baseline consistent with chronic thrombocytopenia.  CMP without any significant electrolyte or metabolic derangements.  Troponins x2 are nonelevated and overall I have a low suspicion for ACS at this time.  BMP magnesium unremarkable.  Exam chest x-ray and BNP not consistent with acute heart failure.  I am concerned for symptomatic bradycardia versus other etiologies for cardiac syncope.  I did consult cardiology came and saw the patient and obtain echo.   They did recommend obtaining a CTA chest and admission for observation.  CTA chest ordered.  I will admit to medicine service for further evaluation and management.       ____________________________________________   FINAL CLINICAL IMPRESSION(S) / ED DIAGNOSES  Final diagnoses:  Syncope, unspecified syncope type  Bradycardia  S/P CABG (coronary artery bypass graft)    Medications  iohexol (OMNIPAQUE) 350 MG/ML injection 75 mL (75 mLs Intravenous Contrast Given 09/13/20 1217)     ED Discharge Orders     None        Note:  This document was prepared using Dragon voice recognition software and may include unintentional dictation errors.    Lucrezia Starch, MD 09/13/20 1228

## 2020-09-13 NOTE — ED Notes (Signed)
Pt resting comfortably at this time. Pt denies any needs at this time. Call bell in reach. VSS.

## 2020-09-13 NOTE — ED Notes (Signed)
Patient provided with phone for MRI screening.

## 2020-09-13 NOTE — Consult Note (Addendum)
Cardiology Consultation:   Patient ID: Todd Frank MRN: [id number]; DOB: 11/18/1955  Admit date: 09/13/2020 Date of Consult: 09/13/2020  PCP:  Todd Hauser, DO   [place] [facility] Cardiologist:  Todd Him, MD   {   Patient Profile:   Todd Frank is a 65 y.o. male with a hx of HTN, dyslipidemia, diastolic dysfunction, dilated aorta, OSA on CPAP, prior small PFO, CAD status post CABGx2 with AVR (AVR Magna Ease Aortic Valve- not on lifelong a/c) in 2019 who is being seen 09/13/2020 for the evaluation of syncope at the request of Todd Frank.  History of Present Illness:   Todd Frank is followed by Todd Frank for the above cardiac issues.  In 06/2017 patient presented with a syncopal episode jogging.  Echo showed progression of AS.  Cardiac cath showed severe two-vessel CAD and progression to severe AS. He subsequently underwent CABG x2 with LIMA to LAD, SVG to distal PDA, and valve replacement with biological valve (AVR Magna Ease Aortic Valve) not on lifelong a/c.  Postop echo showed LVEF 50 to 55% no pericardial effusion no wall motion abnormality and stable AVR.  Due to excessive daytime sleepiness he was noted to have mild obstructive sleep apnea and is on CPAP.  Patient seen 08/2019 and complained of chest pain.  Lexiscan was ordered which showed no ischemia.  He was last seen 08/27/2020 in the office and was overall doing well from a cardiac standpoint. Echo was ordered to re-evaluate valve.   The patient presented to [facility] ED 09/2020 with syncope. Patient has a normal routine every day. Today he woke up, drank water, and did his normal stretching. He got up and went into another room and sat down to take his BP. At this point he started feeling different, a little lightheaded. BP came back higher than normal and heart rate in the 50s, as normal. Patient then had a syncopal episode, he suspects he was out for a couple seconds before coming to. He remember he felt swimmy  headed and lightheaded. He didn't feel dizzy, have palpitations, chest pain, numbness/tingling, weakness, or SOB prior to episode. He just slumped down in the chair, no injuries. When he came to he fel very sweaty HE lay flat. Checked his BP again and it was still high, heart rate was noted to be in the 40s. O2 was normal. He said he was sweaty with symptoms for about 10 minutes. Says it feels somewhat similar to prior syncopal episode but situation very different  Of note he is not on antihypertensive medications or AV nodal blocking agents. He takes Aspirin and a statin. Very active and exercises every morning. Eating and drinking normally. Reports compliance with CPAP. No recent fever, chills, nausea, vomiting, abd pain, LLE, orthopnea, pnd.   In the ER BP 152/92, HR 51, RR 19, afebrile, 98% O2. Labs showed potassium 4.2, sodium 137, creatinine 0.68, BUN 13, Mag 2.1, LFTs wnl, BNP wnl, WBC 4.0, Hgb 15.3. HS trop 10. CXR without acute disease. EKG pending. Tele showed SB with heart rates upper 40s and 50s, no AV block noted. Stat echo ordered.   Past Medical History:  Diagnosis Date   Aortic stenosis    a. syncope/severe AS s/p bioprosthetic AVR 06/2017.   Arthritis    Ascending aorta dilatation (HCC)    76mm by echo 11/2019   Bicuspid aortic valve    Bradycardia    secondary to BB   Broken clavicle    as child,  can still dislocate at times   CAD (coronary artery disease), native coronary artery    a. s/p 2V CABG at time of AVR 06/2017.   Carotid bruit    carotid dopplers negative.  Bruit due to heart murmur from AS   Congenital nevus    Diastolic dysfunction    Dysplastic nevus 12/12/2008   Left mid back, 9cm lat. to spine. Moderate atypia, extends to one edge.    Dysplastic nevus 12/12/2008   Left mid to low back, 7cm lat. to spine. Moderate atypia, close to margin.    Dysplastic nevus 09/24/2012   Right mid lateral buttocks. Moderate atypia, close to edge.    Dysplastic nevus  08/15/2020   Left mid back infrascapular - needs excision   GERD (gastroesophageal reflux disease)    Headache    optical migraine   Hx of melanoma in situ 06/26/2009   Left superior thigh. Arising in dysplastic nevus, close to margin. Excised 07/11/2009, margins free.   Hyperlipidemia    LDL goal < 70   LVH (left ventricular hypertrophy)    Melanoma of skin (HCC)    Obesity    PFO (patent foramen ovale)    small by echo 2014, but not demonstrated during intra-op note from CABG/AVR 06/2017.    Past Surgical History:  Procedure Laterality Date   AORTIC VALVE REPLACEMENT N/A 06/25/2017   Procedure: AORTIC VALVE REPLACEMENT (AVR) using Magna Ease Aortic Valve size 66mm;  Surgeon: Ivin Poot, MD;  Location: [place];  Service: Open Heart Surgery;  Laterality: N/A;   arthritic cyst removal  2017   from Lumbar 4-5, dr pool   CARDIAC CATHETERIZATION N/A 02/20/2016   Procedure: Right/Left Heart Cath and Coronary Angiography;  Surgeon: Belva Crome, MD;  Location: [place] [facility];  Service: Cardiovascular;  Laterality: N/A;   CORONARY ARTERY BYPASS GRAFT N/A 06/25/2017   Procedure: CORONARY ARTERY BYPASS GRAFTING (CABG) x Two , using left internal mammary artery and right leg greater saphenous vein harvested endoscopically;  Surgeon: Ivin Poot, MD;  Location: [place];  Service: Open Heart Surgery;  Laterality: N/A;   CORONARY/GRAFT ANGIOGRAPHY N/A 06/02/2017   Procedure: CORONARY/GRAFT ANGIOGRAPHY;  Surgeon: Nelva Bush, MD;  Location: [place] [facility];  Service: Cardiovascular;  Laterality: N/A;   EYE SURGERY     MELANOMA EXCISION  2011   left thigh   RIGHT HEART CATH N/A 06/02/2017   Procedure: RIGHT HEART CATH;  Surgeon: Nelva Bush, MD;  Location: [place] [facility];  Service: Cardiovascular;  Laterality: N/A;   TEE WITHOUT CARDIOVERSION N/A 06/25/2017   Procedure: TRANSESOPHAGEAL ECHOCARDIOGRAM (TEE);  Surgeon: Prescott Gum, Collier Salina, MD;  Location: [place];  Service: Open  Heart Surgery;  Laterality: N/A;   TONSILLECTOMY       Home Medications:  Prior to Admission medications   Medication Sig Start Date End Date Taking? Authorizing Provider  aspirin EC 81 MG tablet Take 81 mg by mouth daily.    [provider]  atorvastatin (LIPITOR) 80 MG tablet Take 1 tablet (80 mg total) by mouth daily. 08/27/20   [name], MD  azelastine (ASTELIN) 0.1 % nasal spray Place 1 spray into both nostrils 2 (two) times daily. Use in each nostril as directed 02/15/19   Todd Hauser, DO  cholecalciferol (VITAMIN D) 1000 units tablet Take 1,000 Units by mouth 2 (two) times daily.    [provider]  Coenzyme Q10 (COQ-10) 200 MG CAPS Take 200 mg by  mouth daily.    [provider]  fluticasone (FLONASE) 50 MCG/ACT nasal spray Place 2 sprays into both nostrils daily as needed (for allergies.). 08/31/20   Todd Hauser, DO  vitamin B-12 (CYANOCOBALAMIN) 1000 MCG tablet Take 1,000 mcg by mouth daily.    [provider]    Inpatient Medications: Scheduled Meds:  Continuous Infusions:  PRN Meds:   Allergies:   No Known Allergies  Social History:   Social History   Socioeconomic History   Marital status: Married    Spouse name: [name]   Number of children: 0   Years of education: Assoc degree   Highest education level: Not on file  Occupational History   Occupation: Government social research officer - [employer]  Tobacco Use   Smoking status: Former    Packs/day: 1.00    Pack years: 0.00    Types: Cigarettes    Quit date: 04/07/1976    Years since quitting: 44.4   Smokeless tobacco: Former   Tobacco comments:    quit in 78, teenager  Vaping Use   Vaping Use: Never used  Substance and Sexual Activity   Alcohol use: Yes    Alcohol/week: 1.0 standard drink    Types: 1 Cans of beer per week    Comment: occasional   Drug use: No    Comment: quit marijuana in 1990   Sexual activity: Yes  Other Topics Concern   Not on file   Social History Narrative   Lives with wife   Caffeine < 1 cup daily   Social Determinants of Health   Financial Resource Strain: Not on file  Food Insecurity: Not on file  Transportation Needs: Not on file  Physical Activity: Not on file  Stress: Not on file  Social Connections: Not on file  Intimate Partner Violence: Not on file    Family History:    Family History  Problem Relation Age of Onset   Diabetes Mother    Heart disease Mother    Arthritis Father    Arthritis Sister    Heart disease Paternal Grandmother        MI   Heart attack Other        family history   Pancreatic cancer Maternal Aunt      ROS:  Please see the history of present illness.   All other ROS reviewed and negative.     Physical Exam/Data:   Vitals:   09/13/20 0752 09/13/20 0753 09/13/20 0800  BP: (!) 152/92  (!) 137/92  Pulse: (!) 51  (!) 56  Resp: 19  13  Temp: 98.2 F (36.8 C)    TempSrc: Oral    SpO2: 98%  99%  Weight:  81.9 kg   Height:  5\' 9"  (1.753 m)    No intake or output data in the 24 hours ending 09/13/20 1011 Last 3 Weights 09/13/2020 08/27/2020 06/04/2020  Weight (lbs) 180 lb 9.6 oz 179 lb 12.8 oz 193 lb 11.2 oz  Weight (kg) 81.92 kg 81.557 kg 87.862 kg     Body mass index is 26.67 kg/m.  General:  Well nourished, well developed, in no acute distress HEENT: normal Lymph: no adenopathy Neck: no JVD Endocrine:  No thryomegaly Vascular: No carotid bruits; FA pulses 2+ bilaterally without bruits  Cardiac:  normal S1, S2; RRR; + murmur  Lungs:  clear to auscultation bilaterally, no wheezing, rhonchi or rales  Abd: soft, nontender, no hepatomegaly  Ext: no edema Musculoskeletal:  No deformities, BUE and BLE  strength normal and equal Skin: warm and dry  Neuro:  CNs 2-12 intact, no focal abnormalities noted Psych:  Normal affect   EKG:  The EKG was personally reviewed and demonstrates:  pending Prior EKG shows SR 61bpm with old TWI inf/lat leads Telemetry:  Telemetry  was personally reviewed and demonstrates:  SB, HR upper 40s-50s, no high grade AV block appreaciated  Relevant CV Studies:  Echo 08/2020 1. Left ventricular ejection fraction, by estimation, is 55 to 60%. The  left ventricle has normal function. The left ventricle has no regional  wall motion abnormalities. There is mild left ventricular hypertrophy.   2. Right ventricular systolic function is normal. The right ventricular  size is normal.   3. The mitral valve is normal in structure. Trivial mitral valve  regurgitation.   4. The aortic valve has been repaired/replaced. Aortic valve  regurgitation is not visualized. There is a 23 mm Edwards pericardial  valve present in the aortic position. Echo findings are consistent with  normal structure and function of the aortic valve  prosthesis.   5. Aortic dilatation noted. There is mild dilatation of the ascending  aorta measuring 42 mm.   Lexiscan Myoview 08/2019   The left ventricular ejection fraction is mildly decreased (45-54%). Nuclear stress EF: 49%. There was no ST segment deviation noted during stress. Defect 1: There is a medium defect of severe severity present in the basal inferior and mid inferior location. This is a low risk study. Findings consistent with prior myocardial infarction.   MildAbnormal, low risk stress nuclear study with prior inferior infarct but no significant ischemia.  Gated ejection fraction 49% with hypokinesis of the inferior basal wall.  Left ventricular enlargement.  [place] 05/2017 Conclusions: Significant 2-vessel coronary artery disease involving the LAD and distal RCA/PDA, not significantly changed since prior catheterization in 02/2016. Normal left heart, right heart, and pulmonary artery pressures. Normal Fick cardiac output/index. Heavily calcified aortic valve with limited mobility on fluoroscopic images.   Recommendations: Proceed with outpatient cardiac surgery consultation in the setting of  severe aortic stenosis and multivessel coronary artery disease. Continue aggressive secondary prevention.   Nelva Bush, MD [facility] Pager: [phone]   R/L heart cath 02/2016  The left ventricular ejection fraction is 55-65% by visual estimate. There is no mitral valve regurgitation. The left ventricular systolic function is normal. There is moderate aortic valve stenosis. Dist RCA lesion, 100 %stenosed. Prox RCA to Dist RCA lesion, 40 %stenosed. Acute Mrg lesion, 80 %stenosed. 3rd Mrg lesion, 60 %stenosed. Mid LAD to Dist LAD lesion, 90 %stenosed. 1st Diag lesion, 60 %stenosed. Ost 1st Diag lesion, 50 %stenosed. Mid LAD lesion, 60 %stenosed. LV end diastolic pressure is normal. Hemodynamic findings consistent with pulmonary hypertension and aortic valve stenosis.   Total occlusion of the distal RCA supplied by left circumflex collaterals. 70% stenosis in the PDA on the mid inferior wall Segmentally severe, diffuse disease in the mid to distal LAD. LAD territory is relatively small compared to typical. The LAD does not reach the left ventricular apex. Moderate first diagonal, and circumflex disease. Mild to moderate aortic stenosis. Findings are similar to those obtained at echo. The valve is heavily calcified with restricted motion. Normal pulmonary artery pressures. Normal to low pulmonary capillary wedge. Normal left ventricular function with EF 60%.   RECOMMENDATIONS: Aggressive risk factor modification for CAD including LDL less than 70. With the diffuse nature of the disease in LAD I would recommend medical therapy rather than intervention. Should  he become symptomatic this area could be treated but would essentially be a full metal jacket from the distal LAD back to the mid segment and prevent future surgical revascularization. Initiate anti-ischemic therapy in the form of beta blocker and perhaps long-acting nitrates. Given the findings, I think he could be  cleared for his upcoming surgery. He will be an intermediate risk.    Laboratory Data:  High Sensitivity Troponin:   Recent Labs  Lab 09/13/20 0828  TROPONINIHS 10     Chemistry Recent Labs  Lab 09/13/20 0828  NA 137  K 4.2  CL 108  CO2 21*  GLUCOSE 96  BUN 13  CREATININE 0.68  CALCIUM 9.1  GFRNONAA >60  ANIONGAP 8    Recent Labs  Lab 09/13/20 0828  PROT 6.5  ALBUMIN 4.3  AST 32  ALT 23  ALKPHOS 68  BILITOT 1.6*   Hematology Recent Labs  Lab 09/13/20 0828  WBC 4.0  RBC 4.83  HGB 15.3  HCT 43.7  MCV 90.5  MCH 31.7  MCHC 35.0  RDW 12.9  PLT 99*   BNP Recent Labs  Lab 09/13/20 0828  BNP 31.8    DDimer No results for input(s): DDIMER in the last 168 hours.   Radiology/Studies:  DG Chest 2 View  Result Date: 09/13/2020 CLINICAL DATA:  Syncope EXAM: CHEST - 2 VIEW COMPARISON:  11/17/2017. FINDINGS: Cardiomediastinal silhouette is within normal limits and similar to prior. Median sternotomy. Linear left basilar opacities appear similar to prior and likely represent. No visible pleural effusions or pneumothorax. Degenerative change of the thoracic spine with flowing anterior osteophytes, suggestive of diffuse idiopathic skeletal hyperostosis. Osteopenia. IMPRESSION: Similar left basilar scar without evidence of acute cardiopulmonary disease. Electronically Signed   By: Margaretha Sheffield MD   On: 09/13/2020 08:54     Assessment and Plan:   Syncope - presented with LOC while sitting, mainly felt lightheaded>> severe diaphoresis. No CP, SOB, or palpitations. Reported heart rate into the 40s with high BP.  - H/o syncope in 2019 and he ultimately ended up with CABgx2 and AVR - He is not on an rate lower medications or antihypertensives - Labs are unremarkable. CXR non-actue - EKG pending - Tele shows SB with heart rates 50s, upper 40s, no high grade AV block noted - Orthostatics negative - Stat echo ordered given h/o AVR - If echo is unremarkable can  consider d/c with heart monitor, however patient still a little dizzy. Md to see.  Bicuspid AV with severe AS s/p AVR in 2019 - s/p AVR with a 74mm Edwards magna ease AVR 06/2017 at time of CABG - Echo 11/2018 showed stable AVR with mean valve gradient unchanged at 72.  - continue ASA - re-check echo as above  CAD s/p CABGx2 in 2019 - s/p CABG 2x with LIMA to LAD, SVG to dPDA - Lexiscan in 2021 without ischemia - patient denies anginal symptoms, he is pretty active - EkG ordered - continue ASA and statin  HTN - PTA not on antihypertensives - moderately elevated on presentation, now wnl  Dilated ascending aorta - stable at 10mm by echo 07/2017 and 50mm 8//2020 - repeat echo ordered - BP well controlled  HLD - LDL 39 Noc 2021 - continue Atorvastatin  OSA -patient reports compliance with CPAP  For questions or updates, please contact [facility] Please consult [email or web address] for contact info under    Signed, Tranae Laramie Ninfa Meeker, PA-C  09/13/2020 10:11 AM

## 2020-09-13 NOTE — ED Notes (Signed)
Pt presents to ED via EMS with c/o of dizziness and possibly LOC. Pt states he woke up this morning and was doing his morning stretches and became "severely dizzy and sweaty".   Pt states "if I passed out if was not for long". Pt states when he did wake up he had no issues remembering who he was or what had happened. Pt states dizzy is still there but states is much better.   Pt denies chest pain or SOB during this episode. NAD noted at this time. VSS. Pt currently A&Ox4 and ambulatory with steady gait. Pt denies any recent illness fever, chills or N/V/D.

## 2020-09-13 NOTE — H&P (Signed)
History and Physical    Todd Frank FXT:[id number] DOB: Apr 04, 1956 DOA: 09/13/2020  PCP: Olin Hauser, DO    Patient coming from:  Home    Chief Complaint:  Dizziness/ Syncope     HPI: DAIEL Frank is a 65 y.o. male seen in ed with complaints of dizziness and diaphoresis early am and then recalls waking up this morning. Pt was seen in ed and cardiology was consulted for syncope as pt has h/o CAD status post CABGx2 with AVR (AVR Magna Ease Aortic Valve- not on lifelong a/c) in 2019.Per edmd pt to have CTA for VTE eval.chart reviewed and pt was stable and chest pain free at last visit in may of this year. Pt denies any chest pain sob or palpitations, or headaches, dizziness, abdominal pain , urinary or bowel issues.  Pt has past medical history of aortic stenosis status post AVR in 2021, heart disease, diastolic dysfunction.He is not on any medications at home.   ED Course:  Vitals:   09/13/20 1200 09/13/20 1300 09/13/20 1600 09/13/20 1700  BP: 128/81 (!) 147/89 124/82 139/84  Pulse: (!) 57 (!) 56 61 73  Resp: 10 12 13 16   Temp:      TempSrc:      SpO2: 99% 98% 98% 96%  Weight:      Height:      In the ER BP 152/92, HR 51, RR 19, afebrile, 98% O2. Labs showed potassium 4.2, sodium 137, creatinine 0.68, BUN 13, Mag 2.1, LFTs wnl, BNP wnl, WBC 4.0, Hgb 15.3. HS trop 10. CXR without acute disease. EKG pending. Tele showed SB with heart rates upper 40s and 50s, no AV block noted. Stat echo pending.  CMP is normal except t.bilirubin at 1.6. cbc is normal except for platelet count of 99.   Review of Systems:  Review of Systems  Constitutional: Negative.   HENT: Negative.    Cardiovascular: Negative.   Skin: Negative.   Neurological: Negative.   Endo/Heme/Allergies: Negative.   All other systems reviewed and are negative.   Past Medical History:  Diagnosis Date   Aortic stenosis    a. syncope/severe AS s/p bioprosthetic AVR 06/2017.   Arthritis    Ascending  aorta dilatation (HCC)    62mm by echo 11/2019   Bicuspid aortic valve    Bradycardia    secondary to BB   Broken clavicle    as child, can still dislocate at times   CAD (coronary artery disease), native coronary artery    a. s/p 2V CABG at time of AVR 06/2017.   Carotid bruit    carotid dopplers negative.  Bruit due to heart murmur from AS   Congenital nevus    Diastolic dysfunction    Dysplastic nevus 12/12/2008   Left mid back, 9cm lat. to spine. Moderate atypia, extends to one edge.    Dysplastic nevus 12/12/2008   Left mid to low back, 7cm lat. to spine. Moderate atypia, close to margin.    Dysplastic nevus 09/24/2012   Right mid lateral buttocks. Moderate atypia, close to edge.    Dysplastic nevus 08/15/2020   Left mid back infrascapular - needs excision   GERD (gastroesophageal reflux disease)    Headache    optical migraine   Hx of melanoma in situ 06/26/2009   Left superior thigh. Arising in dysplastic nevus, close to margin. Excised 07/11/2009, margins free.   Hyperlipidemia    LDL goal < 70   LVH (left ventricular hypertrophy)  Melanoma of skin ([place])    Obesity    PFO (patent foramen ovale)    small by echo 2014, but not demonstrated during intra-op note from CABG/AVR 06/2017.    Past Surgical History:  Procedure Laterality Date   AORTIC VALVE REPLACEMENT N/A 06/25/2017   Procedure: AORTIC VALVE REPLACEMENT (AVR) using Magna Ease Aortic Valve size 38mm;  Surgeon: Ivin Poot, MD;  Location: [facility];  Service: Open Heart Surgery;  Laterality: N/A;   arthritic cyst removal  2017   from Lumbar 4-5, dr pool   CARDIAC CATHETERIZATION N/A 02/20/2016   Procedure: Right/Left Heart Cath and Coronary Angiography;  Surgeon: Belva Crome, MD;  Location: [facility];  Service: Cardiovascular;  Laterality: N/A;   CORONARY ARTERY BYPASS GRAFT N/A 06/25/2017   Procedure: CORONARY ARTERY BYPASS GRAFTING (CABG) x Two , using left internal mammary artery and right leg  greater saphenous vein harvested endoscopically;  Surgeon: Ivin Poot, MD;  Location: [facility];  Service: Open Heart Surgery;  Laterality: N/A;   CORONARY/GRAFT ANGIOGRAPHY N/A 06/02/2017   Procedure: CORONARY/GRAFT ANGIOGRAPHY;  Surgeon: Nelva Bush, MD;  Location: [facility];  Service: Cardiovascular;  Laterality: N/A;   EYE SURGERY     MELANOMA EXCISION  2011   left thigh   RIGHT HEART CATH N/A 06/02/2017   Procedure: RIGHT HEART CATH;  Surgeon: Nelva Bush, MD;  Location: [facility];  Service: Cardiovascular;  Laterality: N/A;   TEE WITHOUT CARDIOVERSION N/A 06/25/2017   Procedure: TRANSESOPHAGEAL ECHOCARDIOGRAM (TEE);  Surgeon: Prescott Gum, Collier Salina, MD;  Location: [facility];  Service: Open Heart Surgery;  Laterality: N/A;   TONSILLECTOMY       reports that he quit smoking about 44 years ago. He smoked an average of 1.00 packs per day. He has quit using smokeless tobacco. He reports current alcohol use of about 1.0 standard drink of alcohol per week. He reports that he does not use drugs.  No Known Allergies  Family History  Problem Relation Age of Onset   Diabetes Mother    Heart disease Mother    Arthritis Father    Arthritis Sister    Heart disease Paternal Grandmother        MI   Heart attack Other        family history   Pancreatic cancer Maternal Aunt     Prior to Admission medications   Medication Sig Start Date End Date Taking? Authorizing Provider  aspirin EC 81 MG tablet Take 81 mg by mouth daily.    [provider]  atorvastatin (LIPITOR) 80 MG tablet Take 1 tablet (80 mg total) by mouth daily. 08/27/20   [name], MD  azelastine (ASTELIN) 0.1 % nasal spray Place 1 spray into both nostrils 2 (two) times daily. Use in each nostril as directed 02/15/19   Olin Hauser, DO  cholecalciferol (VITAMIN D) 1000 units tablet Take 1,000 Units by mouth 2 (two) times daily.    [provider]  Coenzyme Q10 (COQ-10) 200 MG  CAPS Take 200 mg by mouth daily.    [provider]  fluticasone (FLONASE) 50 MCG/ACT nasal spray Place 2 sprays into both nostrils daily as needed (for allergies.). 08/31/20   Olin Hauser, DO  vitamin B-12 (CYANOCOBALAMIN) 1000 MCG tablet Take 1,000 mcg by mouth daily.    [provider]    Physical Exam: Vitals:   09/13/20 1200 09/13/20 1300 09/13/20 1600 09/13/20 1700  BP: 128/81 Marland Kitchen)  147/89 124/82 139/84  Pulse: (!) 57 (!) 56 61 73  Resp: 10 12 13 16   Temp:      TempSrc:      SpO2: 99% 98% 98% 96%  Weight:      Height:       Physical Exam Vitals and nursing note reviewed.  Constitutional:      General: He is not in acute distress.    Appearance: He is not ill-appearing.  HENT:     Head: Normocephalic and atraumatic.     Right Ear: External ear normal.     Left Ear: External ear normal.     Mouth/Throat:     Mouth: Mucous membranes are moist.  Eyes:     Extraocular Movements: Extraocular movements intact.     Pupils: Pupils are equal, round, and reactive to light.  Neck:     Vascular: No carotid bruit.  Cardiovascular:     Rate and Rhythm: Normal rate and regular rhythm.     Pulses: Normal pulses.     Heart sounds: Murmur heard.  Pulmonary:     Effort: Pulmonary effort is normal.     Breath sounds: Normal breath sounds.  Abdominal:     General: Bowel sounds are normal. There is abdominal bruit. There is no distension.     Palpations: Abdomen is soft.     Tenderness: There is no abdominal tenderness. There is no guarding.  Musculoskeletal:     Right lower leg: No edema.     Left lower leg: No edema.  Skin:    General: Skin is warm.  Neurological:     General: No focal deficit present.     Mental Status: He is alert and oriented to person, place, and time.     Cranial Nerves: No cranial nerve deficit.     Motor: No weakness.  Psychiatric:        Mood and Affect: Mood normal.        Behavior: Behavior normal.     Labs on  Admission: I have personally reviewed following labs and imaging studies  No results for input(s): CKTOTAL, CKMB, TROPONINI in the last 72 hours. Lab Results  Component Value Date   WBC 4.0 09/13/2020   HGB 15.3 09/13/2020   HCT 43.7 09/13/2020   MCV 90.5 09/13/2020   PLT 99 (L) 09/13/2020    Recent Labs  Lab 09/13/20 0828  NA 137  K 4.2  CL 108  CO2 21*  BUN 13  CREATININE 0.68  CALCIUM 9.1  PROT 6.5  BILITOT 1.6*  ALKPHOS 68  ALT 23  AST 32  GLUCOSE 96   Lab Results  Component Value Date   CHOL 110 02/13/2020   HDL 55 02/13/2020   LDLCALC 39 02/13/2020   TRIG 82 02/13/2020   No results found for: DDIMER Invalid input(s): POCBNP  Urinalysis    Component Value Date/Time   COLORURINE STRAW (A) 06/23/2017 1046   APPEARANCEUR CLEAR 06/23/2017 1046   LABSPEC 1.008 06/23/2017 1046   PHURINE 6.0 06/23/2017 1046   GLUCOSEU NEGATIVE 06/23/2017 1046   HGBUR NEGATIVE 06/23/2017 1046   BILIRUBINUR NEGATIVE 06/23/2017 1046   KETONESUR NEGATIVE 06/23/2017 1046   PROTEINUR NEGATIVE 06/23/2017 1046   UROBILINOGEN 0.2 10/28/2007 2316   NITRITE NEGATIVE 06/23/2017 1046   LEUKOCYTESUR NEGATIVE 06/23/2017 1046   COVID-19 Labs No results for input(s): DDIMER, FERRITIN, LDH, CRP in the last 72 hours. Lab Results  Component Value Date   [place] NEGATIVE 09/13/2020  Radiological Exams on Admission: DG Chest 2 View  Result Date: 09/13/2020 CLINICAL DATA:  Syncope EXAM: CHEST - 2 VIEW COMPARISON:  11/17/2017. FINDINGS: Cardiomediastinal silhouette is within normal limits and similar to prior. Median sternotomy. Linear left basilar opacities appear similar to prior and likely represent. No visible pleural effusions or pneumothorax. Degenerative change of the thoracic spine with flowing anterior osteophytes, suggestive of diffuse idiopathic skeletal hyperostosis. Osteopenia. IMPRESSION: Similar left basilar scar without evidence of acute cardiopulmonary disease.  Electronically Signed   By: Margaretha Sheffield MD   On: 09/13/2020 08:54   CT ANGIO CHEST AORTA W/CM & OR WO/CM  Result Date: 09/13/2020 CLINICAL DATA:  Dizziness. EXAM: CT ANGIOGRAPHY CHEST WITH CONTRAST TECHNIQUE: Multidetector CT imaging of the chest was performed using the standard protocol during bolus administration of intravenous contrast. Multiplanar CT image reconstructions and MIPs were obtained to evaluate the vascular anatomy. CONTRAST:  32mL OMNIPAQUE IOHEXOL 350 MG/ML SOLN COMPARISON:  Chest x-ray from same day. CT chest dated June 19, 2017. FINDINGS: Cardiovascular: Suboptimal opacification of the pulmonary arteries to the segmental level. No central or lobar pulmonary embolism. No definite segmental pulmonary embolism. Unchanged mild cardiomegaly. No pericardial effusion. Stable aneurysmal dilatation of the ascending thoracic aorta measuring up to 4.3 cm. Prior CABG and AVR. Mediastinum/Nodes: No enlarged mediastinal, hilar, or axillary lymph nodes. Thyroid gland, trachea, and esophagus demonstrate no significant findings. Lungs/Pleura: Lungs are clear. No pleural effusion or pneumothorax. Upper Abdomen: No acute abnormality.  Tiny gallstones. Musculoskeletal: Bilateral gynecomastia. No acute or significant osseous findings. Bilateral glenohumeral osteoarthritis with intra-articular bodies. Review of the MIP images confirms the above findings. IMPRESSION: 1. Suboptimal opacification of the pulmonary arteries. No central or lobar pulmonary embolism. No definite segmental pulmonary embolism. 2. Stable aneurysmal dilatation of the ascending thoracic aorta measuring up to 4.3 cm. Recommend annual imaging followup by CTA or MRA. This recommendation follows 2010 ACCF/AHA/AATS/ACR/ASA/SCA/SCAI/SIR/STS/SVM Guidelines for the Diagnosis and Management of Patients with Thoracic Aortic Disease. Circulation. 2010; 121: D638-V564. Aortic aneurysm NOS (ICD10-I71.9) 3. Unchanged cholelithiasis. Electronically  Signed   By: Titus Dubin M.D.   On: 09/13/2020 13:02   ECHOCARDIOGRAM COMPLETE  Result Date: 09/13/2020    ECHOCARDIOGRAM REPORT   Patient Name:   LEONARDO MAKRIS Date of Exam: 09/13/2020 Medical Rec #:  [id number]      Height:       69.0 in Accession #:    [id number]     Weight:       180.6 lb Date of Birth:  1955-09-02      BSA:          1.978 m Patient Age:    32 years       BP:           139/91 mmHg Patient Gender: M              HR:           52 bpm. Exam Location:  [facility] Procedure: 2D Echo, Color Doppler, Cardiac Doppler and Strain Analysis STAT ECHO Indications:     R55 Syncope  History:         Patient has prior history of Echocardiogram examinations, most                  recent 11/09/2019. CAD, Aortic Valve Disease; Risk Factors:Former                  Smoker, Hypertension and Dyslipidemia.  Sonographer:     Charmayne Sheer RDCS (  AE) Referring Phys:  [id number] [place] Diagnosing Phys: Kathlyn Sacramento MD  Sonographer Comments: Global longitudinal strain was attempted.  IMPRESSIONS   1. Left ventricular ejection fraction, by estimation, is 60 to 65%. The left ventricle has normal function. The left ventricle has no regional wall motion abnormalities. There is mild left ventricular hypertrophy. Left ventricular diastolic parameters are consistent with Grade I diastolic dysfunction (impaired relaxation).  2. Right ventricular systolic function is normal. The right ventricular size is normal. Tricuspid regurgitation signal is inadequate for assessing PA pressure.  3. Left atrial size was mildly dilated.  4. Right atrial size was mildly dilated.  5. The mitral valve is normal in structure. Mild mitral valve regurgitation. No evidence of mitral stenosis.  6. The aortic valve has been repaired/replaced. Aortic valve regurgitation is not visualized. No aortic stenosis is present. Echo findings are consistent with normal structure and function of the aortic valve prosthesis. Aortic valve area, by VTI measures 2.11  cm. Aortic valve mean gradient measures 18.0 mmHg.  7. Aortic dilatation noted. There is moderate dilatation of the ascending aorta, measuring 47 mm. Only the proximal portion was visualized.  FINDINGS:   Left Ventricle: Left ventricular ejection fraction, by estimation, is 60 to 65%. The left ventricle has normal function. The left ventricle has no regional wall motion abnormalities. The left ventricular internal cavity size was normal in size. There is  mild left ventricular hypertrophy. Left ventricular diastolic parameters are consistent with Grade I diastolic dysfunction (impaired relaxation). Right Ventricle: The right ventricular size is normal. No increase in right ventricular wall thickness. Right ventricular systolic function is normal. Tricuspid regurgitation signal is inadequate for assessing PA pressure. Left Atrium: Left atrial size was mildly dilated. Right Atrium: Right atrial size was mildly dilated. Pericardium: There is no evidence of pericardial effusion. Mitral Valve: The mitral valve is normal in structure. Mild mitral valve regurgitation. No evidence of mitral valve stenosis. MV peak gradient, 4.5 mmHg. The mean mitral valve gradient is 1.0 mmHg. Tricuspid Valve: The tricuspid valve is normal in structure. Tricuspid valve regurgitation is trivial. No evidence of tricuspid stenosis. Aortic Valve: The aortic valve has been repaired/replaced. Aortic valve regurgitation is not visualized. No aortic stenosis is present. Aortic valve mean gradient measures 18.0 mmHg. Aortic valve peak gradient measures 30.2 mmHg. Aortic valve area, by VTI measures 2.11 cm. There is a bovine valve present in the aortic position. Echo findings are consistent with normal structure and function of the aortic valve prosthesis. Pulmonic Valve: The pulmonic valve was normal in structure. Pulmonic valve regurgitation is not visualized. No evidence of pulmonic stenosis. Aorta: Aortic dilatation noted. There is moderate  dilatation of the ascending aorta, measuring 47 mm. Venous: The inferior vena cava was not well visualized. IAS/Shunts: No atrial level shunt detected by color flow Doppler.  LEFT VENTRICLE PLAX 2D LVIDd:         4.70 cm  Diastology LVIDs:         2.90 cm  LV e' medial:    4.24 cm/s LV PW:         1.60 cm  LV E/e' medial:  18.4 LV IVS:        1.10 cm  LV e' lateral:   9.46 cm/s LVOT diam:     1.90 cm  LV E/e' lateral: 8.2 LV SV:         125 LV SV Index:   63       2D Longitudinal Strain LVOT Area:  2.84 cm 2D Strain GLS Avg:     -16.3 %  RIGHT VENTRICLE RV Basal diam:  4.10 cm LEFT ATRIUM             Index       RIGHT ATRIUM           Index LA diam:        4.40 cm 2.22 cm/m  RA Area:     20.20 cm LA Vol (A2C):   31.3 ml 15.82 ml/m RA Volume:   57.30 ml  28.96 ml/m LA Vol (A4C):   54.5 ml 27.55 ml/m LA Biplane Vol: 45.6 ml 23.05 ml/m  AORTIC VALVE                    PULMONIC VALVE AV Area (Vmax):    1.75 cm     PV Vmax:       1.15 m/s AV Area (Vmean):   1.73 cm     PV Vmean:      84.000 cm/s AV Area (VTI):     2.11 cm     PV VTI:        0.182 m AV Vmax:           275.00 cm/s  PV Peak grad:  5.3 mmHg AV Vmean:          203.000 cm/s PV Mean grad:  3.0 mmHg AV VTI:            0.593 m AV Peak Grad:      30.2 mmHg AV Mean Grad:      18.0 mmHg LVOT Vmax:         170.00 cm/s LVOT Vmean:        124.000 cm/s LVOT VTI:          0.442 m LVOT/AV VTI ratio: 0.75  AORTA Ao Root diam: 2.50 cm MITRAL VALVE MV Area (PHT): 2.51 cm    SHUNTS MV Area VTI:   3.40 cm    Systemic VTI:  0.44 m MV Peak grad:  4.5 mmHg    Systemic Diam: 1.90 cm MV Mean grad:  1.0 mmHg MV Vmax:       1.06 m/s MV Vmean:      55.9 cm/s MV Decel Time: 302 msec MV E velocity: 78.00 cm/s MV A velocity: 96.40 cm/s MV E/A ratio:  0.81 Kathlyn Sacramento MD Electronically signed by Kathlyn Sacramento MD Signature Date/Time: 09/13/2020/12:05:04 PM    Final     EKG: Independently reviewed.  None.    Assessment/Plan Principal Problem:   Syncope Active  Problems:   Severe aortic stenosis   S/P aortic valve replacement with bioprosthetic valve   Essential hypertension   Hypercholesteremia   CAD (coronary artery disease), native coronary artery   Thrombocytopenia (HCC)   OSA on CPAP   Aneurysm of thoracic aorta (HCC)   Syncope: We will admit pt for observation and obtain CT head noncontrast and MRI. Echo as above. Asa held due to thrombocytopenia.  Severe aortic stenosis / sp AVR: Cardiology has been consulted.  NO AC.  HTN: Blood pressure 139/84, pulse 73, temperature 98.2 F (36.8 C), temperature source Oral, resp. rate 16, height 5\' 9"  (1.753 m), weight 81.9 kg, SpO2 96 %. Pt states he doe snot take any bp meds we will start prn hydralazine.   HLD: Cont lipitor. Am lipid panel.   CAD: Cont statin therapy. EKG per cards note is S Rhythm. Aspirin held due to thrombocytopenia.  Thrombocytopenia: Pt has been getting worse as far as thrombocytopenia, but has been going on for past few years.  Hematology consult per am team.    OSA on CPAP; Cpap per home setting.   Aneurysm of thoracic aorta: Finding on cta. Pt needs followup.  Abdominal Bruit: [place] abd.   DVT prophylaxis:  Heparin   Code Status:  Full Code   Family Communication:  Naksh, Radi (Spouse)  [phone] (Mobile)   Disposition Plan:  Home    Consults called:  Cardiology  Admission status: Inpatient     Para Skeans MD [facility] [phone] How to contact the [facility] Attending or Consulting provider [place] or covering provider during after hours [place], for this patient.    Check the care team in [facility] and look for a) attending/consulting [facility] provider listed and b) the [facility] team listed Log into [email or web address] and use [place]'s universal password to access. If you do not have the password, please contact the hospital operator. Locate the [facility] provider you are looking for under [facility] and page to a number that you can be  directly reached. If you still have difficulty reaching the provider, please page the [facility] (Director on Call) for the Hospitalists listed on amion for assistance. [email or web address] Password [email or web address] 09/13/2020, 7:45 PM

## 2020-09-13 NOTE — Progress Notes (Signed)
*  PRELIMINARY RESULTS* Echocardiogram 2D Echocardiogram has been performed.  Todd Frank 09/13/2020, 11:50 AM

## 2020-09-13 NOTE — ED Notes (Signed)
Echo at bedside

## 2020-09-14 ENCOUNTER — Observation Stay (HOSPITAL_BASED_OUTPATIENT_CLINIC_OR_DEPARTMENT_OTHER)
Admit: 2020-09-14 | Discharge: 2020-09-14 | Disposition: A | Discharge status: Home / Self Care | Payer: Medicare PPO | Attending: Medical | Admitting: Medical

## 2020-09-14 DIAGNOSIS — R55 Syncope and collapse: Secondary | ICD-10-CM

## 2020-09-14 DIAGNOSIS — I712 Thoracic aortic aneurysm, without rupture: Secondary | ICD-10-CM | POA: Diagnosis not present

## 2020-09-14 DIAGNOSIS — I2510 Atherosclerotic heart disease of native coronary artery without angina pectoris: Secondary | ICD-10-CM | POA: Diagnosis not present

## 2020-09-14 NOTE — Discharge Planning (Signed)
Patient IV removed.  Discussed future s/sx of syncope and how to monitor - no driving till work up complete.  Discharge AVS given, explained and educated.  No scripts needed at this time and FU appts made/informed.  Outpatient cardiac monitor placed and code 44 to be completed by SW prior to DC.  Once ready, will be wheeled to front of ED and family transporting home via car.

## 2020-09-14 NOTE — Discharge Summary (Signed)
Physician Discharge Summary  Todd Frank JGO:[id number] DOB: [birth year] DOA: 09/13/2020  PCP: Olin Hauser, DO  Admit date: 09/13/2020 Discharge date: 09/14/2020  Admitted From: home Disposition:  home  Recommendations for Outpatient Follow-up:  Follow up with PCP in 1-2 weeks  Home Health: none Equipment/Devices: none  Discharge Condition: stable CODE STATUS: Full code Diet recommendation: heart healthy  HPI: Per admitting MD, Todd Frank is a 65 y.o. male seen in ed with complaints of dizziness and diaphoresis early am and then recalls waking up this morning. Pt was seen in ed and cardiology was consulted for syncope as pt has h/o CAD status post CABGx2 with AVR (AVR Magna Ease Aortic Valve- not on lifelong a/c) in 2019.Per edmd pt to have CTA for VTE eval.chart reviewed and pt was stable and chest pain free at last visit in may of this year. Pt denies any chest pain sob or palpitations, or headaches, dizziness, abdominal pain , urinary or bowel issues Pt has past medical history of aortic stenosis status post AVR in 2021, heart disease, diastolic dysfunction.He is not on any medications at home.   Hospital Course / Discharge diagnoses: Principal problem  Syncope - patient was admitted to the hospital with a syncopal episode. Due to his cardiac history cardiology was consulted and followed patient while hospitalized. His telemetry was without significant events. He underwent a 2D echo which showed normal EF 60-65%, grade 1 DD and unremarkable Ao valve prosthesis. Brain MRI without acute processes. He has remained stable, cardiology will arrange outpatient monitor and will be discharged home in stable condition.   Active problems Severe aortic stenosis / sp AVR - valve OK on echo HTN- resume home medications HLD - resume home medications CAD - resume home medications. Troponin stable Thrombocytopenia - chronic, stable OSA on CPAP - home Cpap Aneurysm of thoracic  aorta - CTA stable, outpatient follow up   Sepsis ruled out   Discharge Instructions   Allergies as of 09/14/2020   No Known Allergies      Medication List     TAKE these medications    aspirin EC 81 MG tablet Take 81 mg by mouth daily.   atorvastatin 80 MG tablet Commonly known as: LIPITOR Take 1 tablet (80 mg total) by mouth daily. What changed: when to take this   cholecalciferol 1000 units tablet Commonly known as: VITAMIN D Take 1,000 Units by mouth every evening.   CoQ-10 200 MG Caps Take 200 mg by mouth daily.   fluticasone 50 MCG/ACT nasal spray Commonly known as: FLONASE Place 2 sprays into both nostrils daily as needed (for allergies.).   vitamin B-12 1000 MCG tablet Commonly known as: CYANOCOBALAMIN Take 1,000 mcg by mouth daily.         Consultations: Cardiology   Procedures/Studies:  DG Chest 2 View  Result Date: 09/13/2020 CLINICAL DATA:  Syncope EXAM: CHEST - 2 VIEW COMPARISON:  11/17/2017. FINDINGS: Cardiomediastinal silhouette is within normal limits and similar to prior. Median sternotomy. Linear left basilar opacities appear similar to prior and likely represent. No visible pleural effusions or pneumothorax. Degenerative change of the thoracic spine with flowing anterior osteophytes, suggestive of diffuse idiopathic skeletal hyperostosis. Osteopenia. IMPRESSION: Similar left basilar scar without evidence of acute cardiopulmonary disease. Electronically Signed   By: Margaretha Sheffield MD   On: 09/13/2020 08:54   CT HEAD WO CONTRAST  Result Date: 09/13/2020 CLINICAL DATA:  Syncope.  Diaphoresis EXAM: CT HEAD WITHOUT CONTRAST TECHNIQUE: Contiguous axial images  were obtained from the base of the skull through the vertex without intravenous contrast. COMPARISON:  None. FINDINGS: Brain: No acute intracranial hemorrhage. No focal mass lesion. No CT evidence of acute infarction. No midline shift or mass effect. No hydrocephalus. Basilar cisterns are  patent. Vascular: No hyperdense vessel or unexpected calcification. Skull: Normal. Negative for fracture or focal lesion. Sinuses/Orbits: Paranasal sinuses and mastoid air cells are clear. Orbits are clear. Other: None. IMPRESSION: No acute intracranial findings. Electronically Signed   By: Suzy Bouchard M.D.   On: 09/13/2020 20:37   MR BRAIN WO CONTRAST  Result Date: 09/13/2020 CLINICAL DATA:  Initial evaluation for acute syncope. EXAM: MRI HEAD WITHOUT CONTRAST TECHNIQUE: Multiplanar, multiecho pulse sequences of the brain and surrounding structures were obtained without intravenous contrast. COMPARISON:  Prior CT from earlier the same day. FINDINGS: Brain: Cerebral volume within normal limits. Mild chronic microvascular ischemic disease noted involving the periventricular white matter. Few small remote right cerebellar infarcts noted. No abnormal foci of restricted diffusion to suggest acute or subacute ischemia. Gray-white matter differentiation maintained. No encephalomalacia to suggest chronic cortical infarction. No evidence for acute or chronic intracranial hemorrhage. No mass lesion, midline shift or mass effect. No hydrocephalus or extra-axial fluid collection. Pituitary gland suprasellar region normal. Midline structures intact. Vascular: Major intracranial vascular flow voids are maintained. Skull and upper cervical spine: Craniocervical junction within normal limits. Bone marrow signal intensity normal. No scalp soft tissue abnormality. Sinuses/Orbits: Globes and orbital soft tissues within normal limits. Paranasal sinuses are largely clear. No mastoid effusion. Inner ear structures grossly normal. Other: None. IMPRESSION: 1. No acute intracranial abnormality. 2. Few small remote right cerebellar infarcts. 3. Mild chronic microvascular ischemic disease for age. Electronically Signed   By: Jeannine Boga M.D.   On: 09/13/2020 22:47   CT ANGIO CHEST AORTA W/CM & OR WO/CM  Result Date:  09/13/2020 CLINICAL DATA:  Dizziness. EXAM: CT ANGIOGRAPHY CHEST WITH CONTRAST TECHNIQUE: Multidetector CT imaging of the chest was performed using the standard protocol during bolus administration of intravenous contrast. Multiplanar CT image reconstructions and MIPs were obtained to evaluate the vascular anatomy. CONTRAST:  47mL OMNIPAQUE IOHEXOL 350 MG/ML SOLN COMPARISON:  Chest x-ray from same day. CT chest dated June 19, 2017. FINDINGS: Cardiovascular: Suboptimal opacification of the pulmonary arteries to the segmental level. No central or lobar pulmonary embolism. No definite segmental pulmonary embolism. Unchanged mild cardiomegaly. No pericardial effusion. Stable aneurysmal dilatation of the ascending thoracic aorta measuring up to 4.3 cm. Prior CABG and AVR. Mediastinum/Nodes: No enlarged mediastinal, hilar, or axillary lymph nodes. Thyroid gland, trachea, and esophagus demonstrate no significant findings. Lungs/Pleura: Lungs are clear. No pleural effusion or pneumothorax. Upper Abdomen: No acute abnormality.  Tiny gallstones. Musculoskeletal: Bilateral gynecomastia. No acute or significant osseous findings. Bilateral glenohumeral osteoarthritis with intra-articular bodies. Review of the MIP images confirms the above findings. IMPRESSION: 1. Suboptimal opacification of the pulmonary arteries. No central or lobar pulmonary embolism. No definite segmental pulmonary embolism. 2. Stable aneurysmal dilatation of the ascending thoracic aorta measuring up to 4.3 cm. Recommend annual imaging followup by CTA or MRA. This recommendation follows 2010 ACCF/AHA/AATS/ACR/ASA/SCA/SCAI/SIR/STS/SVM Guidelines for the Diagnosis and Management of Patients with Thoracic Aortic Disease. Circulation. 2010; 121: O270-J500. Aortic aneurysm NOS (ICD10-I71.9) 3. Unchanged cholelithiasis. Electronically Signed   By: Titus Dubin M.D.   On: 09/13/2020 13:02   ECHOCARDIOGRAM COMPLETE  Result Date: 09/13/2020    ECHOCARDIOGRAM  REPORT   Patient Name:   Todd Frank Date of Exam: 09/13/2020  Medical Rec #:  [id number]      Height:       69.0 in Accession #:    [id number]     Weight:       180.6 lb Date of Birth:  12-Sep-1955      BSA:          1.978 m Patient Age:    66 years       BP:           139/91 mmHg Patient Gender: M              HR:           52 bpm. Exam Location:  [facility] Procedure: 2D Echo, Color Doppler, Cardiac Doppler and Strain Analysis STAT ECHO Indications:     R55 Syncope  History:         Patient has prior history of Echocardiogram examinations, most                  recent 11/09/2019. CAD, Aortic Valve Disease; Risk Factors:Former                  Smoker, Hypertension and Dyslipidemia.  Sonographer:     Charmayne Sheer RDCS (AE) Referring Phys:  [id number] [place] Diagnosing Phys: Kathlyn Sacramento MD  Sonographer Comments: Global longitudinal strain was attempted. IMPRESSIONS  1. Left ventricular ejection fraction, by estimation, is 60 to 65%. The left ventricle has normal function. The left ventricle has no regional wall motion abnormalities. There is mild left ventricular hypertrophy. Left ventricular diastolic parameters are consistent with Grade I diastolic dysfunction (impaired relaxation).  2. Right ventricular systolic function is normal. The right ventricular size is normal. Tricuspid regurgitation signal is inadequate for assessing PA pressure.  3. Left atrial size was mildly dilated.  4. Right atrial size was mildly dilated.  5. The mitral valve is normal in structure. Mild mitral valve regurgitation. No evidence of mitral stenosis.  6. The aortic valve has been repaired/replaced. Aortic valve regurgitation is not visualized. No aortic stenosis is present. Echo findings are consistent with normal structure and function of the aortic valve prosthesis. Aortic valve area, by VTI measures 2.11 cm. Aortic valve mean gradient measures 18.0 mmHg.  7. Aortic dilatation noted. There is moderate dilatation of the ascending  aorta, measuring 47 mm. Only the proximal portion was visualized. FINDINGS  Left Ventricle: Left ventricular ejection fraction, by estimation, is 60 to 65%. The left ventricle has normal function. The left ventricle has no regional wall motion abnormalities. The left ventricular internal cavity size was normal in size. There is  mild left ventricular hypertrophy. Left ventricular diastolic parameters are consistent with Grade I diastolic dysfunction (impaired relaxation). Right Ventricle: The right ventricular size is normal. No increase in right ventricular wall thickness. Right ventricular systolic function is normal. Tricuspid regurgitation signal is inadequate for assessing PA pressure. Left Atrium: Left atrial size was mildly dilated. Right Atrium: Right atrial size was mildly dilated. Pericardium: There is no evidence of pericardial effusion. Mitral Valve: The mitral valve is normal in structure. Mild mitral valve regurgitation. No evidence of mitral valve stenosis. MV peak gradient, 4.5 mmHg. The mean mitral valve gradient is 1.0 mmHg. Tricuspid Valve: The tricuspid valve is normal in structure. Tricuspid valve regurgitation is trivial. No evidence of tricuspid stenosis. Aortic Valve: The aortic valve has been repaired/replaced. Aortic valve regurgitation is not visualized. No aortic stenosis is present. Aortic valve mean gradient measures 18.0 mmHg. Aortic valve peak  gradient measures 30.2 mmHg. Aortic valve area, by VTI measures 2.11 cm. There is a bovine valve present in the aortic position. Echo findings are consistent with normal structure and function of the aortic valve prosthesis. Pulmonic Valve: The pulmonic valve was normal in structure. Pulmonic valve regurgitation is not visualized. No evidence of pulmonic stenosis. Aorta: Aortic dilatation noted. There is moderate dilatation of the ascending aorta, measuring 47 mm. Venous: The inferior vena cava was not well visualized. IAS/Shunts: No atrial  level shunt detected by color flow Doppler.  LEFT VENTRICLE PLAX 2D LVIDd:         4.70 cm  Diastology LVIDs:         2.90 cm  LV e' medial:    4.24 cm/s LV PW:         1.60 cm  LV E/e' medial:  18.4 LV IVS:        1.10 cm  LV e' lateral:   9.46 cm/s LVOT diam:     1.90 cm  LV E/e' lateral: 8.2 LV SV:         125 LV SV Index:   63       2D Longitudinal Strain LVOT Area:     2.84 cm 2D Strain GLS Avg:     -16.3 %  RIGHT VENTRICLE RV Basal diam:  4.10 cm LEFT ATRIUM             Index       RIGHT ATRIUM           Index LA diam:        4.40 cm 2.22 cm/m  RA Area:     20.20 cm LA Vol (A2C):   31.3 ml 15.82 ml/m RA Volume:   57.30 ml  28.96 ml/m LA Vol (A4C):   54.5 ml 27.55 ml/m LA Biplane Vol: 45.6 ml 23.05 ml/m  AORTIC VALVE                    PULMONIC VALVE AV Area (Vmax):    1.75 cm     PV Vmax:       1.15 m/s AV Area (Vmean):   1.73 cm     PV Vmean:      84.000 cm/s AV Area (VTI):     2.11 cm     PV VTI:        0.182 m AV Vmax:           275.00 cm/s  PV Peak grad:  5.3 mmHg AV Vmean:          203.000 cm/s PV Mean grad:  3.0 mmHg AV VTI:            0.593 m AV Peak Grad:      30.2 mmHg AV Mean Grad:      18.0 mmHg LVOT Vmax:         170.00 cm/s LVOT Vmean:        124.000 cm/s LVOT VTI:          0.442 m LVOT/AV VTI ratio: 0.75  AORTA Ao Root diam: 2.50 cm MITRAL VALVE MV Area (PHT): 2.51 cm    SHUNTS MV Area VTI:   3.40 cm    Systemic VTI:  0.44 m MV Peak grad:  4.5 mmHg    Systemic Diam: 1.90 cm MV Mean grad:  1.0 mmHg MV Vmax:       1.06 m/s MV Vmean:      55.9 cm/s MV Decel  Time: 302 msec MV E velocity: 78.00 cm/s MV A velocity: 96.40 cm/s MV E/A ratio:  0.81 Kathlyn Sacramento MD Electronically signed by Kathlyn Sacramento MD Signature Date/Time: 09/13/2020/12:05:04 PM    Final      Subjective: - no chest pain, shortness of breath, no abdominal pain, nausea or vomiting.   Discharge Exam: BP 128/82   Pulse (!) 51   Temp 98.2 F (36.8 C) (Oral)   Resp 12   Ht 5\' 9"  (1.753 m)   Wt 81.9 kg   SpO2 97%    BMI 26.67 kg/m   General: Pt is alert, awake, not in acute distress Cardiovascular: RRR, S1/S2 +, no rubs, no gallops Respiratory: CTA bilaterally, no wheezing, no rhonchi Abdominal: Soft, NT, ND, bowel sounds + Extremities: no edema, no cyanosis    The results of significant diagnostics from this hospitalization (including imaging, microbiology, ancillary and laboratory) are listed below for reference.     Microbiology: Recent Results (from the past 240 hour(s))  Resp Panel by RT-PCR (Flu A&B, Covid) Nasopharyngeal Swab     Status: None   Collection Time: 09/13/20 11:03 AM   Specimen: Nasopharyngeal Swab; Nasopharyngeal(NP) swabs in vial transport medium  Result Value Ref Range Status   SARS Coronavirus 2 by RT PCR NEGATIVE NEGATIVE Final    Comment: (NOTE) SARS-CoV-2 target nucleic acids are NOT DETECTED.  The SARS-CoV-2 RNA is generally detectable in upper respiratory specimens during the acute phase of infection. The lowest concentration of SARS-CoV-2 viral copies this assay can detect is 138 copies/mL. A negative result does not preclude SARS-Cov-2 infection and should not be used as the sole basis for treatment or other patient management decisions. A negative result may occur with  improper specimen collection/handling, submission of specimen other than nasopharyngeal swab, presence of viral mutation(s) within the areas targeted by this assay, and inadequate number of viral copies(<138 copies/mL). A negative result must be combined with clinical observations, patient history, and epidemiological information. The expected result is Negative.  Fact Sheet for Patients:  EntrepreneurPulse.com.au  Fact Sheet for Healthcare Providers:  IncredibleEmployment.be  This test is no t yet approved or cleared by the [place] FDA and  has been authorized for detection and/or diagnosis of SARS-CoV-2 by FDA under an Emergency Use  Authorization (EUA). This EUA will remain  in effect (meaning this test can be used) for the duration of the COVID-19 declaration under Section 564(b)(1) of the Act, 21 U.S.C.section 360bbb-3(b)(1), unless the authorization is terminated  or revoked sooner.       Influenza A by PCR NEGATIVE NEGATIVE Final   Influenza B by PCR NEGATIVE NEGATIVE Final    Comment: (NOTE) The Xpert Xpress SARS-CoV-2/FLU/RSV plus assay is intended as an aid in the diagnosis of influenza from Nasopharyngeal swab specimens and should not be used as a sole basis for treatment. Nasal washings and aspirates are unacceptable for Xpert Xpress SARS-CoV-2/FLU/RSV testing.  Fact Sheet for Patients: EntrepreneurPulse.com.au  Fact Sheet for Healthcare Providers: IncredibleEmployment.be  This test is not yet approved or cleared by the [place] FDA and has been authorized for detection and/or diagnosis of SARS-CoV-2 by FDA under an Emergency Use Authorization (EUA). This EUA will remain in effect (meaning this test can be used) for the duration of the COVID-19 declaration under Section 564(b)(1) of the Act, 21 U.S.C. section 360bbb-3(b)(1), unless the authorization is terminated or revoked.  Performed at [facility], [address], [place], [place] [address]      Labs:  Basic Metabolic Panel: Recent Labs  Lab 09/13/20 0828  NA 137  K 4.2  CL 108  CO2 21*  GLUCOSE 96  BUN 13  CREATININE 0.68  CALCIUM 9.1  MG 2.1   Liver Function Tests: Recent Labs  Lab 09/13/20 0828  AST 32  ALT 23  ALKPHOS 68  BILITOT 1.6*  PROT 6.5  ALBUMIN 4.3   CBC: Recent Labs  Lab 09/13/20 0828  WBC 4.0  NEUTROABS 2.9  HGB 15.3  HCT 43.7  MCV 90.5  PLT 99*   CBG: No results for input(s): GLUCAP in the last 168 hours. Hgb A1c No results for input(s): HGBA1C in the last 72 hours. Lipid Profile No results for input(s): CHOL, HDL, LDLCALC, TRIG, CHOLHDL,  LDLDIRECT in the last 72 hours. Thyroid function studies Recent Labs    09/13/20 0828  TSH 1.002   Urinalysis    Component Value Date/Time   COLORURINE STRAW (A) 06/23/2017 1046   APPEARANCEUR CLEAR 06/23/2017 1046   LABSPEC 1.008 06/23/2017 1046   PHURINE 6.0 06/23/2017 1046   GLUCOSEU NEGATIVE 06/23/2017 1046   HGBUR NEGATIVE 06/23/2017 1046   BILIRUBINUR NEGATIVE 06/23/2017 1046   KETONESUR NEGATIVE 06/23/2017 1046   PROTEINUR NEGATIVE 06/23/2017 1046   UROBILINOGEN 0.2 10/28/2007 2316   NITRITE NEGATIVE 06/23/2017 1046   LEUKOCYTESUR NEGATIVE 06/23/2017 1046    FURTHER DISCHARGE INSTRUCTIONS:   Get Medicines reviewed and adjusted: Please take all your medications with you for your next visit with your Primary MD   Laboratory/radiological data: Please request your Primary MD to go over all hospital tests and procedure/radiological results at the follow up, please ask your Primary MD to get all Hospital records sent to his/her office.   In some cases, they will be blood work, cultures and biopsy results pending at the time of your discharge. Please request that your primary care M.D. goes through all the records of your hospital data and follows up on these results.   Also Note the following: If you experience worsening of your admission symptoms, develop shortness of breath, life threatening emergency, suicidal or homicidal thoughts you must seek medical attention immediately by calling 911 or calling your MD immediately  if symptoms less severe.   You must read complete instructions/literature along with all the possible adverse reactions/side effects for all the Medicines you take and that have been prescribed to you. Take any new Medicines after you have completely understood and accpet all the possible adverse reactions/side effects.    Do not drive when taking Pain medications or sleeping medications (Benzodaizepines)   Do not take more than prescribed Pain, Sleep and  Anxiety Medications. It is not advisable to combine anxiety,sleep and pain medications without talking with your primary care practitioner   Special Instructions: If you have smoked or chewed Tobacco  in the last 2 yrs please stop smoking, stop any regular Alcohol  and or any Recreational drug use.   Wear Seat belts while driving.   Please note: You were cared for by a hospitalist during your hospital stay. Once you are discharged, your primary care physician will handle any further medical issues. Please note that NO REFILLS for any discharge medications will be authorized once you are discharged, as it is imperative that you return to your primary care physician (or establish a relationship with a primary care physician if you do not have one) for your post hospital discharge needs so that they can reassess your need for medications and monitor your lab  values.  Time coordinating discharge: 35 minutes  SIGNED:  Marzetta Board, MD, PhD 09/14/2020, 8:56 AM

## 2020-09-14 NOTE — Progress Notes (Addendum)
Progress Note  Patient Name: Todd Frank Date of Encounter: 09/14/2020  [facility] Cardiologist: [name], MD   Subjective   Patient still mildly lightheaded, but reports some of this is chronic. Tele shows SB with heart rates in the 50s. CTA chest showed no PE and stable dilated ascending aorta. Possible d/c today.  Inpatient Medications    Scheduled Meds:  atorvastatin  80 mg Oral q1800   Continuous Infusions:  PRN Meds: acetaminophen **OR** acetaminophen, hydrALAZINE, ondansetron **OR** ondansetron (ZOFRAN) IV   Vital Signs    Vitals:   09/14/20 0530 09/14/20 0600 09/14/20 0630 09/14/20 0700  BP: (!) 152/81 131/89 133/83 128/82  Pulse: 67 (!) 57 (!) 55 (!) 51  Resp: 19 14 15 12   Temp:      TempSrc:      SpO2: 96% 97% 98% 97%  Weight:      Height:       No intake or output data in the 24 hours ending 09/14/20 0832 Last 3 Weights 09/13/2020 08/27/2020 06/04/2020  Weight (lbs) 180 lb 9.6 oz 179 lb 12.8 oz 193 lb 11.2 oz  Weight (kg) 81.92 kg 81.557 kg 87.862 kg      Telemetry    SB, HR 50s - Personally Reviewed  ECG    pending - Personally Reviewed  Physical Exam   GEN: No acute distress.   Neck: No JVD Cardiac:  Bradycardia, RR, + murmurs, rubs, or gallops.  Respiratory: Clear to auscultation bilaterally. GI: Soft, nontender, non-distended  MS: No edema; No deformity. Neuro:  Nonfocal  Psych: Normal affect   Labs    High Sensitivity Troponin:   Recent Labs  Lab 09/13/20 0828 09/13/20 1103  TROPONINIHS 10 10      Chemistry Recent Labs  Lab 09/13/20 0828  NA 137  K 4.2  CL 108  CO2 21*  GLUCOSE 96  BUN 13  CREATININE 0.68  CALCIUM 9.1  PROT 6.5  ALBUMIN 4.3  AST 32  ALT 23  ALKPHOS 68  BILITOT 1.6*  GFRNONAA >60  ANIONGAP 8     Hematology Recent Labs  Lab 09/13/20 0828  WBC 4.0  RBC 4.83  HGB 15.3  HCT 43.7  MCV 90.5  MCH 31.7  MCHC 35.0  RDW 12.9  PLT 99*    BNP Recent Labs  Lab 09/13/20 0828  BNP  31.8     DDimer No results for input(s): DDIMER in the last 168 hours.   Radiology    DG Chest 2 View  Result Date: 09/13/2020 CLINICAL DATA:  Syncope EXAM: CHEST - 2 VIEW COMPARISON:  11/17/2017. FINDINGS: Cardiomediastinal silhouette is within normal limits and similar to prior. Median sternotomy. Linear left basilar opacities appear similar to prior and likely represent. No visible pleural effusions or pneumothorax. Degenerative change of the thoracic spine with flowing anterior osteophytes, suggestive of diffuse idiopathic skeletal hyperostosis. Osteopenia. IMPRESSION: Similar left basilar scar without evidence of acute cardiopulmonary disease. Electronically Signed   By: Margaretha Sheffield MD   On: 09/13/2020 08:54   CT HEAD WO CONTRAST  Result Date: 09/13/2020 CLINICAL DATA:  Syncope.  Diaphoresis EXAM: CT HEAD WITHOUT CONTRAST TECHNIQUE: Contiguous axial images were obtained from the base of the skull through the vertex without intravenous contrast. COMPARISON:  None. FINDINGS: Brain: No acute intracranial hemorrhage. No focal mass lesion. No CT evidence of acute infarction. No midline shift or mass effect. No hydrocephalus. Basilar cisterns are patent. Vascular: No hyperdense vessel or unexpected calcification. Skull: Normal.  Negative for fracture or focal lesion. Sinuses/Orbits: Paranasal sinuses and mastoid air cells are clear. Orbits are clear. Other: None. IMPRESSION: No acute intracranial findings. Electronically Signed   By: Suzy Bouchard M.D.   On: 09/13/2020 20:37   MR BRAIN WO CONTRAST  Result Date: 09/13/2020 CLINICAL DATA:  Initial evaluation for acute syncope. EXAM: MRI HEAD WITHOUT CONTRAST TECHNIQUE: Multiplanar, multiecho pulse sequences of the brain and surrounding structures were obtained without intravenous contrast. COMPARISON:  Prior CT from earlier the same day. FINDINGS: Brain: Cerebral volume within normal limits. Mild chronic microvascular ischemic disease noted  involving the periventricular white matter. Few small remote right cerebellar infarcts noted. No abnormal foci of restricted diffusion to suggest acute or subacute ischemia. Gray-white matter differentiation maintained. No encephalomalacia to suggest chronic cortical infarction. No evidence for acute or chronic intracranial hemorrhage. No mass lesion, midline shift or mass effect. No hydrocephalus or extra-axial fluid collection. Pituitary gland suprasellar region normal. Midline structures intact. Vascular: Major intracranial vascular flow voids are maintained. Skull and upper cervical spine: Craniocervical junction within normal limits. Bone marrow signal intensity normal. No scalp soft tissue abnormality. Sinuses/Orbits: Globes and orbital soft tissues within normal limits. Paranasal sinuses are largely clear. No mastoid effusion. Inner ear structures grossly normal. Other: None. IMPRESSION: 1. No acute intracranial abnormality. 2. Few small remote right cerebellar infarcts. 3. Mild chronic microvascular ischemic disease for age. Electronically Signed   By: Jeannine Boga M.D.   On: 09/13/2020 22:47   CT ANGIO CHEST AORTA W/CM & OR WO/CM  Result Date: 09/13/2020 CLINICAL DATA:  Dizziness. EXAM: CT ANGIOGRAPHY CHEST WITH CONTRAST TECHNIQUE: Multidetector CT imaging of the chest was performed using the standard protocol during bolus administration of intravenous contrast. Multiplanar CT image reconstructions and MIPs were obtained to evaluate the vascular anatomy. CONTRAST:  27mL OMNIPAQUE IOHEXOL 350 MG/ML SOLN COMPARISON:  Chest x-ray from same day. CT chest dated June 19, 2017. FINDINGS: Cardiovascular: Suboptimal opacification of the pulmonary arteries to the segmental level. No central or lobar pulmonary embolism. No definite segmental pulmonary embolism. Unchanged mild cardiomegaly. No pericardial effusion. Stable aneurysmal dilatation of the ascending thoracic aorta measuring up to 4.3 cm. Prior  CABG and AVR. Mediastinum/Nodes: No enlarged mediastinal, hilar, or axillary lymph nodes. Thyroid gland, trachea, and esophagus demonstrate no significant findings. Lungs/Pleura: Lungs are clear. No pleural effusion or pneumothorax. Upper Abdomen: No acute abnormality.  Tiny gallstones. Musculoskeletal: Bilateral gynecomastia. No acute or significant osseous findings. Bilateral glenohumeral osteoarthritis with intra-articular bodies. Review of the MIP images confirms the above findings. IMPRESSION: 1. Suboptimal opacification of the pulmonary arteries. No central or lobar pulmonary embolism. No definite segmental pulmonary embolism. 2. Stable aneurysmal dilatation of the ascending thoracic aorta measuring up to 4.3 cm. Recommend annual imaging followup by CTA or MRA. This recommendation follows 2010 ACCF/AHA/AATS/ACR/ASA/SCA/SCAI/SIR/STS/SVM Guidelines for the Diagnosis and Management of Patients with Thoracic Aortic Disease. Circulation. 2010; 121: Z563-O756. Aortic aneurysm NOS (ICD10-I71.9) 3. Unchanged cholelithiasis. Electronically Signed   By: Titus Dubin M.D.   On: 09/13/2020 13:02   ECHOCARDIOGRAM COMPLETE  Result Date: 09/13/2020    ECHOCARDIOGRAM REPORT   Patient Name:   JAQUILLE KAU Date of Exam: 09/13/2020 Medical Rec #:  [id number]      Height:       69.0 in Accession #:    [id number]     Weight:       180.6 lb Date of Birth:  [birth year]      BSA:  1.978 m Patient Age:    65 years       BP:           139/91 mmHg Patient Gender: M              HR:           52 bpm. Exam Location:  [facility] Procedure: 2D Echo, Color Doppler, Cardiac Doppler and Strain Analysis STAT ECHO Indications:     R55 Syncope  History:         Patient has prior history of Echocardiogram examinations, most                  recent 11/09/2019. CAD, Aortic Valve Disease; Risk Factors:Former                  Smoker, Hypertension and Dyslipidemia.  Sonographer:     Charmayne Sheer RDCS (AE) Referring Phys:  [id number] [place]  Diagnosing Phys: Kathlyn Sacramento MD  Sonographer Comments: Global longitudinal strain was attempted. IMPRESSIONS  1. Left ventricular ejection fraction, by estimation, is 60 to 65%. The left ventricle has normal function. The left ventricle has no regional wall motion abnormalities. There is mild left ventricular hypertrophy. Left ventricular diastolic parameters are consistent with Grade I diastolic dysfunction (impaired relaxation).  2. Right ventricular systolic function is normal. The right ventricular size is normal. Tricuspid regurgitation signal is inadequate for assessing PA pressure.  3. Left atrial size was mildly dilated.  4. Right atrial size was mildly dilated.  5. The mitral valve is normal in structure. Mild mitral valve regurgitation. No evidence of mitral stenosis.  6. The aortic valve has been repaired/replaced. Aortic valve regurgitation is not visualized. No aortic stenosis is present. Echo findings are consistent with normal structure and function of the aortic valve prosthesis. Aortic valve area, by VTI measures 2.11 cm. Aortic valve mean gradient measures 18.0 mmHg.  7. Aortic dilatation noted. There is moderate dilatation of the ascending aorta, measuring 47 mm. Only the proximal portion was visualized. FINDINGS  Left Ventricle: Left ventricular ejection fraction, by estimation, is 60 to 65%. The left ventricle has normal function. The left ventricle has no regional wall motion abnormalities. The left ventricular internal cavity size was normal in size. There is  mild left ventricular hypertrophy. Left ventricular diastolic parameters are consistent with Grade I diastolic dysfunction (impaired relaxation). Right Ventricle: The right ventricular size is normal. No increase in right ventricular wall thickness. Right ventricular systolic function is normal. Tricuspid regurgitation signal is inadequate for assessing PA pressure. Left Atrium: Left atrial size was mildly dilated. Right Atrium:  Right atrial size was mildly dilated. Pericardium: There is no evidence of pericardial effusion. Mitral Valve: The mitral valve is normal in structure. Mild mitral valve regurgitation. No evidence of mitral valve stenosis. MV peak gradient, 4.5 mmHg. The mean mitral valve gradient is 1.0 mmHg. Tricuspid Valve: The tricuspid valve is normal in structure. Tricuspid valve regurgitation is trivial. No evidence of tricuspid stenosis. Aortic Valve: The aortic valve has been repaired/replaced. Aortic valve regurgitation is not visualized. No aortic stenosis is present. Aortic valve mean gradient measures 18.0 mmHg. Aortic valve peak gradient measures 30.2 mmHg. Aortic valve area, by VTI measures 2.11 cm. There is a bovine valve present in the aortic position. Echo findings are consistent with normal structure and function of the aortic valve prosthesis. Pulmonic Valve: The pulmonic valve was normal in structure. Pulmonic valve regurgitation is not visualized. No evidence of pulmonic stenosis. Aorta:  Aortic dilatation noted. There is moderate dilatation of the ascending aorta, measuring 47 mm. Venous: The inferior vena cava was not well visualized. IAS/Shunts: No atrial level shunt detected by color flow Doppler.  LEFT VENTRICLE PLAX 2D LVIDd:         4.70 cm  Diastology LVIDs:         2.90 cm  LV e' medial:    4.24 cm/s LV PW:         1.60 cm  LV E/e' medial:  18.4 LV IVS:        1.10 cm  LV e' lateral:   9.46 cm/s LVOT diam:     1.90 cm  LV E/e' lateral: 8.2 LV SV:         125 LV SV Index:   63       2D Longitudinal Strain LVOT Area:     2.84 cm 2D Strain GLS Avg:     -16.3 %  RIGHT VENTRICLE RV Basal diam:  4.10 cm LEFT ATRIUM             Index       RIGHT ATRIUM           Index LA diam:        4.40 cm 2.22 cm/m  RA Area:     20.20 cm LA Vol (A2C):   31.3 ml 15.82 ml/m RA Volume:   57.30 ml  28.96 ml/m LA Vol (A4C):   54.5 ml 27.55 ml/m LA Biplane Vol: 45.6 ml 23.05 ml/m  AORTIC VALVE                    PULMONIC  VALVE AV Area (Vmax):    1.75 cm     PV Vmax:       1.15 m/s AV Area (Vmean):   1.73 cm     PV Vmean:      84.000 cm/s AV Area (VTI):     2.11 cm     PV VTI:        0.182 m AV Vmax:           275.00 cm/s  PV Peak grad:  5.3 mmHg AV Vmean:          203.000 cm/s PV Mean grad:  3.0 mmHg AV VTI:            0.593 m AV Peak Grad:      30.2 mmHg AV Mean Grad:      18.0 mmHg LVOT Vmax:         170.00 cm/s LVOT Vmean:        124.000 cm/s LVOT VTI:          0.442 m LVOT/AV VTI ratio: 0.75  AORTA Ao Root diam: 2.50 cm MITRAL VALVE MV Area (PHT): 2.51 cm    SHUNTS MV Area VTI:   3.40 cm    Systemic VTI:  0.44 m MV Peak grad:  4.5 mmHg    Systemic Diam: 1.90 cm MV Mean grad:  1.0 mmHg MV Vmax:       1.06 m/s MV Vmean:      55.9 cm/s MV Decel Time: 302 msec MV E velocity: 78.00 cm/s MV A velocity: 96.40 cm/s MV E/A ratio:  0.81 Kathlyn Sacramento MD Electronically signed by Kathlyn Sacramento MD Signature Date/Time: 09/13/2020/12:05:04 PM    Final     Cardiac Studies   Echo 09/2020  1. Left ventricular ejection fraction, by estimation, is 60 to 65%.  The  left ventricle has normal function. The left ventricle has no regional  wall motion abnormalities. There is mild left ventricular hypertrophy.  Left ventricular diastolic parameters  are consistent with Grade I diastolic dysfunction (impaired relaxation).   2. Right ventricular systolic function is normal. The right ventricular  size is normal. Tricuspid regurgitation signal is inadequate for assessing  PA pressure.   3. Left atrial size was mildly dilated.   4. Right atrial size was mildly dilated.   5. The mitral valve is normal in structure. Mild mitral valve  regurgitation. No evidence of mitral stenosis.   6. The aortic valve has been repaired/replaced. Aortic valve  regurgitation is not visualized. No aortic stenosis is present. Echo  findings are consistent with normal structure and function of the aortic  valve prosthesis. Aortic valve area, by VTI   measures 2.11 cm. Aortic valve mean gradient measures 18.0 mmHg.   7. Aortic dilatation noted. There is moderate dilatation of the ascending  aorta, measuring 47 mm. Only the proximal portion was visualized.   Echo 11/2018 1. Left ventricular ejection fraction, by estimation, is 55 to 60%. The  left ventricle has normal function. The left ventricle has no regional  wall motion abnormalities. There is mild left ventricular hypertrophy.   2. Right ventricular systolic function is normal. The right ventricular  size is normal.   3. The mitral valve is normal in structure. Trivial mitral valve  regurgitation.   4. The aortic valve has been repaired/replaced. Aortic valve  regurgitation is not visualized. There is a 23 mm Edwards pericardial  valve present in the aortic position. Echo findings are consistent with  normal structure and function of the aortic valve  prosthesis.   5. Aortic dilatation noted. There is mild dilatation of the ascending  aorta measuring 42 mm.   Lexiscan Myoview 08/2019   The left ventricular ejection fraction is mildly decreased (45-54%). Nuclear stress EF: 49%. There was no ST segment deviation noted during stress. Defect 1: There is a medium defect of severe severity present in the basal inferior and mid inferior location. This is a low risk study. Findings consistent with prior myocardial infarction.   MildAbnormal, low risk stress nuclear study with prior inferior infarct but no significant ischemia.  Gated ejection fraction 49% with hypokinesis of the inferior basal wall.  Left ventricular enlargement.   [place] 05/2017 Conclusions: Significant 2-vessel coronary artery disease involving the LAD and distal RCA/PDA, not significantly changed since prior catheterization in 02/2016. Normal left heart, right heart, and pulmonary artery pressures. Normal Fick cardiac output/index. Heavily calcified aortic valve with limited mobility on fluoroscopic images.    Recommendations: Proceed with outpatient cardiac surgery consultation in the setting of severe aortic stenosis and multivessel coronary artery disease. Continue aggressive secondary prevention.   Nelva Bush, MD [facility] Pager: [phone]   R/L heart cath 02/2016   The left ventricular ejection fraction is 55-65% by visual estimate. There is no mitral valve regurgitation. The left ventricular systolic function is normal. There is moderate aortic valve stenosis. Dist RCA lesion, 100 %stenosed. Prox RCA to Dist RCA lesion, 40 %stenosed. Acute Mrg lesion, 80 %stenosed. 3rd Mrg lesion, 60 %stenosed. Mid LAD to Dist LAD lesion, 90 %stenosed. 1st Diag lesion, 60 %stenosed. Ost 1st Diag lesion, 50 %stenosed. Mid LAD lesion, 60 %stenosed. LV end diastolic pressure is normal. Hemodynamic findings consistent with pulmonary hypertension and aortic valve stenosis.   Total occlusion of the distal RCA supplied by left circumflex  collaterals. 70% stenosis in the PDA on the mid inferior wall Segmentally severe, diffuse disease in the mid to distal LAD. LAD territory is relatively small compared to typical. The LAD does not reach the left ventricular apex. Moderate first diagonal, and circumflex disease. Mild to moderate aortic stenosis. Findings are similar to those obtained at echo. The valve is heavily calcified with restricted motion. Normal pulmonary artery pressures. Normal to low pulmonary capillary wedge. Normal left ventricular function with EF 60%.   RECOMMENDATIONS: Aggressive risk factor modification for CAD including LDL less than 70. With the diffuse nature of the disease in LAD I would recommend medical therapy rather than intervention. Should he become symptomatic this area could be treated but would essentially be a full metal jacket from the distal LAD back to the mid segment and prevent future surgical revascularization. Initiate anti-ischemic therapy in the form  of beta blocker and perhaps long-acting nitrates. Given the findings, I think he could be cleared for his upcoming surgery. He will be an intermediate risk.  Patient Profile     65 y.o. male with a hx of HTN, dyslipidemia, diastolic dysfunction, dilated aorta, OSA on CPAP, prior small PFO, CAD status post CABGx2 with AVR (AVR Magna Ease Aortic Valve- not on lifelong a/c) in 2019 who is being seen 09/13/2020 for the evaluation of syncope.  Assessment & Plan    Syncope - presented with LOC while sitting, mainly felt lightheaded followed by severe diaphoresis. No CP, SOB, or palpitations. Reported heart rate into the 40s with elevated BP. - H/o syncope in 2019 and he ultimately ended up with CABgx2 and AVR - PTA not on an rate lower medications or antihypertensives - Labs are unremarkable. CXR non-actue - Tele shows SB with heart rates 50s, upper 40s, no high grade AV block noted - Orthostatics negative - Echo showed normal EF with stable valve - MRI head unremarkable - Plan for d/c with 2 weel Zio monitor and follow-up with cardiology - Driving restrictions discussed.    Bicuspid AV with severe AS s/p AVR in 2019 - s/p AVR with a 77mm Edwards magna ease AVR 06/2017 at time of CABG - Echo 11/2018 showed stable AVR with mean valve gradient unchanged at 34. - echo this admission showed normal EF and normal function and structure of the valve, mean gradient 35mmHg - continue ASA   CAD s/p CABGx2 in 2019 - s/p CABG 2x with LIMA to LAD, SVG to dPDA - Lexiscan in 2021 without ischemia - patient denies anginal symptoms, he is very active - continue ASA and statin   HTN - PTA not on antihypertensives - moderately elevated on presentation, now wnl   Dilated ascending aorta - stable at 71mm by echo 07/2017 and 13mm 8//2020 echo showed moderate dilation of the ascending aorta, measuring 59mm - CTA showed stable aneurysm of ascending thoracic aorta measuring 4.3cm, recommend annual imaging    HLD - LDL 39 Nov 2021 - continue Atorvastatin   OSA -patient reports compliance with CPAP   For questions or updates, please contact [facility] Please consult [email or web address] for contact info under        Signed, Vernell Townley Ninfa Meeker, PA-C  09/14/2020, 8:32 AM

## 2020-09-15 DIAGNOSIS — R55 Syncope and collapse: Secondary | ICD-10-CM | POA: Diagnosis not present

## 2020-09-25 ENCOUNTER — Other Ambulatory Visit: Payer: Self-pay

## 2020-09-25 ENCOUNTER — Ambulatory Visit (INDEPENDENT_AMBULATORY_CARE_PROVIDER_SITE_OTHER): Payer: Medicare PPO | Admitting: Dermatology

## 2020-09-25 DIAGNOSIS — L988 Other specified disorders of the skin and subcutaneous tissue: Secondary | ICD-10-CM | POA: Diagnosis not present

## 2020-09-25 DIAGNOSIS — D235 Other benign neoplasm of skin of trunk: Secondary | ICD-10-CM | POA: Diagnosis not present

## 2020-09-25 DIAGNOSIS — D1723 Benign lipomatous neoplasm of skin and subcutaneous tissue of right leg: Secondary | ICD-10-CM

## 2020-09-25 DIAGNOSIS — D485 Neoplasm of uncertain behavior of skin: Secondary | ICD-10-CM

## 2020-09-25 DIAGNOSIS — D239 Other benign neoplasm of skin, unspecified: Secondary | ICD-10-CM

## 2020-09-25 MED ORDER — MUPIROCIN 2 % EX OINT: 1.0000 "application " | TOPICAL_OINTMENT | Freq: Every day | CUTANEOUS | 0 refills | Status: DC

## 2020-09-25 NOTE — Patient Instructions (Signed)

## 2020-09-25 NOTE — Progress Notes (Signed)
Follow-Up Visit   Subjective  Todd Frank is a 65 y.o. male who presents for the following: severely dysplastic nevus (Of the left mid back infrascapular - patient is here today for surgical excision).  The following portions of the chart were reviewed this encounter and updated as appropriate:   Tobacco  Allergies  Meds  Problems  Med Hx  Surg Hx  Fam Hx      Review of Systems:  No other skin or systemic complaints except as noted in HPI or Assessment and Plan.  Objective  Well appearing patient in no apparent distress; mood and affect are within normal limits.  A focused examination was performed including the back. Relevant physical exam findings are noted in the Assessment and Plan.  left mid back infrascapular Healing bx site  Levft post deltoid near tricep Dark brown macule  Right thigh Irregular brown macule   Assessment & Plan  Dysplastic nevus left mid back infrascapular  Skin excision - left mid back infrascapular  Lesion length (cm):  1.2 Lesion width (cm):  0.6 Margin per side (cm):  0.2 Total excision diameter (cm):  1.6 Informed consent: discussed and consent obtained   Timeout: patient name, date of birth, surgical site, and procedure verified   Procedure prep:  Patient was prepped and draped in usual sterile fashion Prep type:  Isopropyl alcohol and povidone-iodine Anesthesia: the lesion was anesthetized in a standard fashion   Anesthetic:  1% lidocaine w/ epinephrine 1-100,000 buffered w/ 8.4% NaHCO3 Hemostasis achieved with: pressure   Hemostasis achieved with comment:  Electrocautery Outcome: patient tolerated procedure well with no complications   Post-procedure details: sterile dressing applied and wound care instructions given   Dressing type: bandage and pressure dressing    Skin repair - left mid back infrascapular Complexity:  Complex Final length (cm):  4 Informed consent: discussed and consent obtained   Timeout: patient  name, date of birth, surgical site, and procedure verified   Procedure prep:  Patient was prepped and draped in usual sterile fashion Prep type:  Povidone-iodine Anesthesia: the lesion was anesthetized in a standard fashion   Reason for type of repair: reduce tension to allow closure, reduce the risk of dehiscence, infection, and necrosis, reduce subcutaneous dead space and avoid a hematoma, allow closure of the large defect, preserve normal anatomy, preserve normal anatomical and functional relationships and enhance both functionality and cosmetic results   Undermining: area extensively undermined   Undermining comment:  Undermining - 1.2 cm Subcutaneous layers (deep stitches):  Suture size:  2-0 Suture type: Vicryl (polyglactin 910)   Subcutaneous suture technique: inverted dermal. Fine/surface layer approximation (top stitches):  Suture size:  3-0 Suture type: nylon   Stitches: simple running   Suture removal (days):  7 Hemostasis achieved with: suture and pressure Outcome: patient tolerated procedure well with no complications   Post-procedure details: sterile dressing applied and wound care instructions given   Dressing type: bandage and pressure dressing    mupirocin ointment (BACTROBAN) 2 % - left mid back infrascapular Apply 1 application topically daily. With dressing changes  Specimen 1 - Surgical pathology Differential Diagnosis: Biopsy proven severe dysplastic nevus Check Margins: Yes  Neoplasm of uncertain behavior of skin (2) Levft post deltoid near tricep  Right thigh  Will plan shave removal/biopsy x 2 at post op next week  Lipoma of right lower extremity Right Thigh - Anterior  Lipoma vs cyst  - excision is scheduled for next week.  Return in about  1 week (around 10/02/2020) for suture removal.  I, Ashok Cordia, CMA, am acting as scribe for Sarina Ser, MD . Documentation: I have reviewed the above documentation for accuracy and completeness, and I agree  with the above.  Sarina Ser, MD

## 2020-09-26 ENCOUNTER — Telehealth: Payer: Self-pay

## 2020-09-26 NOTE — Telephone Encounter (Signed)
Spoke with patient regarding surgery. He is doing fine/hd  

## 2020-09-28 ENCOUNTER — Encounter: Payer: Self-pay | Admitting: Dermatology

## 2020-10-02 ENCOUNTER — Encounter: Payer: Self-pay | Admitting: Dermatology

## 2020-10-02 ENCOUNTER — Other Ambulatory Visit: Payer: Self-pay

## 2020-10-02 ENCOUNTER — Ambulatory Visit: Payer: Medicare PPO | Admitting: Dermatology

## 2020-10-02 DIAGNOSIS — L821 Other seborrheic keratosis: Secondary | ICD-10-CM

## 2020-10-02 DIAGNOSIS — D239 Other benign neoplasm of skin, unspecified: Secondary | ICD-10-CM

## 2020-10-02 DIAGNOSIS — D225 Melanocytic nevi of trunk: Secondary | ICD-10-CM

## 2020-10-02 DIAGNOSIS — D492 Neoplasm of unspecified behavior of bone, soft tissue, and skin: Secondary | ICD-10-CM

## 2020-10-02 DIAGNOSIS — D2271 Melanocytic nevi of right lower limb, including hip: Secondary | ICD-10-CM

## 2020-10-02 DIAGNOSIS — D2262 Melanocytic nevi of left upper limb, including shoulder: Secondary | ICD-10-CM | POA: Diagnosis not present

## 2020-10-02 DIAGNOSIS — D1723 Benign lipomatous neoplasm of skin and subcutaneous tissue of right leg: Secondary | ICD-10-CM

## 2020-10-02 DIAGNOSIS — D2371 Other benign neoplasm of skin of right lower limb, including hip: Secondary | ICD-10-CM | POA: Diagnosis not present

## 2020-10-02 NOTE — Patient Instructions (Signed)
If you have any questions or concerns for your doctor, please call our main line at [phone] and press option 4 to reach your doctor's medical assistant. If no one answers, please leave a voicemail as directed and we will return your call as soon as possible. Messages left after 4 pm will be answered the following business day.   You may also send us a message via MyChart. We typically respond to MyChart messages within 1-2 business days.  For prescription refills, please ask your pharmacy to contact our office. Our fax number is [phone].  If you have an urgent issue when the clinic is closed that cannot wait until the next business day, you can page your doctor at the number below.    Please note that while we do our best to be available for urgent issues outside of office hours, we are not available 24/7.   If you have an urgent issue and are unable to reach us, you may choose to seek medical care at your doctor's office, retail clinic, urgent care center, or emergency room.  If you have a medical emergency, please immediately call 911 or go to the emergency department.  Pager Numbers  - Dr. Kowalski: [phone]  - Dr. Moye: [phone]  - Dr. Stewart: [phone]  In the event of inclement weather, please call our main line at [phone] for an update on the status of any delays or closures.  Dermatology Medication Tips: Please keep the boxes that topical medications come in in order to help keep track of the instructions about where and how to use these. Pharmacies typically print the medication instructions only on the boxes and not directly on the medication tubes.   If your medication is too expensive, please contact our office at [phone] option 4 or send us a message through MyChart.   We are unable to tell what your co-pay for medications will be in advance as this is different depending on your insurance coverage. However, we may be able to find a substitute  medication at lower cost or fill out paperwork to get insurance to cover a needed medication.   If a prior authorization is required to get your medication covered by your insurance company, please allow us 1-2 business days to complete this process.  Drug prices often vary depending on where the prescription is filled and some pharmacies may offer cheaper prices.  The website www.goodrx.com contains coupons for medications through different pharmacies. The prices here do not account for what the cost may be with help from insurance (it may be cheaper with your insurance), but the website can give you the price if you did not use any insurance.  - You can print the associated coupon and take it with your prescription to the pharmacy.  - You may also stop by our office during regular business hours and pick up a GoodRx coupon card.  - If you need your prescription sent electronically to a different pharmacy, notify our office through [facility] MyChart or by phone at [phone] option 4.   Wound Care Instructions  Cleanse wound gently with soap and water once a day then pat dry with clean gauze. Apply a thing coat of Petrolatum (petroleum jelly, "Vaseline") over the wound (unless you have an allergy to this). We recommend that you use a new, sterile tube of Vaseline. Do not pick or remove scabs. Do not remove the yellow or white "healing tissue" from the base of the wound.  Cover the   wound with fresh, clean, nonstick gauze and secure with paper tape. You may use Band-Aids in place of gauze and tape if the would is small enough, but would recommend trimming much of the tape off as there is often too much. Sometimes Band-Aids can irritate the skin.  You should call the office for your biopsy report after 1 week if you have not already been contacted.  If you experience any problems, such as abnormal amounts of bleeding, swelling, significant bruising, significant pain, or evidence of infection,  please call the office immediately.  FOR ADULT SURGERY PATIENTS: If you need something for pain relief you may take 1 extra strength Tylenol (acetaminophen) AND 2 Ibuprofen (200mg each) together every 4 hours as needed for pain. (do not take these if you are allergic to them or if you have a reason you should not take them.) Typically, you may only need pain medication for 1 to 3 days.    

## 2020-10-02 NOTE — Progress Notes (Signed)
Follow-Up Visit   Subjective  Todd Frank is a 65 y.o. male who presents for the following: Lipoma (R ant thigh, pt presents for excision), Dysplastic nevus margins free bx proven (L mid back infrascapular, pt presents for suture removal), and Irregular nevi (R thigh, L post deltoid near tricep).  The following portions of the chart were reviewed this encounter and updated as appropriate:   Tobacco  Allergies  Meds  Problems  Med Hx  Surg Hx  Fam Hx      Review of Systems:  No other skin or systemic complaints except as noted in HPI or Assessment and Plan.  Objective  Well appearing patient in no apparent distress; mood and affect are within normal limits.  A focused examination was performed including legs, back, arms. Relevant physical exam findings are noted in the Assessment and Plan.  Right Thigh - Anterior Rubbery nodule 2.1cm  Left mid back infrascapular Healing excision site  L post deltoid near tricep Dark brown macule 0.4cm  R ant lat proxomal thigh Irregular brown macule 0.4cm   Assessment & Plan  Lipoma of right lower extremity Right Thigh - Anterior  Lipoma vs cyst vs pilomatrixoma  Skin excision - Right Thigh - Anterior  Lesion length (cm):  2.1 Lesion width (cm):  2.1 Margin per side (cm):  0 Total excision diameter (cm):  2.1 Informed consent: discussed and consent obtained   Timeout: patient name, date of birth, surgical site, and procedure verified   Procedure prep:  Patient was prepped and draped in usual sterile fashion Prep type:  Isopropyl alcohol and povidone-iodine Anesthesia: the lesion was anesthetized in a standard fashion   Anesthetic:  1% lidocaine w/ epinephrine 1-100,000 buffered w/ 8.4% NaHCO3 (6cc) Instrument used: #15 blade   Hemostasis achieved with: pressure   Hemostasis achieved with comment:  Electrocautery Outcome: patient tolerated procedure well with no complications   Post-procedure details: sterile dressing  applied and wound care instructions given   Dressing type: bandage and pressure dressing (Mupirocin)    Skin repair - Right Thigh - Anterior Complexity:  Complex Final length (cm):  2.5 Reason for type of repair: reduce tension to allow closure, reduce the risk of dehiscence, infection, and necrosis, reduce subcutaneous dead space and avoid a hematoma, allow closure of the large defect, preserve normal anatomy, preserve normal anatomical and functional relationships and enhance both functionality and cosmetic results   Undermining: area extensively undermined   Undermining comment:  Undermining Defect 2.1cm Subcutaneous layers (deep stitches):  Suture size:  2-0 Suture type: Vicryl (polyglactin 910)   Subcutaneous suture technique: Inverted Dermal. Fine/surface layer approximation (top stitches):  Suture size:  3-0 Suture type: nylon   Stitches: horizontal mattress   Suture removal (days):  7 Hemostasis achieved with: pressure Outcome: patient tolerated procedure well with no complications   Post-procedure details: sterile dressing applied and wound care instructions given   Dressing type: bandage, pressure dressing and bacitracin (Mupirocin)    Specimen 3 - Surgical pathology Differential Diagnosis: D48.5 Lipoma vs Cyst vs Pilomatrixoma  Check Margins: No Rubbery nodule 2.1  Dysplastic nevus Left mid back infrascapular  Margins free, bx proven  Encounter for Removal of Sutures - Incision site at the L mid back infrascapular is clean, dry and intact - Wound cleansed, sutures removed, wound cleansed and steri strips applied.  - Discussed pathology results showing Dysplastic nevus margins free  - Patient advised to keep steri-strips dry until they fall off. - Scars remodel for a  full year. - Once steri-strips fall off, patient can apply over-the-counter silicone scar cream each night to help with scar remodeling if desired. - Patient advised to call with any concerns or if they  notice any new or changing lesions.   Related Medications mupirocin ointment (BACTROBAN) 2 % Apply 1 application topically daily. With dressing changes  Neoplasm of skin (2) L post deltoid near tricep  Epidermal / dermal shaving  Lesion diameter (cm):  0.4 Informed consent: discussed and consent obtained   Timeout: patient name, date of birth, surgical site, and procedure verified   Procedure prep:  Patient was prepped and draped in usual sterile fashion Prep type:  Isopropyl alcohol Anesthesia: the lesion was anesthetized in a standard fashion   Anesthetic:  1% lidocaine w/ epinephrine 1-100,000 buffered w/ 8.4% NaHCO3 Instrument used: flexible razor blade   Hemostasis achieved with: pressure, aluminum chloride and electrodesiccation   Outcome: patient tolerated procedure well   Post-procedure details: sterile dressing applied and wound care instructions given   Dressing type: bandage and petrolatum    Specimen 1 - Surgical pathology Differential Diagnosis: D48.5 Nevus vs Dysplastic Nevus  Check Margins: yes Dark brown macule 0.4cm  R ant lat proxomal thigh  Epidermal / dermal shaving  Lesion diameter (cm):  0.4 Informed consent: discussed and consent obtained   Timeout: patient name, date of birth, surgical site, and procedure verified   Procedure prep:  Patient was prepped and draped in usual sterile fashion Prep type:  Isopropyl alcohol Anesthesia: the lesion was anesthetized in a standard fashion   Anesthetic:  1% lidocaine w/ epinephrine 1-100,000 buffered w/ 8.4% NaHCO3 Instrument used: flexible razor blade   Hemostasis achieved with: pressure, aluminum chloride and electrodesiccation   Outcome: patient tolerated procedure well   Post-procedure details: sterile dressing applied and wound care instructions given   Dressing type: bandage and petrolatum    Specimen 2 - Surgical pathology Differential Diagnosis: D48.5 Nevus vs Dysplastic Nevus  Check Margins:  yes Irregular brown macule 0.4cm  Return in about 1 week (around 10/09/2020) for suture removal.  I, Othelia Pulling, RMA, am acting as scribe for Sarina Ser, MD . Documentation: I have reviewed the above documentation for accuracy and completeness, and I agree with the above.  Sarina Ser, MD

## 2020-10-03 ENCOUNTER — Telehealth: Payer: Self-pay

## 2020-10-03 NOTE — Telephone Encounter (Signed)
Patient doing fine after yesterdays surgery./sh 

## 2020-10-03 NOTE — Addendum Note (Signed)
Encounter addended by: Janan Ridge, CMA on: 10/03/2020 10:14 AM  Actions taken: Imaging Exam ended

## 2020-10-05 ENCOUNTER — Telehealth: Payer: Self-pay | Admitting: *Deleted

## 2020-10-05 DIAGNOSIS — R55 Syncope and collapse: Secondary | ICD-10-CM

## 2020-10-05 DIAGNOSIS — I471 Supraventricular tachycardia: Secondary | ICD-10-CM

## 2020-10-05 NOTE — Telephone Encounter (Signed)
[name], MD  You; Antonieta Iba, RN 5 hours ago (11:19 AM)   Patient's heart monitor showed no significant slow heart beats.  He did have some extra beats from the top of his heart that are called atrial tachycardia.  I would like him referred to Dr. Quentin Ore with EP to evaluate syncope further.  Please find out if he has had any palpitations.     You routed conversation to [name], MD; Antonieta Iba, RN 7 hours ago (9:46 AM)   Loann Quill M "Ronalee Belts"  [name], MD 7 hours ago (9:43 AM)    Good morning [name].  I experienced a brief syncope episode on the morning of 06/09.  I went to the ER at [facility], was kept overnight and fitted with a heart monitor which I wore for two weeks.  I returned the monitor last Friday and the results are now on MyChart.  I'd like to schedule some time to discuss all of this with you.   Thank you,   Peggyann Shoals  Spoke with patient regarding Dr Theodosia Blender comments and orders to be evaluated by Dr Quentin Ore.  He is in agreement and is aware he will be contacted to be scheduled.  When asked about any current s/s he reports he occasionally has PVCs but has had "for a long time."  Reports he has c/o some lightheadedness for years but nothing currently.  He does have a cardiomobile device and will be using it more frequently when he has s/s.

## 2020-10-09 ENCOUNTER — Other Ambulatory Visit: Payer: Self-pay

## 2020-10-09 ENCOUNTER — Ambulatory Visit: Payer: Medicare PPO

## 2020-10-09 DIAGNOSIS — Z4802 Encounter for removal of sutures: Secondary | ICD-10-CM

## 2020-10-09 NOTE — Progress Notes (Signed)
   Follow-Up Visit   Subjective  Todd Frank is a 65 y.o. male who presents for the following: Suture / Staple Removal (Suture removal for excision on right anterior thigh. Path pending).    The following portions of the chart were reviewed this encounter and updated as appropriate:         Objective  Well appearing patient in no apparent distress; mood and affect are within normal limits.    Right Thigh - Anterior Incision site is clean, dry and intact    Assessment & Plan  Encounter for removal of sutures Right Thigh - Anterior  Encounter for Removal of Sutures - Incision site at the right anterior thigh is clean, dry and intact - Wound cleansed, sutures removed, wound cleansed and steri strips applied.  - Discussed pathology results showing pending  - Patient advised to keep steri-strips dry until they fall off. - Scars remodel for a full year. - Once steri-strips fall off, patient can apply over-the-counter silicone scar cream each night to help with scar remodeling if desired. - Patient advised to call with any concerns or if they notice any new or changing lesions.    No follow-ups on file.  I, Harriett Sine, CMA, am acting as scribe for [employer], CMA.

## 2020-10-09 NOTE — Telephone Encounter (Signed)
Pt has been scheduled to be seen by Dr Quentin Ore 11/07/2020 at 3:20 pm.

## 2020-10-09 NOTE — Patient Instructions (Signed)

## 2020-10-13 ENCOUNTER — Encounter: Payer: Self-pay | Admitting: Dermatology

## 2020-10-16 ENCOUNTER — Telehealth: Payer: Self-pay

## 2020-10-16 NOTE — Telephone Encounter (Signed)
Discussed biopsy results with pt will schedule pt for Monday August 15 at 8:30 for [facility]

## 2020-11-06 ENCOUNTER — Other Ambulatory Visit: Payer: Medicare PPO

## 2020-11-07 ENCOUNTER — Ambulatory Visit: Payer: Medicare PPO | Admitting: Cardiology

## 2020-11-07 ENCOUNTER — Other Ambulatory Visit: Payer: Self-pay

## 2020-11-07 ENCOUNTER — Encounter: Payer: Self-pay | Admitting: Cardiology

## 2020-11-07 VITALS — BP 140/90 | HR 60 | Ht 69.0 in | Wt 176.0 lb

## 2020-11-07 DIAGNOSIS — R55 Syncope and collapse: Secondary | ICD-10-CM

## 2020-11-07 DIAGNOSIS — Z953 Presence of xenogenic heart valve: Secondary | ICD-10-CM

## 2020-11-07 NOTE — Patient Instructions (Signed)
Medication Instructions:  Your physician recommends that you continue on your current medications as directed. Please refer to the Current Medication list given to you today. *If you need a refill on your cardiac medications before your next appointment, please call your pharmacy*  Lab Work: None ordered. If you have labs (blood work) drawn today and your tests are completely normal, you will receive your results only by: [facility] (if you have MyChart) OR A paper copy in the mail If you have any lab test that is abnormal or we need to change your treatment, we will call you to review the results.  Testing/Procedures: None ordered.  Follow-Up: At [facility], you and your health needs are our priority.  As part of our continuing mission to provide you with exceptional heart care, we have created designated Provider Care Teams.  These Care Teams include your primary Cardiologist (physician) and Advanced Practice Providers (APPs -  Physician Assistants and Nurse Practitioners) who all work together to provide you with the care you need, when you need it.  Your next appointment:   Your physician wants you to follow-up in: next available with Vickie Epley, MD FOR [facility].  Implantable Loop Recorder Placement  An implantable loop recorder is a small electronic device that is placed under the skin of your chest. The device records the electrical activity of your heart over a long period of time. Your health care provider can download theserecordings to monitor your heart. You may need an implantable loop recorder if you have periods of abnormal heart activity (arrhythmias) or unexplained fainting (syncope). The recorder can be left in place for 1 year or longer.

## 2020-11-07 NOTE — Progress Notes (Signed)
Electrophysiology Office Note:    Date:  11/07/2020   ID:  Todd Frank, Todd Frank 04/04/1956, MRN [id number]  PCP:  Olin Hauser, DO  [facility] Cardiologist:  [name], MD  [facility] Electrophysiologist:  Vickie Epley, MD   Referring MD: [name], MD   Chief Complaint: Syncope  History of Present Illness:    Todd Frank is a 65 y.o. male who presents for an evaluation of syncope at the request of [name]. Their medical history includes bicuspid aortic valve and a sending aortic dilation post bioprosthetic aortic valve replacement in March 2019.  He tells me that for years he is struggled with spells of lightheadedness and dizziness.  On June 9 he had an episode of syncope.  He was using his iPad when he suddenly felt diaphoretic and hot all over and then passed out completely.  He dropped the iPad and slumped over to his side. he woke up after what seems like a few seconds but he is unclear of the exact timeline.  He called his wife over and they called 911 and went to the hospital.  In the emergency department no abnormalities were noted.  He did wear a heart monitor after this event which did not show any malignant arrhythmias or conduction disease.  He did not have any recurrent syncopal episodes or near syncopal episodes while wearing the heart monitor.  He has not driven since the syncopal episode.  He exercises daily with at least 1 hour to 90 minutes of aerobic exercise.  Past Medical History:  Diagnosis Date   Aortic stenosis    a. syncope/severe AS s/p bioprosthetic AVR 06/2017.   Arthritis    Ascending aorta dilatation (HCC)    46m by echo 11/2019   Bicuspid aortic valve    Bradycardia    secondary to BB   Broken clavicle    as child, can still dislocate at times   CAD (coronary artery disease), native coronary artery    a. s/p 2V CABG at time of AVR 06/2017.   Carotid bruit    carotid dopplers negative.  Bruit due to heart murmur from  AS   Congenital nevus    Diastolic dysfunction    Dysplastic nevus 12/12/2008   Left mid back, 9cm lat. to spine. Moderate atypia, extends to one edge.    Dysplastic nevus 12/12/2008   Left mid to low back, 7cm lat. to spine. Moderate atypia, close to margin.    Dysplastic nevus 09/24/2012   Right mid lateral buttocks. Moderate atypia, close to edge.    Dysplastic nevus 08/15/2020   Left mid back infrascapular - excised 09/25/20   Dysplastic nevus 10/02/2020   left post deltoid near tricep, mild atypia   Dysplastic nevus 10/02/2020   right ant lateral prox thigh, severe   GERD (gastroesophageal reflux disease)    Headache    optical migraine   Hx of melanoma in situ 06/26/2009   Left superior thigh. Arising in dysplastic nevus, close to margin. Excised 07/11/2009, margins free.   Hyperlipidemia    LDL goal < 70   LVH (left ventricular hypertrophy)    Melanoma of skin (HCC)    Obesity    PFO (patent foramen ovale)    small by echo 2014, but not demonstrated during intra-op note from CABG/AVR 06/2017.    Past Surgical History:  Procedure Laterality Date   AORTIC VALVE REPLACEMENT N/A 06/25/2017   Procedure: AORTIC VALVE REPLACEMENT (AVR) using Magna  Ease Aortic Valve size 44m;  Surgeon: VIvin Poot MD;  [place]  Service: Open Heart Surgery;  Laterality: N/A;   arthritic cyst removal  2017   from Lumbar 4-5, dr pool   CARDIAC CATHETERIZATION N/A 02/20/2016   Procedure: Right/Left Heart Cath and Coronary Angiography;  Surgeon: HBelva Crome MD;  [place] [facility];  Service: Cardiovascular;  Laterality: N/A;   CORONARY ARTERY BYPASS GRAFT N/A 06/25/2017   Procedure: CORONARY ARTERY BYPASS GRAFTING (CABG) x Two , using left internal mammary artery and right leg greater saphenous vein harvested endoscopically;  Surgeon: VIvin Poot MD;  [place]  Service: Open Heart Surgery;  Laterality: N/A;   CORONARY/GRAFT ANGIOGRAPHY N/A 06/02/2017   Procedure:  CORONARY/GRAFT ANGIOGRAPHY;  Surgeon: ENelva Bush MD;  [place] [facility] [place];  Service: Cardiovascular;  Laterality: N/A;   EYE SURGERY     MELANOMA EXCISION  2011   left thigh   RIGHT HEART CATH N/A 06/02/2017   Procedure: RIGHT HEART CATH;  Surgeon: ENelva Bush MD;  [place] [facility];  Service: Cardiovascular;  Laterality: N/A;   TEE WITHOUT CARDIOVERSION N/A 06/25/2017   Procedure: TRANSESOPHAGEAL ECHOCARDIOGRAM (TEE);  Surgeon: VPrescott Gum PCollier Salina MD;  [place]  Service: Open Heart Surgery;  Laterality: N/A;   TONSILLECTOMY      Current Medications: Current Meds  Medication Sig   aspirin EC 81 MG tablet Take 81 mg by mouth daily.   atorvastatin (LIPITOR) 80 MG tablet Take 1 tablet (80 mg total) by mouth daily.   cholecalciferol (VITAMIN D) 1000 units tablet Take 1,000 Units by mouth every evening.   Coenzyme Q10 (COQ-10) 200 MG CAPS Take 200 mg by mouth daily.   fluticasone (FLONASE) 50 MCG/ACT nasal spray Place 2 sprays into both nostrils daily as needed (for allergies.).   vitamin B-12 (CYANOCOBALAMIN) 1000 MCG tablet Take 1,000 mcg by mouth daily.     Allergies:   Patient has no known allergies.   Social History   Socioeconomic History   Marital status: Married    Spouse name: DHinton Dyer  Number of children: 0   Years of education: Assoc degree   Highest education level: Not on file  Occupational History   Occupation: PGovernment social research officer- [employer]  Tobacco Use   Smoking status: Former    Packs/day: 1.00    Types: Cigarettes    Quit date: 04/07/1976    Years since quitting: 44.6   Smokeless tobacco: Former   Tobacco comments:    quit in 78, teenager  Vaping Use   Vaping Use: Never used  Substance and Sexual Activity   Alcohol use: Yes    Alcohol/week: 1.0 standard drink    Types: 1 Cans of beer per week    Comment: occasional   Drug use: No    Comment: quit marijuana in 1990   Sexual activity: Yes  Other Topics Concern   Not on  file  Social History Narrative   Lives with wife   Caffeine < 1 cup daily   Social Determinants of Health   Financial Resource Strain: Not on file  Food Insecurity: Not on file  Transportation Needs: Not on file  Physical Activity: Not on file  Stress: Not on file  Social Connections: Not on file     Family History: The patient's family history includes Arthritis in his father and sister; Diabetes in his mother; Heart attack in an other family member; Heart disease in  his mother and paternal grandmother; Pancreatic cancer in his maternal aunt.  ROS:   Please see the history of present illness.    All other systems reviewed and are negative.  EKGs/Labs/Other Studies Reviewed:    The following studies were reviewed today:  October 03, 2020 heart monitor personally reviewed No evidence of AV conduction disease No significant bradycardias Nonsustained atrial tach of 15 beats at 150 bpm  EKG:  The ekg ordered today demonstrates sinus rhythm.  PR interval is 204 ms.  Inferior infarct.  Ventricular rate of 60 bpm  Recent Labs: 09/13/2020: ALT 23; B Natriuretic Peptide 31.8; BUN 13; Creatinine, Ser 0.68; Hemoglobin 15.3; Magnesium 2.1; Platelets 99; Potassium 4.2; Sodium 137; TSH 1.002  Recent Lipid Panel    Component Value Date/Time   CHOL 110 02/13/2020 0813   CHOL 115 02/22/2019 0928   CHOL 173 11/02/2015 0000   TRIG 82 02/13/2020 0813   TRIG 69 11/02/2015 0000   HDL 55 02/13/2020 0813   HDL 56 02/22/2019 0928   CHOLHDL 2.0 02/13/2020 0813   VLDL 14 11/02/2015 0000   LDLCALC 39 02/13/2020 0813   LDLCALC 104 (A) 11/02/2015 0000    Physical Exam:    VS:  BP 140/90 (BP Location: Left Arm, Patient Position: Sitting, Cuff Size: Normal)   Pulse 60   Ht '5\' 9"'$  (1.753 m)   Wt 176 lb (79.8 kg)   SpO2 98%   BMI 25.99 kg/m     Wt Readings from Last 3 Encounters:  11/07/20 176 lb (79.8 kg)  09/13/20 180 lb 9.6 oz (81.9 kg)  08/27/20 179 lb 12.8 oz (81.6 kg)     GEN:  Well  nourished, well developed in no acute distress HEENT: Normal NECK: No JVD; No carotid bruits LYMPHATICS: No lymphadenopathy CARDIAC: RRR, 2 out of 6 systolic ejection murmur.  Normal bioprosthetic aortic valve sounds.  No rubs, gallops RESPIRATORY:  Clear to auscultation without rales, wheezing or rhonchi  ABDOMEN: Soft, non-tender, non-distended MUSCULOSKELETAL:  No edema; No deformity  SKIN: Warm and dry NEUROLOGIC:  Alert and oriented x 3 PSYCHIATRIC:  Normal affect   ASSESSMENT:    1. Syncope and collapse   2. S/P aortic valve replacement with bioprosthetic valve    PLAN:    In order of problems listed above:  1. Syncope and collapse He has a concerning history of syncope.  Given his aortic valve disease it raises the question of conduction system fibrosis and disease.  He is worn a heart monitor which was unrevealing.  I think he should proceed with a loop recorder implant for longer-term heart rhythm monitoring.  We discussed that if this is normal the likely diagnosis is vasovagal syncope.  We discussed the pathophysiology of vasovagal syncope in detail during today's visit.  We discussed the limited treatment options including continued aerobic exercise with a focus on recumbent exercises.  We discussed aggressive hydration and beginning the hydration before he gets out of bed in the morning.  We discussed compression stockings, liberalizing salt intake and elevating the head of the bed by 6 inches.  We discussed driving restrictions.  2. S/P aortic valve replacement with bioprosthetic valve Normal bioprosthetic valve exam today.  Euvolemic.        Total time spent with patient today 50 minutes. This includes reviewing records, evaluating the patient and coordinating care.  Medication Adjustments/Labs and Tests Ordered: Current medicines are reviewed at length with the patient today.  Concerns regarding medicines are outlined above.  Orders Placed This Encounter   Procedures   EKG 12-Lead   No orders of the defined types were placed in this encounter.    Signed, Hilton Cork. Quentin Ore, MD, [facility], [facility] 11/07/2020 3:54 PM    Electrophysiology [facility]

## 2020-11-19 ENCOUNTER — Other Ambulatory Visit: Payer: Self-pay

## 2020-11-19 ENCOUNTER — Ambulatory Visit: Payer: Medicare PPO | Admitting: Dermatology

## 2020-11-19 DIAGNOSIS — Z85820 Personal history of malignant melanoma of skin: Secondary | ICD-10-CM

## 2020-11-19 DIAGNOSIS — Z86018 Personal history of other benign neoplasm: Secondary | ICD-10-CM

## 2020-11-19 DIAGNOSIS — L988 Other specified disorders of the skin and subcutaneous tissue: Secondary | ICD-10-CM | POA: Diagnosis not present

## 2020-11-19 DIAGNOSIS — D2371 Other benign neoplasm of skin of right lower limb, including hip: Secondary | ICD-10-CM

## 2020-11-19 DIAGNOSIS — D239 Other benign neoplasm of skin, unspecified: Secondary | ICD-10-CM

## 2020-11-19 NOTE — Progress Notes (Signed)
   Follow-Up Visit   Subjective  Todd Frank is a 65 y.o. male who presents for the following: Procedure (Patient here today for shave removal of bx proven severe dysplastic at right anterior lateral proximal thigh. ).  Patient is already scheduled for TBSE in January 2023.  The following portions of the chart were reviewed this encounter and updated as appropriate:   Tobacco  Allergies  Meds  Problems  Med Hx  Surg Hx  Fam Hx     Review of Systems:  No other skin or systemic complaints except as noted in HPI or Assessment and Plan.  Objective  Well appearing patient in no apparent distress; mood and affect are within normal limits.  A focused examination was performed including right thigh, left arm. Relevant physical exam findings are noted in the Assessment and Plan.  Right anterior lateral proximal thigh Healing biopsy site 0.4cm original   Assessment & Plan  Dysplastic nevus Right anterior lateral proximal thigh Epidermal / dermal shaving - Right anterior lateral proximal thigh  Lesion diameter (cm):  0.8 Informed consent: discussed and consent obtained   Timeout: patient name, date of birth, surgical site, and procedure verified   Procedure prep:  Patient was prepped and draped in usual sterile fashion Prep type:  Isopropyl alcohol Anesthesia: the lesion was anesthetized in a standard fashion   Anesthetic:  1% lidocaine w/ epinephrine 1-100,000 buffered w/ 8.4% NaHCO3 (1cc bupivicaine, 0.5cc lido w/ epi) Instrument used: flexible razor blade   Hemostasis achieved with: pressure, aluminum chloride and electrodesiccation   Outcome: patient tolerated procedure well   Post-procedure details: sterile dressing applied and wound care instructions given   Dressing type: bandage and petrolatum    Specimen 1 - Surgical pathology Differential Diagnosis: r/o residual atypia, biopsy proven dysplastic nevus with moderate to severe atypia  Check Margins:  yes [id number] Healing biopsy site, 0.4cm original  History of Dysplastic Nevi - No evidence of recurrence today - Recommend regular full body skin exams - Recommend daily broad spectrum sunscreen SPF 30+ to sun-exposed areas, reapply every 2 hours as needed.  - Call if any new or changing lesions are noted between office visits  History of Melanoma - No evidence of recurrence today - No lymphadenopathy - Recommend regular full body skin exams - Recommend daily broad spectrum sunscreen SPF 30+ to sun-exposed areas, reapply every 2 hours as needed.  - Call if any new or changing lesions are noted between office visits   Return for TBSE, as scheduled.  Graciella Belton, RMA, am acting as scribe for Sarina Ser, MD . Documentation: I have reviewed the above documentation for accuracy and completeness, and I agree with the above.  Sarina Ser, MD

## 2020-11-19 NOTE — Patient Instructions (Signed)
Wound Care Instructions  Cleanse wound gently with soap and water once a day then pat dry with clean gauze. Apply a thing coat of Petrolatum (petroleum jelly, "Vaseline") over the wound (unless you have an allergy to this). We recommend that you use a new, sterile tube of Vaseline. Do not pick or remove scabs. Do not remove the yellow or white "healing tissue" from the base of the wound.  Cover the wound with fresh, clean, nonstick gauze and secure with paper tape. You may use Band-Aids in place of gauze and tape if the would is small enough, but would recommend trimming much of the tape off as there is often too much. Sometimes Band-Aids can irritate the skin.  You should call the office for your biopsy report after 1 week if you have not already been contacted.  If you experience any problems, such as abnormal amounts of bleeding, swelling, significant bruising, significant pain, or evidence of infection, please call the office immediately.  FOR ADULT SURGERY PATIENTS: If you need something for pain relief you may take 1 extra strength Tylenol (acetaminophen) AND 2 Ibuprofen (200mg each) together every 4 hours as needed for pain. (do not take these if you are allergic to them or if you have a reason you should not take them.) Typically, you may only need pain medication for 1 to 3 days.   If you have any questions or concerns for your doctor, please call our main line at [phone] and press option 4 to reach your doctor's medical assistant. If no one answers, please leave a voicemail as directed and we will return your call as soon as possible. Messages left after 4 pm will be answered the following business day.   You may also send us a message via MyChart. We typically respond to MyChart messages within 1-2 business days.  For prescription refills, please ask your pharmacy to contact our office. Our fax number is [phone].  If you have an urgent issue when the clinic is closed that  cannot wait until the next business day, you can page your doctor at the number below.    Please note that while we do our best to be available for urgent issues outside of office hours, we are not available 24/7.   If you have an urgent issue and are unable to reach us, you may choose to seek medical care at your doctor's office, retail clinic, urgent care center, or emergency room.  If you have a medical emergency, please immediately call 911 or go to the emergency department.  Pager Numbers  - Dr. Kowalski: [phone]  - Dr. Moye: [phone]  - Dr. Stewart: [phone]  In the event of inclement weather, please call our main line at [phone] for an update on the status of any delays or closures.  Dermatology Medication Tips: Please keep the boxes that topical medications come in in order to help keep track of the instructions about where and how to use these. Pharmacies typically print the medication instructions only on the boxes and not directly on the medication tubes.   If your medication is too expensive, please contact our office at [phone] option 4 or send us a message through MyChart.   We are unable to tell what your co-pay for medications will be in advance as this is different depending on your insurance coverage. However, we may be able to find a substitute medication at lower cost or fill out paperwork to get insurance to cover a needed   medication.   If a prior authorization is required to get your medication covered by your insurance company, please allow us 1-2 business days to complete this process.  Drug prices often vary depending on where the prescription is filled and some pharmacies may offer cheaper prices.  The website www.goodrx.com contains coupons for medications through different pharmacies. The prices here do not account for what the cost may be with help from insurance (it may be cheaper with your insurance), but the website can give you the  price if you did not use any insurance.  - You can print the associated coupon and take it with your prescription to the pharmacy.  - You may also stop by our office during regular business hours and pick up a GoodRx coupon card.  - If you need your prescription sent electronically to a different pharmacy, notify our office through [facility] MyChart or by phone at [phone] option 4.   

## 2020-11-21 ENCOUNTER — Encounter: Payer: Self-pay | Admitting: Dermatology

## 2020-11-21 ENCOUNTER — Ambulatory Visit: Payer: Medicare PPO | Admitting: Cardiology

## 2020-11-26 ENCOUNTER — Telehealth: Payer: Self-pay

## 2020-11-26 NOTE — Telephone Encounter (Signed)
-----   Message from Ralene Bathe, MD sent at 11/24/2020  6:49 PM EDT ----- Diagnosis Skin , right anterior lateral proximal thigh RESHAVE, SCAR, NO RESIDUAL DYSPLASTIC NEVUS  No residual dysplastic Recheck next visit

## 2020-11-26 NOTE — Telephone Encounter (Signed)
Advised pt of bx results/sh ?

## 2020-12-05 ENCOUNTER — Other Ambulatory Visit: Payer: Self-pay

## 2020-12-05 ENCOUNTER — Encounter: Payer: Self-pay | Admitting: Cardiology

## 2020-12-05 ENCOUNTER — Ambulatory Visit: Payer: Medicare PPO | Admitting: Cardiology

## 2020-12-05 VITALS — BP 130/80 | HR 62 | Ht 69.5 in | Wt 173.0 lb

## 2020-12-05 DIAGNOSIS — R55 Syncope and collapse: Secondary | ICD-10-CM

## 2020-12-05 DIAGNOSIS — Z953 Presence of xenogenic heart valve: Secondary | ICD-10-CM | POA: Diagnosis not present

## 2020-12-05 NOTE — Patient Instructions (Addendum)
Medication Instructions:  - Your physician recommends that you continue on your current medications as directed. Please refer to the Current Medication list given to you today.  *If you need a refill on your cardiac medications before your next appointment, please call your pharmacy*   Lab Work: - none ordered  If you have labs (blood work) drawn today and your tests are completely normal, you will receive your results only by: [place] (if you have MyChart) OR A paper copy in the mail If you have any lab test that is abnormal or we need to change your treatment, we will call you to review the results.   Testing/Procedures: - none ordered   Follow-Up: At [facility], you and your health needs are our priority.  As part of our continuing mission to provide you with exceptional heart care, we have created designated Provider Care Teams.  These Care Teams include your primary Cardiologist (physician) and Advanced Practice Providers (APPs -  Physician Assistants and Nurse Practitioners) who all work together to provide you with the care you need, when you need it.  We recommend signing up for the patient portal called "MyChart".  Sign up information is provided on this After Visit Summary.  MyChart is used to connect with patients for Virtual Visits (Telemedicine).  Patients are able to view lab/test results, encounter notes, upcoming appointments, etc.  Non-urgent messages can be sent to your provider as well.   To learn more about what you can do with MyChart, go to [email or web address].    Your next appointment:   As needed   The format for your next appointment:   N/a  Provider:   Lars Mage, MD   Other Instructions  Post Loop Recorder implant instructions:  1) In 72 hours you  may remove your tegaderm (top) dressing & shower  2) You may remove your steri strips 1 week post procedure if they have not fallen off on their own  3) If you have any bleeding  issues or concerns with your implant site after getting home, please call our [facility] directly at [phone].

## 2020-12-05 NOTE — Progress Notes (Signed)
Electrophysiology Office Follow up Visit Note:    Date:  12/05/2020   ID:  Todd, Frank October 19, 1955, MRN [id number]  PCP:  Olin Hauser, DO  [facility] Cardiologist:  [name], MD  [facility] Electrophysiologist:  Vickie Epley, MD    Interval History:    Todd Frank is a 65 y.o. male who presents for a follow up visit for syncope.  I last saw the patient November 07, 2020.  At that visit we discussed implanting a loop recorder for monitoring for any arrhythmic cause of his syncope.  Today he is with his wife in clinic who I have previously met.  He tells me he has not had another syncopal episode since I last saw him.  He is interested in proceeding with loop recorder implantation.     Past Medical History:  Diagnosis Date   Aortic stenosis    a. syncope/severe AS s/p bioprosthetic AVR 06/2017.   Arthritis    Ascending aorta dilatation (HCC)    69m by echo 11/2019   Bicuspid aortic valve    Bradycardia    secondary to BB   Broken clavicle    as child, can still dislocate at times   CAD (coronary artery disease), native coronary artery    a. s/p 2V CABG at time of AVR 06/2017.   Carotid bruit    carotid dopplers negative.  Bruit due to heart murmur from AS   Congenital nevus    Diastolic dysfunction    Dysplastic nevus 12/12/2008   Left mid back, 9cm lat. to spine. Moderate atypia, extends to one edge.    Dysplastic nevus 12/12/2008   Left mid to low back, 7cm lat. to spine. Moderate atypia, close to margin.    Dysplastic nevus 09/24/2012   Right mid lateral buttocks. Moderate atypia, close to edge.    Dysplastic nevus 08/15/2020   Left mid back infrascapular - excised 09/25/20   Dysplastic nevus 10/02/2020   left post deltoid near tricep, mild atypia   Dysplastic nevus 10/02/2020   right ant lateral prox thigh, severe, Shave removal 11/19/20   GERD (gastroesophageal reflux disease)    Headache    optical migraine   Hx of melanoma in  situ 06/26/2009   Left superior thigh. Arising in dysplastic nevus, close to margin. Excised 07/11/2009, margins free.   Hyperlipidemia    LDL goal < 70   LVH (left ventricular hypertrophy)    Melanoma of skin (HCC)    Obesity    PFO (patent foramen ovale)    small by echo 2014, but not demonstrated during intra-op note from CABG/AVR 06/2017.    Past Surgical History:  Procedure Laterality Date   AORTIC VALVE REPLACEMENT N/A 06/25/2017   Procedure: AORTIC VALVE REPLACEMENT (AVR) using Magna Ease Aortic Valve size 229m  Surgeon: VaIvin PootMD;  [place] [facility];  Laterality: N/A;   arthritic cyst removal  2017   from Lumbar 4-5, dr pool   CARDIAC CATHETERIZATION N/A 02/20/2016   Procedure: Right/Left Heart Cath and Coronary Angiography;  Surgeon: HeBelva CromeMD;  [place] [facility];  Service: Cardiovascular;  Laterality: N/A;   CORONARY ARTERY BYPASS GRAFT N/A 06/25/2017   Procedure: CORONARY ARTERY BYPASS GRAFTING (CABG) x Two , using left internal mammary artery and right leg greater saphenous vein harvested endoscopically;  Surgeon: VaIvin PootMD;  [place] [facility] [facility] [place];  Laterality: N/A;  CORONARY/GRAFT ANGIOGRAPHY N/A 06/02/2017   Procedure: CORONARY/GRAFT ANGIOGRAPHY;  Surgeon: Nelva Bush, MD;  Location: [place] [facility];  Service: Cardiovascular;  Laterality: N/A;   EYE SURGERY     MELANOMA EXCISION  2011   left thigh   RIGHT HEART CATH N/A 06/02/2017   Procedure: RIGHT HEART CATH;  Surgeon: Nelva Bush, MD;  Location: [place] [facility];  Service: Cardiovascular;  Laterality: N/A;   TEE WITHOUT CARDIOVERSION N/A 06/25/2017   Procedure: TRANSESOPHAGEAL ECHOCARDIOGRAM (TEE);  Surgeon: Prescott Gum, Collier Salina, MD;  Location: [place];  Service: Open Heart Surgery;  Laterality: N/A;   TONSILLECTOMY      Current Medications: Current Meds  Medication Sig   aspirin EC 81 MG tablet Take 81 mg by mouth  daily.   atorvastatin (LIPITOR) 80 MG tablet Take 1 tablet (80 mg total) by mouth daily.   cholecalciferol (VITAMIN D) 1000 units tablet Take 1,000 Units by mouth every evening.   Coenzyme Q10 (COQ-10) 200 MG CAPS Take 200 mg by mouth daily.   fluticasone (FLONASE) 50 MCG/ACT nasal spray Place 2 sprays into both nostrils daily as needed (for allergies.).   vitamin B-12 (CYANOCOBALAMIN) 1000 MCG tablet Take 1,000 mcg by mouth daily.     Allergies:   Patient has no known allergies.   Social History   Socioeconomic History   Marital status: Married    Spouse name: [name]   Number of children: 0   Years of education: Assoc degree   Highest education level: Not on file  Occupational History   Occupation: Government social research officer - [employer]  Tobacco Use   Smoking status: Former    Packs/day: 1.00    Types: Cigarettes    Quit date: 04/07/1976    Years since quitting: 44.6   Smokeless tobacco: Former   Tobacco comments:    quit in 78, teenager  Vaping Use   Vaping Use: Never used  Substance and Sexual Activity   Alcohol use: Yes    Alcohol/week: 1.0 standard drink    Types: 1 Cans of beer per week    Comment: occasional   Drug use: No    Comment: quit marijuana in 1990   Sexual activity: Yes  Other Topics Concern   Not on file  Social History Narrative   Lives with wife   Caffeine < 1 cup daily   Social Determinants of Health   Financial Resource Strain: Not on file  Food Insecurity: Not on file  Transportation Needs: Not on file  Physical Activity: Not on file  Stress: Not on file  Social Connections: Not on file     Family History: The patient's family history includes Arthritis in his father and sister; Diabetes in his mother; Heart attack in an other family member; Heart disease in his mother and paternal grandmother; Pancreatic cancer in his maternal aunt.  ROS:   Please see the history of present illness.    All other systems reviewed and are negative.  EKGs/Labs/Other  Studies Reviewed:    The following studies were reviewed today:   EKG:  The ekg ordered today demonstrates sinus rhythm  Recent Labs: 09/13/2020: ALT 23; B Natriuretic Peptide 31.8; BUN 13; Creatinine, Ser 0.68; Hemoglobin 15.3; Magnesium 2.1; Platelets 99; Potassium 4.2; Sodium 137; TSH 1.002  Recent Lipid Panel    Component Value Date/Time   CHOL 110 02/13/2020 0813   CHOL 115 02/22/2019 0928   CHOL 173 11/02/2015 0000   TRIG 82 02/13/2020 0813   TRIG 69 11/02/2015  0000   HDL 55 02/13/2020 0813   HDL 56 02/22/2019 0928   CHOLHDL 2.0 02/13/2020 0813   VLDL 14 11/02/2015 0000   LDLCALC 39 02/13/2020 0813   LDLCALC 104 (A) 11/02/2015 0000    Physical Exam:    VS:  BP 130/80 (BP Location: Left Arm, Patient Position: Sitting, Cuff Size: Normal)   Pulse 62   Ht 5' 9.5" (1.765 m)   Wt 173 lb (78.5 kg)   BMI 25.18 kg/m     Wt Readings from Last 3 Encounters:  12/05/20 173 lb (78.5 kg)  11/07/20 176 lb (79.8 kg)  09/13/20 180 lb 9.6 oz (81.9 kg)     GEN:  Well nourished, well developed in no acute distress HEENT: Normal NECK: No JVD; No carotid bruits LYMPHATICS: No lymphadenopathy CARDIAC: RRR, no murmurs, rubs, gallops RESPIRATORY:  Clear to auscultation without rales, wheezing or rhonchi  ABDOMEN: Soft, non-tender, non-distended MUSCULOSKELETAL:  No edema; No deformity  SKIN: Warm and dry NEUROLOGIC:  Alert and oriented x 3 PSYCHIATRIC:  Normal affect   ASSESSMENT:    1. Syncope and collapse   2. S/P aortic valve replacement with bioprosthetic valve    PLAN:    In order of problems listed above:   1. Syncope and collapse No further episodes since I last saw the patient.  Discussed loop recorder implant with the patient during today's visit including risks and recovery time and he wishes to proceed.  2. S/P aortic valve replacement with bioprosthetic valve Euvolemic.  Hemodynamically stable.        Medication Adjustments/Labs and Tests  Ordered: Current medicines are reviewed at length with the patient today.  Concerns regarding medicines are outlined above.  No orders of the defined types were placed in this encounter.  No orders of the defined types were placed in this encounter.    Signed, Lars Mage, MD, [facility], [facility] 12/05/2020 8:45 AM    Electrophysiology [facility]   ----------------------------------------------------------------  SURGEON:  Lars Mage, MD    PREPROCEDURE DIAGNOSIS:  Syncope    POSTPROCEDURE DIAGNOSIS:  Syncope     PROCEDURES:   1. Implantable loop recorder implantation    INTRODUCTION: Mr. Simkins is a 65 y.o. patient with syncope who presents today for implantable loop implantation.      DESCRIPTION OF PROCEDURE:  Informed written consent was obtained.  The patient required no sedation for the procedure today.   The patients left chest was therefore prepped and draped in the usual sterile fashion. The skin overlying the left parasternal region was infiltrated with lidocaine for local analgesia.  A 0.5-cm incision was made over the left parasternal region over the 3rd intercostal space.  A Medtronic Reveal Linq model [id number]) implantable loop recorder was then placed into the pocket  R waves were very prominent and measured >0.9m.  Steri- Strips and a sterile dressing were then applied.  There were no early apparent complications.     CONCLUSIONS:   1. Successful implantation of a Medtronic Reveal LINQ implantable loop recorder for syncope.  2. No early apparent complications.   CLysbeth GalasT. LQuentin Ore MD, [facility]

## 2020-12-25 ENCOUNTER — Ambulatory Visit (INDEPENDENT_AMBULATORY_CARE_PROVIDER_SITE_OTHER): Payer: Medicare PPO

## 2020-12-25 VITALS — Ht 69.0 in | Wt 170.0 lb

## 2020-12-25 DIAGNOSIS — Z0000 Encounter for general adult medical examination without abnormal findings: Secondary | ICD-10-CM | POA: Diagnosis not present

## 2020-12-25 NOTE — Patient Instructions (Signed)
Todd Frank , Thank you for taking time to come for your Medicare Wellness Visit. I appreciate your ongoing commitment to your health goals. Please review the following plan we discussed and let me know if I can assist you in the future.   Screening recommendations/referrals: Colonoscopy: cologuard 04/12/2020, due 04/13/2023 Recommended yearly ophthalmology/optometry visit for glaucoma screening and checkup Recommended yearly dental visit for hygiene and checkup  Vaccinations: Influenza vaccine: due Pneumococcal vaccine:  Tdap vaccine: completed 12/23/2012, due 12/24/2022 Shingles vaccine: completed   Covid-19: 08/02/2020, 01/13/2020, 07/14/2019, 06/23/2019  Advanced directives: copy in chart  Conditions/risks identified: none  Next appointment: Follow up in one year for your annual wellness visit.   Preventive Care 16 Years and Older, Male Preventive care refers to lifestyle choices and visits with your health care provider that can promote health and wellness. What does preventive care include? A yearly physical exam. This is also called an annual well check. Dental exams once or twice a year. Routine eye exams. Ask your health care provider how often you should have your eyes checked. Personal lifestyle choices, including: Daily care of your teeth and gums. Regular physical activity. Eating a healthy diet. Avoiding tobacco and drug use. Limiting alcohol use. Practicing safe sex. Taking low doses of aspirin every day. Taking vitamin and mineral supplements as recommended by your health care provider. What happens during an annual well check? The services and screenings done by your health care provider during your annual well check will depend on your age, overall health, lifestyle risk factors, and family history of disease. Counseling  Your health care provider may ask you questions about your: Alcohol use. Tobacco use. Drug use. Emotional well-being. Home and relationship  well-being. Sexual activity. Eating habits. History of falls. Memory and ability to understand (cognition). Work and work Statistician. Screening  You may have the following tests or measurements: Height, weight, and BMI. Blood pressure. Lipid and cholesterol levels. These may be checked every 5 years, or more frequently if you are over 4 years old. Skin check. Lung cancer screening. You may have this screening every year starting at age 52 if you have a 30-pack-year history of smoking and currently smoke or have quit within the past 15 years. Fecal occult blood test (FOBT) of the stool. You may have this test every year starting at age 63. Flexible sigmoidoscopy or colonoscopy. You may have a sigmoidoscopy every 5 years or a colonoscopy every 10 years starting at age 38. Prostate cancer screening. Recommendations will vary depending on your family history and other risks. Hepatitis C blood test. Hepatitis B blood test. Sexually transmitted disease (STD) testing. Diabetes screening. This is done by checking your blood sugar (glucose) after you have not eaten for a while (fasting). You may have this done every 1-3 years. Abdominal aortic aneurysm (AAA) screening. You may need this if you are a current or former smoker. Osteoporosis. You may be screened starting at age 16 if you are at high risk. Talk with your health care provider about your test results, treatment options, and if necessary, the need for more tests. Vaccines  Your health care provider may recommend certain vaccines, such as: Influenza vaccine. This is recommended every year. Tetanus, diphtheria, and acellular pertussis (Tdap, Td) vaccine. You may need a Td booster every 10 years. Zoster vaccine. You may need this after age 43. Pneumococcal 13-valent conjugate (PCV13) vaccine. One dose is recommended after age 75. Pneumococcal polysaccharide (PPSV23) vaccine. One dose is recommended after age 66. Talk  to your health care  provider about which screenings and vaccines you need and how often you need them. This information is not intended to replace advice given to you by your health care provider. Make sure you discuss any questions you have with your health care provider. Document Released: 04/20/2015 Document Revised: 12/12/2015 Document Reviewed: 01/23/2015 Elsevier Interactive Patient Education  2017 [place] Prevention in the Home Falls can cause injuries. They can happen to people of all ages. There are many things you can do to make your home safe and to help prevent falls. What can I do on the outside of my home? Regularly fix the edges of walkways and driveways and fix any cracks. Remove anything that might make you trip as you walk through a door, such as a raised step or threshold. Trim any bushes or trees on the path to your home. Use bright outdoor lighting. Clear any walking paths of anything that might make someone trip, such as rocks or tools. Regularly check to see if handrails are loose or broken. Make sure that both sides of any steps have handrails. Any raised decks and porches should have guardrails on the edges. Have any leaves, snow, or ice cleared regularly. Use sand or salt on walking paths during winter. Clean up any spills in your garage right away. This includes oil or grease spills. What can I do in the bathroom? Use night lights. Install grab bars by the toilet and in the tub and shower. Do not use towel bars as grab bars. Use non-skid mats or decals in the tub or shower. If you need to sit down in the shower, use a plastic, non-slip stool. Keep the floor dry. Clean up any water that spills on the floor as soon as it happens. Remove soap buildup in the tub or shower regularly. Attach bath mats securely with double-sided non-slip rug tape. Do not have throw rugs and other things on the floor that can make you trip. What can I do in the bedroom? Use night lights. Make  sure that you have a light by your bed that is easy to reach. Do not use any sheets or blankets that are too big for your bed. They should not hang down onto the floor. Have a firm chair that has side arms. You can use this for support while you get dressed. Do not have throw rugs and other things on the floor that can make you trip. What can I do in the kitchen? Clean up any spills right away. Avoid walking on wet floors. Keep items that you use a lot in easy-to-reach places. If you need to reach something above you, use a strong step stool that has a grab bar. Keep electrical cords out of the way. Do not use floor polish or wax that makes floors slippery. If you must use wax, use non-skid floor wax. Do not have throw rugs and other things on the floor that can make you trip. What can I do with my stairs? Do not leave any items on the stairs. Make sure that there are handrails on both sides of the stairs and use them. Fix handrails that are broken or loose. Make sure that handrails are as long as the stairways. Check any carpeting to make sure that it is firmly attached to the stairs. Fix any carpet that is loose or worn. Avoid having throw rugs at the top or bottom of the stairs. If you do have throw rugs,  attach them to the floor with carpet tape. Make sure that you have a light switch at the top of the stairs and the bottom of the stairs. If you do not have them, ask someone to add them for you. What else can I do to help prevent falls? Wear shoes that: Do not have high heels. Have rubber bottoms. Are comfortable and fit you well. Are closed at the toe. Do not wear sandals. If you use a stepladder: Make sure that it is fully opened. Do not climb a closed stepladder. Make sure that both sides of the stepladder are locked into place. Ask someone to hold it for you, if possible. Clearly mark and make sure that you can see: Any grab bars or handrails. First and last steps. Where the  edge of each step is. Use tools that help you move around (mobility aids) if they are needed. These include: Canes. Walkers. Scooters. Crutches. Turn on the lights when you go into a dark area. Replace any light bulbs as soon as they burn out. Set up your furniture so you have a clear path. Avoid moving your furniture around. If any of your floors are uneven, fix them. If there are any pets around you, be aware of where they are. Review your medicines with your doctor. Some medicines can make you feel dizzy. This can increase your chance of falling. Ask your doctor what other things that you can do to help prevent falls. This information is not intended to replace advice given to you by your health care provider. Make sure you discuss any questions you have with your health care provider. Document Released: 01/18/2009 Document Revised: 08/30/2015 Document Reviewed: 04/28/2014 Elsevier Interactive Patient Education  2017 [employer].

## 2020-12-25 NOTE — Progress Notes (Signed)
I connected with Todd Frank today by telephone and verified that I am speaking with the correct person using two identifiers. Location patient: home Location provider: work Persons participating in the virtual visit: Drue, Harr LPN.   I discussed the limitations, risks, security and privacy concerns of performing an evaluation and management service by telephone and the availability of in person appointments. I also discussed with the patient that there may be a patient responsible charge related to this service. The patient expressed understanding and verbally consented to this telephonic visit.    Interactive audio and video telecommunications were attempted between this provider and patient, however failed, due to patient having technical difficulties OR patient did not have access to video capability.  We continued and completed visit with audio only.     Vital signs may be patient reported or missing.  Subjective:   Todd Frank is a 65 y.o. male who presents for an Initial Medicare Annual Wellness Visit.  Review of Systems     Cardiac Risk Factors include: advanced age (>19men, >72 women);hypertension;male gender     Objective:    Today's Vitals   12/25/20 1317  Weight: 170 lb (77.1 kg)  Height: 5\' 9"  (1.753 m)   Body mass index is 25.1 kg/m.  Advanced Directives 12/25/2020 09/13/2020 06/25/2020 06/04/2020 07/27/2017 06/27/2017 06/25/2017  Does Patient Have a Medical Advance Directive? Yes Yes Yes Yes Yes Yes Yes  Type of Paramedic of [place];Living will Healthcare Power of [place];Living will - [place];Living will No Name;Living will [place];Living will  Does patient want to make changes to medical advance directive? - - - - No - Patient declined No - Patient declined -  Copy of [place] in Chart? Yes - validated most  recent copy scanned in chart (See row information) - - - No - copy requested Yes Yes    Current Medications (verified) Outpatient Encounter Medications as of 12/25/2020  Medication Sig   aspirin EC 81 MG tablet Take 81 mg by mouth daily.   atorvastatin (LIPITOR) 80 MG tablet Take 1 tablet (80 mg total) by mouth daily.   cholecalciferol (VITAMIN D) 1000 units tablet Take 1,000 Units by mouth every evening.   Coenzyme Q10 (COQ-10) 200 MG CAPS Take 200 mg by mouth daily.   fluticasone (FLONASE) 50 MCG/ACT nasal spray Place 2 sprays into both nostrils daily as needed (for allergies.).   vitamin B-12 (CYANOCOBALAMIN) 1000 MCG tablet Take 1,000 mcg by mouth daily.   No facility-administered encounter medications on file as of 12/25/2020.    Allergies (verified) Patient has no known allergies.   History: Past Medical History:  Diagnosis Date   Aortic stenosis    a. syncope/severe AS s/p bioprosthetic AVR 06/2017.   Arthritis    Ascending aorta dilatation (HCC)    33mm by echo 11/2019   Bicuspid aortic valve    Bradycardia    secondary to BB   Broken clavicle    as child, can still dislocate at times   CAD (coronary artery disease), native coronary artery    a. s/p 2V CABG at time of AVR 06/2017.   Carotid bruit    carotid dopplers negative.  Bruit due to heart murmur from AS   Congenital nevus    Diastolic dysfunction    Dysplastic nevus 12/12/2008   Left mid back, 9cm lat. to spine. Moderate atypia, extends to one edge.  Dysplastic nevus 12/12/2008   Left mid to low back, 7cm lat. to spine. Moderate atypia, close to margin.    Dysplastic nevus 09/24/2012   Right mid lateral buttocks. Moderate atypia, close to edge.    Dysplastic nevus 08/15/2020   Left mid back infrascapular - excised 09/25/20   Dysplastic nevus 10/02/2020   left post deltoid near tricep, mild atypia   Dysplastic nevus 10/02/2020   right ant lateral prox thigh, severe, Shave removal 11/19/20   GERD  (gastroesophageal reflux disease)    Headache    optical migraine   Hx of melanoma in situ 06/26/2009   Left superior thigh. Arising in dysplastic nevus, close to margin. Excised 07/11/2009, margins free.   Hyperlipidemia    LDL goal < 70   LVH (left ventricular hypertrophy)    Melanoma of skin (HCC)    Obesity    PFO (patent foramen ovale)    small by echo 2014, but not demonstrated during intra-op note from CABG/AVR 06/2017.   Past Surgical History:  Procedure Laterality Date   AORTIC VALVE REPLACEMENT N/A 06/25/2017   Procedure: AORTIC VALVE REPLACEMENT (AVR) using Magna Ease Aortic Valve size 58mm;  Surgeon: Ivin Poot, MD;  Location: [place];  Service: Open Heart Surgery;  Laterality: N/A;   arthritic cyst removal  2017   from Lumbar 4-5, dr pool   CARDIAC CATHETERIZATION N/A 02/20/2016   Procedure: Right/Left Heart Cath and Coronary Angiography;  Surgeon: Belva Crome, MD;  Location: [facility];  Service: Cardiovascular;  Laterality: N/A;   CORONARY ARTERY BYPASS GRAFT N/A 06/25/2017   Procedure: CORONARY ARTERY BYPASS GRAFTING (CABG) x Two , using left internal mammary artery and right leg greater saphenous vein harvested endoscopically;  Surgeon: Ivin Poot, MD;  Location: [place];  Service: Open Heart Surgery;  Laterality: N/A;   CORONARY/GRAFT ANGIOGRAPHY N/A 06/02/2017   Procedure: CORONARY/GRAFT ANGIOGRAPHY;  Surgeon: Nelva Bush, MD;  Location: [place] [facility];  Service: Cardiovascular;  Laterality: N/A;   EYE SURGERY     MELANOMA EXCISION  2011   left thigh   RIGHT HEART CATH N/A 06/02/2017   Procedure: RIGHT HEART CATH;  Surgeon: Nelva Bush, MD;  Location: [place];  Service: Cardiovascular;  Laterality: N/A;   TEE WITHOUT CARDIOVERSION N/A 06/25/2017   Procedure: TRANSESOPHAGEAL ECHOCARDIOGRAM (TEE);  Surgeon: Prescott Gum, Collier Salina, MD;  Location: [facility];  Service: Open Heart Surgery;  Laterality: N/A;   TONSILLECTOMY     Family History   Problem Relation Age of Onset   Diabetes Mother    Heart disease Mother    Arthritis Father    Arthritis Sister    Heart disease Paternal Grandmother        MI   Heart attack Other        family history   Pancreatic cancer Maternal Aunt    Social History   Socioeconomic History   Marital status: Married    Spouse name: [name]   Number of children: 0   Years of education: Assoc degree   Highest education level: Not on file  Occupational History   Occupation: Government social research officer - [employer]  Tobacco Use   Smoking status: Former    Packs/day: 1.00    Types: Cigarettes    Quit date: 04/07/1976    Years since quitting: 44.7   Smokeless tobacco: Former   Tobacco comments:    quit in 70, teenager  Vaping Use   Vaping Use: Never used  Substance and Sexual Activity   Alcohol use: Yes    Alcohol/week: 1.0 standard drink    Types: 1 Cans of beer per week    Comment: occasional   Drug use: No    Comment: quit marijuana in 1990   Sexual activity: Yes  Other Topics Concern   Not on file  Social History Narrative   Lives with wife   Caffeine < 1 cup daily   Social Determinants of Health   Financial Resource Strain: Low Risk    Difficulty of Paying Living Expenses: Not hard at all  Food Insecurity: No Food Insecurity   Worried About Charity fundraiser in the Last Year: Never true   Ran Out of Food in the Last Year: Never true  Transportation Needs: No Transportation Needs   Lack of Transportation (Medical): No   Lack of Transportation (Non-Medical): No  Physical Activity: Sufficiently Active   Days of Exercise per Week: 7 days   Minutes of Exercise per Session: 60 min  Stress: No Stress Concern Present   Feeling of Stress : Not at all  Social Connections: Not on file    Tobacco Counseling Counseling given: Not Answered Tobacco comments: quit in 78, teenager   Clinical Intake:  Pre-visit preparation completed: Yes        Nutritional Status: BMI 25 -29  Overweight Nutritional Risks: None Diabetes: No  How often do you need to have someone help you when you read instructions, pamphlets, or other written materials from your doctor or pharmacy?: 1 - Never What is the last grade level you completed in school?: associates degree  Diabetic? no  Interpreter Needed?: No  Information entered by :: NAllen LPN   Activities of Daily Living In your present state of health, do you have any difficulty performing the following activities: 12/25/2020 02/17/2020  Hearing? N N  Vision? N N  Difficulty concentrating or making decisions? N N  Walking or climbing stairs? N N  Dressing or bathing? N N  Doing errands, shopping? N N  Preparing Food and eating ? N -  Using the Toilet? N -  In the past six months, have you accidently leaked urine? N -  Do you have problems with loss of bowel control? N -  Managing your Medications? N -  Managing your Finances? N -  Housekeeping or managing your Housekeeping? N -  Some recent data might be hidden    Patient Care Team: Olin Hauser, DO as PCP - General (Family Medicine) [name], MD as PCP - Cardiology (Cardiology) Vickie Epley, MD as PCP - Electrophysiology (Cardiology) [name], MD as Consulting Physician (Cardiology) Sindy Guadeloupe, MD as Consulting Physician (Hematology and Oncology)  Indicate any recent Medical Services you may have received from other than [facility] providers in the past year (date may be approximate).     Assessment:   This is a routine wellness examination for Todd Frank.  Hearing/Vision screen Vision Screening - Comments:: Regular eye exams, [facility]  Dietary issues and exercise activities discussed: Current Exercise Habits: Home exercise routine, Type of exercise: treadmill;strength training/weights (nu step machine), Time (Minutes): 60, Frequency (Times/Week): 7, Weekly Exercise (Minutes/Week): 420   Goals Addressed              This Visit's Progress    Patient Stated       12/25/2020, wants to weigh 155-160 pounds       Depression Screen PHQ 2/9 Scores 12/25/2020 03/20/2020  02/17/2020 02/15/2019 02/12/2018 11/13/2017 09/08/2017  PHQ - 2 Score 0 0 0 0 0 0 0  PHQ- 9 Score - - - 0 - - -    Fall Risk Fall Risk  12/25/2020 03/20/2020 02/17/2020 02/15/2019 02/12/2018  Falls in the past year? 0 0 0 0 0  Comment - - - - -  Number falls in past yr: - 0 0 - -  Injury with Fall? - 0 0 - -  Risk for fall due to : No Fall Risks - - - -  Follow up Falls evaluation completed;Education provided;Falls prevention discussed Falls evaluation completed Falls evaluation completed - -    FALL RISK PREVENTION PERTAINING TO THE HOME:  Any stairs in or around the home? Yes  If so, are there any without handrails? No  Home free of loose throw rugs in walkways, pet beds, electrical cords, etc? Yes  Adequate lighting in your home to reduce risk of falls? Yes   ASSISTIVE DEVICES UTILIZED TO PREVENT FALLS:  Life alert? No  Use of a cane, walker or w/c? No  Grab bars in the bathroom? Yes  Shower chair or bench in shower? No  Elevated toilet seat or a handicapped toilet? No   TIMED UP AND GO:  Was the test performed? No .      Cognitive Function:     6CIT Screen 12/25/2020  What Year? 0 points  What month? 0 points  What time? 0 points  Count back from 20 0 points  Months in reverse 0 points  Repeat phrase 0 points  Total Score 0    Immunizations Immunization History  Administered Date(s) Administered   Influenza Inj Mdck Quad Pf 01/02/2018   Influenza,inj,Quad PF,6+ Mos 02/02/2015, 01/09/2016, 01/23/2017, 12/24/2018   Influenza-Unspecified 01/02/2018, 01/05/2019, 01/17/2020   PFIZER(Purple Top)SARS-COV-2 Vaccination 06/23/2019, 07/14/2019, 01/13/2020, 08/02/2020   Tdap 12/23/2012   Zoster Recombinat (Shingrix) 12/19/2017, 03/19/2018   Zoster, Live 06/21/2013    TDAP status: Up to date  Flu Vaccine status: Due,  Education has been provided regarding the importance of this vaccine. Advised may receive this vaccine at local pharmacy or Health Dept. Aware to provide a copy of the vaccination record if obtained from local pharmacy or Health Dept. Verbalized acceptance and understanding.  Pneumococcal vaccine status: Up to date  Covid-19 vaccine status: Completed vaccines  Qualifies for Shingles Vaccine? Yes   Zostavax completed Yes   Shingrix Completed?: Yes  Screening Tests Health Maintenance  Topic Date Due   INFLUENZA VACCINE  11/05/2020   COVID-19 Vaccine (5 - Booster for [place] series) 12/02/2020   TETANUS/TDAP  12/24/2022   Fecal DNA (Cologuard)  04/13/2023   Hepatitis C Screening  Completed   HIV Screening  Completed   Zoster Vaccines- Shingrix  Completed   HPV VACCINES  Aged Out    Health Maintenance  Health Maintenance Due  Topic Date Due   INFLUENZA VACCINE  11/05/2020   COVID-19 Vaccine (5 - Booster for [place] series) 12/02/2020    Colorectal cancer screening: Type of screening: Cologuard. Completed 04/12/2020. Repeat every 3 years  Lung Cancer Screening: (Low Dose CT Chest recommended if Age 64-80 years, 30 pack-year currently smoking OR have quit w/in 15years.) does not qualify.   Lung Cancer Screening Referral: no  Additional Screening:  Hepatitis C Screening: does qualify; Completed 06/04/2020  Vision Screening: Recommended annual ophthalmology exams for early detection of glaucoma and other disorders of the eye. Is the patient up to date with their annual eye exam?  Yes  Who is the provider or what is the name of the office in which the patient attends annual eye exams? [facility] If pt is not established with a provider, would they like to be referred to a provider to establish care? No .   Dental Screening: Recommended annual dental exams for proper oral hygiene  Community Resource Referral / Chronic Care Management: CRR required this visit?  No   CCM  required this visit?  No      Plan:     I have personally reviewed and noted the following in the patient's chart:   Medical and social history Use of alcohol, tobacco or illicit drugs  Current medications and supplements including opioid prescriptions. Patient is not currently taking opioid prescriptions. Functional ability and status Nutritional status Physical activity Advanced directives List of other physicians Hospitalizations, surgeries, and ER visits in previous 12 months Vitals Screenings to include cognitive, depression, and falls Referrals and appointments  In addition, I have reviewed and discussed with patient certain preventive protocols, quality metrics, and best practice recommendations. A written personalized care plan for preventive services as well as general preventive health recommendations were provided to patient.     Kellie Simmering, LPN   [id number]   Nurse Notes:

## 2020-12-27 ENCOUNTER — Encounter: Payer: Self-pay | Admitting: Oncology

## 2020-12-28 ENCOUNTER — Other Ambulatory Visit: Payer: Self-pay | Admitting: Family Medicine

## 2020-12-28 ENCOUNTER — Telehealth: Payer: Self-pay

## 2020-12-28 DIAGNOSIS — D696 Thrombocytopenia, unspecified: Secondary | ICD-10-CM

## 2020-12-28 DIAGNOSIS — E538 Deficiency of other specified B group vitamins: Secondary | ICD-10-CM | POA: Diagnosis not present

## 2020-12-28 LAB — CBC WITH DIFFERENTIAL/PLATELET
Absolute Monocytes: 405 cells/uL (ref 200–950)
Basophils Absolute: 68 cells/uL (ref 0–200)
Basophils Relative: 1.5 %
Eosinophils Absolute: 266 cells/uL (ref 15–500)
Eosinophils Relative: 5.9 %
HCT: 44.8 % (ref 38.5–50.0)
Hemoglobin: 15.1 g/dL (ref 13.2–17.1)
Lymphs Abs: 1040 cells/uL (ref 850–3900)
MCH: 31.4 pg (ref 27.0–33.0)
MCHC: 33.7 g/dL (ref 32.0–36.0)
MCV: 93.1 fL (ref 80.0–100.0)
MPV: 12.5 fL (ref 7.5–12.5)
Monocytes Relative: 9 %
Neutro Abs: 2723 cells/uL (ref 1500–7800)
Neutrophils Relative %: 60.5 %
Platelets: 114 10*3/uL — ABNORMAL LOW (ref 140–400)
RBC: 4.81 10*6/uL (ref 4.20–5.80)
RDW: 12.6 % (ref 11.0–15.0)
Total Lymphocyte: 23.1 %
WBC: 4.5 10*3/uL (ref 3.8–10.8)

## 2020-12-28 LAB — VITAMIN B12: Vitamin B-12: 1225 pg/mL — ABNORMAL HIGH (ref 200–1100)

## 2020-12-28 NOTE — Telephone Encounter (Signed)
Pt would like lab work done at [facility] due to living closer to their office. Reached out to their office and they are okay with doing labwork (CBC and B12). Pt did not answer, left VM stating details and provided a call back phone number in case of any questions. Pt does not need appointment can walk in anytime between 8-12 or 1:20-4:30pm.

## 2021-01-04 ENCOUNTER — Encounter: Payer: Self-pay | Admitting: Family Medicine

## 2021-01-07 ENCOUNTER — Ambulatory Visit (INDEPENDENT_AMBULATORY_CARE_PROVIDER_SITE_OTHER): Payer: Medicare PPO

## 2021-01-07 DIAGNOSIS — R55 Syncope and collapse: Secondary | ICD-10-CM | POA: Diagnosis not present

## 2021-01-08 LAB — CUP PACEART REMOTE DEVICE CHECK
Date Time Interrogation Session: 20221003153807
Implantable Pulse Generator Implant Date: 20220831

## 2021-01-09 ENCOUNTER — Encounter: Payer: Self-pay | Admitting: Dermatology

## 2021-01-15 NOTE — Progress Notes (Signed)
Carelink Summary Report / Loop Recorder 

## 2021-01-16 ENCOUNTER — Encounter: Payer: Self-pay | Admitting: Family Medicine

## 2021-01-16 DIAGNOSIS — I10 Essential (primary) hypertension: Secondary | ICD-10-CM

## 2021-01-16 DIAGNOSIS — D696 Thrombocytopenia, unspecified: Secondary | ICD-10-CM

## 2021-01-16 DIAGNOSIS — R351 Nocturia: Secondary | ICD-10-CM

## 2021-01-16 DIAGNOSIS — R7309 Other abnormal glucose: Secondary | ICD-10-CM

## 2021-01-16 DIAGNOSIS — Z0000 Encounter for general adult medical examination without abnormal findings: Secondary | ICD-10-CM

## 2021-01-16 DIAGNOSIS — E7800 Pure hypercholesterolemia, unspecified: Secondary | ICD-10-CM

## 2021-01-21 ENCOUNTER — Encounter: Payer: Self-pay | Admitting: Family Medicine

## 2021-02-10 LAB — CUP PACEART REMOTE DEVICE CHECK
Date Time Interrogation Session: 20221105153555
Implantable Pulse Generator Implant Date: 20220831

## 2021-02-11 ENCOUNTER — Ambulatory Visit (INDEPENDENT_AMBULATORY_CARE_PROVIDER_SITE_OTHER): Payer: Medicare PPO

## 2021-02-11 DIAGNOSIS — R55 Syncope and collapse: Secondary | ICD-10-CM | POA: Diagnosis not present

## 2021-02-12 ENCOUNTER — Other Ambulatory Visit: Payer: Self-pay

## 2021-02-12 DIAGNOSIS — R351 Nocturia: Secondary | ICD-10-CM | POA: Diagnosis not present

## 2021-02-12 DIAGNOSIS — R7309 Other abnormal glucose: Secondary | ICD-10-CM | POA: Diagnosis not present

## 2021-02-12 DIAGNOSIS — Z0000 Encounter for general adult medical examination without abnormal findings: Secondary | ICD-10-CM | POA: Diagnosis not present

## 2021-02-12 DIAGNOSIS — E7800 Pure hypercholesterolemia, unspecified: Secondary | ICD-10-CM | POA: Diagnosis not present

## 2021-02-13 LAB — LIPID PANEL
Cholesterol: 107 mg/dL (ref <200)
HDL: 55 mg/dL (ref >=40)
LDL Cholesterol (Calc): 37 mg/dL (calc)
Non-HDL Cholesterol (Calc): 52 mg/dL (calc) (ref <130)
Total CHOL/HDL Ratio: 1.9 (calc) (ref <5.0)
Triglycerides: 66 mg/dL (ref <150)

## 2021-02-13 LAB — COMPLETE METABOLIC PANEL WITH GFR
AG Ratio: 2.3 (calc) (ref 1.0–2.5)
ALT: 27 U/L (ref 9–46)
AST: 31 U/L (ref 10–35)
Albumin: 4.4 g/dL (ref 3.6–5.1)
Alkaline phosphatase (APISO): 93 U/L (ref 35–144)
BUN: 13 mg/dL (ref 7–25)
CO2: 25 mmol/L (ref 20–32)
Calcium: 9.5 mg/dL (ref 8.6–10.3)
Chloride: 105 mmol/L (ref 98–110)
Creat: 0.79 mg/dL (ref 0.70–1.35)
Globulin: 1.9 g/dL (calc) (ref 1.9–3.7)
Glucose, Bld: 85 mg/dL (ref 65–99)
Potassium: 3.9 mmol/L (ref 3.5–5.3)
Sodium: 138 mmol/L (ref 135–146)
Total Bilirubin: 1.5 mg/dL — ABNORMAL HIGH (ref 0.2–1.2)
Total Protein: 6.3 g/dL (ref 6.1–8.1)
eGFR: 98 mL/min/{1.73_m2} (ref >=60)

## 2021-02-13 LAB — HEMOGLOBIN A1C
Hgb A1c MFr Bld: 5 % of total Hgb (ref <5.7)
Mean Plasma Glucose: 97 mg/dL
eAG (mmol/L): 5.4 mmol/L

## 2021-02-13 LAB — PSA: PSA: 0.69 ng/mL (ref <=4.00)

## 2021-02-15 NOTE — Progress Notes (Signed)
Carelink Summary Report / Loop Recorder 

## 2021-02-18 ENCOUNTER — Encounter: Payer: Self-pay | Admitting: Family Medicine

## 2021-02-18 ENCOUNTER — Ambulatory Visit (INDEPENDENT_AMBULATORY_CARE_PROVIDER_SITE_OTHER): Payer: Medicare PPO | Admitting: Family Medicine

## 2021-02-18 ENCOUNTER — Other Ambulatory Visit: Payer: Self-pay

## 2021-02-18 VITALS — BP 123/81 | HR 71 | Ht 69.0 in | Wt 171.4 lb

## 2021-02-18 DIAGNOSIS — D696 Thrombocytopenia, unspecified: Secondary | ICD-10-CM | POA: Diagnosis not present

## 2021-02-18 DIAGNOSIS — Z23 Encounter for immunization: Secondary | ICD-10-CM | POA: Diagnosis not present

## 2021-02-18 DIAGNOSIS — G4733 Obstructive sleep apnea (adult) (pediatric): Secondary | ICD-10-CM

## 2021-02-18 DIAGNOSIS — Z0000 Encounter for general adult medical examination without abnormal findings: Secondary | ICD-10-CM | POA: Diagnosis not present

## 2021-02-18 DIAGNOSIS — E7800 Pure hypercholesterolemia, unspecified: Secondary | ICD-10-CM | POA: Diagnosis not present

## 2021-02-18 DIAGNOSIS — I10 Essential (primary) hypertension: Secondary | ICD-10-CM

## 2021-02-18 DIAGNOSIS — Z9989 Dependence on other enabling machines and devices: Secondary | ICD-10-CM | POA: Diagnosis not present

## 2021-02-18 DIAGNOSIS — E663 Overweight: Secondary | ICD-10-CM

## 2021-02-18 NOTE — Patient Instructions (Addendum)
Thank you for coming to the office today.  Prevnar 20 today - then next final dose Pneumovax-23 in 1 year.  Follow-up with Dr Janese Banks as advised  Bilirubin seems to check out to be hereditary benign issue.  Keep up with Cardiology on loop monitor report.  DUE for FASTING BLOOD WORK (no food or drink after midnight before the lab appointment, only water or coffee without cream/sugar on the morning of)  SCHEDULE "Lab Only" visit in the morning at the clinic for lab draw in 1 YEAR  - Make sure Lab Only appointment is at about 1 week before your next appointment, so that results will be available  For Lab Results, once available within 2-3 days of blood draw, you can can log in to MyChart online to view your results and a brief explanation. Also, we can discuss results at next follow-up visit.   Please schedule a Follow-up Appointment to: Return in about 1 year (around 02/18/2022) for 1 year fasting lab only [facility] then 1 week later Annual Physical.  If you have any other questions or concerns, please feel free to call the office or send a message through [place]. You may also schedule an earlier appointment if necessary.  Additionally, you may be receiving a survey about your experience at our office within a few days to 1 week by e-mail or mail. We value your feedback.  Nobie Putnam, DO [place]

## 2021-02-18 NOTE — Assessment & Plan Note (Signed)
Well-controlled HTN - Home BP readings controlled  Complication CAD, AS s/p AVR CABG Followed by Cardiology Off BB metoprolol due to bradycardia  Plan:  1. No meds 2. Encourage improved lifestyle - low sodium diet, regular exercise 3. Continue monitor BP outside office, bring readings to next visit, if persistently >140/90 or new symptoms notify office sooner 4. Follow-up 1 year for annual 

## 2021-02-18 NOTE — Assessment & Plan Note (Signed)
Controlled on Atorvastatin 80mg 

## 2021-02-18 NOTE — Assessment & Plan Note (Signed)
Well controlled, chronic OSA on CPAP - Good adherence to CPAP nightly - Continue current CPAP therapy, patient seems to be benefiting from therapy  

## 2021-02-18 NOTE — Progress Notes (Signed)
Subjective:    Patient ID: Todd Frank, male    DOB: [birth year], 65 y.o.   MRN: [id number]  Todd Frank is a 65 y.o. male presenting on 02/18/2021 for Annual Exam   HPI  Here for Annual Physical and Lab Review.  Elevated Total Bilirubin, / Direct Hyperbilirubinemia Reports that this problem started 09/2018 with elevated Bilirubin testing. Repeat testing has remained fairly consistently elevated. Recent testing showed Abdominal Complete Ultrasound normal except cholelithiasis, see results below. He has no significant symptom from this Hyperbilirubinemia, no jaundice or other concerns. Has seen Dr Allen Norris AGI and had further labs with normal GGT and Unconjugated bilirubin determined, to be benign.  Lifestyle / BMI >25 - Overall doing well with lifestyle, some weight loss, some fluctuation - He continues balanced diet, limited without gym right now   Thrombocytopenia, chronic Last lab showed mild low 113 plt. Previous history of frequently low PLT - peripheral smear was giant platelet and clumping. No new concerns Denies bleeding or bruising   CHRONIC HTN: Reports no recent concerns, remains well controlled, checks BP regularly at home. Current Meds - off meds Off BB metoprolol due to bradycardia   CAD, Severe Aortic Stenosis s/p AVR - Followed by [facility] (Dr [name]) / Cardiothoracic Surgery Dr Prescott Gum S/p AVR CABG. He has done well. Completed cardiac rehab. - Last ECHO 11/2019, will continue to follow - On ASA 21m daily recently seen, he is doing well and exercising, walking regularly daily, he maintains HR with exercise He had episode of Syncopal episode 11/2020, thought to be Vasovagal Episode. He was thinking this was the cause. He called EMS and went to hospital did EKG, CT Head / MRI, CT Coronary, ECHO. He was advised to hold off on driving for up to 6 months. - He had Loop monitor implanted, had rare episode of bradycardia. They are monitoring monthly  report.   HYPERLIPIDEMIA: - Followed by Cardiology, has been on high dose lipitor with Atorvastatin 894mdaily, goal LDL < 70 Last lab lipid 02/2021 was controlled LDL < 40. HDL normal.   History of Dermatology [facility], Dr KoNehemiah Massedrior malignant melanoma removed. AKs frozen in past     Follow-up Dizziness/Lightheadedness / Seasonal Allergies / Sinusitis - controlled on Flonase.    OSA on CPAP He is doing well. Controlled on auto CPAP. Using nightly. No concerns.      Health Maintenance:  Due for initial pneumonia vaccine at age 65 will receive Prevnar-20 today   UTD Shingrix  - Colon CA Screening: UTD - last screen w/ cologuard 03/22/17 (negative) next due in 03/2020 - Last Colon[facility] >10 years ago approx [facility] results with reported normal next due 10 years, 11/2016. Currently asymptomatic. No known family history of colon CA - Completed Cologuard 04/12/20 negative, next due in 3 years 04/2023   - UTD Flu vaccine 01/03/21   - UTD routine Hep C and HIV screen   - UTD TDap   - Prostate CA Screening: Last prostate CA screening PSA 0.69 (02/2021) previous around 0.6 to 1, prior DRE reported normal. Currently asymptomatic. No known family history of prostate CA   [facility] 02/18/2021 12/25/2020 03/20/2020  Decreased Interest 0 0 0  Down, Depressed, Hopeless 0 0 0  PHQ - 2 Score 0 0 0  Altered sleeping 0 - -  Tired, decreased energy 0 - -  Change in appetite 0 - -  Feeling bad or  failure about yourself  0 - -  Trouble concentrating 0 - -  Moving slowly or fidgety/restless 0 - -  Suicidal thoughts 0 - -  PHQ-9 Score 0 - -  Difficult doing work/chores Not difficult at all - -  Some recent data might be hidden    Past Medical History:  Diagnosis Date   Aortic stenosis    a. syncope/severe AS s/p bioprosthetic AVR 06/2017.   Arthritis    Ascending aorta dilatation (HCC)    70m by echo 11/2019   Bicuspid aortic valve     Bradycardia    secondary to BB   Broken clavicle    as child, can still dislocate at times   CAD (coronary artery disease), native coronary artery    a. s/p 2V CABG at time of AVR 06/2017.   Carotid bruit    carotid dopplers negative.  Bruit due to heart murmur from AS   Congenital nevus    Diastolic dysfunction    Dysplastic nevus 12/12/2008   Left mid back, 9cm lat. to spine. Moderate atypia, extends to one edge.    Dysplastic nevus 12/12/2008   Left mid to low back, 7cm lat. to spine. Moderate atypia, close to margin.    Dysplastic nevus 09/24/2012   Right mid lateral buttocks. Moderate atypia, close to edge.    Dysplastic nevus 08/15/2020   Left mid back infrascapular - excised 09/25/20   Dysplastic nevus 10/02/2020   left post deltoid near tricep, mild atypia   Dysplastic nevus 10/02/2020   right ant lateral prox thigh, severe, Shave removal 11/19/20   GERD (gastroesophageal reflux disease)    Headache    optical migraine   Hx of melanoma in situ 06/26/2009   Left superior thigh. Arising in dysplastic nevus, close to margin. Excised 07/11/2009, margins free.   Hyperlipidemia    LDL goal < 70   LVH (left ventricular hypertrophy)    Melanoma of skin (HCC)    Obesity    PFO (patent foramen ovale)    small by echo 2014, but not demonstrated during intra-op note from CABG/AVR 06/2017.   Past Surgical History:  Procedure Laterality Date   AORTIC VALVE REPLACEMENT N/A 06/25/2017   Procedure: AORTIC VALVE REPLACEMENT (AVR) using Magna Ease Aortic Valve size 252m  Surgeon: VaIvin PootMD;  [facility]: Open [facility] Surgery;  Laterality: N/A;   arthritic cyst removal  2017   from Lumbar 4-5, dr pool   CARDIAC CATHETERIZATION N/A 02/20/2016   Procedure: Right/Left Heart Cath and Coronary Angiography;  Surgeon: HeBelva CromeMD;  [facility];  Service: Cardiovascular;  Laterality: N/A;   CORONARY ARTERY BYPASS GRAFT N/A 06/25/2017   Procedure:  CORONARY ARTERY BYPASS GRAFTING (CABG) x Two , using left internal mammary artery and right leg greater saphenous vein harvested endoscopically;  Surgeon: VaIvin PootMD;  [place] [facility]: [facility];  Laterality: N/A;   CORONARY/GRAFT ANGIOGRAPHY N/A 06/02/2017   Procedure: CORONARY/GRAFT ANGIOGRAPHY;  Surgeon: EnNelva BushMD;  [facility];  Service: Cardiovascular;  Laterality: N/A;   EYE SURGERY     MELANOMA EXCISION  2011   left thigh   RIGHT HEART CATH N/A 06/02/2017   Procedure: RIGHT HEART CATH;  Surgeon: EnNelva BushMD;  [facility];  Service: Cardiovascular;  Laterality: N/A;   TEE WITHOUT CARDIOVERSION N/A 06/25/2017   Procedure: TRANSESOPHAGEAL ECHOCARDIOGRAM (TEE);  Surgeon: VaPrescott GumPeCollier Salina  MD;  Location: [facility];  Service: Open Heart Surgery;  Laterality: N/A;   TONSILLECTOMY     Social History   Socioeconomic History   Marital status: Married    Spouse name: [name]   Number of children: 0   Years of education: Assoc degree   Highest education level: Not on file  Occupational History   Occupation: Government social research officer - [employer]  Tobacco Use   Smoking status: Former    Packs/day: 1.00    Types: Cigarettes    Quit date: 04/07/1976    Years since quitting: 44.8   Smokeless tobacco: Former   Tobacco comments:    quit in 78, teenager  Vaping Use   Vaping Use: Never used  Substance and Sexual Activity   Alcohol use: Yes    Alcohol/week: 1.0 standard drink    Types: 1 Cans of beer per week    Comment: occasional   Drug use: No    Comment: quit marijuana in 1990   Sexual activity: Yes  Other Topics Concern   Not on file  Social History Narrative   Lives with wife   Caffeine < 1 cup daily   Social Determinants of Health   Financial Resource Strain: Low Risk    Difficulty of Paying Living Expenses: Not hard at all  Food Insecurity: No Food Insecurity   Worried About Charity fundraiser in the Last  Year: Never true   Ran Out of Food in the Last Year: Never true  Transportation Needs: No Transportation Needs   Lack of Transportation (Medical): No   Lack of Transportation (Non-Medical): No  Physical Activity: Sufficiently Active   Days of Exercise per Week: 7 days   Minutes of Exercise per Session: 60 min  Stress: No Stress Concern Present   Feeling of Stress : Not at all  Social Connections: Not on file  Intimate Partner Violence: Not on file   Family History  Problem Relation Age of Onset   Diabetes Mother    Heart disease Mother    Arthritis Father    Arthritis Sister    Heart disease Paternal Grandmother        MI   Heart attack Other        family history   Pancreatic cancer Maternal Aunt    Current Outpatient Medications on File Prior to Visit  Medication Sig   aspirin EC 81 MG tablet Take 81 mg by mouth daily.   atorvastatin (LIPITOR) 80 MG tablet Take 1 tablet (80 mg total) by mouth daily.   cholecalciferol (VITAMIN D) 1000 units tablet Take 1,000 Units by mouth every evening.   Coenzyme Q10 (COQ-10) 200 MG CAPS Take 200 mg by mouth daily.   fluticasone (FLONASE) 50 MCG/ACT nasal spray Place 2 sprays into both nostrils daily as needed (for allergies.).   No current facility-administered medications on file prior to visit.    Review of Systems  Constitutional:  Negative for activity change, appetite change, chills, diaphoresis, fatigue and fever.  HENT:  Negative for congestion and hearing loss.   Eyes:  Negative for visual disturbance.  Respiratory:  Negative for cough, chest tightness, shortness of breath and wheezing.   Cardiovascular:  Negative for chest pain, palpitations and leg swelling.  Gastrointestinal:  Negative for abdominal pain, constipation, diarrhea, nausea and vomiting.  Genitourinary:  Negative for dysuria, frequency and hematuria.  Musculoskeletal:  Negative for arthralgias and neck pain.  Skin:  Negative for rash.  Neurological:  Negative  for dizziness,  weakness, light-headedness, numbness and headaches.  Hematological:  Negative for adenopathy.  Psychiatric/Behavioral:  Negative for behavioral problems, dysphoric mood and sleep disturbance.   Per HPI unless specifically indicated above      Objective:    BP 123/81   Pulse 71   Ht _0  (1.753 m)   Wt 171 lb 6.4 oz (77.7 kg)   SpO2 98%   BMI 25.31 kg/m   Wt Readings from Last 3 Encounters:  02/18/21 171 lb 6.4 oz (77.7 kg)  12/25/20 170 lb (77.1 kg)  12/05/20 173 lb (78.5 kg)    Physical Exam Vitals and nursing note reviewed.  Constitutional:      General: He is not in acute distress.    Appearance: He is well-developed. He is not diaphoretic.     Comments: Well-appearing, comfortable, cooperative  HENT:     Head: Normocephalic and atraumatic.  Eyes:     General:        Right eye: No discharge.        Left eye: No discharge.     Conjunctiva/sclera: Conjunctivae normal.     Pupils: Pupils are equal, round, and reactive to light.  Neck:     Thyroid: No thyromegaly.  Cardiovascular:     Rate and Rhythm: Normal rate and regular rhythm.     Pulses: Normal pulses.     Heart sounds: Normal heart sounds. No murmur heard. Pulmonary:     Effort: Pulmonary effort is normal. No respiratory distress.     Breath sounds: Normal breath sounds. No wheezing or rales.  Abdominal:     General: Bowel sounds are normal. There is no distension.     Palpations: Abdomen is soft. There is no mass.     Tenderness: There is no abdominal tenderness.  Musculoskeletal:        General: No tenderness. Normal range of motion.     Cervical back: Normal range of motion and neck supple.     Comments: Upper / Lower Extremities: - Normal muscle tone, strength bilateral upper extremities 5/5, lower extremities 5/5  Lymphadenopathy:     Cervical: No cervical adenopathy.  Skin:    General: Skin is warm and dry.     Findings: No erythema or rash.  Neurological:     Mental Status: He  is alert and oriented to person, place, and time.     Comments: Distal sensation intact to light touch all extremities  Psychiatric:        Mood and Affect: Mood normal.        Behavior: Behavior normal.        Thought Content: Thought content normal.     Comments: Well groomed, good eye contact, normal speech and thoughts   Results for orders placed or performed in visit on 02/12/21  PSA  Result Value Ref Range   PSA 0.69 < OR = 4.00 ng/mL  Lipid panel  Result Value Ref Range   Cholesterol 107 <200 mg/dL   HDL 55 > OR = 40 mg/dL   Triglycerides 66 <150 mg/dL   LDL Cholesterol (Calc) 37 mg/dL (calc)   Total CHOL/HDL Ratio 1.9 <5.0 (calc)   Non-HDL Cholesterol (Calc) 52 <130 mg/dL (calc)  COMPLETE METABOLIC PANEL WITH GFR  Result Value Ref Range   Glucose, Bld 85 65 - 99 mg/dL   BUN 13 7 - 25 mg/dL   Creat 0.79 0.70 - 1.35 mg/dL   eGFR 98 > OR = 60 mL/min/1.72m   BUN/Creatinine Ratio NOT  APPLICABLE 6 - 22 (calc)   Sodium 138 135 - 146 mmol/L   Potassium 3.9 3.5 - 5.3 mmol/L   Chloride 105 98 - 110 mmol/L   CO2 25 20 - 32 mmol/L   Calcium 9.5 8.6 - 10.3 mg/dL   Total Protein 6.3 6.1 - 8.1 g/dL   Albumin 4.4 3.6 - 5.1 g/dL   Globulin 1.9 1.9 - 3.7 g/dL (calc)   AG Ratio 2.3 1.0 - 2.5 (calc)   Total Bilirubin 1.5 (H) 0.2 - 1.2 mg/dL   Alkaline phosphatase (APISO) 93 35 - 144 U/L   AST 31 10 - 35 U/L   ALT 27 9 - 46 U/L  Hemoglobin A1c  Result Value Ref Range   Hgb A1c MFr Bld 5.0 <5.7 % of total Hgb   Mean Plasma Glucose 97 mg/dL   eAG (mmol/L) 5.4 mmol/L      Assessment & Plan:   Problem List Items Addressed This Visit     Unconjugated benign bilirubinemia   Thrombocytopenia (HCC)   Overweight (BMI 25.0-29.9)   OSA on CPAP    Well controlled, chronic OSA on CPAP - Good adherence to CPAP nightly - Continue current CPAP therapy, patient seems to be benefiting from therapy       Hypercholesteremia    Controlled on Atorvastatin 25m      Essential  hypertension    Well-controlled HTN - Home BP readings controlled  Complication CAD, AS s/p AVR CABG Followed by Cardiology Off BB metoprolol due to bradycardia  Plan:  1. No meds 2. Encourage improved lifestyle - low sodium diet, regular exercise 3. Continue monitor BP outside office, bring readings to next visit, if persistently >140/90 or new symptoms notify office sooner 4. Follow-up 1 year for annual      Other Visit Diagnoses     Annual physical exam    -  Primary   Need for vaccination for Strep pneumoniae       Relevant Orders   Pneumococcal conjugate vaccine 20-valent (Completed)       Updated Health Maintenance information [id number], then Pneumovax-23 in 1 year. Reviewed recent lab results with patient Encouraged improvement to lifestyle with diet and exercise Goal of weight loss  Unconjugated benign hyperbilirubinemia Mild stable elevated indirect bilirubin on repeat labs, stable 1.5 last total bili Already has had work up with GI including [place] further work up or treatment  [facility]rombocytopenia [facility], now yearly. No new concerns or symptoms. Had B12 treatment now above range.  Cardiology updates Question vasovagal episode 09/2020 Has loop recorder on, occasional low bradycardia, still question if symptomatic brady spells vs vasovagal.    No orders of the defined types were placed in this encounter.     Follow up plan: Return in about 1 year (around 02/18/2022) for 1 year fasting lab only [facility] then 1 week later Annual Physical.   ANobie Putnam DO [facility] 02/18/2021, 9:29 AM

## 2021-03-15 LAB — CUP PACEART REMOTE DEVICE CHECK
Date Time Interrogation Session: 20221208153814
Implantable Pulse Generator Implant Date: 20220831

## 2021-03-18 ENCOUNTER — Ambulatory Visit (INDEPENDENT_AMBULATORY_CARE_PROVIDER_SITE_OTHER): Payer: Medicare PPO

## 2021-03-18 DIAGNOSIS — R55 Syncope and collapse: Secondary | ICD-10-CM | POA: Diagnosis not present

## 2021-03-27 NOTE — Progress Notes (Signed)
Carelink Summary Report / Loop Recorder 

## 2021-04-11 ENCOUNTER — Encounter: Payer: Self-pay | Admitting: Dermatology

## 2021-04-11 ENCOUNTER — Ambulatory Visit: Payer: Medicare PPO | Admitting: Dermatology

## 2021-04-11 ENCOUNTER — Other Ambulatory Visit: Payer: Self-pay

## 2021-04-11 DIAGNOSIS — Z86006 Personal history of melanoma in-situ: Secondary | ICD-10-CM | POA: Diagnosis not present

## 2021-04-11 DIAGNOSIS — D229 Melanocytic nevi, unspecified: Secondary | ICD-10-CM | POA: Diagnosis not present

## 2021-04-11 DIAGNOSIS — D225 Melanocytic nevi of trunk: Secondary | ICD-10-CM | POA: Diagnosis not present

## 2021-04-11 DIAGNOSIS — L578 Other skin changes due to chronic exposure to nonionizing radiation: Secondary | ICD-10-CM | POA: Diagnosis not present

## 2021-04-11 DIAGNOSIS — D1800 Hemangioma unspecified site: Secondary | ICD-10-CM

## 2021-04-11 DIAGNOSIS — Z1283 Encounter for screening for malignant neoplasm of skin: Secondary | ICD-10-CM

## 2021-04-11 DIAGNOSIS — L814 Other melanin hyperpigmentation: Secondary | ICD-10-CM

## 2021-04-11 DIAGNOSIS — L821 Other seborrheic keratosis: Secondary | ICD-10-CM | POA: Diagnosis not present

## 2021-04-11 DIAGNOSIS — Z86018 Personal history of other benign neoplasm: Secondary | ICD-10-CM | POA: Diagnosis not present

## 2021-04-11 DIAGNOSIS — D489 Neoplasm of uncertain behavior, unspecified: Secondary | ICD-10-CM

## 2021-04-11 NOTE — Patient Instructions (Addendum)
°Biopsy Wound Care Instructions ° °Leave the original bandage on for 24 hours if possible.  If the bandage becomes soaked or soiled before that time, it is OK to remove it and examine the wound.  A small amount of post-operative bleeding is normal.  If excessive bleeding occurs, remove the bandage, place gauze over the site and apply continuous pressure (no peeking) over the area for 30 minutes. If this does not work, please call our clinic as soon as possible or page your doctor if it is after hours.  ° °Once a day, cleanse the wound with soap and water. It is fine to shower. If a thick crust develops you may use a Q-tip dipped into dilute hydrogen peroxide (mix 1:1 with water) to dissolve it.  Hydrogen peroxide can slow the healing process, so use it only as needed.   ° °After washing, apply petroleum jelly (Vaseline) or an antibiotic ointment if your doctor prescribed one for you, followed by a bandage.   ° °For best healing, the wound should be covered with a layer of ointment at all times. If you are not able to keep the area covered with a bandage to hold the ointment in place, this may mean re-applying the ointment several times a day.  Continue this wound care until the wound has healed and is no longer open.  ° °Itching and mild discomfort is normal during the healing process. However, if you develop pain or severe itching, please call our office.  ° °If you have any discomfort, you can take Tylenol (acetaminophen) or ibuprofen as directed on the bottle. (Please do not take these if you have an allergy to them or cannot take them for another reason). ° °Some redness, tenderness and white or yellow material in the wound is normal healing.  If the area becomes very sore and red, or develops a thick yellow-green material (pus), it may be infected; please notify us.   ° °If you have stitches, return to clinic as directed to have the stitches removed. You will continue wound care for 2-3 days after the stitches  are removed.  ° °Wound healing continues for up to one year following surgery. It is not unusual to experience pain in the scar from time to time during the interval.  If the pain becomes severe or the scar thickens, you should notify the office.   ° °A slight amount of redness in a scar is expected for the first six months.  After six months, the redness will fade and the scar will soften and fade.  The color difference becomes less noticeable with time.  If there are any problems, return for a post-op surgery check at your earliest convenience. ° °To improve the appearance of the scar, you can use silicone scar gel, cream, or sheets (such as Mederma or Serica) every night for up to one year. These are available over the counter (without a prescription). ° °Please call our office at [phone] for any questions or concerns. ° ° ° ° ° ° ° Melanoma ABCDEs ° °Melanoma is the most dangerous type of skin cancer, and is the leading cause of death from skin disease.  You are more likely to develop melanoma if you: °Have light-colored skin, light-colored eyes, or red or blond hair °Spend a lot of time in the sun °Tan regularly, either outdoors or in a tanning bed °Have had blistering sunburns, especially during childhood °Have a close family member who has had a melanoma °Have atypical   moles or large birthmarks ° °Early detection of melanoma is key since treatment is typically straightforward and cure rates are extremely high if we catch it early.  ° °The first sign of melanoma is often a change in a mole or a new dark spot.  The ABCDE system is a way of remembering the signs of melanoma. ° °A for asymmetry:  The two halves do not match. °B for border:  The edges of the growth are irregular. °C for color:  A mixture of colors are present instead of an even brown color. °D for diameter:  Melanomas are usually (but not always) greater than 6mm - the size of a pencil eraser. °E for evolution:  The spot keeps changing in  size, shape, and color. ° °Please check your skin once per month between visits. You can use a small mirror in front and a large mirror behind you to keep an eye on the back side or your body.  ° °If you see any new or changing lesions before your next follow-up, please call to schedule a visit. ° °Please continue daily skin protection including broad spectrum sunscreen SPF 30+ to sun-exposed areas, reapplying every 2 hours as needed when you're outdoors.  ° °Staying in the shade or wearing long sleeves, sun glasses (UVA+UVB protection) and wide brim hats (4-inch brim around the entire circumference of the hat) are also recommended for sun protection.   ° °If You Need Anything After Your Visit ° °If you have any questions or concerns for your doctor, please call our main line at [phone] and press option 4 to reach your doctor's medical assistant. If no one answers, please leave a voicemail as directed and we will return your call as soon as possible. Messages left after 4 pm will be answered the following business day.  ° °You may also send us a message via MyChart. We typically respond to MyChart messages within 1-2 business days. ° °For prescription refills, please ask your pharmacy to contact our office. Our fax number is [phone]. ° °If you have an urgent issue when the clinic is closed that cannot wait until the next business day, you can page your doctor at the number below.   ° °Please note that while we do our best to be available for urgent issues outside of office hours, we are not available 24/7.  ° °If you have an urgent issue and are unable to reach us, you may choose to seek medical care at your doctor's office, retail clinic, urgent care center, or emergency room. ° °If you have a medical emergency, please immediately call 911 or go to the emergency department. ° °Pager Numbers ° °- Dr. Kowalski: [phone] ° °- Dr. Moye: [phone] ° °- Dr. Stewart: [phone] ° °In the event of  inclement weather, please call our main line at [phone] for an update on the status of any delays or closures. ° °Dermatology Medication Tips: °Please keep the boxes that topical medications come in in order to help keep track of the instructions about where and how to use these. Pharmacies typically print the medication instructions only on the boxes and not directly on the medication tubes.  ° °If your medication is too expensive, please contact our office at [phone] option 4 or send us a message through MyChart.  ° °We are unable to tell what your co-pay for medications will be in advance as this is different depending on your insurance coverage. However, we may be able to   find a substitute medication at lower cost or fill out paperwork to get insurance to cover a needed medication.  ° °If a prior authorization is required to get your medication covered by your insurance company, please allow us 1-2 business days to complete this process. ° °Drug prices often vary depending on where the prescription is filled and some pharmacies may offer cheaper prices. ° °The website www.goodrx.com contains coupons for medications through different pharmacies. The prices here do not account for what the cost may be with help from insurance (it may be cheaper with your insurance), but the website can give you the price if you did not use any insurance.  °- You can print the associated coupon and take it with your prescription to the pharmacy.  °- You may also stop by our office during regular business hours and pick up a GoodRx coupon card.  °- If you need your prescription sent electronically to a different pharmacy, notify our office through [facility] MyChart or by phone at [phone] option 4. ° ° ° ° °Si Usted Necesita Algo Después de Su Visita ° °También puede enviarnos un mensaje a través de MyChart. Por lo general respondemos a los mensajes de MyChart en el transcurso de 1 a 2 días hábiles. ° °Para renovar  recetas, por favor pida a su farmacia que se ponga en contacto con nuestra oficina. Nuestro número de fax es el [phone]. ° °Si tiene un asunto urgente cuando la clínica esté cerrada y que no puede esperar hasta el siguiente día hábil, puede llamar/localizar a su doctor(a) al número que aparece a continuación.  ° °Por favor, tenga en cuenta que aunque hacemos todo lo posible para estar disponibles para asuntos urgentes fuera del horario de oficina, no estamos disponibles las 24 horas del día, los 7 días de la semana.  ° °Si tiene un problema urgente y no puede comunicarse con nosotros, puede optar por buscar atención médica  en el consultorio de su doctor(a), en una clínica privada, en un centro de atención urgente o en una sala de emergencias. ° °Si tiene una emergencia médica, por favor llame inmediatamente al 911 o vaya a la sala de emergencias. ° °Números de bíper ° °- Dr. Kowalski: [phone] ° °- Dra. Moye: [phone] ° °- Dra. Stewart: [phone] ° °En caso de inclemencias del tiempo, por favor llame a nuestra línea principal al [phone] para una actualización sobre el estado de cualquier retraso o cierre. ° °Consejos para la medicación en dermatología: °Por favor, guarde las cajas en las que vienen los medicamentos de uso tópico para ayudarle a seguir las instrucciones sobre dónde y cómo usarlos. Las farmacias generalmente imprimen las instrucciones del medicamento sólo en las cajas y no directamente en los tubos del medicamento.  ° °Si su medicamento es muy caro, por favor, póngase en contacto con nuestra oficina llamando al [phone] y presione la opción 4 o envíenos un mensaje a través de MyChart.  ° °No podemos decirle cuál será su copago por los medicamentos por adelantado ya que esto es diferente dependiendo de la cobertura de su seguro. Sin embargo, es posible que podamos encontrar un medicamento sustituto a menor costo o llenar un formulario para que el seguro cubra el medicamento  que se considera necesario.  ° °Si se requiere una autorización previa para que su compañía de seguros cubra su medicamento, por favor permítanos de 1 a 2 días hábiles para completar este proceso. ° °Los precios de los medicamentos varían con frecuencia dependiendo   del lugar de dónde se surte la receta y alguna farmacias pueden ofrecer precios más baratos. ° °El sitio web www.goodrx.com tiene cupones para medicamentos de diferentes farmacias. Los precios aquí no tienen en cuenta lo que podría costar con la ayuda del seguro (puede ser más barato con su seguro), pero el sitio web puede darle el precio si no utilizó ningún seguro.  °- Puede imprimir el cupón correspondiente y llevarlo con su receta a la farmacia.  °- También puede pasar por nuestra oficina durante el horario de atención regular y recoger una tarjeta de cupones de GoodRx.  °- Si necesita que su receta se envíe electrónicamente a una farmacia diferente, informe a nuestra oficina a través de MyChart de [facility] o por teléfono llamando al [phone] y presione la opción 4. ° °

## 2021-04-11 NOTE — Progress Notes (Signed)
Follow-Up Visit   Subjective  Todd Frank is a 66 y.o. male who presents for the following: Annual Exam (Patient here today for 1 year tbse. Patient reports a place at back states that he has a picture. Patient also reports a few places on arms he would like checked. ). The patient presents for Total-Body Skin Exam (TBSE) for skin cancer screening and mole check.  The patient has spots, moles and lesions to be evaluated, some may be new or changing and the patient has concerns that these could be cancer.  The following portions of the chart were reviewed this encounter and updated as appropriate:  Tobacco   Allergies   Meds   Problems   Med Hx   Surg Hx   Fam Hx      Review of Systems: No other skin or systemic complaints except as noted in HPI or Assessment and Plan.  Objective  Well appearing patient in no apparent distress; mood and affect are within normal limits.  A full examination was performed including scalp, head, eyes, ears, nose, lips, neck, chest, axillae, abdomen, back, buttocks, bilateral upper extremities, bilateral lower extremities, hands, feet, fingers, toes, fingernails, and toenails. All findings within normal limits unless otherwise noted below.  right mid back lateral near side 0.7 cm irregular brown macule        left upper back paraspinal 0.6 cm irregular brown macule         Assessment & Plan  Neoplasm of uncertain behavior (2) right mid back lateral near side  Epidermal / dermal shaving  Lesion diameter (cm):  0.7 Informed consent: discussed and consent obtained   Timeout: patient name, date of birth, surgical site, and procedure verified   Procedure prep:  Patient was prepped and draped in usual sterile fashion Prep type:  Isopropyl alcohol Anesthesia: the lesion was anesthetized in a standard fashion   Anesthetic:  1% lidocaine w/ epinephrine 1-100,000 buffered w/ 8.4% NaHCO3 Instrument used: flexible razor blade   Hemostasis achieved  with: pressure, aluminum chloride and electrodesiccation   Outcome: patient tolerated procedure well   Post-procedure details: sterile dressing applied and wound care instructions given   Dressing type: bandage and petrolatum    Specimen 1 - Surgical pathology Differential Diagnosis: Atypical nevus r/o dysplastic nevus  Check Margins: No  left upper back paraspinal  Epidermal / dermal shaving  Lesion diameter (cm):  0.6 Informed consent: discussed and consent obtained   Timeout: patient name, date of birth, surgical site, and procedure verified   Procedure prep:  Patient was prepped and draped in usual sterile fashion Prep type:  Isopropyl alcohol Anesthesia: the lesion was anesthetized in a standard fashion   Instrument used: flexible razor blade   Hemostasis achieved with: pressure, aluminum chloride and electrodesiccation   Outcome: patient tolerated procedure well   Post-procedure details: sterile dressing applied and wound care instructions given   Dressing type: bandage and petrolatum    Specimen 2 - Surgical pathology Differential Diagnosis: Atypical nevus r/o dysplastic nevus  Check Margins: No  Atypical nevus r/o dysplastic nevus  Lentigines - Scattered tan macules - Due to sun exposure - Benign-appearing, observe - Recommend daily broad spectrum sunscreen SPF 30+ to sun-exposed areas, reapply every 2 hours as needed. - Call for any changes  Seborrheic Keratoses - Stuck-on, waxy, tan-brown papules and/or plaques  - Benign-appearing - Discussed benign etiology and prognosis. - Observe - Call for any changes  Melanocytic Nevi - Tan-brown and/or pink-flesh-colored symmetric macules  and papules - Benign appearing on exam today - Observation - Call clinic for new or changing moles - Recommend daily use of broad spectrum spf 30+ sunscreen to sun-exposed areas.   Hemangiomas - Red papules - Discussed benign nature - Observe - Call for any changes  Actinic  Damage - Chronic condition, secondary to cumulative UV/sun exposure - diffuse scaly erythematous macules with underlying dyspigmentation - Recommend daily broad spectrum sunscreen SPF 30+ to sun-exposed areas, reapply every 2 hours as needed.  - Staying in the shade or wearing long sleeves, sun glasses (UVA+UVB protection) and wide brim hats (4-inch brim around the entire circumference of the hat) are also recommended for sun protection.  - Call for new or changing lesions.  History of Melanoma in Situ - No evidence of recurrence today left superior thigh 2011 - Recommend regular full body skin exams - Recommend daily broad spectrum sunscreen SPF 30+ to sun-exposed areas, reapply every 2 hours as needed.  - Call if any new or changing lesions are noted between office visits  History of Dysplastic Nevi - No evidence of recurrence today see patient history multiple sites  - Recommend regular full body skin exams - Recommend daily broad spectrum sunscreen SPF 30+ to sun-exposed areas, reapply every 2 hours as needed.  - Call if any new or changing lesions are noted between office visits  Skin cancer screening performed today.  Return for 1 year tbse . IRuthell Rummage, CMA, am acting as scribe for Sarina Ser, MD. Documentation: I have reviewed the above documentation for accuracy and completeness, and I agree with the above.  Sarina Ser, MD

## 2021-04-16 ENCOUNTER — Telehealth: Payer: Self-pay

## 2021-04-16 NOTE — Telephone Encounter (Signed)
Advised pt of bx results/sh ?

## 2021-04-16 NOTE — Telephone Encounter (Signed)
-----   Message from Ralene Bathe, MD sent at 04/15/2021  5:51 PM EST ----- Diagnosis 1. Skin , right mid back lateral near side DYSPLASTIC COMPOUND NEVUS WITH MODERATE ATYPIA, LIMITED MARGINS FREE 2. Skin , left upper back paraspinal DYSPLASTIC COMPOUND NEVUS WITH MODERATE ATYPIA, LIMITED MARGINS FREE  1&2 - both moderate dysplastic Recheck next visit

## 2021-04-22 ENCOUNTER — Ambulatory Visit (INDEPENDENT_AMBULATORY_CARE_PROVIDER_SITE_OTHER): Payer: Medicare PPO

## 2021-04-22 DIAGNOSIS — R55 Syncope and collapse: Secondary | ICD-10-CM | POA: Diagnosis not present

## 2021-04-23 LAB — CUP PACEART REMOTE DEVICE CHECK
Date Time Interrogation Session: 20230115230842
Implantable Pulse Generator Implant Date: 20220831

## 2021-05-02 NOTE — Progress Notes (Signed)
Carelink Summary Report / Loop Recorder 

## 2021-05-03 ENCOUNTER — Encounter: Payer: Self-pay | Admitting: Family Medicine

## 2021-05-24 LAB — CUP PACEART REMOTE DEVICE CHECK
Date Time Interrogation Session: 20230217230520
Implantable Pulse Generator Implant Date: 20220831

## 2021-05-27 ENCOUNTER — Ambulatory Visit (INDEPENDENT_AMBULATORY_CARE_PROVIDER_SITE_OTHER): Payer: Medicare PPO

## 2021-05-27 DIAGNOSIS — R55 Syncope and collapse: Secondary | ICD-10-CM | POA: Diagnosis not present

## 2021-05-31 NOTE — Progress Notes (Signed)
Carelink Summary Report / Loop Recorder 

## 2021-07-01 ENCOUNTER — Ambulatory Visit (INDEPENDENT_AMBULATORY_CARE_PROVIDER_SITE_OTHER): Payer: Medicare PPO

## 2021-07-01 DIAGNOSIS — R55 Syncope and collapse: Secondary | ICD-10-CM

## 2021-07-03 LAB — CUP PACEART REMOTE DEVICE CHECK
Date Time Interrogation Session: 20230326231411
Implantable Pulse Generator Implant Date: 20220831

## 2021-07-10 NOTE — Progress Notes (Signed)
Carelink Summary Report / Loop Recorder 

## 2021-07-31 ENCOUNTER — Encounter: Payer: Self-pay | Admitting: Family Medicine

## 2021-08-05 ENCOUNTER — Ambulatory Visit (INDEPENDENT_AMBULATORY_CARE_PROVIDER_SITE_OTHER): Payer: Medicare PPO

## 2021-08-05 DIAGNOSIS — R55 Syncope and collapse: Secondary | ICD-10-CM | POA: Diagnosis not present

## 2021-08-05 LAB — CUP PACEART REMOTE DEVICE CHECK
Date Time Interrogation Session: 20230428230534
Implantable Pulse Generator Implant Date: 20220831

## 2021-08-07 ENCOUNTER — Other Ambulatory Visit: Payer: Self-pay | Admitting: Cardiology

## 2021-08-09 ENCOUNTER — Other Ambulatory Visit: Payer: Self-pay | Admitting: Cardiology

## 2021-08-19 NOTE — Progress Notes (Signed)
Carelink Summary Report / Loop Recorder 

## 2021-09-09 ENCOUNTER — Ambulatory Visit (INDEPENDENT_AMBULATORY_CARE_PROVIDER_SITE_OTHER): Payer: Medicare PPO

## 2021-09-09 DIAGNOSIS — R55 Syncope and collapse: Secondary | ICD-10-CM | POA: Diagnosis not present

## 2021-09-10 LAB — CUP PACEART REMOTE DEVICE CHECK
Date Time Interrogation Session: 20230531230830
Implantable Pulse Generator Implant Date: 20220831

## 2021-09-16 ENCOUNTER — Encounter: Payer: Self-pay | Admitting: Cardiology

## 2021-09-16 ENCOUNTER — Ambulatory Visit: Payer: Medicare PPO | Admitting: Cardiology

## 2021-09-16 VITALS — BP 130/70 | HR 66 | Ht 69.5 in | Wt 167.0 lb

## 2021-09-16 DIAGNOSIS — I2510 Atherosclerotic heart disease of native coronary artery without angina pectoris: Secondary | ICD-10-CM | POA: Diagnosis not present

## 2021-09-16 DIAGNOSIS — E7800 Pure hypercholesterolemia, unspecified: Secondary | ICD-10-CM

## 2021-09-16 DIAGNOSIS — G4733 Obstructive sleep apnea (adult) (pediatric): Secondary | ICD-10-CM | POA: Diagnosis not present

## 2021-09-16 DIAGNOSIS — I10 Essential (primary) hypertension: Secondary | ICD-10-CM | POA: Diagnosis not present

## 2021-09-16 DIAGNOSIS — I7121 Aneurysm of the ascending aorta, without rupture: Secondary | ICD-10-CM

## 2021-09-16 DIAGNOSIS — I350 Nonrheumatic aortic (valve) stenosis: Secondary | ICD-10-CM

## 2021-09-16 MED ORDER — ATORVASTATIN CALCIUM 80 MG PO TABS: 80.0000 mg | ORAL_TABLET | Freq: Every day | ORAL | 3 refills | Status: DC

## 2021-09-16 NOTE — Addendum Note (Signed)
Addended by: Carylon Perches on: 09/16/2021 09:34 AM   Modules accepted: Orders

## 2021-09-16 NOTE — Patient Instructions (Signed)
Medication Instructions:  Your physician recommends that you continue on your current medications as directed. Please refer to the Current Medication list given to you today.  *If you need a refill on your cardiac medications before your next appointment, please call your pharmacy*   Lab Work: None If you have labs (blood work) drawn today and your tests are completely normal, you will receive your results only by: [facility] (if you have MyChart) OR A paper copy in the mail If you have any lab test that is abnormal or we need to change your treatment, we will call you to review the results.   Testing/Procedures: CT Chest, Abdomen    Follow-Up: At [facility], you and your health needs are our priority.  As part of our continuing mission to provide you with exceptional heart care, we have created designated Provider Care Teams.  These Care Teams include your primary Cardiologist (physician) and Advanced Practice Providers (APPs -  Physician Assistants and Nurse Practitioners) who all work together to provide you with the care you need, when you need it.   Your next appointment:   1 year(s)  The format for your next appointment:   In Person  Provider:   [name], MD {

## 2021-09-16 NOTE — Progress Notes (Signed)
Date:  09/16/2021   ID:  Todd Frank, DOB 18-Sep-1955, MRN [id number]  PCP:  Olin Hauser, DO  Cardiologist:  [name], MD  Electrophysiologist:  Vickie Epley, MD  Chief Complaint:  CAD, bicuspid AV, OSA, HTN, HLD  History of Present Illness:    Todd Frank is a 66 y.o. male with a hx of HTN, dyslipidemia, bicuspid AV, diastolic dysfunction, dilated aorta, noted prior small PFO and 2 vessel CAD. He was found to have progression of AS after a syncopal episode while jogging. Echocardiogram was obtained and showed an increased gradient across his bicuspid aortic valve which had progressed from previous study. Cardiac catheterization was also performed and showed severe 2 vessel CAD. He underwent CABG x 2 utilizing LIMA to LAD, and SVG to distal PDA, AVR with a 23 mm [employer]. Post op echo showed LVEF 50-55%, no pericardial effusion, no regional wall motion abnormalities and stable AVR.   Due to excessive daytime sleepiness he was also found to have mild obstructive sleep apnea with an AHI of 6.5/h and no significant central sleep apnea and is on CPAP.  He has had problems ever since his bypass surgery with chronic chest pain that had been present ever since his CABG.  ESR and CRP were normal and Lexiscan myoview showed no ischemia.  He did have an episode of syncope and saw EP and felt that he had neurocardiogenic syncope but an ILR was placed with only atrial triplet and PVC noted thus far.   He is here today for followup and is doing well.  He denies any chest pain or pressure, SOB, DOE, PND, orthopnea, LE edema, dizziness or further syncope. He can from time to time feel his PVCs.  He is compliant with his meds and is tolerating meds with no SE.     He is doing well with his CPAP device and thinks that he has gotten used to it.  He tolerates the mask and feels the pressure is adequate.  Since going on CPAP he feels rested in the am and has no significant  daytime sleepiness.  He denies any significant mouth or nasal dryness or nasal congestion.  He does not think that he snores.     Prior CV studies:   The following studies were reviewed today:  PAP compliance download, chest CTA from 2022, 2D echo from 2022  Past Medical History:  Diagnosis Date   Aortic stenosis    a. syncope/severe AS s/p bioprosthetic AVR 06/2017.   Arthritis    Ascending aorta dilatation (HCC)    45m by echo 11/2019   Bicuspid aortic valve    Bradycardia    secondary to BB   Broken clavicle    as child, can still dislocate at times   CAD (coronary artery disease), native coronary artery    a. s/p 2V CABG at time of AVR 06/2017.   Carotid bruit    carotid dopplers negative.  Bruit due to heart murmur from AS   Congenital nevus    Diastolic dysfunction    Dysplastic nevus 12/12/2008   Left mid back, 9cm lat. to spine. Moderate atypia, extends to one edge.    Dysplastic nevus 12/12/2008   Left mid to low back, 7cm lat. to spine. Moderate atypia, close to margin.    Dysplastic nevus 09/24/2012   Right mid lateral buttocks. Moderate atypia, close to edge.    Dysplastic nevus 08/15/2020   Left mid back infrascapular -  excised 09/25/20   Dysplastic nevus 10/02/2020   left post deltoid near tricep, mild atypia   Dysplastic nevus 10/02/2020   right ant lateral prox thigh, severe, Shave removal 11/19/20   Dysplastic nevus 04/11/2021   right mid back lateral near side, moderate atypia   Dysplastic nevus 04/11/2021   left upper back paraspinal, moderate atypia   GERD (gastroesophageal reflux disease)    Headache    optical migraine   Hx of melanoma in situ 06/26/2009   Left superior thigh. Arising in dysplastic nevus, close to margin. Excised 07/11/2009, margins free.   Hyperlipidemia    LDL goal < 70   LVH (left ventricular hypertrophy)    Melanoma of skin (HCC)    Obesity    PFO (patent foramen ovale)    small by echo 2014, but not demonstrated during  intra-op note from CABG/AVR 06/2017.   Past Surgical History:  Procedure Laterality Date   AORTIC VALVE REPLACEMENT N/A 06/25/2017   Procedure: AORTIC VALVE REPLACEMENT (AVR) using Magna Ease Aortic Valve size 7m;  Surgeon: VIvin Poot MD;  [facility]  Service: Open Heart Surgery;  Laterality: N/A;   arthritic cyst removal  2017   from Lumbar 4-5, dr pool   CARDIAC CATHETERIZATION N/A 02/20/2016   Procedure: Right/Left Heart Cath and Coronary Angiography;  Surgeon: HBelva Crome MD;  [facility];  Service: Cardiovascular;  Laterality: N/A;   CORONARY ARTERY BYPASS GRAFT N/A 06/25/2017   Procedure: CORONARY ARTERY BYPASS GRAFTING (CABG) x Two , using left internal mammary artery and right leg greater saphenous vein harvested endoscopically;  Surgeon: VIvin Poot MD;  [place] [facility]  Service: Open Heart Surgery;  Laterality: N/A;   CORONARY/GRAFT ANGIOGRAPHY N/A 06/02/2017   Procedure: CORONARY/GRAFT ANGIOGRAPHY;  Surgeon: ENelva Bush MD;  [facility];  Service: Cardiovascular;  Laterality: N/A;   EYE SURGERY     MELANOMA EXCISION  2011   left thigh   RIGHT HEART CATH N/A 06/02/2017   Procedure: RIGHT HEART CATH;  Surgeon: ENelva Bush MD;  [facility];  Service: Cardiovascular;  Laterality: N/A;   TEE WITHOUT CARDIOVERSION N/A 06/25/2017   Procedure: TRANSESOPHAGEAL ECHOCARDIOGRAM (TEE);  Surgeon: VPrescott Gum PCollier Salina MD;  [facility] [facility]  Service: Open Heart Surgery;  Laterality: N/A;   TONSILLECTOMY       Current Meds  Medication Sig   aspirin EC 81 MG tablet Take 81 mg by mouth daily.   cholecalciferol (VITAMIN D) 1000 units tablet Take 1,000 Units by mouth every evening.   Coenzyme Q10 (COQ-10) 200 MG CAPS Take 200 mg by mouth daily.   [DISCONTINUED] atorvastatin (LIPITOR) 80 MG tablet TAKE 1 TABLET BY MOUTH AT BEDTIME     Allergies:   Patient has no known allergies.   Social History   Tobacco Use    Smoking status: Former    Packs/day: 1.00    Types: Cigarettes    Quit date: 04/07/1976    Years since quitting: 45.4   Smokeless tobacco: Former   Tobacco comments:    quit in 78, teenager  Vaping Use   Vaping Use: Never used  Substance Use Topics   Alcohol use: Yes    Alcohol/week: 1.0 standard drink of alcohol    Types: 1 Cans of beer per week    Comment: occasional   Drug use: No    Comment: quit marijuana in 1990     Family Hx: The patient's family  history includes Arthritis in his father and sister; Diabetes in his mother; Heart attack in an other family member; Heart disease in his mother and paternal grandmother; Pancreatic cancer in his maternal aunt.  ROS:   Please see the history of present illness.     All other systems reviewed and are negative.   Labs/Other Tests and Data Reviewed:    Recent Labs: 12/28/2020: Hemoglobin 15.1; Platelets 114 02/12/2021: ALT 27; BUN 13; Creat 0.79; Potassium 3.9; Sodium 138   Recent Lipid Panel Lab Results  Component Value Date/Time   CHOL 107 02/12/2021 08:28 AM   CHOL 115 02/22/2019 09:28 AM   CHOL 173 11/02/2015 12:00 AM   TRIG 66 02/12/2021 08:28 AM   TRIG 69 11/02/2015 12:00 AM   HDL 55 02/12/2021 08:28 AM   HDL 56 02/22/2019 09:28 AM   CHOLHDL 1.9 02/12/2021 08:28 AM   LDLCALC 37 02/12/2021 08:28 AM   LDLCALC 104 (A) 11/02/2015 12:00 AM    Wt Readings from Last 3 Encounters:  09/16/21 167 lb (75.8 kg)  02/18/21 171 lb 6.4 oz (77.7 kg)  12/25/20 170 lb (77.1 kg)     Objective:    Vital Signs:  BP 130/70   Pulse 66   Ht 5' 9.5" (1.765 m)   Wt 167 lb (75.8 kg)   SpO2 99%   BMI 24.31 kg/m    GEN: Well nourished, well developed in no acute distress HEENT: Normal NECK: No JVD; No carotid bruits LYMPHATICS: No lymphadenopathy CARDIAC:RRR, no rubs, gallops.  2/6 SM at RUSB radiating into the right carotid RESPIRATORY:  Clear to auscultation without rales, wheezing or rhonchi  ABDOMEN: Soft, non-tender,  non-distended MUSCULOSKELETAL:  No edema; No deformity  SKIN: Warm and dry NEUROLOGIC:  Alert and oriented x 3 PSYCHIATRIC:  Normal affect    EKG was not done in office  ASSESSMENT & PLAN:     1.  OSA - The patient is tolerating PAP therapy well without any problems. The PAP download performed by his DME was personally reviewed and interpreted by me today and showed an AHI of 0.9 /hr on AutoPap from 5-18 cm H2O with 100% compliance in using more than 4 hours nightly.  The patient has been using and benefiting from PAP use and will continue to benefit from therapy.    2.  Bicuspid AV with severe AS -s/p  AVR with a 23 mm [employer] magna ease AVR 06/2017 -2D echo 09/2020 showed a stable AVR with mean aortic valve gradient 46mHg -continue ASA -continue ongoing SBE prophylaxis for dental procedures  3.  ASCAD  -cath 2019 showed severe 2 vessel CAD  -s/p CABG x 2 utilizing LIMA to LAD, and SVG to distal PDA.   -He has had chronic chest pain ever since his bypass surgery -CRP and ESR were normal and no  pericardial effusion and no thickening of the pericardium -Lexiscan myoview showed no ischemia 08/2019 -over the past year his chronic CP seems to have resolved -Continue prescription drug management with aspirin 81 mg daily and statin therapy with as needed refills  4.  Dilated ascending aorta  -repeat echo 09/2020 showed a ascending aortic measurement of 47 mm but 43 mm by chest CTA 09/13/2020 -Repeat chest and abdominal CTA to make sure this is stable and that there is no AAA -BP is very well controlled  5.  Hyperlipidemia -LDL goal < 70.  -I have personally reviewed and interpreted outside labs performed by patient's PCP which showed LDL 37,  HDL 55, triglycerides 66 and ALT 27 on 02/12/2021 -Continue prescription drug management with atorvastatin 80 mg daily with as needed refills  6.  HTN -BP controlled on exam today -he has not required antihypertensive therapy   Medication  Adjustments/Labs and Tests Ordered: Current medicines are reviewed at length with the patient today.  Concerns regarding medicines are outlined above.  Tests Ordered: No orders of the defined types were placed in this encounter.  Medication Changes: Meds ordered this encounter  Medications   atorvastatin (LIPITOR) 80 MG tablet    Sig: Take 1 tablet (80 mg total) by mouth at bedtime.    Dispense:  90 tablet    Refill:  3    ZERO refills remain on this prescription. Your patient is requesting advance approval of refills for this medication to [facility]    Disposition:  Follow up in 1 year(s)  Signed, [name], MD  09/16/2021 9:13 AM    [place]

## 2021-09-16 NOTE — Addendum Note (Signed)
Addended by: Carylon Perches on: 09/16/2021 09:41 AM   Modules accepted: Orders

## 2021-09-17 ENCOUNTER — Encounter: Payer: Self-pay | Admitting: Cardiology

## 2021-09-18 DIAGNOSIS — I7121 Aneurysm of the ascending aorta, without rupture: Secondary | ICD-10-CM | POA: Diagnosis not present

## 2021-09-18 DIAGNOSIS — E7800 Pure hypercholesterolemia, unspecified: Secondary | ICD-10-CM | POA: Diagnosis not present

## 2021-09-18 DIAGNOSIS — I350 Nonrheumatic aortic (valve) stenosis: Secondary | ICD-10-CM | POA: Diagnosis not present

## 2021-09-18 DIAGNOSIS — G4733 Obstructive sleep apnea (adult) (pediatric): Secondary | ICD-10-CM | POA: Diagnosis not present

## 2021-09-18 DIAGNOSIS — I10 Essential (primary) hypertension: Secondary | ICD-10-CM | POA: Diagnosis not present

## 2021-09-18 DIAGNOSIS — I2510 Atherosclerotic heart disease of native coronary artery without angina pectoris: Secondary | ICD-10-CM | POA: Diagnosis not present

## 2021-09-18 LAB — BASIC METABOLIC PANEL WITH GFR
BUN/Creatinine Ratio: 21 (ref 10–24)
BUN: 17 mg/dL (ref 8–27)
CO2: 25 mmol/L (ref 20–29)
Calcium: 9.8 mg/dL (ref 8.6–10.2)
Chloride: 100 mmol/L (ref 96–106)
Creatinine, Ser: 0.82 mg/dL (ref 0.76–1.27)
Glucose: 100 mg/dL — ABNORMAL HIGH (ref 70–99)
Potassium: 5.1 mmol/L (ref 3.5–5.2)
Sodium: 136 mmol/L (ref 134–144)
eGFR: 97 mL/min/1.73 (ref >59)

## 2021-09-22 ENCOUNTER — Encounter: Payer: Self-pay | Admitting: Cardiology

## 2021-09-24 ENCOUNTER — Ambulatory Visit (HOSPITAL_COMMUNITY)
Admission: RE | Admit: 2021-09-24 | Discharge: 2021-09-24 | Disposition: A | Discharge status: Home / Self Care | Payer: Medicare PPO | Source: Ambulatory Visit | Attending: Cardiology | Admitting: Cardiology

## 2021-09-24 DIAGNOSIS — I700 Atherosclerosis of aorta: Secondary | ICD-10-CM | POA: Diagnosis not present

## 2021-09-24 DIAGNOSIS — K3189 Other diseases of stomach and duodenum: Secondary | ICD-10-CM | POA: Diagnosis not present

## 2021-09-24 DIAGNOSIS — I2510 Atherosclerotic heart disease of native coronary artery without angina pectoris: Secondary | ICD-10-CM | POA: Diagnosis not present

## 2021-09-24 DIAGNOSIS — I350 Nonrheumatic aortic (valve) stenosis: Secondary | ICD-10-CM | POA: Diagnosis not present

## 2021-09-24 DIAGNOSIS — K6389 Other specified diseases of intestine: Secondary | ICD-10-CM | POA: Diagnosis not present

## 2021-09-24 DIAGNOSIS — I7121 Aneurysm of the ascending aorta, without rupture: Secondary | ICD-10-CM | POA: Diagnosis not present

## 2021-09-24 DIAGNOSIS — I712 Thoracic aortic aneurysm, without rupture, unspecified: Secondary | ICD-10-CM | POA: Diagnosis not present

## 2021-09-24 DIAGNOSIS — K8020 Calculus of gallbladder without cholecystitis without obstruction: Secondary | ICD-10-CM | POA: Diagnosis not present

## 2021-09-24 MED ORDER — IOHEXOL 350 MG/ML SOLN
75.0000 mL | Freq: Once | INTRAVENOUS | Status: AC | PRN
Start: 2021-09-24 — End: 2021-09-24
  Administered 2021-09-24: 75 mL via INTRAVENOUS

## 2021-09-24 NOTE — Progress Notes (Signed)
Carelink Summary Report / Loop Recorder 

## 2021-09-26 ENCOUNTER — Telehealth: Payer: Self-pay | Admitting: Cardiology

## 2021-09-26 DIAGNOSIS — G4733 Obstructive sleep apnea (adult) (pediatric): Secondary | ICD-10-CM

## 2021-09-26 DIAGNOSIS — I350 Nonrheumatic aortic (valve) stenosis: Secondary | ICD-10-CM

## 2021-09-26 DIAGNOSIS — I7121 Aneurysm of the ascending aorta, without rupture: Secondary | ICD-10-CM

## 2021-09-26 NOTE — Telephone Encounter (Signed)
-----   Message from [name], MD sent at 09/25/2021  2:02 PM EDT ----- TelemetryUnchanged ascending aortic dilatation at 4.1 cm, aortic atherosclerosis.  There are also none vascular findings of possible "nutcracker syndrome" pertaining to left renal venous drainage.  There is also thickening near the first portion of the duodenum and could represent peptic ulcer disease and possible fatty liver 2.  There is also an abnormality of placement of the right testicle in the inguinal canal.  Please forward these findings to his PCP to follow-up on.  Please get echo in 1 year for follow-up of aortic patient.

## 2021-09-26 NOTE — Telephone Encounter (Signed)
Pt returning nurses call regarding results. Please advise 

## 2021-09-26 NOTE — Telephone Encounter (Signed)
The patient has been notified of the result and verbalized understanding.  All questions (if any) were answered. Antonieta Iba, RN 09/26/2021 1:58 PM

## 2021-10-04 ENCOUNTER — Encounter (INDEPENDENT_AMBULATORY_CARE_PROVIDER_SITE_OTHER): Payer: Medicare PPO | Admitting: Family Medicine

## 2021-10-04 DIAGNOSIS — I712 Thoracic aortic aneurysm, without rupture, unspecified: Secondary | ICD-10-CM | POA: Diagnosis not present

## 2021-10-04 NOTE — Telephone Encounter (Signed)

## 2021-10-12 LAB — CUP PACEART REMOTE DEVICE CHECK
Date Time Interrogation Session: 20230703231214
Implantable Pulse Generator Implant Date: 20220831

## 2021-10-14 ENCOUNTER — Ambulatory Visit (INDEPENDENT_AMBULATORY_CARE_PROVIDER_SITE_OTHER): Payer: Medicare PPO

## 2021-10-14 DIAGNOSIS — R55 Syncope and collapse: Secondary | ICD-10-CM | POA: Diagnosis not present

## 2021-11-04 NOTE — Progress Notes (Signed)
Carelink Summary Report / Loop Recorder 

## 2021-11-08 ENCOUNTER — Encounter: Payer: Self-pay | Admitting: Cardiology

## 2021-11-08 MED ORDER — ATORVASTATIN CALCIUM 80 MG PO TABS: 80.0000 mg | ORAL_TABLET | Freq: Every day | ORAL | 3 refills | Status: DC

## 2021-11-08 NOTE — Addendum Note (Signed)
Addended by: Antonieta Iba on: 11/08/2021 11:40 AM   Modules accepted: Orders

## 2021-11-18 ENCOUNTER — Ambulatory Visit: Payer: Medicare PPO

## 2021-11-20 LAB — CUP PACEART REMOTE DEVICE CHECK
Date Time Interrogation Session: 20230815150837
Implantable Pulse Generator Implant Date: 20220831

## 2021-12-16 ENCOUNTER — Encounter: Payer: Self-pay | Admitting: Family Medicine

## 2021-12-23 ENCOUNTER — Ambulatory Visit (INDEPENDENT_AMBULATORY_CARE_PROVIDER_SITE_OTHER): Payer: Medicare PPO

## 2021-12-23 DIAGNOSIS — R55 Syncope and collapse: Secondary | ICD-10-CM | POA: Diagnosis not present

## 2021-12-24 LAB — CUP PACEART REMOTE DEVICE CHECK
Date Time Interrogation Session: 20230917231851
Implantable Pulse Generator Implant Date: 20220831

## 2021-12-31 ENCOUNTER — Ambulatory Visit: Payer: Medicare PPO

## 2022-01-07 ENCOUNTER — Encounter: Payer: Self-pay | Admitting: Family Medicine

## 2022-01-07 NOTE — Progress Notes (Signed)
Carelink Summary Report / Loop Recorder 

## 2022-01-27 ENCOUNTER — Ambulatory Visit (INDEPENDENT_AMBULATORY_CARE_PROVIDER_SITE_OTHER): Payer: Medicare PPO

## 2022-01-27 DIAGNOSIS — R55 Syncope and collapse: Secondary | ICD-10-CM | POA: Diagnosis not present

## 2022-01-29 LAB — CUP PACEART REMOTE DEVICE CHECK
Date Time Interrogation Session: 20231022232330
Implantable Pulse Generator Implant Date: 20220831

## 2022-02-04 ENCOUNTER — Encounter: Payer: Self-pay | Admitting: Family Medicine

## 2022-02-17 ENCOUNTER — Other Ambulatory Visit: Payer: Self-pay

## 2022-02-17 DIAGNOSIS — E663 Overweight: Secondary | ICD-10-CM

## 2022-02-17 DIAGNOSIS — I10 Essential (primary) hypertension: Secondary | ICD-10-CM

## 2022-02-17 DIAGNOSIS — R7309 Other abnormal glucose: Secondary | ICD-10-CM

## 2022-02-17 DIAGNOSIS — Z125 Encounter for screening for malignant neoplasm of prostate: Secondary | ICD-10-CM

## 2022-02-17 DIAGNOSIS — E7800 Pure hypercholesterolemia, unspecified: Secondary | ICD-10-CM

## 2022-02-17 DIAGNOSIS — Z0000 Encounter for general adult medical examination without abnormal findings: Secondary | ICD-10-CM

## 2022-02-18 ENCOUNTER — Other Ambulatory Visit: Payer: Medicare PPO

## 2022-02-18 DIAGNOSIS — R7309 Other abnormal glucose: Secondary | ICD-10-CM | POA: Diagnosis not present

## 2022-02-18 DIAGNOSIS — Z0000 Encounter for general adult medical examination without abnormal findings: Secondary | ICD-10-CM | POA: Diagnosis not present

## 2022-02-18 DIAGNOSIS — E7800 Pure hypercholesterolemia, unspecified: Secondary | ICD-10-CM | POA: Diagnosis not present

## 2022-02-18 DIAGNOSIS — E663 Overweight: Secondary | ICD-10-CM | POA: Diagnosis not present

## 2022-02-18 DIAGNOSIS — I10 Essential (primary) hypertension: Secondary | ICD-10-CM | POA: Diagnosis not present

## 2022-02-18 DIAGNOSIS — Z125 Encounter for screening for malignant neoplasm of prostate: Secondary | ICD-10-CM | POA: Diagnosis not present

## 2022-02-19 LAB — CBC WITH DIFFERENTIAL/PLATELET
Absolute Monocytes: 330 cells/uL (ref 200–950)
Basophils Absolute: 61 cells/uL (ref 0–200)
Basophils Relative: 1.8 %
Eosinophils Absolute: 129 cells/uL (ref 15–500)
Eosinophils Relative: 3.8 %
HCT: 43 % (ref 38.5–50.0)
Hemoglobin: 14.8 g/dL (ref 13.2–17.1)
Lymphs Abs: 670 cells/uL — ABNORMAL LOW (ref 850–3900)
MCH: 31.6 pg (ref 27.0–33.0)
MCHC: 34.4 g/dL (ref 32.0–36.0)
MCV: 91.7 fL (ref 80.0–100.0)
MPV: 12.6 fL — ABNORMAL HIGH (ref 7.5–12.5)
Monocytes Relative: 9.7 %
Neutro Abs: 2210 cells/uL (ref 1500–7800)
Neutrophils Relative %: 65 %
Platelets: 103 10*3/uL — ABNORMAL LOW (ref 140–400)
RBC: 4.69 10*6/uL (ref 4.20–5.80)
RDW: 12.3 % (ref 11.0–15.0)
Total Lymphocyte: 19.7 %
WBC: 3.4 10*3/uL — ABNORMAL LOW (ref 3.8–10.8)

## 2022-02-19 LAB — LIPID PANEL
Cholesterol: 106 mg/dL (ref <200)
HDL: 55 mg/dL (ref >=40)
LDL Cholesterol (Calc): 36 mg/dL (calc)
Non-HDL Cholesterol (Calc): 51 mg/dL (calc) (ref <130)
Total CHOL/HDL Ratio: 1.9 (calc) (ref <5.0)
Triglycerides: 66 mg/dL (ref <150)

## 2022-02-19 LAB — HEMOGLOBIN A1C
Hgb A1c MFr Bld: 5.3 % of total Hgb (ref <5.7)
Mean Plasma Glucose: 105 mg/dL
eAG (mmol/L): 5.8 mmol/L

## 2022-02-19 LAB — COMPREHENSIVE METABOLIC PANEL
AG Ratio: 2.2 (calc) (ref 1.0–2.5)
ALT: 23 U/L (ref 9–46)
AST: 36 U/L — ABNORMAL HIGH (ref 10–35)
Albumin: 4.3 g/dL (ref 3.6–5.1)
Alkaline phosphatase (APISO): 86 U/L (ref 35–144)
BUN: 13 mg/dL (ref 7–25)
CO2: 31 mmol/L (ref 20–32)
Calcium: 9.5 mg/dL (ref 8.6–10.3)
Chloride: 102 mmol/L (ref 98–110)
Creat: 0.78 mg/dL (ref 0.70–1.35)
Globulin: 2 g/dL (calc) (ref 1.9–3.7)
Glucose, Bld: 82 mg/dL (ref 65–99)
Potassium: 4.5 mmol/L (ref 3.5–5.3)
Sodium: 137 mmol/L (ref 135–146)
Total Bilirubin: 1.4 mg/dL — ABNORMAL HIGH (ref 0.2–1.2)
Total Protein: 6.3 g/dL (ref 6.1–8.1)

## 2022-02-19 LAB — PSA: PSA: 0.72 ng/mL (ref <=4.00)

## 2022-02-24 ENCOUNTER — Encounter: Payer: Self-pay | Admitting: Family Medicine

## 2022-02-24 ENCOUNTER — Ambulatory Visit: Payer: Medicare PPO | Admitting: Family Medicine

## 2022-02-24 VITALS — BP 130/74 | HR 65 | Ht 69.5 in | Wt 170.2 lb

## 2022-02-24 DIAGNOSIS — G4733 Obstructive sleep apnea (adult) (pediatric): Secondary | ICD-10-CM

## 2022-02-24 DIAGNOSIS — I712 Thoracic aortic aneurysm, without rupture, unspecified: Secondary | ICD-10-CM | POA: Diagnosis not present

## 2022-02-24 DIAGNOSIS — D696 Thrombocytopenia, unspecified: Secondary | ICD-10-CM | POA: Diagnosis not present

## 2022-02-24 DIAGNOSIS — Z0000 Encounter for general adult medical examination without abnormal findings: Secondary | ICD-10-CM

## 2022-02-24 DIAGNOSIS — E7800 Pure hypercholesterolemia, unspecified: Secondary | ICD-10-CM

## 2022-02-24 DIAGNOSIS — I10 Essential (primary) hypertension: Secondary | ICD-10-CM | POA: Diagnosis not present

## 2022-02-24 NOTE — Assessment & Plan Note (Signed)
Well controlled, chronic OSA on CPAP - Good adherence to CPAP nightly - Continue current CPAP therapy, patient seems to be benefiting from therapy  

## 2022-02-24 NOTE — Assessment & Plan Note (Signed)
Well-controlled HTN - Home BP readings controlled  Complication CAD, AS s/p AVR CABG Followed by Cardiology Off BB metoprolol due to bradycardia  Plan:  1. No meds 2. Encourage improved lifestyle - low sodium diet, regular exercise 3. Continue monitor BP outside office, bring readings to next visit, if persistently >140/90 or new symptoms notify office sooner 4. Follow-up 1 year for annual

## 2022-02-24 NOTE — Assessment & Plan Note (Signed)
Controlled on Atorvastatin '80mg'$ , at goal LDL < 70 Discussion that he can review with Cardiology about dose adjust in future Atorvastatin 80 to 40 if still get optimal result. Or consider PCSK9 therapy if indicated in future, given CAD it should be covered as an option, maybe higher cost but would avoid statin issues if he prefers.

## 2022-02-24 NOTE — Assessment & Plan Note (Signed)
Stable slightly low, without complication.

## 2022-02-24 NOTE — Progress Notes (Signed)
Subjective:    Patient ID: Todd Frank, male    DOB: 08/02/1955, 66 y.o.   MRN: [id number]  Todd Frank is a 66 y.o. male presenting on 02/24/2022 for Annual Exam   HPI  Here for Annual Physical and Lab Review  Note he will [place] of [place] to [facility] tomorrow for [facility] Holiday  Elevated Total Bilirubin, / Direct Hyperbilirubinemia Prior labs and US Abdomen have been reviewed Has seen Dr Allen Norris AGI and had further labs with normal GGT and Unconjugated bilirubin determined, to be benign. Stable today on last lab.   Lifestyle / BMI >24 - Overall doing well with lifestyle, some weight loss, some fluctuation - He continues balanced diet, limited without gym right now   Thrombocytopenia, chronic Last lab showed mild low 103 plt, stable from previous range 99-113. Previous history of frequently low PLT - peripheral smear was giant platelet and clumping. No new concerns Denies bleeding or bruising   CHRONIC HTN: Reports no recent concerns, remains well controlled, checks BP regularly at home, 120-130s avg Current Meds - off meds Off BB metoprolol due to bradycardia   CAD, Severe Aortic Stenosis s/p AVR - Followed by [facility] Cardiology (Dr [name]) / Cardiothoracic Surgery Dr Prescott Gum S/p AVR CABG. He has done well. Completed cardiac rehab. - Last ECHO 2022, will continue to follow - On ASA '81mg'$  daily recently seen, he is doing well and exercising, walking regularly daily, he maintains HR with exercise - He had Loop monitor implanted, had rare episode of bradycardia. They are monitoring monthly report.   HYPERLIPIDEMIA: - Followed by Cardiology, has been on high dose lipitor with Atorvastatin '80mg'$  daily, goal LDL < 70 Last lab lipid 02/2022 was controlled LDL 36. HDL normal.   History of Dermatology [facility], Dr Nehemiah Massed Prior malignant melanoma removed. AKs frozen in past     Follow-up Dizziness/Lightheadedness / Seasonal Allergies / Sinusitis -  controlled on Flonase.    OSA on CPAP He is doing well. Controlled on auto CPAP. Using nightly. No concerns.   Left Knee Pain, inner Reports he walks 10k steps per day Inner knee pain, has used rock tape, he has massage therapist Walks 1 hr on treadmill at [facility], and 'Sunday weekend can walk 4.5 miles in neighbor Does not slow him down or interfere He has some reduced range of motion with knee  Health Maintenance:  Vaccines updated Shingles, PNEUMONIA Prevnar-20, Flu  Cologuard screening negative 04/12/20 - repeat 3 years, 04/2023  Prostate CA Screening: Last prostate CA screening PSA 0.72 (02/2022) previous 0.69 (02/2021) previous around 0.6 to 1, prior DRE reported normal. Currently asymptomatic. No known family history of prostate CA      11'$ /20/2023   11:20 AM 02/18/2021    9:22 AM 12/25/2020    1:23 PM  Depression screen PHQ 2/9  Decreased Interest 0 0 0  Down, Depressed, Hopeless 0 0 0  PHQ - 2 Score 0 0 0  Altered sleeping  0   Tired, decreased energy  0   Change in appetite  0   Feeling bad or failure about yourself   0   Trouble concentrating  0   Moving slowly or fidgety/restless  0   Suicidal thoughts  0   PHQ-9 Score  0   Difficult doing work/chores  Not difficult at all     Past Medical History:  Diagnosis Date   Aortic stenosis    a. syncope/severe AS s/p bioprosthetic AVR 06/2017.  Arthritis    Ascending aorta dilatation (HCC)    63m by echo 11/2019   Bicuspid aortic valve    Bradycardia    secondary to BB   Broken clavicle    as child, can still dislocate at times   CAD (coronary artery disease), native coronary artery    a. s/p 2V CABG at time of AVR 06/2017.   Carotid bruit    carotid dopplers negative.  Bruit due to heart murmur from AS   Congenital nevus    Diastolic dysfunction    Dysplastic nevus 12/12/2008   Left mid back, 9cm lat. to spine. Moderate atypia, extends to one edge.    Dysplastic nevus 12/12/2008   Left mid to low back, 7cm lat.  to spine. Moderate atypia, close to margin.    Dysplastic nevus 09/24/2012   Right mid lateral buttocks. Moderate atypia, close to edge.    Dysplastic nevus 08/15/2020   Left mid back infrascapular - excised 09/25/20   Dysplastic nevus 10/02/2020   left post deltoid near tricep, mild atypia   Dysplastic nevus 10/02/2020   right ant lateral prox thigh, severe, Shave removal 11/19/20   Dysplastic nevus 04/11/2021   right mid back lateral near side, moderate atypia   Dysplastic nevus 04/11/2021   left upper back paraspinal, moderate atypia   GERD (gastroesophageal reflux disease)    Headache    optical migraine   Hx of melanoma in situ 06/26/2009   Left superior thigh. Arising in dysplastic nevus, close to margin. Excised 07/11/2009, margins free.   Hyperlipidemia    LDL goal < 70   LVH (left ventricular hypertrophy)    Melanoma of skin (HCC)    Obesity    PFO (patent foramen ovale)    small by echo 2014, but not demonstrated during intra-op note from CABG/AVR 06/2017.   Past Surgical History:  Procedure Laterality Date   AORTIC VALVE REPLACEMENT N/A 06/25/2017   Procedure: AORTIC VALVE REPLACEMENT (AVR) using Magna Ease Aortic Valve size 258m  Surgeon: VaIvin PootMD;  [place]: [facility];  Laterality: N/A;   arthritic cyst removal  2017   from Lumbar 4-5, dr pool   CARDIAC CATHETERIZATION N/A 02/20/2016   Procedure: Right/Left Heart Cath and Coronary Angiography;  Surgeon: HeBelva CromeMD;  [facility];  Service: Cardiovascular;  Laterality: N/A;   CORONARY ARTERY BYPASS GRAFT N/A 06/25/2017   Procedure: CORONARY ARTERY BYPASS GRAFTING (CABG) x Two , using left internal mammary artery and right leg greater saphenous vein harvested endoscopically;  Surgeon: VaIvin PootMD;  [place] [facility]: [facility];  Laterality: N/A;   CORONARY/GRAFT ANGIOGRAPHY N/A 06/02/2017   Procedure: CORONARY/GRAFT ANGIOGRAPHY;  Surgeon:  EnNelva BushMD;  [place] [facility];  Service: Cardiovascular;  Laterality: N/A;   EYE SURGERY     MELANOMA EXCISION  2011   left thigh   RIGHT HEART CATH N/A 06/02/2017   Procedure: RIGHT HEART CATH;  Surgeon: EnNelva BushMD;  [place] [facility] [place];  Service: Cardiovascular;  Laterality: N/A;   TEE WITHOUT CARDIOVERSION N/A 06/25/2017   Procedure: TRANSESOPHAGEAL ECHOCARDIOGRAM (TEE);  Surgeon: VaPrescott GumPeCollier SalinaMD;  [place] [facility]: [place] [facility];  Laterality: N/A;   TONSILLECTOMY     Social History   Socioeconomic History   Marital status: Married    Spouse name: DaHinton Dyer Number of children: 0   Years of  education: Assoc degree   Highest education level: Not on file  Occupational History   Occupation: Government social research officer - [employer]  Tobacco Use   Smoking status: Former    Packs/day: 1.00    Types: Cigarettes    Quit date: 04/07/1976    Years since quitting: 45.9   Smokeless tobacco: Former   Tobacco comments:    quit in 78, teenager  Vaping Use   Vaping Use: Never used  Substance and Sexual Activity   Alcohol use: Yes    Alcohol/week: 1.0 standard drink of alcohol    Types: 1 Cans of beer per week    Comment: occasional   Drug use: No    Comment: quit marijuana in 1990   Sexual activity: Yes  Other Topics Concern   Not on file  Social History Narrative   Lives with wife   Caffeine < 1 cup daily   Social Determinants of Health   Financial Resource Strain: Low Risk  (12/25/2020)   Overall Financial Resource Strain (CARDIA)    Difficulty of Paying Living Expenses: Not hard at all  Food Insecurity: No Food Insecurity (12/25/2020)   Hunger Vital Sign    Worried About Running Out of Food in the Last Year: Never true    Ran Out of Food in the Last Year: Never true  Transportation Needs: No Transportation Needs (12/25/2020)   PRAPARE - Hydrologist (Medical): No    Lack of Transportation (Non-Medical):  No  Physical Activity: Sufficiently Active (12/25/2020)   Exercise Vital Sign    Days of Exercise per Week: 7 days    Minutes of Exercise per Session: 60 min  Stress: No Stress Concern Present (12/25/2020)   [place]    Feeling of Stress : Not at all  Social Connections: Somewhat Isolated (05/31/2017)   Social Connection and Isolation Panel [NHANES]    Frequency of Communication with Friends and Family: Three times a week    Frequency of Social Gatherings with Friends and Family: More than three times a week    Attends Religious Services: Never    Marine scientist or Organizations: No    Attends Archivist Meetings: Never    Marital Status: Married  Human resources officer Violence: Not At Risk (05/31/2017)   Humiliation, Afraid, Rape, and Kick questionnaire    Fear of Current or Ex-Partner: No    Emotionally Abused: No    Physically Abused: No    Sexually Abused: No   Family History  Problem Relation Age of Onset   Diabetes Mother    Heart disease Mother    Arthritis Father    Arthritis Sister    Heart disease Paternal Grandmother        MI   Heart attack Other        family history   Pancreatic cancer Maternal Aunt    Current Outpatient Medications on File Prior to Visit  Medication Sig   aspirin EC 81 MG tablet Take 81 mg by mouth daily.   atorvastatin (LIPITOR) 80 MG tablet Take 1 tablet (80 mg total) by mouth at bedtime.   cholecalciferol (VITAMIN D) 1000 units tablet Take 1,000 Units by mouth every evening.   Coenzyme Q10 (COQ-10) 200 MG CAPS Take 200 mg by mouth daily.   No current facility-administered medications on file prior to visit.    Review of Systems  Constitutional:  Negative for activity change, appetite change,  chills, diaphoresis, fatigue and fever.  HENT:  Negative for congestion and hearing loss.   Eyes:  Negative for visual disturbance.  Respiratory:  Negative for cough,  chest tightness, shortness of breath and wheezing.   Cardiovascular:  Negative for chest pain, palpitations and leg swelling.  Gastrointestinal:  Negative for abdominal pain, constipation, diarrhea, nausea and vomiting.  Genitourinary:  Negative for dysuria, frequency and hematuria.  Musculoskeletal:  Negative for arthralgias and neck pain.  Skin:  Negative for rash.  Neurological:  Negative for dizziness, weakness, light-headedness, numbness and headaches.  Hematological:  Negative for adenopathy.  Psychiatric/Behavioral:  Negative for behavioral problems, dysphoric mood and sleep disturbance.    Per HPI unless specifically indicated above      Objective:    BP 130/74 (BP Location: Left Arm, Cuff Size: Normal)   Pulse 65   Ht 5' 9.5" (1.765 m)   Wt 170 lb 3.2 oz (77.2 kg)   SpO2 98%   BMI 24.77 kg/m   Wt Readings from Last 3 Encounters:  02/24/22 170 lb 3.2 oz (77.2 kg)  09/16/21 167 lb (75.8 kg)  02/18/21 171 lb 6.4 oz (77.7 kg)    Physical Exam Vitals and nursing note reviewed.  Constitutional:      General: He is not in acute distress.    Appearance: He is well-developed. He is not diaphoretic.     Comments: Well-appearing, comfortable, cooperative  HENT:     Head: Normocephalic and atraumatic.  Eyes:     General:        Right eye: No discharge.        Left eye: No discharge.     Conjunctiva/sclera: Conjunctivae normal.     Pupils: Pupils are equal, round, and reactive to light.  Neck:     Thyroid: No thyromegaly.     Vascular: No carotid bruit.  Cardiovascular:     Rate and Rhythm: Normal rate and regular rhythm.     Pulses: Normal pulses.     Heart sounds: Normal heart sounds. No murmur heard. Pulmonary:     Effort: Pulmonary effort is normal. No respiratory distress.     Breath sounds: Normal breath sounds. No wheezing or rales.  Abdominal:     General: Bowel sounds are normal. There is no distension.     Palpations: Abdomen is soft. There is no mass.      Tenderness: There is no abdominal tenderness.  Musculoskeletal:        General: No tenderness. Normal range of motion.     Cervical back: Normal range of motion and neck supple.     Right lower leg: No edema.     Left lower leg: No edema.     Comments: Upper / Lower Extremities: - Normal muscle tone, strength bilateral upper extremities 5/5, lower extremities 5/5  Lymphadenopathy:     Cervical: No cervical adenopathy.  Skin:    General: Skin is warm and dry.     Findings: No erythema or rash.  Neurological:     Mental Status: He is alert and oriented to person, place, and time.     Comments: Distal sensation intact to light touch all extremities  Psychiatric:        Mood and Affect: Mood normal.        Behavior: Behavior normal.        Thought Content: Thought content normal.     Comments: Well groomed, good eye contact, normal speech and thoughts  Results for orders placed or performed in visit on 02/17/22  CBC with Differential  Result Value Ref Range   WBC 3.4 (L) 3.8 - 10.8 Thousand/uL   RBC 4.69 4.20 - 5.80 Million/uL   Hemoglobin 14.8 13.2 - 17.1 g/dL   HCT 43.0 38.5 - 50.0 %   MCV 91.7 80.0 - 100.0 fL   MCH 31.6 27.0 - 33.0 pg   MCHC 34.4 32.0 - 36.0 g/dL   RDW 12.3 11.0 - 15.0 %   Platelets 103 (L) 140 - 400 Thousand/uL   MPV 12.6 (H) 7.5 - 12.5 fL   Neutro Abs 2,210 1,500 - 7,800 cells/uL   Lymphs Abs 670 (L) 850 - 3,900 cells/uL   Absolute Monocytes 330 200 - 950 cells/uL   Eosinophils Absolute 129 15 - 500 cells/uL   Basophils Absolute 61 0 - 200 cells/uL   Neutrophils Relative % 65 %   Total Lymphocyte 19.7 %   Monocytes Relative 9.7 %   Eosinophils Relative 3.8 %   Basophils Relative 1.8 %  PSA  Result Value Ref Range   PSA 0.72 < OR = 4.00 ng/mL  Lipid panel  Result Value Ref Range   Cholesterol 106 <200 mg/dL   HDL 55 > OR = 40 mg/dL   Triglycerides 66 <150 mg/dL   LDL Cholesterol (Calc) 36 mg/dL (calc)   Total CHOL/HDL Ratio 1.9 <5.0  (calc)   Non-HDL Cholesterol (Calc) 51 <130 mg/dL (calc)  Comprehensive Metabolic Panel (CMET)  Result Value Ref Range   Glucose, Bld 82 65 - 99 mg/dL   BUN 13 7 - 25 mg/dL   Creat 0.78 0.70 - 1.35 mg/dL   BUN/Creatinine Ratio SEE NOTE: 6 - 22 (calc)   Sodium 137 135 - 146 mmol/L   Potassium 4.5 3.5 - 5.3 mmol/L   Chloride 102 98 - 110 mmol/L   CO2 31 20 - 32 mmol/L   Calcium 9.5 8.6 - 10.3 mg/dL   Total Protein 6.3 6.1 - 8.1 g/dL   Albumin 4.3 3.6 - 5.1 g/dL   Globulin 2.0 1.9 - 3.7 g/dL (calc)   AG Ratio 2.2 1.0 - 2.5 (calc)   Total Bilirubin 1.4 (H) 0.2 - 1.2 mg/dL   Alkaline phosphatase (APISO) 86 35 - 144 U/L   AST 36 (H) 10 - 35 U/L   ALT 23 9 - 46 U/L  HgB A1c  Result Value Ref Range   Hgb A1c MFr Bld 5.3 <5.7 % of total Hgb   Mean Plasma Glucose 105 mg/dL   eAG (mmol/L) 5.8 mmol/L      Assessment & Plan:   Problem List Items Addressed This Visit     Aneurysm of thoracic aorta (HCC)   Essential hypertension    Well-controlled HTN - Home BP readings controlled  Complication CAD, AS s/p AVR CABG Followed by Cardiology Off BB metoprolol due to bradycardia  Plan:  1. No meds 2. Encourage improved lifestyle - low sodium diet, regular exercise 3. Continue monitor BP outside office, bring readings to next visit, if persistently >140/90 or new symptoms notify office sooner 4. Follow-up 1 year for annual      Hypercholesteremia    Controlled on Atorvastatin '80mg'$ , at goal LDL < 70 Discussion that he can review with Cardiology about dose adjust in future Atorvastatin 80 to 40 if still get optimal result. Or consider PCSK9 therapy if indicated in future, given CAD it should be covered as an option, maybe higher cost but would avoid statin  issues if he prefers.      OSA on CPAP    Well controlled, chronic OSA on CPAP - Good adherence to CPAP nightly - Continue current CPAP therapy, patient seems to be benefiting from therapy       Thrombocytopenia ([place])    Stable  slightly low, without complication.      Other Visit Diagnoses     Annual physical exam    -  Primary       Updated Health Maintenance information Reviewed recent lab results with patient Encouraged improvement to lifestyle with diet and exercise  Goal of weight loss   No orders of the defined types were placed in this encounter.     Follow up plan: Return in about 1 year (around 02/25/2023) for 1 year fastingn lab only then 1 week later Annual Physical.  Nobie Putnam, DO [facility] 02/24/2022, 11:04 AM

## 2022-02-24 NOTE — Patient Instructions (Addendum)
Thank you for coming to the office today.  O+ (ABO Blood Type)  LDL 36, at goal < 70, excellent well controlled As discussed, you may be able to still have controlled LDL at a modified dose, such as Atorvastatin '40mg'$  Or in future can consider the PCSK9 Inhibitor injections such as Repatha, Praluent. Insurance has major improvement coverage on these overall even for CAD indication.   Keep an eye on BP more regularly. If >140 consistently let me know.  For Knee Recommend using the tape, and sleeve Try to do low impact exercise if possible, elliptical and water aerobics  START anti inflammatory topical - OTC Voltaren (generic Diclofenac) topical 2-4 times a day as needed for pain swelling of affected joint for 1-2 weeks or longer.   DUE for FASTING BLOOD WORK (no food or drink after midnight before the lab appointment, only water or coffee without cream/sugar on the morning of)  SCHEDULE "Lab Only" visit in the morning at the clinic for lab draw in 1 YEAR  - Make sure Lab Only appointment is at about 1 week before your next appointment, so that results will be available  For Lab Results, once available within 2-3 days of blood draw, you can can log in to MyChart online to view your results and a brief explanation. Also, we can discuss results at next follow-up visit.   Please schedule a Follow-up Appointment to: Return in about 1 year (around 02/25/2023) for 1 year fastingn lab only then 1 week later Annual Physical.  If you have any other questions or concerns, please feel free to call the office or send a message through [facility]. You may also schedule an earlier appointment if necessary.  Additionally, you may be receiving a survey about your experience at our office within a few days to 1 week by e-mail or mail. We value your feedback.  Nobie Putnam, DO [facility]

## 2022-02-26 NOTE — Progress Notes (Signed)
Carelink Summary Report / Loop Recorder 

## 2022-03-03 ENCOUNTER — Ambulatory Visit (INDEPENDENT_AMBULATORY_CARE_PROVIDER_SITE_OTHER): Payer: Medicare PPO

## 2022-03-03 DIAGNOSIS — R55 Syncope and collapse: Secondary | ICD-10-CM

## 2022-03-04 LAB — CUP PACEART REMOTE DEVICE CHECK
Date Time Interrogation Session: 20231126232011
Implantable Pulse Generator Implant Date: 20220831

## 2022-03-10 ENCOUNTER — Telehealth: Payer: Self-pay

## 2022-03-10 NOTE — Telephone Encounter (Signed)
Attempted to contact the patient to schedule his AWV. The patient declined scheduling the appt at this time.

## 2022-03-11 ENCOUNTER — Encounter: Payer: Self-pay | Admitting: Family Medicine

## 2022-03-17 ENCOUNTER — Ambulatory Visit
Admission: RE | Admit: 2022-03-17 | Discharge: 2022-03-17 | Disposition: A | Discharge status: Home / Self Care | Payer: Medicare PPO | Source: Ambulatory Visit | Attending: Family Medicine | Admitting: Family Medicine

## 2022-03-17 ENCOUNTER — Ambulatory Visit: Payer: Medicare PPO | Admitting: Family Medicine

## 2022-03-17 ENCOUNTER — Encounter: Payer: Self-pay | Admitting: Family Medicine

## 2022-03-17 ENCOUNTER — Ambulatory Visit
Admission: RE | Admit: 2022-03-17 | Discharge: 2022-03-17 | Disposition: A | Discharge status: Home / Self Care | Payer: Medicare PPO | Attending: Family Medicine | Admitting: Family Medicine

## 2022-03-17 VITALS — BP 122/84 | HR 65 | Ht 69.5 in | Wt 172.0 lb

## 2022-03-17 DIAGNOSIS — M25562 Pain in left knee: Secondary | ICD-10-CM

## 2022-03-17 DIAGNOSIS — M1712 Unilateral primary osteoarthritis, left knee: Secondary | ICD-10-CM | POA: Diagnosis not present

## 2022-03-17 MED ORDER — MELOXICAM 15 MG PO TABS: 15.0000 mg | ORAL_TABLET | Freq: Every day | ORAL | 0 refills | Status: DC

## 2022-03-17 NOTE — Assessment & Plan Note (Signed)
Patient with atraumatic left anteromedial knee pain ongoing for roughly 5-6 months, somewhat progressive, in the setting of high baseline level of cardiovascular activity (treadmill, outdoor cardio).  He has dosed naproxen and Voltaren gel a few times per week with limited benefit.  He denies any swelling, mechanical symptoms, buckling/near buckling.  Of note, pes planus reported.  Examination without abnormality to inspection, trace-1+ effusion, nontender patellar facets, VMO, quadriceps or patellar tendon, focal tenderness at the medial joint line, deep flexion to 130 degrees recreates minor anterior pain, negative anterior/posterior drawer, varus/valgus stressing benign, negative McMurray's testing.  History and findings are most consistent with osteoarthritis related arthralgia, will confirm and assess degree of involvement with dedicated left knee x-rays, did discuss overarching treatment strategy and will initiate scheduled meloxicam, relative rest with a transition to close chain activities, and maintain close follow-up in 3 weeks with low threshold to advance to ultrasound-guided corticosteroid injection if symptoms fail to abate.  If improved/improving, home-based versus formal PT to commence.

## 2022-03-17 NOTE — Progress Notes (Signed)
     Primary Care / Sports Medicine Office Visit  Patient Information:  Patient ID: Todd Frank, male DOB: [birth year] Age: 66 y.o. MRN: [id number]   Todd Frank is a pleasant 66 y.o. male presenting with the following:  Chief Complaint  Patient presents with   Knee Pain    L) knee pain x 5-6 months- sore and "popping and grinding"    Vitals:   03/17/22 1412  BP: 122/84  Pulse: 65  SpO2: 98%   Vitals:   03/17/22 1412  Weight: 172 lb (78 kg)  Height: 5' 9.5" (1.765 m)   Body mass index is 25.04 kg/m.  CUP PACEART REMOTE DEVICE CHECK  Result Date: 03/04/2022 ILR summary report received. Battery status OK. Normal device function. No new symptom, tachy, brady, or pause episodes. No new AF episodes. Monthly summary reports and ROV/PRN LA    Independent interpretation of notes and tests performed by another provider:   None  Procedures performed:   None  Pertinent History, Exam, Impression, and Recommendations:   Problem List Items Addressed This Visit       Other   Arthralgia of left knee - Primary    Patient with atraumatic left anteromedial knee pain ongoing for roughly 5-6 months, somewhat progressive, in the setting of high baseline level of cardiovascular activity (treadmill, outdoor cardio).  He has dosed naproxen and Voltaren gel a few times per week with limited benefit.  He denies any swelling, mechanical symptoms, buckling/near buckling.  Of note, pes planus reported.  Examination without abnormality to inspection, trace-1+ effusion, nontender patellar facets, VMO, quadriceps or patellar tendon, focal tenderness at the medial joint line, deep flexion to 130 degrees recreates minor anterior pain, negative anterior/posterior drawer, varus/valgus stressing benign, negative McMurray's testing.  History and findings are most consistent with osteoarthritis related arthralgia, will confirm and assess degree of involvement with dedicated left knee x-rays, did  discuss overarching treatment strategy and will initiate scheduled meloxicam, relative rest with a transition to close chain activities, and maintain close follow-up in 3 weeks with low threshold to advance to ultrasound-guided corticosteroid injection if symptoms fail to abate.  If improved/improving, home-based versus formal PT to commence.      Relevant Medications   meloxicam (MOBIC) 15 MG tablet   Other Relevant Orders   DG Knee Complete 4 Views Left     Orders & Medications Meds ordered this encounter  Medications   meloxicam (MOBIC) 15 MG tablet    Sig: Take 1 tablet (15 mg total) by mouth daily.    Dispense:  30 tablet    Refill:  0   Orders Placed This Encounter  Procedures   DG Knee Complete 4 Views Left     Return in about 3 weeks (around 04/07/2022).     Montel Culver, MD, [facility]   Primary Care Sports Medicine Primary Care and Sports Medicine at [facility]

## 2022-03-17 NOTE — Patient Instructions (Addendum)
-   Start meloxicam, dose once daily with food (no other NSAIDs while this medication) - Can utilize ice at 20-minute intervals and/or Tylenol (acetaminophen) for additional pain control if needed - Hold from treadmill, can perform light elliptical instead, use knee symptoms as a guide both during and after activity (do not push through pain) - Can review exercises from the following website below, hold from starting until follow-up - Return for follow-up in 3 weeks, contact our office for any questions/concerns between now and then  [email or web address]

## 2022-04-08 ENCOUNTER — Ambulatory Visit (INDEPENDENT_AMBULATORY_CARE_PROVIDER_SITE_OTHER): Payer: Medicare PPO

## 2022-04-08 DIAGNOSIS — R55 Syncope and collapse: Secondary | ICD-10-CM

## 2022-04-08 LAB — CUP PACEART REMOTE DEVICE CHECK
Date Time Interrogation Session: 20240101231701
Implantable Pulse Generator Implant Date: 20220831

## 2022-04-10 ENCOUNTER — Encounter: Payer: Self-pay | Admitting: Family Medicine

## 2022-04-10 ENCOUNTER — Ambulatory Visit: Payer: Medicare PPO | Admitting: Family Medicine

## 2022-04-10 VITALS — BP 138/88 | HR 64 | Ht 69.5 in | Wt 170.0 lb

## 2022-04-10 VITALS — BP 138/88 | HR 64 | Ht 69.5 in | Wt 171.4 lb

## 2022-04-10 DIAGNOSIS — I10 Essential (primary) hypertension: Secondary | ICD-10-CM

## 2022-04-10 DIAGNOSIS — M1712 Unilateral primary osteoarthritis, left knee: Secondary | ICD-10-CM

## 2022-04-10 MED ORDER — AMLODIPINE BESYLATE 2.5 MG PO TABS: 2.5000 mg | ORAL_TABLET | Freq: Every day | ORAL | 1 refills | Status: DC

## 2022-04-10 NOTE — Assessment & Plan Note (Signed)
Patient returns for atraumatic left anteromedial knee pain.  At last visit concern for primary focality to patellofemoral and medial cervical compartments with concern for osteoarthritis.  X-rays were ordered.  Over the interim patient was able to dose meloxicam x 2 weeks, transition from treadmill to adaptive body machine and has avoided walking outside for athletics.  He has noted essential full symptom resolution with this regimen.  Examination today reveals no effusion, minimally tender along the medial joint line, nontender along the patellar facets and mild discomfort with maximal flexion.  Given his tray findings, his examination is consistent with the same and he has demonstrated excellent interval progress.  At this stage of advised patient to utilize home exercises provided at last visit with heavy focus on VMO and hip abductor strengthening, patellofemoral stabilization, and can transition to as needed dosing of meloxicam.  He can follow-up as needed as well.

## 2022-04-10 NOTE — Progress Notes (Signed)
Subjective:    Patient ID: Todd Frank, male    DOB: Aug 08, 1955, 67 y.o.   MRN: [id number]  Todd Frank is a 67 y.o. male presenting on 04/10/2022 for Hypertension   HPI  HYPERTENSION Elevated BP trend home readings Home BP readings Nov to Dec 130/80s, then increase to high 130s to 140s, now in Dec/Jan BP 140-150s / 80s-90s. HR 40-60  He took HCTZ 12.'5mg'$  dose from wife yesterday He lost 5 lbs in possible fluid weight but doesn't feel swollen He was on thiazide in the past but was off of it, no particular reason or complication Followed by Dr Radford Pax Cardiology  Had been on Amlodipine 2.5 in the past as well but had been off of that no longer needed w/ history of syncope Now BP elevated   History of Syncope Was on Lisinopril in the past but now off  CHRONIC HTN: Reports no recent concerns, remains well controlled, checks BP regularly at home, 120-130s avg Current Meds - off meds Off BB metoprolol due to bradycardia   CAD, Severe Aortic Stenosis s/p AVR - Followed by [facility] (Dr [name]) / Cardiothoracic Surgery Dr Prescott Gum S/p AVR CABG. He has done well. Completed cardiac rehab. - Last ECHO 2022, will continue to follow - On ASA '81mg'$  daily recently seen, he is doing well and exercising, walking regularly daily, he maintains HR with exercise - He had Loop monitor implanted, had rare episode of bradycardia. They are monitoring monthly report.       03/17/2022    2:13 PM 02/24/2022   11:20 AM 02/18/2021    9:22 AM  Depression screen PHQ 2/9  Decreased Interest 0 0 0  Down, Depressed, Hopeless 0 0 0  PHQ - 2 Score 0 0 0  Altered sleeping 0  0  Tired, decreased energy 0  0  Change in appetite 0  0  Feeling bad or failure about yourself  0  0  Trouble concentrating 0  0  Moving slowly or fidgety/restless 0  0  Suicidal thoughts 0  0  PHQ-9 Score 0  0  Difficult doing work/chores Not difficult at all  Not difficult at all    Social History    Tobacco Use   Smoking status: Former    Packs/day: 1.00    Types: Cigarettes    Quit date: 04/07/1976    Years since quitting: 46.0   Smokeless tobacco: Former   Tobacco comments:    quit in 78, teenager  Vaping Use   Vaping Use: Never used  Substance Use Topics   Alcohol use: Yes    Alcohol/week: 1.0 standard drink of alcohol    Types: 1 Cans of beer per week    Comment: occasional   Drug use: No    Comment: quit marijuana in 1990    Review of Systems Per HPI unless specifically indicated above     Objective:    BP 138/88 (BP Location: Left Arm, Cuff Size: Normal)   Pulse 64   Ht 5' 9.5" (1.765 m)   Wt 171 lb 6.4 oz (77.7 kg)   SpO2 100%   BMI 24.95 kg/m   Wt Readings from Last 3 Encounters:  04/10/22 171 lb 6.4 oz (77.7 kg)  03/17/22 172 lb (78 kg)  02/24/22 170 lb 3.2 oz (77.2 kg)    Physical Exam Vitals and nursing note reviewed.  Constitutional:      General: He is not in acute distress.  Appearance: Normal appearance. He is well-developed. He is not diaphoretic.     Comments: Well-appearing, comfortable, cooperative  HENT:     Head: Normocephalic and atraumatic.  Eyes:     General:        Right eye: No discharge.        Left eye: No discharge.     Conjunctiva/sclera: Conjunctivae normal.  Cardiovascular:     Rate and Rhythm: Normal rate.  Pulmonary:     Effort: Pulmonary effort is normal.  Musculoskeletal:     Right lower leg: No edema.     Left lower leg: No edema.  Skin:    General: Skin is warm and dry.     Findings: No erythema or rash.  Neurological:     Mental Status: He is alert and oriented to person, place, and time.  Psychiatric:        Mood and Affect: Mood normal.        Behavior: Behavior normal.        Thought Content: Thought content normal.     Comments: Well groomed, good eye contact, normal speech and thoughts      Results for orders placed or performed in visit on 04/08/22  CUP PACEART REMOTE DEVICE CHECK  Result  Value Ref Range   Date Time Interrogation Session [phone]    Pulse Generator Manufacturer MERM    Pulse Gen Model LNQ22 LINQ II    Pulse Gen Serial Number [id number] [facility]    Implantable Pulse Generator Type ICM/ILR    Implantable Pulse Generator Implant Date [id number]       Assessment & Plan:   Problem List Items Addressed This Visit     Essential hypertension - Primary   Relevant Medications   amLODipine (NORVASC) 2.5 MG tablet    Meds ordered this encounter  Medications   amLODipine (NORVASC) 2.5 MG tablet    Sig: Take 1 tablet (2.5 mg total) by mouth daily.    Dispense:  90 tablet    Refill:  1   HYPERTENSION Recent elevation over past few weeks to months. Goal to restart therapy Previously on Amlodipine 2.'5mg'$  daily few years ago per Cardiology and he did well until eventually no longer needed it  Start low dose Amlodipine 2.'5mg'$  daily  Let me know on mychart message or call within 1-2 weeks if BP is still not at goal < 140 / 90 then eventually 120s / 70s  We can double dose to '5mg'$  daily and re order in the future, if inadequate results, he will notify me.  Alternative options consider low dose HCTZ Thiazide 12.'5mg'$  daily given he had notable fluid wt loss on trial dose recently but he does not have edema, so it seems he is not actually having significant edema that warrants treatment. Alternatively could consider ARB as option if needed.  CC Route note to Dr Radford Pax for review and future follow-up regarding BP  Follow up plan: Return if symptoms worsen or fail to improve.    Nobie Putnam, DO [facility] 04/10/2022, 10:28 AM

## 2022-04-10 NOTE — Progress Notes (Signed)
     Primary Care / Sports Medicine Office Visit  Patient Information:  Patient ID: Todd Frank, male DOB: 1955/04/15 Age: 67 y.o. MRN: [id number]   Todd Frank is a pleasant 67 y.o. male presenting with the following:  Chief Complaint  Patient presents with   Arthralgia of left knee    Vitals:   04/10/22 1316  BP: 138/88  Pulse: 64  SpO2: 99%   Vitals:   04/10/22 1316  Weight: 170 lb (77.1 kg)  Height: 5' 9.5" (1.765 m)   Body mass index is 24.74 kg/m.   Independent interpretation of notes and tests performed by another provider:   Independent interpretation of left knee x-rays dated 03/17/2022 reveals degenerative changes primarily involving the medial tibiofemoral and patellofemoral compartments, subtle shadow consistent with effusion  Procedures performed:   None  Pertinent History, Exam, Impression, and Recommendations:   Todd Frank was seen today for arthralgia of left knee.  Primary osteoarthritis of left knee Assessment & Plan: Patient returns for atraumatic left anteromedial knee pain.  At last visit concern for primary focality to patellofemoral and medial cervical compartments with concern for osteoarthritis.  X-rays were ordered.  Over the interim patient was able to dose meloxicam x 2 weeks, transition from treadmill to adaptive body machine and has avoided walking outside for athletics.  He has noted essential full symptom resolution with this regimen.  Examination today reveals no effusion, minimally tender along the medial joint line, nontender along the patellar facets and mild discomfort with maximal flexion.  Given his tray findings, his examination is consistent with the same and he has demonstrated excellent interval progress.  At this stage of advised patient to utilize home exercises provided at last visit with heavy focus on VMO and hip abductor strengthening, patellofemoral stabilization, and can transition to as needed dosing of meloxicam.  He  can follow-up as needed as well.    Orders & Medications No orders of the defined types were placed in this encounter.  No orders of the defined types were placed in this encounter.    No follow-ups on file.     Montel Culver, MD, [facility]   Primary Care Sports Medicine Primary Care and Sports Medicine at [facility]

## 2022-04-10 NOTE — Patient Instructions (Addendum)
Thank you for coming to the office today.  Start low dose Amlodipine 2.'5mg'$  daily  Let me know on mychart message or call within 1-2 weeks if BP is still not at goal < 140 / 90 then eventually 120s / 70s  We can double dose to '5mg'$  daily and re order in the future.  I will forward chart to Dr Radford Pax for review as well.  Please schedule a Follow-up Appointment to: Return if symptoms worsen or fail to improve.  If you have any other questions or concerns, please feel free to call the office or send a message through [place]. You may also schedule an earlier appointment if necessary.  Additionally, you may be receiving a survey about your experience at our office within a few days to 1 week by e-mail or mail. We value your feedback.  Nobie Putnam, DO [place]

## 2022-04-14 ENCOUNTER — Ambulatory Visit: Payer: Medicare PPO | Admitting: Dermatology

## 2022-04-14 DIAGNOSIS — Z1283 Encounter for screening for malignant neoplasm of skin: Secondary | ICD-10-CM

## 2022-04-14 DIAGNOSIS — D229 Melanocytic nevi, unspecified: Secondary | ICD-10-CM

## 2022-04-14 DIAGNOSIS — D225 Melanocytic nevi of trunk: Secondary | ICD-10-CM

## 2022-04-14 DIAGNOSIS — L578 Other skin changes due to chronic exposure to nonionizing radiation: Secondary | ICD-10-CM | POA: Diagnosis not present

## 2022-04-14 DIAGNOSIS — Z86018 Personal history of other benign neoplasm: Secondary | ICD-10-CM

## 2022-04-14 DIAGNOSIS — Z86006 Personal history of melanoma in-situ: Secondary | ICD-10-CM | POA: Diagnosis not present

## 2022-04-14 DIAGNOSIS — L820 Inflamed seborrheic keratosis: Secondary | ICD-10-CM | POA: Diagnosis not present

## 2022-04-14 DIAGNOSIS — L814 Other melanin hyperpigmentation: Secondary | ICD-10-CM | POA: Diagnosis not present

## 2022-04-14 DIAGNOSIS — L821 Other seborrheic keratosis: Secondary | ICD-10-CM

## 2022-04-14 DIAGNOSIS — D485 Neoplasm of uncertain behavior of skin: Secondary | ICD-10-CM

## 2022-04-14 DIAGNOSIS — Z85820 Personal history of malignant melanoma of skin: Secondary | ICD-10-CM

## 2022-04-14 NOTE — Patient Instructions (Addendum)
Wound Care Instructions  Cleanse wound gently with soap and water once a day then pat dry with clean gauze. Apply a thin coat of Petrolatum (petroleum jelly, "Vaseline") over the wound (unless you have an allergy to this). We recommend that you use a new, sterile tube of Vaseline. Do not pick or remove scabs. Do not remove the yellow or white "healing tissue" from the base of the wound.  Cover the wound with fresh, clean, nonstick gauze and secure with paper tape. You may use Band-Aids in place of gauze and tape if the wound is small enough, but would recommend trimming much of the tape off as there is often too much. Sometimes Band-Aids can irritate the skin.  You should call the office for your biopsy report after 1 week if you have not already been contacted.  If you experience any problems, such as abnormal amounts of bleeding, swelling, significant bruising, significant pain, or evidence of infection, please call the office immediately.  FOR ADULT SURGERY PATIENTS: If you need something for pain relief you may take 1 extra strength Tylenol (acetaminophen) AND 2 Ibuprofen (200mg each) together every 4 hours as needed for pain. (do not take these if you are allergic to them or if you have a reason you should not take them.) Typically, you may only need pain medication for 1 to 3 days.     Due to recent changes in healthcare laws, you may see results of your pathology and/or laboratory studies on MyChart before the doctors have had a chance to review them. We understand that in some cases there may be results that are confusing or concerning to you. Please understand that not all results are received at the same time and often the doctors may need to interpret multiple results in order to provide you with the best plan of care or course of treatment. Therefore, we ask that you please give us 2 business days to thoroughly review all your results before contacting the office for clarification. Should  we see a critical lab result, you will be contacted sooner.   If You Need Anything After Your Visit  If you have any questions or concerns for your doctor, please call our main line at [phone] and press option 4 to reach your doctor's medical assistant. If no one answers, please leave a voicemail as directed and we will return your call as soon as possible. Messages left after 4 pm will be answered the following business day.   You may also send us a message via MyChart. We typically respond to MyChart messages within 1-2 business days.  For prescription refills, please ask your pharmacy to contact our office. Our fax number is [phone].  If you have an urgent issue when the clinic is closed that cannot wait until the next business day, you can page your doctor at the number below.    Please note that while we do our best to be available for urgent issues outside of office hours, we are not available 24/7.   If you have an urgent issue and are unable to reach us, you may choose to seek medical care at your doctor's office, retail clinic, urgent care center, or emergency room.  If you have a medical emergency, please immediately call 911 or go to the emergency department.  Pager Numbers  - Dr. Kowalski: [phone]  - Dr. Moye: [phone]  - Dr. Stewart: [phone]  In the event of inclement weather, please call our main line at   [phone] for an update on the status of any delays or closures.  Dermatology Medication Tips: Please keep the boxes that topical medications come in in order to help keep track of the instructions about where and how to use these. Pharmacies typically print the medication instructions only on the boxes and not directly on the medication tubes.   If your medication is too expensive, please contact our office at [phone] option 4 or send us a message through MyChart.   We are unable to tell what your co-pay for medications will be in  advance as this is different depending on your insurance coverage. However, we may be able to find a substitute medication at lower cost or fill out paperwork to get insurance to cover a needed medication.   If a prior authorization is required to get your medication covered by your insurance company, please allow us 1-2 business days to complete this process.  Drug prices often vary depending on where the prescription is filled and some pharmacies may offer cheaper prices.  The website www.goodrx.com contains coupons for medications through different pharmacies. The prices here do not account for what the cost may be with help from insurance (it may be cheaper with your insurance), but the website can give you the price if you did not use any insurance.  - You can print the associated coupon and take it with your prescription to the pharmacy.  - You may also stop by our office during regular business hours and pick up a GoodRx coupon card.  - If you need your prescription sent electronically to a different pharmacy, notify our office through [facility] MyChart or by phone at [phone] option 4.     Si Usted Necesita Algo Despus de Su Visita  Tambin puede enviarnos un mensaje a travs de MyChart. Por lo general respondemos a los mensajes de MyChart en el transcurso de 1 a 2 das hbiles.  Para renovar recetas, por favor pida a su farmacia que se ponga en contacto con nuestra oficina. Nuestro nmero de fax es el [phone].  Si tiene un asunto urgente cuando la clnica est cerrada y que no puede esperar hasta el siguiente da hbil, puede llamar/localizar a su doctor(a) al nmero que aparece a continuacin.   Por favor, tenga en cuenta que aunque hacemos todo lo posible para estar disponibles para asuntos urgentes fuera del horario de oficina, no estamos disponibles las 24 horas del da, los 7 das de la semana.   Si tiene un problema urgente y no puede comunicarse con nosotros, puede  optar por buscar atencin mdica  en el consultorio de su doctor(a), en una clnica privada, en un centro de atencin urgente o en una sala de emergencias.  Si tiene una emergencia mdica, por favor llame inmediatamente al 911 o vaya a la sala de emergencias.  Nmeros de bper  - Dr. Kowalski: [phone]  - Dra. Moye: [phone]  - Dra. Stewart: [phone]  En caso de inclemencias del tiempo, por favor llame a nuestra lnea principal al [phone] para una actualizacin sobre el estado de cualquier retraso o cierre.  Consejos para la medicacin en dermatologa: Por favor, guarde las cajas en las que vienen los medicamentos de uso tpico para ayudarle a seguir las instrucciones sobre dnde y cmo usarlos. Las farmacias generalmente imprimen las instrucciones del medicamento slo en las cajas y no directamente en los tubos del medicamento.   Si su medicamento es muy caro, por favor, pngase en contacto con   nuestra oficina llamando al [phone] y presione la opcin 4 o envenos un mensaje a travs de MyChart.   No podemos decirle cul ser su copago por los medicamentos por adelantado ya que esto es diferente dependiendo de la cobertura de su seguro. Sin embargo, es posible que podamos encontrar un medicamento sustituto a menor costo o llenar un formulario para que el seguro cubra el medicamento que se considera necesario.   Si se requiere una autorizacin previa para que su compaa de seguros cubra su medicamento, por favor permtanos de 1 a 2 das hbiles para completar este proceso.  Los precios de los medicamentos varan con frecuencia dependiendo del lugar de dnde se surte la receta y alguna farmacias pueden ofrecer precios ms baratos.  El sitio web www.goodrx.com tiene cupones para medicamentos de diferentes farmacias. Los precios aqu no tienen en cuenta lo que podra costar con la ayuda del seguro (puede ser ms barato con su seguro), pero el sitio web puede darle el  precio si no utiliz ningn seguro.  - Puede imprimir el cupn correspondiente y llevarlo con su receta a la farmacia.  - Tambin puede pasar por nuestra oficina durante el horario de atencin regular y recoger una tarjeta de cupones de GoodRx.  - Si necesita que su receta se enve electrnicamente a una farmacia diferente, informe a nuestra oficina a travs de MyChart de [place] o por telfono llamando al [phone] y presione la opcin 4.  

## 2022-04-14 NOTE — Progress Notes (Unsigned)
Follow-Up Visit   Subjective  Todd Frank is a 67 y.o. male who presents for the following: Annual Exam (Hx MMIS, dysplastic nevi ). The patient presents for Total-Body Skin Exam (TBSE) for skin cancer screening and mole check.  The patient has spots, moles and lesions to be evaluated, some may be new or changing and the patient has concerns that these could be cancer.  The following portions of the chart were reviewed this encounter and updated as appropriate:   Tobacco  Allergies  Meds  Problems  Med Hx  Surg Hx  Fam Hx     Review of Systems:  No other skin or systemic complaints except as noted in HPI or Assessment and Plan.  Objective  Well appearing patient in no apparent distress; mood and affect are within normal limits.  A full examination was performed including scalp, head, eyes, ears, nose, lips, neck, chest, axillae, abdomen, back, buttocks, bilateral upper extremities, bilateral lower extremities, hands, feet, fingers, toes, fingernails, and toenails. All findings within normal limits unless otherwise noted below.  L upper back paraspinal 0.5 cm dark irregular brown macule.     R tricep x 2, L side x 1, R hip  x 1 (3) Erythematous stuck-on, waxy papule or plaque   Assessment & Plan  Neoplasm of uncertain behavior of skin L upper back paraspinal Epidermal / dermal shaving  Lesion diameter (cm):  0.5 Informed consent: discussed and consent obtained   Timeout: patient name, date of birth, surgical site, and procedure verified   Procedure prep:  Patient was prepped and draped in usual sterile fashion Prep type:  Isopropyl alcohol Anesthesia: the lesion was anesthetized in a standard fashion   Anesthetic:  1% lidocaine w/ epinephrine 1-100,000 buffered w/ 8.4% NaHCO3 Instrument used: flexible razor blade   Hemostasis achieved with: pressure, aluminum chloride and electrodesiccation   Outcome: patient tolerated procedure well   Post-procedure details:  sterile dressing applied and wound care instructions given   Dressing type: bandage and petrolatum    Specimen 1 - Surgical pathology Differential Diagnosis: D48.5 r/o dysplastic nevus  Hx MMIS Check Margins: No  Inflamed seborrheic keratosis (3) R tricep x 2, L side x 1, R hip  x 1 Symptomatic, irritating, patient would like treated. Destruction of lesion - R tricep x 2, L side x 1, R hip  x 1 Complexity: simple   Destruction method: cryotherapy   Informed consent: discussed and consent obtained   Timeout:  patient name, date of birth, surgical site, and procedure verified Lesion destroyed using liquid nitrogen: Yes   Region frozen until ice ball extended beyond lesion: Yes   Outcome: patient tolerated procedure well with no complications   Post-procedure details: wound care instructions given    Lentigines - Scattered tan macules - Due to sun exposure - Benign-appearing, observe - Recommend daily broad spectrum sunscreen SPF 30+ to sun-exposed areas, reapply every 2 hours as needed. - Call for any changes  Seborrheic Keratoses - Stuck-on, waxy, tan-brown papules and/or plaques  - Benign-appearing - Discussed benign etiology and prognosis. - Observe - Call for any changes  Melanocytic Nevi - Tan-brown and/or pink-flesh-colored symmetric macules and papules - Benign appearing on exam today - Observation - Call clinic for new or changing moles - Recommend daily use of broad spectrum spf 30+ sunscreen to sun-exposed areas.   Hemangiomas - Red papules - Discussed benign nature - Observe - Call for any changes  Actinic Damage - Chronic condition, secondary to  cumulative UV/sun exposure - diffuse scaly erythematous macules with underlying dyspigmentation - Recommend daily broad spectrum sunscreen SPF 30+ to sun-exposed areas, reapply every 2 hours as needed.  - Staying in the shade or wearing long sleeves, sun glasses (UVA+UVB protection) and wide brim hats (4-inch brim  around the entire circumference of the hat) are also recommended for sun protection.  - Call for new or changing lesions.  History of Melanoma in Situ - No evidence of recurrence today - Recommend regular full body skin exams - Recommend daily broad spectrum sunscreen SPF 30+ to sun-exposed areas, reapply every 2 hours as needed.  - Call if any new or changing lesions are noted between office visits  History of Dysplastic Nevi - No evidence of recurrence today - Recommend regular full body skin exams - Recommend daily broad spectrum sunscreen SPF 30+ to sun-exposed areas, reapply every 2 hours as needed.  - Call if any new or changing lesions are noted between office visits  Skin cancer screening performed today.  Return in about 1 year (around 04/15/2023) for TBSE.  Todd Frank, CMA, am acting as scribe for Sarina Ser, MD . Documentation: I have reviewed the above documentation for accuracy and completeness, and I agree with the above.  Sarina Ser, MD

## 2022-04-15 ENCOUNTER — Encounter: Payer: Self-pay | Admitting: Dermatology

## 2022-04-15 NOTE — Progress Notes (Signed)
Carelink Summary Report / Loop Recorder 

## 2022-04-17 ENCOUNTER — Telehealth: Payer: Self-pay

## 2022-04-17 NOTE — Telephone Encounter (Signed)
Discussed biopsy results with patient   left upper back paraspinal DYSPLASTIC JUNCTIONAL LENTIGINOUS NEVUS WITH MODERATE TO SEVERE ATYPIA, IRRITATED, AND HALO NEVUS EFFECT. MARGIN CLOSE, SEE DESCRIPTION

## 2022-05-01 ENCOUNTER — Telehealth: Payer: Medicare PPO | Admitting: Physician Assistant

## 2022-05-01 ENCOUNTER — Encounter: Payer: Self-pay | Admitting: Family Medicine

## 2022-05-01 DIAGNOSIS — U071 COVID-19: Secondary | ICD-10-CM

## 2022-05-01 MED ORDER — MOLNUPIRAVIR EUA 200MG CAPSULE: 4.0000 | ORAL_CAPSULE | Freq: Two times a day (BID) | ORAL | 0 refills | Status: AC

## 2022-05-01 MED ORDER — BENZONATATE 100 MG PO CAPS: 100.0000 mg | ORAL_CAPSULE | Freq: Three times a day (TID) | ORAL | 0 refills | Status: DC | PRN

## 2022-05-01 NOTE — Progress Notes (Signed)
Virtual Visit Consent   MCADOO Frank, you are scheduled for a virtual visit with a [facility] provider today. Just as with appointments in the office, your consent must be obtained to participate. Your consent will be active for this visit and any virtual visit you may have with one of our providers in the next 365 days. If you have a MyChart account, a copy of this consent can be sent to you electronically.  As this is a virtual visit, video technology does not allow for your provider to perform a traditional examination. This may limit your provider's ability to fully assess your condition. If your provider identifies any concerns that need to be evaluated in person or the need to arrange testing (such as labs, EKG, etc.), we will make arrangements to do so. Although advances in technology are sophisticated, we cannot ensure that it will always work on either your end or our end. If the connection with a video visit is poor, the visit may have to be switched to a telephone visit. With either a video or telephone visit, we are not always able to ensure that we have a secure connection.  By engaging in this virtual visit, you consent to the provision of healthcare and authorize for your insurance to be billed (if applicable) for the services provided during this visit. Depending on your insurance coverage, you may receive a charge related to this service.  I need to obtain your verbal consent now. Are you willing to proceed with your visit today? Todd Frank has provided verbal consent on 05/01/2022 for a virtual visit (video or telephone). Todd Frank, [place]  Date: 05/01/2022 10:43 AM  Virtual Visit via Video Note   I, Todd Frank, connected with  Todd Frank  ([id number], 06/02/1955) on 05/01/22 at 10:30 AM EST by a video-enabled telemedicine application and verified that I am speaking with the correct person using two identifiers.  Location: Patient: Virtual Visit Location  Patient: Home Provider: Virtual Visit Location Provider: Home Office   I discussed the limitations of evaluation and management by telemedicine and the availability of in person appointments. The patient expressed understanding and agreed to proceed.    History of Present Illness: Todd Frank is a 67 y.o. who identifies as a male who was assigned male at birth, and is being seen today for COVID-19. Symptoms started Monday evening into Tuesday with aching. By Tuesday morning with sore throat, chest congestion/cough. Notes cough causing some chest wall tenderness. Initially took a COVID test at home on Monday that was negative. Symptoms continued to progress -- still with aching and continued congestion. Wife started with symptoms yesterday and tested positive for COVID so he retested today and was also positive. Is taking OTC decongestants and Aleve.Marland Kitchen   HPI: HPI  Problems:  Patient Active Problem List   Diagnosis Date Noted   Primary osteoarthritis of left knee 03/17/2022   Unconjugated benign bilirubinemia 02/18/2021   Aneurysm of thoracic aorta (Todd Frank) 09/13/2020   GAD (generalized anxiety disorder) 02/17/2020   Bradycardia, drug induced 04/27/2018   OSA on CPAP 02/12/2018   Vitamin D deficiency 12/18/2017   Thrombocytopenia (Todd Frank) 11/13/2017   Chronic frontal sinusitis 09/08/2017   S/P aortic valve replacement with bioprosthetic valve 06/25/2017   S/P CABG x 2 06/25/2017   Syncope and collapse 05/31/2017   CAD (coronary artery disease), native coronary artery    Carotid bruit    Abnormal nuclear stress test 02/20/2016   Chronic  low back pain with right-sided sciatica 02/04/2016   BMI 24.0-24.9, adult 02/04/2016   Prostate cancer screening 02/04/2016   Exposure to hepatitis C 02/04/2016   History of melanoma excision 01/09/2016   Colon cancer screening 01/09/2016   Bradycardia 07/23/2015   Allergic rhinitis 05/14/2015   ED (erectile dysfunction) 02/02/2015   Hamstring tightness  12/02/2013   Fasciculations of muscle 12/02/2013   Piriformis syndrome of right side 11/01/2013   Iliotibial band syndrome 10/11/2013   Severe aortic stenosis 01/26/2013   Essential hypertension 01/26/2013   Hypercholesteremia    LVH (left ventricular hypertrophy)    PFO (patent foramen ovale)    Diastolic dysfunction    GERD (gastroesophageal reflux disease)    Dilated aortic root (HCC)     Allergies: No Known Allergies Medications:  Current Outpatient Medications:    benzonatate (TESSALON) 100 MG capsule, Take 1 capsule (100 mg total) by mouth 3 (three) times daily as needed for cough., Disp: 30 capsule, Rfl: 0   molnupiravir EUA (LAGEVRIO) 200 mg CAPS capsule, Take 4 capsules (800 mg total) by mouth 2 (two) times daily for 5 days., Disp: 40 capsule, Rfl: 0   amLODipine (NORVASC) 2.5 MG tablet, Take 1 tablet (2.5 mg total) by mouth daily., Disp: 90 tablet, Rfl: 1   aspirin EC 81 MG tablet, Take 81 mg by mouth daily., Disp: , Rfl:    atorvastatin (LIPITOR) 80 MG tablet, Take 1 tablet (80 mg total) by mouth at bedtime., Disp: 90 tablet, Rfl: 3   cholecalciferol (VITAMIN D) 1000 units tablet, Take 1,000 Units by mouth every evening., Disp: , Rfl:    Coenzyme Q10 (COQ-10) 200 MG CAPS, Take 200 mg by mouth daily., Disp: , Rfl:   Observations/Objective: Patient is well-developed, well-nourished in no acute distress.  Resting comfortably at home.  Head is normocephalic, atraumatic.  No labored breathing. Speech is clear and coherent with logical content.  Patient is alert and oriented at baseline.   Assessment and Plan: 1. COVID-19 - benzonatate (TESSALON) 100 MG capsule; Take 1 capsule (100 mg total) by mouth 3 (three) times daily as needed for cough.  Dispense: 30 capsule; Refill: 0 - molnupiravir EUA (LAGEVRIO) 200 mg CAPS capsule; Take 4 capsules (800 mg total) by mouth 2 (two) times daily for 5 days.  Dispense: 40 capsule; Refill: 0  Patient with multiple risk factors for  complicated course of illness. Discussed risks/benefits of antiviral medications including most common potential ADRs. Patient voiced understanding and would like to proceed with antiviral medication. They are candidate for both antivirals but giving cardiac history and need to remain on statin, will start Molnupiravir. Rx sent to pharmacy. Supportive measures, OTC medications and vitamin regimen reviewed. Tessalon per orders.Quarantine reviewed in detail. Strict ER precautions discussed with patient.    Follow Up Instructions: I discussed the assessment and treatment plan with the patient. The patient was provided an opportunity to ask questions and all were answered. The patient agreed with the plan and demonstrated an understanding of the instructions.  A copy of instructions were sent to the patient via MyChart unless otherwise noted below.   The patient was advised to call back or seek an in-person evaluation if the symptoms worsen or if the condition fails to improve as anticipated.  Time:  I spent 10 minutes with the patient via telehealth technology discussing the above problems/concerns.    Todd Rio, PA-C

## 2022-05-01 NOTE — Patient Instructions (Addendum)
[name], thank you for joining [name], PA-C for today's virtual visit.  While this provider is not your primary care provider (PCP), if your PCP is located in our provider database this encounter information will be shared with them immediately following your visit.   [facility] account gives you access to today's visit and all your visits, tests, and labs performed at [facility] " click here if you don't have a [facility] account or go to [email or web address]  Consent: (Patient) Todd Frank provided verbal consent for this virtual visit at the beginning of the encounter.  Current Medications:  Current Outpatient Medications:    amLODipine (NORVASC) 2.5 MG tablet, Take 1 tablet (2.5 mg total) by mouth daily., Disp: 90 tablet, Rfl: 1   aspirin EC 81 MG tablet, Take 81 mg by mouth daily., Disp: , Rfl:    atorvastatin (LIPITOR) 80 MG tablet, Take 1 tablet (80 mg total) by mouth at bedtime., Disp: 90 tablet, Rfl: 3   cholecalciferol (VITAMIN D) 1000 units tablet, Take 1,000 Units by mouth every evening., Disp: , Rfl:    Coenzyme Q10 (COQ-10) 200 MG CAPS, Take 200 mg by mouth daily., Disp: , Rfl:    Medications ordered in this encounter:  No orders of the defined types were placed in this encounter.    *If you need refills on other medications prior to your next appointment, please contact your pharmacy*  Follow-Up: Call back or seek an in-person evaluation if the symptoms worsen or if the condition fails to improve as anticipated.  [place] [phone]  Other Instructions Please keep well-hydrated and get plenty of rest. Start a saline nasal rinse to flush out your nasal passages. You can use plain Mucinex to help thin congestion. If you have a humidifier, running in the bedroom at night. I want you to start OTC vitamin D3 1000 units daily, vitamin C 1000 mg daily, and a zinc supplement. Please take  prescribed medications as directed.  You have been enrolled in a MyChart symptom monitoring program. Please answer these questions daily so we can keep track of how you are doing.  You were to quarantine for 5 days from onset of your symptoms.  After day 5, if you have had no fever and you are feeling better, you can end quarantine but need to mask for an additional 5 days. After day 5 if you have a fever or are having significant symptoms, please quarantine for full 10 days.  If you note any worsening of symptoms, any significant shortness of breath or any chest pain, please seek ER evaluation ASAP.  Please do not delay care!  COVID-19: What to Do if You Are Sick If you test positive and are an older adult or someone who is at high risk of getting very sick from COVID-19, treatment may be available. Contact a healthcare provider right away after a positive test to determine if you are eligible, even if your symptoms are mild right now. You can also visit a Test to Treat location and, if eligible, receive a prescription from a provider. Don't delay: Treatment must be started within the first few days to be effective. If you have a fever, cough, or other symptoms, you might have COVID-19. Most people have mild illness and are able to recover at home. If you are sick: Keep track of your symptoms. If you have an emergency warning sign (including trouble breathing), call 911. Steps to  help prevent the spread of COVID-19 if you are sick If you are sick with COVID-19 or think you might have COVID-19, follow the steps below to care for yourself and to help protect other people in your home and community. Stay home except to get medical care Stay home. Most people with COVID-19 have mild illness and can recover at home without medical care. Do not leave your home, except to get medical care. Do not visit public areas and do not go to places where you are unable to wear a mask. Take care of yourself. Get  rest and stay hydrated. Take over-the-counter medicines, such as acetaminophen, to help you feel better. Stay in touch with your doctor. Call before you get medical care. Be sure to get care if you have trouble breathing, or have any other emergency warning signs, or if you think it is an emergency. Avoid public transportation, ride-sharing, or taxis if possible. Get tested If you have symptoms of COVID-19, get tested. While waiting for test results, stay away from others, including staying apart from those living in your household. Get tested as soon as possible after your symptoms start. Treatments may be available for people with COVID-19 who are at risk for becoming very sick. Don't delay: Treatment must be started early to be effective--some treatments must begin within 5 days of your first symptoms. Contact your healthcare provider right away if your test result is positive to determine if you are eligible. Self-tests are one of several options for testing for the virus that causes COVID-19 and may be more convenient than laboratory-based tests and point-of-care tests. Ask your healthcare provider or your local health department if you need help interpreting your test results. You can visit your state, tribal, local, and territorial health department's website to look for the latest local information on testing sites. Separate yourself from other people As much as possible, stay in a specific room and away from other people and pets in your home. If possible, you should use a separate bathroom. If you need to be around other people or animals in or outside of the home, wear a well-fitting mask. Tell your close contacts that they may have been exposed to COVID-19. An infected person can spread COVID-19 starting 48 hours (or 2 days) before the person has any symptoms or tests positive. By letting your close contacts know they may have been exposed to COVID-19, you are helping to protect everyone. See  COVID-19 and Animals if you have questions about pets. If you are diagnosed with COVID-19, someone from the health department may call you. Answer the call to slow the spread. Monitor your symptoms Symptoms of COVID-19 include fever, cough, or other symptoms. Follow care instructions from your healthcare provider and local health department. Your local health authorities may give instructions on checking your symptoms and reporting information. When to seek emergency medical attention Look for emergency warning signs* for COVID-19. If someone is showing any of these signs, seek emergency medical care immediately: Trouble breathing Persistent pain or pressure in the chest New confusion Inability to wake or stay awake Pale, gray, or blue-colored skin, lips, or nail beds, depending on skin tone *This list is not all possible symptoms. Please call your medical provider for any other symptoms that are severe or concerning to you. Call 911 or call ahead to your local emergency facility: Notify the operator that you are seeking care for someone who has or may have COVID-19. Call ahead before visiting your doctor  Call ahead. Many medical visits for routine care are being postponed or done by phone or telemedicine. If you have a medical appointment that cannot be postponed, call your doctor's office, and tell them you have or may have COVID-19. This will help the office protect themselves and other patients. If you are sick, wear a well-fitting mask You should wear a mask if you must be around other people or animals, including pets (even at home). Wear a mask with the best fit, protection, and comfort for you. You don't need to wear the mask if you are alone. If you can't put on a mask (because of trouble breathing, for example), cover your coughs and sneezes in some other way. Try to stay at least 6 feet away from other people. This will help protect the people around you. Masks should not be placed on  young children under age 75 years, anyone who has trouble breathing, or anyone who is not able to remove the mask without help. Cover your coughs and sneezes Cover your mouth and nose with a tissue when you cough or sneeze. Throw away used tissues in a lined trash can. Immediately wash your hands with soap and water for at least 20 seconds. If soap and water are not available, clean your hands with an alcohol-based hand sanitizer that contains at least 60% alcohol. Clean your hands often Wash your hands often with soap and water for at least 20 seconds. This is especially important after blowing your nose, coughing, or sneezing; going to the bathroom; and before eating or preparing food. Use hand sanitizer if soap and water are not available. Use an alcohol-based hand sanitizer with at least 60% alcohol, covering all surfaces of your hands and rubbing them together until they feel dry. Soap and water are the best option, especially if hands are visibly dirty. Avoid touching your eyes, nose, and mouth with unwashed hands. Handwashing Tips Avoid sharing personal household items Do not share dishes, drinking glasses, cups, eating utensils, towels, or bedding with other people in your home. Wash these items thoroughly after using them with soap and water or put in the dishwasher. Clean surfaces in your home regularly Clean and disinfect high-touch surfaces (for example, doorknobs, tables, handles, light switches, and countertops) in your "sick room" and bathroom. In shared spaces, you should clean and disinfect surfaces and items after each use by the person who is ill. If you are sick and cannot clean, a caregiver or other person should only clean and disinfect the area around you (such as your bedroom and bathroom) on an as needed basis. Your caregiver/other person should wait as long as possible (at least several hours) and wear a mask before entering, cleaning, and disinfecting shared spaces that you  use. Clean and disinfect areas that may have blood, stool, or body fluids on them. Use household cleaners and disinfectants. Clean visible dirty surfaces with household cleaners containing soap or detergent. Then, use a household disinfectant. Use a product from [employer] List N: Disinfectants for Coronavirus (MWNUU-72). Be sure to follow the instructions on the label to ensure safe and effective use of the product. Many products recommend keeping the surface wet with a disinfectant for a certain period of time (look at "contact time" on the product label). You may also need to wear personal protective equipment, such as gloves, depending on the directions on the product label. Immediately after disinfecting, wash your hands with soap and water for 20 seconds. For completed guidance on cleaning  and disinfecting your home, visit Complete Disinfection Guidance. Take steps to improve ventilation at home Improve ventilation (air flow) at home to help prevent from spreading COVID-19 to other people in your household. Clear out COVID-19 virus particles in the air by opening windows, using air filters, and turning on fans in your home. Use this interactive tool to learn how to improve air flow in your home. When you can be around others after being sick with COVID-19 Deciding when you can be around others is different for different situations. Find out when you can safely end home isolation. For any additional questions about your care, contact your healthcare provider or state or local health department. 06/26/2020 Content source: [facility] for Immunization and Respiratory Diseases ([facility]), Division of Viral Diseases This information is not intended to replace advice given to you by your health care provider. Make sure you discuss any questions you have with your health care provider. Document Revised: 08/09/2020 Document Reviewed: 08/09/2020 Elsevier Patient Education  2022 [employer].   If you  have been instructed to have an in-person evaluation today at a local Urgent Care facility, please use the link below. It will take you to a list of all of our available [facility], including address, phone number and hours of operation. Please do not delay care.  [facility]  If you or a family member do not have a primary care provider, use the link below to schedule a visit and establish care. When you choose a [facility] primary care physician or advanced practice provider, you gain a long-term partner in health. Find a Primary Care Provider  Learn more about [facility]'s in-office and virtual care options: [facility]

## 2022-05-11 LAB — CUP PACEART REMOTE DEVICE CHECK
Date Time Interrogation Session: 20240203231625
Implantable Pulse Generator Implant Date: 20220831

## 2022-05-12 ENCOUNTER — Ambulatory Visit: Payer: Medicare PPO

## 2022-05-12 DIAGNOSIS — R55 Syncope and collapse: Secondary | ICD-10-CM

## 2022-05-13 NOTE — Progress Notes (Signed)
Carelink Summary Report / Loop Recorder 

## 2022-05-25 ENCOUNTER — Encounter: Payer: Self-pay | Admitting: Family Medicine

## 2022-05-25 DIAGNOSIS — I10 Essential (primary) hypertension: Secondary | ICD-10-CM

## 2022-05-26 MED ORDER — AMLODIPINE BESYLATE 5 MG PO TABS: 5.0000 mg | ORAL_TABLET | Freq: Every day | ORAL | 3 refills | Status: DC

## 2022-06-16 ENCOUNTER — Ambulatory Visit (INDEPENDENT_AMBULATORY_CARE_PROVIDER_SITE_OTHER): Payer: Medicare PPO

## 2022-06-16 DIAGNOSIS — R55 Syncope and collapse: Secondary | ICD-10-CM | POA: Diagnosis not present

## 2022-06-18 LAB — CUP PACEART REMOTE DEVICE CHECK
Date Time Interrogation Session: 20240310231718
Implantable Pulse Generator Implant Date: 20220831

## 2022-06-30 NOTE — Progress Notes (Signed)
Carelink Summary Report / Loop Recorder 

## 2022-07-21 ENCOUNTER — Ambulatory Visit (INDEPENDENT_AMBULATORY_CARE_PROVIDER_SITE_OTHER): Payer: Medicare PPO

## 2022-07-21 DIAGNOSIS — R55 Syncope and collapse: Secondary | ICD-10-CM

## 2022-07-22 LAB — CUP PACEART REMOTE DEVICE CHECK
Date Time Interrogation Session: 20240414231321
Implantable Pulse Generator Implant Date: 20220831

## 2022-07-29 NOTE — Progress Notes (Signed)
Carelink Summary Report / Loop Recorder 

## 2022-08-25 ENCOUNTER — Ambulatory Visit (INDEPENDENT_AMBULATORY_CARE_PROVIDER_SITE_OTHER): Payer: Medicare PPO

## 2022-08-25 DIAGNOSIS — R55 Syncope and collapse: Secondary | ICD-10-CM

## 2022-08-25 LAB — CUP PACEART REMOTE DEVICE CHECK
Date Time Interrogation Session: 20240517230648
Implantable Pulse Generator Implant Date: 20220831

## 2022-08-26 ENCOUNTER — Encounter: Payer: Self-pay | Admitting: Cardiology

## 2022-08-27 NOTE — Progress Notes (Signed)
Carelink Summary Report / Loop Recorder 

## 2022-09-11 ENCOUNTER — Ambulatory Visit (HOSPITAL_COMMUNITY): Payer: Medicare PPO | Attending: Internal Medicine

## 2022-09-11 DIAGNOSIS — I350 Nonrheumatic aortic (valve) stenosis: Secondary | ICD-10-CM | POA: Diagnosis not present

## 2022-09-11 DIAGNOSIS — I7121 Aneurysm of the ascending aorta, without rupture: Secondary | ICD-10-CM | POA: Insufficient documentation

## 2022-09-11 LAB — ECHOCARDIOGRAM COMPLETE
AR max vel: 1.09 cm2
AV Area VTI: 1.14 cm2
AV Area mean vel: 1.12 cm2
AV Mean grad: 24 mmHg
AV Peak grad: 39.4 mmHg
Ao pk vel: 3.14 m/s
Area-P 1/2: 2.25 cm2
S' Lateral: 3.5 cm

## 2022-09-12 ENCOUNTER — Encounter (HOSPITAL_COMMUNITY): Payer: Self-pay

## 2022-09-12 ENCOUNTER — Encounter: Payer: Self-pay | Admitting: Cardiology

## 2022-09-12 ENCOUNTER — Other Ambulatory Visit: Payer: Self-pay | Admitting: *Deleted

## 2022-09-12 DIAGNOSIS — I350 Nonrheumatic aortic (valve) stenosis: Secondary | ICD-10-CM

## 2022-09-12 DIAGNOSIS — Z953 Presence of xenogenic heart valve: Secondary | ICD-10-CM

## 2022-09-17 ENCOUNTER — Ambulatory Visit: Payer: Medicare PPO | Admitting: Dermatology

## 2022-09-17 VITALS — BP 132/75

## 2022-09-17 DIAGNOSIS — Z86018 Personal history of other benign neoplasm: Secondary | ICD-10-CM

## 2022-09-17 DIAGNOSIS — L814 Other melanin hyperpigmentation: Secondary | ICD-10-CM | POA: Diagnosis not present

## 2022-09-17 DIAGNOSIS — L578 Other skin changes due to chronic exposure to nonionizing radiation: Secondary | ICD-10-CM

## 2022-09-17 DIAGNOSIS — Z86006 Personal history of melanoma in-situ: Secondary | ICD-10-CM

## 2022-09-17 DIAGNOSIS — L821 Other seborrheic keratosis: Secondary | ICD-10-CM | POA: Diagnosis not present

## 2022-09-17 DIAGNOSIS — Z1283 Encounter for screening for malignant neoplasm of skin: Secondary | ICD-10-CM

## 2022-09-17 DIAGNOSIS — Z85820 Personal history of malignant melanoma of skin: Secondary | ICD-10-CM

## 2022-09-17 DIAGNOSIS — W908XXA Exposure to other nonionizing radiation, initial encounter: Secondary | ICD-10-CM | POA: Diagnosis not present

## 2022-09-17 DIAGNOSIS — X32XXXA Exposure to sunlight, initial encounter: Secondary | ICD-10-CM

## 2022-09-17 DIAGNOSIS — D225 Melanocytic nevi of trunk: Secondary | ICD-10-CM | POA: Diagnosis not present

## 2022-09-17 DIAGNOSIS — D229 Melanocytic nevi, unspecified: Secondary | ICD-10-CM

## 2022-09-17 NOTE — Progress Notes (Signed)
   Follow-Up Visit   Subjective  Todd Frank is a 67 y.o. male who presents for the following: Recheck Dysplastic Nevus moderate to severe, L upper back paraspinal 89m f/u The patient has spots, moles and lesions to be evaluated, some may be new or changing and the patient may have concern these could be cancer.  The following portions of the chart were reviewed this encounter and updated as appropriate: medications, allergies, medical history  Review of Systems:  No other skin or systemic complaints except as noted in HPI or Assessment and Plan.  Objective  Well appearing patient in no apparent distress; mood and affect are within normal limits. A focused examination was performed of the following areas: Upper body skin exam. Relevant exam findings are noted in the Assessment and Plan.   Assessment & Plan  HISTORY OF DYSPLASTIC NEVUS MODERATE TO SEVERE ATYPIA Exam: L upper back paraspinal with pink bx site Treatment Plan: Clear. Observe for recurrence. Call clinic for new or changing lesions.  Recommend regular skin exams, daily broad-spectrum spf 30+ sunscreen use, and photoprotection.      SEBORRHEIC KERATOSIS - Stuck-on, waxy, tan-brown papules and/or plaques  - Benign-appearing - Discussed benign etiology and prognosis. - Observe - Call for any changes  LENTIGINES Exam: scattered tan macules Due to sun exposure Treatment Plan: Benign-appearing, observe. Recommend daily broad spectrum sunscreen SPF 30+ to sun-exposed areas, reapply every 2 hours as needed.  Call for any changes  HEMANGIOMA Exam: red papule(s) Discussed benign nature. Recommend observation. Call for changes.   ACTINIC DAMAGE - chronic, secondary to cumulative UV radiation exposure/sun exposure over time - diffuse scaly erythematous macules with underlying dyspigmentation - Recommend daily broad spectrum sunscreen SPF 30+ to sun-exposed areas, reapply every 2 hours as needed.  - Recommend staying in  the shade or wearing long sleeves, sun glasses (UVA+UVB protection) and wide brim hats (4-inch brim around the entire circumference of the hat). - Call for new or changing lesions.   MELANOCYTIC NEVI Exam: Tan-brown and/or pink-flesh-colored symmetric macules and papules trunk  Treatment Plan: Benign appearing on exam today. Recommend observation. Call clinic for new or changing moles. Recommend daily use of broad spectrum spf 30+ sunscreen to sun-exposed areas.    HISTORY OF DYSPLASTIC NEVUS No evidence of recurrence today Recommend regular full body skin exams Recommend daily broad spectrum sunscreen SPF 30+ to sun-exposed areas, reapply every 2 hours as needed.  Call if any new or changing lesions are noted between office visits  - multiple  HISTORY OF MELANOMA IN SITU - No evidence of recurrence today - Recommend regular full body skin exams - Recommend daily broad spectrum sunscreen SPF 30+ to sun-exposed areas, reapply every 2 hours as needed.  - Call if any new or changing lesions are noted between office visits  - L sup thigh  Return for as scheduled for TBSE, hx of Melanoma IS, Dysplastic Nevi.  I, Ardis Rowan, RMA, am acting as scribe for Armida Sans, MD .  Documentation: I have reviewed the above documentation for accuracy and completeness, and I agree with the above.  Armida Sans, MD

## 2022-09-17 NOTE — Patient Instructions (Signed)
Due to recent changes in healthcare laws, you may see results of your pathology and/or laboratory studies on MyChart before the doctors have had a chance to review them. We understand that in some cases there may be results that are confusing or concerning to you. Please understand that not all results are received at the same time and often the doctors may need to interpret multiple results in order to provide you with the best plan of care or course of treatment. Therefore, we ask that you please give us 2 business days to thoroughly review all your results before contacting the office for clarification. Should we see a critical lab result, you will be contacted sooner.   If You Need Anything After Your Visit  If you have any questions or concerns for your doctor, please call our main line at [phone] and press option 4 to reach your doctor's medical assistant. If no one answers, please leave a voicemail as directed and we will return your call as soon as possible. Messages left after 4 pm will be answered the following business day.   You may also send us a message via MyChart. We typically respond to MyChart messages within 1-2 business days.  For prescription refills, please ask your pharmacy to contact our office. Our fax number is [phone].  If you have an urgent issue when the clinic is closed that cannot wait until the next business day, you can page your doctor at the number below.    Please note that while we do our best to be available for urgent issues outside of office hours, we are not available 24/7.   If you have an urgent issue and are unable to reach us, you may choose to seek medical care at your doctor's office, retail clinic, urgent care center, or emergency room.  If you have a medical emergency, please immediately call 911 or go to the emergency department.  Pager Numbers  - Dr. Kowalski: [phone]  - Dr. Moye: [phone]  - Dr. Stewart:  [phone]  In the event of inclement weather, please call our main line at [phone] for an update on the status of any delays or closures.  Dermatology Medication Tips: Please keep the boxes that topical medications come in in order to help keep track of the instructions about where and how to use these. Pharmacies typically print the medication instructions only on the boxes and not directly on the medication tubes.   If your medication is too expensive, please contact our office at [phone] option 4 or send us a message through MyChart.   We are unable to tell what your co-pay for medications will be in advance as this is different depending on your insurance coverage. However, we may be able to find a substitute medication at lower cost or fill out paperwork to get insurance to cover a needed medication.   If a prior authorization is required to get your medication covered by your insurance company, please allow us 1-2 business days to complete this process.  Drug prices often vary depending on where the prescription is filled and some pharmacies may offer cheaper prices.  The website www.goodrx.com contains coupons for medications through different pharmacies. The prices here do not account for what the cost may be with help from insurance (it may be cheaper with your insurance), but the website can give you the price if you did not use any insurance.  - You can print the associated coupon and take it with   your prescription to the pharmacy.  - You may also stop by our office during regular business hours and pick up a GoodRx coupon card.  - If you need your prescription sent electronically to a different pharmacy, notify our office through [facility] MyChart or by phone at [phone] option 4.     Si Usted Necesita Algo Despus de Su Visita  Tambin puede enviarnos un mensaje a travs de MyChart. Por lo general respondemos a los mensajes de MyChart en el transcurso de 1 a 2  das hbiles.  Para renovar recetas, por favor pida a su farmacia que se ponga en contacto con nuestra oficina. Nuestro nmero de fax es el [phone].  Si tiene un asunto urgente cuando la clnica est cerrada y que no puede esperar hasta el siguiente da hbil, puede llamar/localizar a su doctor(a) al nmero que aparece a continuacin.   Por favor, tenga en cuenta que aunque hacemos todo lo posible para estar disponibles para asuntos urgentes fuera del horario de oficina, no estamos disponibles las 24 horas del da, los 7 das de la semana.   Si tiene un problema urgente y no puede comunicarse con nosotros, puede optar por buscar atencin mdica  en el consultorio de su doctor(a), en una clnica privada, en un centro de atencin urgente o en una sala de emergencias.  Si tiene una emergencia mdica, por favor llame inmediatamente al 911 o vaya a la sala de emergencias.  Nmeros de bper  - Dr. Kowalski: [phone]  - Dra. Moye: [phone]  - Dra. Stewart: [phone]  En caso de inclemencias del tiempo, por favor llame a nuestra lnea principal al [phone] para una actualizacin sobre el estado de cualquier retraso o cierre.  Consejos para la medicacin en dermatologa: Por favor, guarde las cajas en las que vienen los medicamentos de uso tpico para ayudarle a seguir las instrucciones sobre dnde y cmo usarlos. Las farmacias generalmente imprimen las instrucciones del medicamento slo en las cajas y no directamente en los tubos del medicamento.   Si su medicamento es muy caro, por favor, pngase en contacto con nuestra oficina llamando al [phone] y presione la opcin 4 o envenos un mensaje a travs de MyChart.   No podemos decirle cul ser su copago por los medicamentos por adelantado ya que esto es diferente dependiendo de la cobertura de su seguro. Sin embargo, es posible que podamos encontrar un medicamento sustituto a menor costo o llenar un formulario para que el  seguro cubra el medicamento que se considera necesario.   Si se requiere una autorizacin previa para que su compaa de seguros cubra su medicamento, por favor permtanos de 1 a 2 das hbiles para completar este proceso.  Los precios de los medicamentos varan con frecuencia dependiendo del lugar de dnde se surte la receta y alguna farmacias pueden ofrecer precios ms baratos.  El sitio web www.goodrx.com tiene cupones para medicamentos de diferentes farmacias. Los precios aqu no tienen en cuenta lo que podra costar con la ayuda del seguro (puede ser ms barato con su seguro), pero el sitio web puede darle el precio si no utiliz ningn seguro.  - Puede imprimir el cupn correspondiente y llevarlo con su receta a la farmacia.  - Tambin puede pasar por nuestra oficina durante el horario de atencin regular y recoger una tarjeta de cupones de GoodRx.  - Si necesita que su receta se enve electrnicamente a una farmacia diferente, informe a nuestra oficina a travs de MyChart de [facility]   o por telfono llamando al [phone] y presione la opcin 4.  

## 2022-09-18 DIAGNOSIS — M7138 Other bursal cyst, other site: Secondary | ICD-10-CM | POA: Diagnosis not present

## 2022-09-18 DIAGNOSIS — Z6826 Body mass index (BMI) 26.0-26.9, adult: Secondary | ICD-10-CM | POA: Diagnosis not present

## 2022-09-19 ENCOUNTER — Encounter: Payer: Self-pay | Admitting: Cardiology

## 2022-09-19 ENCOUNTER — Ambulatory Visit: Payer: Medicare PPO | Attending: Cardiology | Admitting: Cardiology

## 2022-09-19 ENCOUNTER — Encounter: Payer: Self-pay | Admitting: Dermatology

## 2022-09-19 VITALS — BP 132/78 | HR 62 | Ht 69.5 in | Wt 179.2 lb

## 2022-09-19 DIAGNOSIS — E7800 Pure hypercholesterolemia, unspecified: Secondary | ICD-10-CM | POA: Diagnosis not present

## 2022-09-19 DIAGNOSIS — I350 Nonrheumatic aortic (valve) stenosis: Secondary | ICD-10-CM

## 2022-09-19 DIAGNOSIS — I77810 Thoracic aortic ectasia: Secondary | ICD-10-CM | POA: Diagnosis not present

## 2022-09-19 DIAGNOSIS — I10 Essential (primary) hypertension: Secondary | ICD-10-CM | POA: Diagnosis not present

## 2022-09-19 DIAGNOSIS — G4733 Obstructive sleep apnea (adult) (pediatric): Secondary | ICD-10-CM

## 2022-09-19 DIAGNOSIS — I2510 Atherosclerotic heart disease of native coronary artery without angina pectoris: Secondary | ICD-10-CM | POA: Diagnosis not present

## 2022-09-19 DIAGNOSIS — R55 Syncope and collapse: Secondary | ICD-10-CM

## 2022-09-19 NOTE — Progress Notes (Addendum)
Date:  09/19/2022   ID:  Todd Frank, DOB May 30, 1955, MRN [id number]  PCP:  Smitty Cords, DO  Cardiologist:  Armanda Magic, MD  Electrophysiologist:  Lanier Prude, MD  Chief Complaint:  CAD, bicuspid AV, OSA, HTN, HLD  History of Present Illness:    Todd Frank is a 67 y.o. male with a hx of HTN, dyslipidemia, bicuspid AV, diastolic dysfunction, dilated aorta, noted prior small PFO and 2 vessel CAD. He was found to have progression of AS after a syncopal episode while jogging. Echocardiogram was obtained and showed an increased gradient across his bicuspid aortic valve which had progressed from previous study. Cardiac catheterization was also performed and showed severe 2 vessel CAD. He underwent CABG x 2 utilizing LIMA to LAD, and SVG to distal PDA, AVR with a 23 mm [employer]. Post op echo showed LVEF 50-55%, no pericardial effusion, no regional wall motion abnormalities and stable AVR.   Due to excessive daytime sleepiness he was also found to have mild obstructive sleep apnea with an AHI of 6.5/h and no significant central sleep apnea and is on CPAP.  He has had problems ever since his bypass surgery with chronic chest pain that had been present ever since his CABG.  ESR and CRP were normal and Lexiscan myoview showed no ischemia.  He did have an episode of syncope and saw EP and felt that he had neurocardiogenic syncope but an ILR was placed with only atrial triplet and PVC noted thus far.   He is here today for followup and is doing well.  He denies any chest pain or pressure, SOB, DOE, PND, orthopnea, LE edema, dizziness, palpitations or syncope. He is compliant with his meds and is tolerating meds with no SE.   He is walking 4 miles daily without any cardiac symptoms at all.  He has had some lumbar back issues recently and has seen Dr. Jordan Likes with neurosurgery and is getting an MRI.  He is doing well with his PAP device and thinks that he has gotten used  to it.  He tolerates the mask and feels the pressure is adequate.  Since going on PAP he feels rested in the am and has no significant daytime sleepiness.  He denies any significant nasal dryness or nasal congestion. He has some mouth dryness at times with the nasal pillow mask but uses the chin strap.  He does not think that he snores.    Prior CV studies:   The following studies were reviewed today:  PAP compliance download, chest CTA from 2022/2023, 2D echo from 09/2022  Past Medical History:  Diagnosis Date   Aortic stenosis    a. syncope/severe AS s/p bioprosthetic AVR 06/2017.   Arthritis    Ascending aorta dilatation (HCC)    40 mm by echo 09/2022   Bicuspid aortic valve    Bradycardia    secondary to BB   Broken clavicle    as child, can still dislocate at times   CAD (coronary artery disease), native coronary artery    a. s/p 2V CABG at time of AVR 06/2017.   Carotid bruit    carotid dopplers negative.  Bruit due to heart murmur from AS   Congenital nevus    Diastolic dysfunction    Dysplastic nevus 12/12/2008   Left mid back, 9cm lat. to spine. Moderate atypia, extends to one edge.    Dysplastic nevus 12/12/2008   Left mid to low back, 7cm  lat. to spine. Moderate atypia, close to margin.    Dysplastic nevus 09/24/2012   Right mid lateral buttocks. Moderate atypia, close to edge.    Dysplastic nevus 08/15/2020   Left mid back infrascapular - excised 09/25/20   Dysplastic nevus 10/02/2020   left post deltoid near tricep, mild atypia   Dysplastic nevus 10/02/2020   right ant lateral prox thigh, severe, Shave removal 11/19/20   Dysplastic nevus 04/11/2021   right mid back lateral near side, moderate atypia   Dysplastic nevus 04/11/2021   left upper back paraspinal, moderate atypia   Dysplastic nevus 04/14/2022   left upper back paraspinal, mod to severe   GERD (gastroesophageal reflux disease)    Headache    optical migraine   Hx of melanoma in situ 06/26/2009   Left  superior thigh. Arising in dysplastic nevus, close to margin. Excised 07/11/2009, margins free.   Hyperlipidemia    LDL goal < 70   LVH (left ventricular hypertrophy)    Melanoma of skin (HCC)    Obesity    PFO (patent foramen ovale)    small by echo 2014, but not demonstrated during intra-op note from CABG/AVR 06/2017.   Past Surgical History:  Procedure Laterality Date   AORTIC VALVE REPLACEMENT N/A 06/25/2017   Procedure: AORTIC VALVE REPLACEMENT (AVR) using Magna Ease Aortic Valve size 23mm;  Surgeon: Kerin Perna, MD;  Location: [facility] OR;  Service: Open Heart Surgery;  Laterality: N/A;   arthritic cyst removal  2017   from Lumbar 4-5, dr pool   CARDIAC CATHETERIZATION N/A 02/20/2016   Procedure: Right/Left Heart Cath and Coronary Angiography;  Surgeon: Lyn Records, MD;  Location: [facility] INVASIVE CV LAB;  Service: Cardiovascular;  Laterality: N/A;   CORONARY ARTERY BYPASS GRAFT N/A 06/25/2017   Procedure: CORONARY ARTERY BYPASS GRAFTING (CABG) x Two , using left internal mammary artery and right leg greater saphenous vein harvested endoscopically;  Surgeon: Kerin Perna, MD;  Location: [facility] OR;  Service: Open Heart Surgery;  Laterality: N/A;   CORONARY/GRAFT ANGIOGRAPHY N/A 06/02/2017   Procedure: CORONARY/GRAFT ANGIOGRAPHY;  Surgeon: Yvonne Kendall, MD;  Location: [facility] INVASIVE CV LAB;  Service: Cardiovascular;  Laterality: N/A;   EYE SURGERY     MELANOMA EXCISION  2011   left thigh   RIGHT HEART CATH N/A 06/02/2017   Procedure: RIGHT HEART CATH;  Surgeon: Yvonne Kendall, MD;  Location: [facility] INVASIVE CV LAB;  Service: Cardiovascular;  Laterality: N/A;   TEE WITHOUT CARDIOVERSION N/A 06/25/2017   Procedure: TRANSESOPHAGEAL ECHOCARDIOGRAM (TEE);  Surgeon: Donata Clay, Theron Arista, MD;  Location: [facility] OR;  Service: Open Heart Surgery;  Laterality: N/A;   TONSILLECTOMY       Current Meds  Medication Sig   amLODipine (NORVASC) 5 MG tablet Take 1 tablet (5 mg total) by mouth daily.   aspirin EC  81 MG tablet Take 81 mg by mouth daily.   atorvastatin (LIPITOR) 80 MG tablet Take 1 tablet (80 mg total) by mouth at bedtime.   cholecalciferol (VITAMIN D) 1000 units tablet Take 1,000 Units by mouth every evening.   Coenzyme Q10 (COQ-10) 200 MG CAPS Take 200 mg by mouth daily.     Allergies:   Patient has no known allergies.   Social History   Tobacco Use   Smoking status: Former    Packs/day: 1    Types: Cigarettes    Quit date: 04/07/1976    Years since quitting: 46.4   Smokeless tobacco: Former   Tobacco comments:  quit in 78, teenager  Vaping Use   Vaping Use: Never used  Substance Use Topics   Alcohol use: Yes    Alcohol/week: 1.0 standard drink of alcohol    Types: 1 Cans of beer per week    Comment: occasional   Drug use: No    Comment: quit marijuana in 1990     Family Hx: The patient's family history includes Arthritis in his father and sister; Diabetes in his mother; Heart attack in an other family member; Heart disease in his mother and paternal grandmother; Pancreatic cancer in his maternal aunt.  ROS:   Please see the history of present illness.     All other systems reviewed and are negative.   Labs/Other Tests and Data Reviewed:    Recent Labs: 02/18/2022: ALT 23; BUN 13; Creat 0.78; Hemoglobin 14.8; Platelets 103; Potassium 4.5; Sodium 137   Recent Lipid Panel Lab Results  Component Value Date/Time   CHOL 106 02/18/2022 08:55 AM   CHOL 115 02/22/2019 09:28 AM   CHOL 173 11/02/2015 12:00 AM   TRIG 66 02/18/2022 08:55 AM   TRIG 69 11/02/2015 12:00 AM   HDL 55 02/18/2022 08:55 AM   HDL 56 02/22/2019 09:28 AM   CHOLHDL 1.9 02/18/2022 08:55 AM   LDLCALC 36 02/18/2022 08:55 AM   LDLCALC 104 (A) 11/02/2015 12:00 AM    Wt Readings from Last 3 Encounters:  09/19/22 179 lb 3.2 oz (81.3 kg)  04/10/22 170 lb (77.1 kg)  04/10/22 171 lb 6.4 oz (77.7 kg)     Objective:    Vital Signs:  BP 132/78   Pulse 62   Ht 5' 9.5" (1.765 m)   Wt 179 lb 3.2  oz (81.3 kg)   SpO2 98%   BMI 26.08 kg/m    GEN: Well nourished, well developed in no acute distress HEENT: Normal NECK: No JVD; No carotid bruits LYMPHATICS: No lymphadenopathy CARDIAC:RRR, no murmurs, rubs, gallops RESPIRATORY:  Clear to auscultation without rales, wheezing or rhonchi  ABDOMEN: Soft, non-tender, non-distended MUSCULOSKELETAL:  No edema; No deformity  SKIN: Warm and dry NEUROLOGIC:  Alert and oriented x 3 PSYCHIATRIC:  Normal affect   EKG was done in office and demonstrates normal sinus rhythm with prior inferior and anterior infarct unchanged from prior EKG  ASSESSMENT & PLAN:     1.  OSA - The patient is tolerating PAP therapy well without any problems. The PAP download performed by his DME was personally reviewed and interpreted by me today and showed an AHI of 0.6 /hr on auto CPAP from 5 to 18 cm H2O  with 97 % compliance in using more than 4 hours nightly.  The patient has been using and benefiting from PAP use and will continue to benefit from therapy.    2.  Bicuspid AV with severe AS -s/p  AVR with a 23 mm Edwards magna ease AVR 06/2017 -2D echo 09/2020 showed a stable AVR with mean aortic valve gradient -2D echo 09/11/2022 showed stable AVR with mean aortic valve gradient 24 mmHg  -We had a long discussion about the slight increase in aortic valve gradient of his bioprosthesis.  It does appear he has some mild stenosis of the prosthesis and will continue to follow the gradient which has slowly increased from 13 mmHg at the time of implant up to 24 mm on last echo.  He has a repeat echo pending in 6 months -continue ASA -continue ongoing SBE prophylaxis for dental procedures  3.  ASCAD  -cath 2019 showed severe 2 vessel CAD  -s/p CABG x 2 utilizing LIMA to LAD, and SVG to distal PDA.   -He has had chronic chest pain ever since his bypass surgery -CRP and ESR were normal and no  pericardial effusion and no thickening of the pericardium -Lexiscan myoview  showed no ischemia 08/2019 -He denies any anginal symptoms since I saw him last -Continue drug management with aspirin 81 mg daily and atorvastatin 80 mg daily with as needed refills -He is walking 4 miles daily without any cardiac symptoms.  He is possibly going to need neurosurgery on his lumbar spine. -Preoperative cardiovascular examination His perioperative risk of a major cardiac event is 0.9% according to the Revised Cardiac Risk Index (RCRI).  Therefore, the patient is at low risk for perioperative complications.   The patient's  functional capacity is good at over 8 METs according to the Duke Activity Status Index (DASI). Recommendations: According to ACC/AHA guidelines, no further cardiovascular testing needed.  The patient may proceed to surgery at acceptable risk.    4.  Dilated ascending aorta  -repeat echo 09/2020 showed a ascending aortic measurement of 47 mm -43 mm by chest CTA 09/13/2020  -41 mm by chest CTA 6/23 -40 mm by 2D echo 09/2022 -BP remains controlled  5.  Hyperlipidemia -LDL goal < 70.  -I have personally reviewed and interpreted outside labs performed by patient's PCP which showed LDL 36 and HDL 55 on 02/18/2022 ALT normal at 23 -Continue prescription drug management atorvastatin 80 mg daily with as needed refills  6.  HTN -BP is adequately controlled on exam today -continue prescription drug  management with Amlodipine 5mg  daily with PRN refills  7.  Syncope -he had an ILR placed almost 2 years ago and no afib -he is interested in getting the ILR out so will refer back to Dr. Lalla Brothers   Medication Adjustments/Labs and Tests Ordered: Current medicines are reviewed at length with the patient today.  Concerns regarding medicines are outlined above.  Tests Ordered: Orders Placed This Encounter  Procedures   EKG 12-Lead   Medication Changes: No orders of the defined types were placed in this encounter.   Disposition:  Follow up in 1  year(s)  Signed, Armanda Magic, MD  09/19/2022 9:28 AM    [facility]

## 2022-09-19 NOTE — Patient Instructions (Addendum)
Medication Instructions:  Your physician recommends that you continue on your current medications as directed. Please refer to the Current Medication list given to you today.  *If you need a refill on your cardiac medications before your next appointment, please call your pharmacy*  Lab Work: None ordered today.  Testing/Procedures: None ordered today.  Follow-Up: At [facility], you and your health needs are our priority.  As part of our continuing mission to provide you with exceptional heart care, we have created designated Provider Care Teams.  These Care Teams include your primary Cardiologist (physician) and Advanced Practice Providers (APPs -  Physician Assistants and Nurse Practitioners) who all work together to provide you with the care you need, when you need it.  Your next appointment:   1 year(s) Next available with Dr. Lalla Brothers in [facility] for loop explant  The format for your next appointment:   In Person  Provider:   Armanda Magic, MD {

## 2022-09-23 NOTE — Progress Notes (Signed)
Carelink Summary Report / Loop Recorder 

## 2022-09-29 ENCOUNTER — Ambulatory Visit (INDEPENDENT_AMBULATORY_CARE_PROVIDER_SITE_OTHER): Payer: Medicare PPO

## 2022-09-29 DIAGNOSIS — R55 Syncope and collapse: Secondary | ICD-10-CM

## 2022-09-29 LAB — CUP PACEART REMOTE DEVICE CHECK
Date Time Interrogation Session: 20240623231234
Implantable Pulse Generator Implant Date: 20220831

## 2022-10-13 DIAGNOSIS — G4733 Obstructive sleep apnea (adult) (pediatric): Secondary | ICD-10-CM | POA: Diagnosis not present

## 2022-10-14 ENCOUNTER — Other Ambulatory Visit: Payer: Self-pay | Admitting: Neurosurgery

## 2022-10-14 DIAGNOSIS — M7138 Other bursal cyst, other site: Secondary | ICD-10-CM

## 2022-10-16 ENCOUNTER — Ambulatory Visit: Payer: Medicare PPO | Attending: Cardiology | Admitting: Cardiology

## 2022-10-16 ENCOUNTER — Encounter: Payer: Self-pay | Admitting: Cardiology

## 2022-10-16 VITALS — BP 132/74 | HR 69 | Ht 69.5 in | Wt 178.0 lb

## 2022-10-16 DIAGNOSIS — Z953 Presence of xenogenic heart valve: Secondary | ICD-10-CM | POA: Diagnosis not present

## 2022-10-16 DIAGNOSIS — R55 Syncope and collapse: Secondary | ICD-10-CM | POA: Diagnosis not present

## 2022-10-16 DIAGNOSIS — I350 Nonrheumatic aortic (valve) stenosis: Secondary | ICD-10-CM | POA: Diagnosis not present

## 2022-10-16 NOTE — Patient Instructions (Signed)
Medication Instructions:  Your physician recommends that you continue on your current medications as directed. Please refer to the Current Medication list given to you today.  Labwork: None ordered.  Testing/Procedures: None ordered.  Follow-Up:  As needed with Dr. Lalla Brothers    Implantable Loop Recorder Removal, Care After This sheet gives you information about how to care for yourself after your procedure. Your health care provider may also give you more specific instructions. If you have problems or questions, contact your health care provider. What can I expect after the procedure? After the procedure, it is common to have: Soreness or discomfort near the incision. Some swelling or bruising near the incision.  Follow these instructions at home: Incision care  Monitor your cardiac device site for redness, swelling, and drainage. Call the device clinic at [phone] if you experience these symptoms or fever/chills.  Keep the large square bandage on your site for 24 hours and then you may remove it yourself. Keep the steri-strips underneath in place.   You may shower after 72 hours / 3 days from your procedure with the steri-strips in place. They will usually fall off on their own, or may be removed after 10 days. Pat dry.   Avoid lotions, ointments, or perfumes over your incision until it is well-healed.  Please do not submerge in water until your site is completely healed.   If your wound site starts to bleed apply pressure.       If you have any questions/concerns please call the device clinic at [phone].  Activity  Return to your normal activities.  Contact a health care provider if: You have redness, swelling, or pain around your incision. You have a fever.

## 2022-10-16 NOTE — Progress Notes (Signed)
Electrophysiology Office Follow up Visit Note:    Date:  10/16/2022   ID:  Todd Frank, DOB 09-22-1955, MRN [id number]  PCP:  Smitty Cords, DO  [facility] Cardiologist:  Armanda Magic, MD  [facility] Electrophysiologist:  Lanier Prude, MD    Interval History:    Todd Frank is a 67 y.o. male who presents for a follow up visit.   I last saw the patient December 05, 2020 for syncope.  A loop recorder was implanted.  He has a history of aortic valve replacement with a bioprosthetic aortic valve. Loop recorder monitoring has revealed no significant arrhythmias to account for his history of syncope.  He presents for loop recorder removal today  Today he tells me that he has been doing well.  No further syncopal episodes.  Intermittently will feel lightheaded but there is been no corresponding arrhythmias on his loop recorder.  He is interested in having his loop recorder removed which I think is very reasonable.     Past medical, surgical, social and family history were reviewed.  ROS:   Please see the history of present illness.    All other systems reviewed and are negative.  EKGs/Labs/Other Studies Reviewed:    The following studies were reviewed today:         Physical Exam:    VS:  BP 132/74   Pulse 69   Ht 5' 9.5" (1.765 m)   Wt 178 lb (80.7 kg)   SpO2 98%   BMI 25.91 kg/m     Wt Readings from Last 3 Encounters:  10/16/22 178 lb (80.7 kg)  09/19/22 179 lb 3.2 oz (81.3 kg)  04/10/22 170 lb (77.1 kg)     GEN:  Well nourished, well developed in no acute distress CARDIAC: RRR, no murmurs, rubs, gallops RESPIRATORY:  Clear to auscultation without rales, wheezing or rhonchi       ASSESSMENT:    1. Syncope, unspecified syncope type   2. Severe aortic stenosis   3. S/P aortic valve replacement with bioprosthetic valve    PLAN:    In order of problems listed above:  #Syncope Does not appear to be an arrhythmic cause of his  history of syncope.  Symptom triggers on his loop recorder corresponded to sinus rhythm.  I have encouraged him to stay hydrated.  He wishes to have his loop recorder removed.  I discussed the loop recorder removal procedure in detail including the risks and he wishes to proceed.  #History of aortic stenosis post aortic valve replacement Prosthesis appears to be functioning appropriately.       Signed, Steffanie Dunn, MD, [facility], [facility] 10/16/2022 3:50 PM    Electrophysiology [facility]  -------------------------  SURGEON:  Steffanie Dunn, MD    PREPROCEDURE DIAGNOSIS:  syncope    POSTPROCEDURE DIAGNOSIS:  syncope     PROCEDURES:   1. Implantable loop recorder explantation    INTRODUCTION:  Todd Frank is a 67 y.o. male with a history of syncope/near syncope who presents today for implantable loop explantation.      DESCRIPTION OF PROCEDURE:  Informed written consent was obtained.  A preprocedural timeout was performed with the assistance an RN English as a second language teacher). The patient required no sedation for the procedure today.   The patients left chest was therefore prepped and draped in the usual sterile fashion.  The skin overlying the ILR monitor was infiltrated with lidocaine for local analgesia.  A 0.5-cm incision was made  over the site.  The previously implanted ILR was exposed and removed using a combination of sharp and blunt dissection.  Steri- Strips and a sterile dressing were then applied. EBL<32ml.  There were no early apparent complications.     CONCLUSIONS:   1. Successful explantation of an implantable loop recorder   2. No early apparent complications.        Steffanie Dunn, MD 10/16/2022 3:50 PM

## 2022-10-20 NOTE — Progress Notes (Signed)
 [facility]link Summary Report / Loop Recorder 

## 2022-10-23 ENCOUNTER — Ambulatory Visit
Admission: RE | Admit: 2022-10-23 | Discharge: 2022-10-23 | Disposition: A | Discharge status: Home / Self Care | Payer: Medicare PPO | Source: Ambulatory Visit | Attending: Neurosurgery | Admitting: Neurosurgery

## 2022-10-23 DIAGNOSIS — M545 Low back pain, unspecified: Secondary | ICD-10-CM | POA: Diagnosis not present

## 2022-10-23 DIAGNOSIS — M5136 Other intervertebral disc degeneration, lumbar region: Secondary | ICD-10-CM | POA: Diagnosis not present

## 2022-10-23 DIAGNOSIS — M48061 Spinal stenosis, lumbar region without neurogenic claudication: Secondary | ICD-10-CM | POA: Diagnosis not present

## 2022-10-23 DIAGNOSIS — M5137 Other intervertebral disc degeneration, lumbosacral region: Secondary | ICD-10-CM | POA: Diagnosis not present

## 2022-10-23 DIAGNOSIS — M7138 Other bursal cyst, other site: Secondary | ICD-10-CM | POA: Insufficient documentation

## 2022-11-05 DIAGNOSIS — M7138 Other bursal cyst, other site: Secondary | ICD-10-CM | POA: Diagnosis not present

## 2022-11-06 ENCOUNTER — Other Ambulatory Visit: Payer: Self-pay | Admitting: Cardiology

## 2022-11-07 ENCOUNTER — Encounter: Payer: Self-pay | Admitting: Cardiology

## 2022-11-07 MED ORDER — ATORVASTATIN CALCIUM 80 MG PO TABS: 80.0000 mg | ORAL_TABLET | Freq: Every day | ORAL | 3 refills | Status: DC

## 2022-12-16 ENCOUNTER — Encounter: Payer: Self-pay | Admitting: Family Medicine

## 2023-01-08 ENCOUNTER — Encounter: Payer: Self-pay | Admitting: Family Medicine

## 2023-01-16 ENCOUNTER — Encounter: Payer: Self-pay | Admitting: Family Medicine

## 2023-01-28 DIAGNOSIS — G4733 Obstructive sleep apnea (adult) (pediatric): Secondary | ICD-10-CM | POA: Diagnosis not present

## 2023-02-04 ENCOUNTER — Ambulatory Visit: Payer: Medicare PPO | Admitting: Family Medicine

## 2023-02-04 VITALS — BP 140/90 | HR 86 | Ht 69.5 in | Wt 179.0 lb

## 2023-02-04 DIAGNOSIS — J3089 Other allergic rhinitis: Secondary | ICD-10-CM | POA: Diagnosis not present

## 2023-02-04 DIAGNOSIS — R42 Dizziness and giddiness: Secondary | ICD-10-CM

## 2023-02-04 DIAGNOSIS — R110 Nausea: Secondary | ICD-10-CM | POA: Diagnosis not present

## 2023-02-04 DIAGNOSIS — R55 Syncope and collapse: Secondary | ICD-10-CM | POA: Diagnosis not present

## 2023-02-04 MED ORDER — AZELASTINE HCL 0.1 % NA SOLN: 2.0000 | Freq: Two times a day (BID) | NASAL | 5 refills | Status: DC

## 2023-02-04 MED ORDER — MECLIZINE HCL 25 MG PO TABS
25.0000 mg | ORAL_TABLET | Freq: Three times a day (TID) | ORAL | 0 refills | Status: DC | PRN
Start: 2023-02-04 — End: 2023-12-10

## 2023-02-04 MED ORDER — ONDANSETRON 4 MG PO TBDP: 4.0000 mg | ORAL_TABLET | Freq: Three times a day (TID) | ORAL | 2 refills | Status: DC | PRN

## 2023-02-04 NOTE — Progress Notes (Signed)
Subjective:    Patient ID: Todd Frank, male    DOB: 09/07/1955, 67 y.o.   MRN: [id number]  Todd Frank is a 67 y.o. male presenting on 02/04/2023 for Dizziness and Nausea (/)   HPI  Discussed the use of AI scribe software for clinical note transcription with the patient, who gave verbal consent to proceed.     The most recent episode occurred while performing chores in the garage, during which the patient experienced progressive dizziness and lightheadedness, followed by profuse sweating and vomiting. These symptoms lasted for approximately an hour and a half. The patient's blood pressure was measured at 125/64, with an oxygen saturation of 94% and a heart rate in the low 50s. Notably, the patient did not experience any chest pain or shortness of breath during these episodes. His wife who is an Charity fundraiser thought it was a vagal episode. Cardia EKG Device showed Normal sinus rhythm. He did feel better later and went with normal activity  The original first significant episode occurred in June 2022, during which the patient experienced severe lightheadedness and a brief loss of consciousness. This episode prompted EMS eval and hospital admission, during which a multitude of tests were conducted, but no definitive diagnosis was made. A loop implant was inserted for monitoring, but it did not reveal any significant findings.  Subsequent episodes occurred in October 2023 with a milder episode that resolved within 15 minutes. The patient also reported daily instances of lightheadedness, but these were not associated with sweating or vomiting. The patient has been managing these symptoms by getting up slowly and maintaining hydration.  The patient also reported a constant feeling of pressure around the eyes, forehead, and ears, which he manages with azelastine, an antihistamine. The patient's family history includes a father with frequent episodes of vasovagal syncope in his 65s.     Regarding current symptoms or past symptoms. No official vertigo, he does not endorse room spinning or sense of movement.  Even before these episodes, we have documented. He has chronic history of lightheaded vs dizzy, since 2019 initial work up with Neurology and Cardiology.  Symptoms never occur when he is working out or active. Not during exertion.      03/17/2022    2:13 PM 02/24/2022   11:20 AM 02/18/2021    9:22 AM  Depression screen PHQ 2/9  Decreased Interest 0 0 0  Down, Depressed, Hopeless 0 0 0  PHQ - 2 Score 0 0 0  Altered sleeping 0  0  Tired, decreased energy 0  0  Change in appetite 0  0  Feeling bad or failure about yourself  0  0  Trouble concentrating 0  0  Moving slowly or fidgety/restless 0  0  Suicidal thoughts 0  0  PHQ-9 Score 0  0  Difficult doing work/chores Not difficult at all  Not difficult at all    Social History   Tobacco Use   Smoking status: Former    Current packs/day: 0.00    Types: Cigarettes    Quit date: 04/07/1976    Years since quitting: 46.8   Smokeless tobacco: Former   Tobacco comments:    quit in 78, teenager  Vaping Use   Vaping status: Never Used  Substance Use Topics   Alcohol use: Yes    Alcohol/week: 1.0 standard drink of alcohol    Types: 1 Cans of beer per week    Comment: occasional   Drug use: No  Comment: quit marijuana in 1990    Review of Systems Per HPI unless specifically indicated above     Objective:    BP (!) 140/90 (BP Location: Left Arm, Cuff Size: Normal)   Pulse 86   Ht 5' 9.5" (1.765 m)   Wt 179 lb (81.2 kg)   SpO2 97%   BMI 26.05 kg/m   Wt Readings from Last 3 Encounters:  02/04/23 179 lb (81.2 kg)  10/16/22 178 lb (80.7 kg)  09/19/22 179 lb 3.2 oz (81.3 kg)    Physical Exam Vitals and nursing note reviewed.  Constitutional:      General: He is not in acute distress.    Appearance: Normal appearance. He is well-developed. He is not diaphoretic.     Comments: Well-appearing,  comfortable, cooperative  HENT:     Head: Normocephalic and atraumatic.     Comments: Mild L>R effusion clear no erythema    Right Ear: Tympanic membrane, ear canal and external ear normal. There is no impacted cerumen.     Left Ear: Tympanic membrane, ear canal and external ear normal. There is no impacted cerumen.  Eyes:     General:        Right eye: No discharge.        Left eye: No discharge.     Conjunctiva/sclera: Conjunctivae normal.  Cardiovascular:     Rate and Rhythm: Normal rate.  Pulmonary:     Effort: Pulmonary effort is normal.  Skin:    General: Skin is warm and dry.     Findings: No erythema or rash.  Neurological:     Mental Status: He is alert and oriented to person, place, and time.  Psychiatric:        Mood and Affect: Mood normal.        Behavior: Behavior normal.        Thought Content: Thought content normal.     Comments: Well groomed, good eye contact, normal speech and thoughts     I have personally reviewed the radiology report from 09/13/20 on CT Head.  CLINICAL DATA:  Syncope.  Diaphoresis   EXAM: CT HEAD WITHOUT CONTRAST   TECHNIQUE: Contiguous axial images were obtained from the base of the skull through the vertex without intravenous contrast.   COMPARISON:  None.   FINDINGS: Brain: No acute intracranial hemorrhage. No focal mass lesion. No CT evidence of acute infarction. No midline shift or mass effect. No hydrocephalus. Basilar cisterns are patent.   Vascular: No hyperdense vessel or unexpected calcification.   Skull: Normal. Negative for fracture or focal lesion.   Sinuses/Orbits: Paranasal sinuses and mastoid air cells are clear. Orbits are clear.   Other: None.   IMPRESSION: No acute intracranial findings.     Electronically Signed   By: Genevive Bi M.D.   On: 09/13/2020 20:37  Results for orders placed or performed in visit on 09/29/22  CUP PACEART REMOTE DEVICE CHECK  Result Value Ref Range   Date Time  Interrogation Session [phone]    Pulse Generator Manufacturer MERM    Pulse Gen Model [id number] Donato Heinz    Pulse Gen Serial Number [id number]    Clinic Name [facility]    Implantable Pulse Generator Type ICM/ILR    Implantable Pulse Generator Implant Date [id number]       Assessment & Plan:   Problem List Items Addressed This Visit     Allergic rhinitis   Relevant Medications   azelastine (ASTELIN) 0.1 % nasal spray  Other Visit Diagnoses     Vaso vagal episode    -  Primary   Dizziness       Relevant Medications   meclizine (ANTIVERT) 25 MG tablet   Nausea       Relevant Medications   ondansetron (ZOFRAN-ODT) 4 MG disintegrating tablet         Vasovagal Syncope Recurrent episodes of lightheadedness, dizziness, sweating, and nausea. No chest pain or shortness of breath. No evidence of orthostatic hypotension or vertigo. No significant findings on previous loop implant or head CT can. Prior detailed work up over the years w/ Neurology + Cardiology Latest episode classic for vagal episode. Prior near syncope + syncopal events very similar x 3-4 total in past 1 year  Reassurance overall. Discussed that unlikely to be cardiac or neurological, or inner ear based on symptoms and patterns.  -Continue to maintain hydration and avoid sudden position changes. - Trial Zofran ODT AS NEEDED if new acute episode w nausea -Trial of Meclizine as needed for symptoms.  Sinus Pressure Constant feeling of pressure around eyes, forehead, and ears. No nasal congestion. Previous head scan showed clear sinuses. -Continue Azelastine nasal spray. -Consider further evaluation if symptoms persist.         Meds ordered this encounter  Medications   meclizine (ANTIVERT) 25 MG tablet    Sig: Take 1 tablet (25 mg total) by mouth 3 (three) times daily as needed for dizziness.    Dispense:  30 tablet    Refill:  0   ondansetron (ZOFRAN-ODT) 4 MG disintegrating tablet    Sig: Take 1 tablet  (4 mg total) by mouth every 8 (eight) hours as needed for nausea or vomiting.    Dispense:  30 tablet    Refill:  2   azelastine (ASTELIN) 0.1 % nasal spray    Sig: Place 2 sprays into both nostrils 2 (two) times daily. Use in each nostril as directed    Dispense:  30 mL    Refill:  5      Follow up plan: Return if symptoms worsen or fail to improve.   Saralyn Pilar, DO [facility] 02/04/2023, 10:44 AM

## 2023-02-04 NOTE — Patient Instructions (Addendum)
Thank you for coming to the office today.  Likely Vasovagal response  Not vertigo Does not seem cardiac or neurological  Try Zofran as needed for nausea if episode occurs  Caution with standing and position change. Keep improving hydration.  Meclizine ordered to trial but may not help if not vertigo   Please schedule a Follow-up Appointment to: Return if symptoms worsen or fail to improve.  If you have any other questions or concerns, please feel free to call the office or send a message through MyChart. You may also schedule an earlier appointment if necessary.  Additionally, you may be receiving a survey about your experience at our office within a few days to 1 week by e-mail or mail. We value your feedback.  Saralyn Pilar, DO [facility], [place]

## 2023-03-13 ENCOUNTER — Ambulatory Visit (HOSPITAL_COMMUNITY): Payer: Medicare PPO | Attending: Cardiology

## 2023-03-13 DIAGNOSIS — Z953 Presence of xenogenic heart valve: Secondary | ICD-10-CM | POA: Diagnosis not present

## 2023-03-13 DIAGNOSIS — I350 Nonrheumatic aortic (valve) stenosis: Secondary | ICD-10-CM | POA: Diagnosis not present

## 2023-03-13 LAB — ECHOCARDIOGRAM COMPLETE
AR max vel: 1.35 cm2
AV Area VTI: 1.49 cm2
AV Area mean vel: 1.32 cm2
AV Mean grad: 23.5 mm[Hg]
AV Peak grad: 41.5 mm[Hg]
Ao pk vel: 3.22 m/s
Area-P 1/2: 3.03 cm2
S' Lateral: 3.5 cm

## 2023-03-20 ENCOUNTER — Encounter: Payer: Self-pay | Admitting: Cardiology

## 2023-03-27 ENCOUNTER — Telehealth: Payer: Self-pay

## 2023-03-27 DIAGNOSIS — I350 Nonrheumatic aortic (valve) stenosis: Secondary | ICD-10-CM

## 2023-03-27 DIAGNOSIS — Z01812 Encounter for preprocedural laboratory examination: Secondary | ICD-10-CM

## 2023-03-27 DIAGNOSIS — I712 Thoracic aortic aneurysm, without rupture, unspecified: Secondary | ICD-10-CM

## 2023-03-27 NOTE — Telephone Encounter (Signed)
Please see Telephone encounter from today.

## 2023-03-27 NOTE — Telephone Encounter (Signed)
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  DVI and gradients are stable  - repeat echo in 6 months. - Dr. Mayford Knife discussed more aggressive evaluation (TEE or CT to eval pannus) at prior evaluation ----- Message ----- From: Interface, [employer] Sent: 03/13/2023   3:42 PM EST To: Quintella Reichert, MD

## 2023-03-27 NOTE — Telephone Encounter (Signed)
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 Dr. Izora Ribas recommending repeat echo in 6 months. Patient verbalizes understanding and agrees to plan. Patient asks if his aortic aneurysm is stable, patient responses forwarded to Dr. Mayford Knife.

## 2023-04-03 ENCOUNTER — Other Ambulatory Visit: Payer: Self-pay

## 2023-04-03 DIAGNOSIS — Z01812 Encounter for preprocedural laboratory examination: Secondary | ICD-10-CM

## 2023-04-03 NOTE — Telephone Encounter (Signed)
Call to patient to advise that Dr. Mayford Knife recommends a Gated chest CTA. Patient agrees to BMET prior to test. Advised patient of test location and sent instructions over MyChart.

## 2023-04-03 NOTE — Addendum Note (Signed)
Addended by: Luellen Pucker on: 04/03/2023 03:02 PM   Modules accepted: Orders

## 2023-04-09 DIAGNOSIS — Z01812 Encounter for preprocedural laboratory examination: Secondary | ICD-10-CM | POA: Diagnosis not present

## 2023-04-09 LAB — BASIC METABOLIC PANEL
BUN/Creatinine Ratio: 17 (ref 10–24)
BUN: 15 mg/dL (ref 8–27)
CO2: 22 mmol/L (ref 20–29)
Calcium: 9.7 mg/dL (ref 8.6–10.2)
Chloride: 100 mmol/L (ref 96–106)
Creatinine, Ser: 0.9 mg/dL (ref 0.76–1.27)
Glucose: 85 mg/dL (ref 70–99)
Potassium: 4.6 mmol/L (ref 3.5–5.2)
Sodium: 137 mmol/L (ref 134–144)
eGFR: 93 mL/min/{1.73_m2} (ref >59)

## 2023-04-23 ENCOUNTER — Ambulatory Visit: Payer: Medicare PPO | Admitting: Dermatology

## 2023-04-23 ENCOUNTER — Ambulatory Visit (HOSPITAL_COMMUNITY)
Admission: RE | Admit: 2023-04-23 | Discharge: 2023-04-23 | Disposition: A | Discharge status: Home / Self Care | Payer: Medicare PPO | Source: Ambulatory Visit | Attending: Cardiology | Admitting: Cardiology

## 2023-04-23 DIAGNOSIS — I350 Nonrheumatic aortic (valve) stenosis: Secondary | ICD-10-CM | POA: Diagnosis not present

## 2023-04-23 DIAGNOSIS — I712 Thoracic aortic aneurysm, without rupture, unspecified: Secondary | ICD-10-CM | POA: Diagnosis not present

## 2023-04-23 DIAGNOSIS — I2510 Atherosclerotic heart disease of native coronary artery without angina pectoris: Secondary | ICD-10-CM | POA: Diagnosis not present

## 2023-04-23 MED ORDER — IOHEXOL 350 MG/ML SOLN
75.0000 mL | Freq: Once | INTRAVENOUS | Status: AC | PRN
  Administered 2023-04-23: 75 mL via INTRAVENOUS

## 2023-04-26 ENCOUNTER — Encounter: Payer: Self-pay | Admitting: Cardiology

## 2023-04-27 ENCOUNTER — Ambulatory Visit (HOSPITAL_COMMUNITY): Payer: Medicare PPO

## 2023-04-30 DIAGNOSIS — G4733 Obstructive sleep apnea (adult) (pediatric): Secondary | ICD-10-CM | POA: Diagnosis not present

## 2023-05-01 ENCOUNTER — Telehealth: Payer: Self-pay

## 2023-05-01 ENCOUNTER — Encounter: Payer: Self-pay | Admitting: Family Medicine

## 2023-05-01 DIAGNOSIS — I77810 Thoracic aortic ectasia: Secondary | ICD-10-CM

## 2023-05-01 DIAGNOSIS — I7121 Aneurysm of the ascending aorta, without rupture: Secondary | ICD-10-CM

## 2023-05-01 NOTE — Telephone Encounter (Signed)
-----   Message from Armanda Magic sent at 04/26/2023  7:24 PM EST ----- Chest CTA showed stable dilatation of the ascending aorta at 4.2 cm (4.3 cm in 2022).  Check 2D echo 04/2024 for dilated aorta

## 2023-05-01 NOTE — Telephone Encounter (Signed)
Call to patient to advise that Chest CTA showed stable dilatation of the ascending aorta at 4.2 cm (4.3 cm in 2022).  Patient agrees to repeat 2D echo 04/2024 for dilated aorta.

## 2023-05-05 ENCOUNTER — Other Ambulatory Visit: Payer: Self-pay | Admitting: Family Medicine

## 2023-05-05 DIAGNOSIS — I10 Essential (primary) hypertension: Secondary | ICD-10-CM

## 2023-05-05 DIAGNOSIS — G4733 Obstructive sleep apnea (adult) (pediatric): Secondary | ICD-10-CM

## 2023-05-05 DIAGNOSIS — R7309 Other abnormal glucose: Secondary | ICD-10-CM

## 2023-05-05 DIAGNOSIS — E7800 Pure hypercholesterolemia, unspecified: Secondary | ICD-10-CM

## 2023-05-05 DIAGNOSIS — Z125 Encounter for screening for malignant neoplasm of prostate: Secondary | ICD-10-CM

## 2023-05-05 DIAGNOSIS — Z0000 Encounter for general adult medical examination without abnormal findings: Secondary | ICD-10-CM

## 2023-05-05 DIAGNOSIS — D696 Thrombocytopenia, unspecified: Secondary | ICD-10-CM

## 2023-05-07 ENCOUNTER — Other Ambulatory Visit: Payer: Medicare PPO

## 2023-05-07 DIAGNOSIS — D696 Thrombocytopenia, unspecified: Secondary | ICD-10-CM | POA: Diagnosis not present

## 2023-05-07 DIAGNOSIS — Z125 Encounter for screening for malignant neoplasm of prostate: Secondary | ICD-10-CM | POA: Diagnosis not present

## 2023-05-07 DIAGNOSIS — E7800 Pure hypercholesterolemia, unspecified: Secondary | ICD-10-CM

## 2023-05-07 DIAGNOSIS — Z0000 Encounter for general adult medical examination without abnormal findings: Secondary | ICD-10-CM | POA: Diagnosis not present

## 2023-05-07 DIAGNOSIS — I10 Essential (primary) hypertension: Secondary | ICD-10-CM | POA: Diagnosis not present

## 2023-05-07 DIAGNOSIS — R7309 Other abnormal glucose: Secondary | ICD-10-CM

## 2023-05-11 ENCOUNTER — Ambulatory Visit (INDEPENDENT_AMBULATORY_CARE_PROVIDER_SITE_OTHER): Payer: Medicare PPO | Admitting: Family Medicine

## 2023-05-11 ENCOUNTER — Other Ambulatory Visit: Payer: Self-pay | Admitting: Family Medicine

## 2023-05-11 VITALS — BP 122/80 | HR 69 | Ht 69.5 in | Wt 187.0 lb

## 2023-05-11 DIAGNOSIS — Z0000 Encounter for general adult medical examination without abnormal findings: Secondary | ICD-10-CM

## 2023-05-11 DIAGNOSIS — I10 Essential (primary) hypertension: Secondary | ICD-10-CM | POA: Diagnosis not present

## 2023-05-11 DIAGNOSIS — D696 Thrombocytopenia, unspecified: Secondary | ICD-10-CM

## 2023-05-11 DIAGNOSIS — E7800 Pure hypercholesterolemia, unspecified: Secondary | ICD-10-CM

## 2023-05-11 DIAGNOSIS — I712 Thoracic aortic aneurysm, without rupture, unspecified: Secondary | ICD-10-CM

## 2023-05-11 DIAGNOSIS — Z1211 Encounter for screening for malignant neoplasm of colon: Secondary | ICD-10-CM

## 2023-05-11 DIAGNOSIS — Z125 Encounter for screening for malignant neoplasm of prostate: Secondary | ICD-10-CM

## 2023-05-11 DIAGNOSIS — R351 Nocturia: Secondary | ICD-10-CM

## 2023-05-11 DIAGNOSIS — R7309 Other abnormal glucose: Secondary | ICD-10-CM

## 2023-05-11 DIAGNOSIS — G4733 Obstructive sleep apnea (adult) (pediatric): Secondary | ICD-10-CM

## 2023-05-11 LAB — COMPLETE METABOLIC PANEL WITH GFR
AG Ratio: 2.6 (calc) — ABNORMAL HIGH (ref 1.0–2.5)
ALT: 22 U/L (ref 9–46)
AST: 25 U/L (ref 10–35)
Albumin: 4.7 g/dL (ref 3.6–5.1)
Alkaline phosphatase (APISO): 94 U/L (ref 35–144)
BUN: 15 mg/dL (ref 7–25)
CO2: 27 mmol/L (ref 20–32)
Calcium: 9.6 mg/dL (ref 8.6–10.3)
Chloride: 105 mmol/L (ref 98–110)
Creat: 0.84 mg/dL (ref 0.70–1.35)
Globulin: 1.8 g/dL — ABNORMAL LOW (ref 1.9–3.7)
Glucose, Bld: 91 mg/dL (ref 65–99)
Potassium: 4.6 mmol/L (ref 3.5–5.3)
Sodium: 139 mmol/L (ref 135–146)
Total Bilirubin: 1.1 mg/dL (ref 0.2–1.2)
Total Protein: 6.5 g/dL (ref 6.1–8.1)
eGFR: 95 mL/min/{1.73_m2} (ref >=60)

## 2023-05-11 LAB — CBC WITH DIFFERENTIAL/PLATELET
Absolute Lymphocytes: 958 {cells}/uL (ref 850–3900)
Absolute Monocytes: 337 {cells}/uL (ref 200–950)
Basophils Absolute: 59 {cells}/uL (ref 0–200)
Basophils Relative: 1.6 %
Eosinophils Absolute: 207 {cells}/uL (ref 15–500)
Eosinophils Relative: 5.6 %
HCT: 46.3 % (ref 38.5–50.0)
Hemoglobin: 15.6 g/dL (ref 13.2–17.1)
MCH: 31.1 pg (ref 27.0–33.0)
MCHC: 33.7 g/dL (ref 32.0–36.0)
MCV: 92.4 fL (ref 80.0–100.0)
MPV: 12.3 fL (ref 7.5–12.5)
Monocytes Relative: 9.1 %
Neutro Abs: 2139 {cells}/uL (ref 1500–7800)
Neutrophils Relative %: 57.8 %
Platelets: 124 10*3/uL — ABNORMAL LOW (ref 140–400)
RBC: 5.01 10*6/uL (ref 4.20–5.80)
RDW: 12.6 % (ref 11.0–15.0)
Total Lymphocyte: 25.9 %
WBC: 3.7 10*3/uL — ABNORMAL LOW (ref 3.8–10.8)

## 2023-05-11 LAB — HEMOGLOBIN A1C
Hgb A1c MFr Bld: 5.3 %{Hb} (ref <5.7)
Mean Plasma Glucose: 105 mg/dL
eAG (mmol/L): 5.8 mmol/L

## 2023-05-11 LAB — TEST AUTHORIZATION

## 2023-05-11 LAB — LIPID PANEL
Cholesterol: 105 mg/dL (ref <200)
HDL: 54 mg/dL (ref >=40)
LDL Cholesterol (Calc): 36 mg/dL
Non-HDL Cholesterol (Calc): 51 mg/dL (ref <130)
Total CHOL/HDL Ratio: 1.9 (calc) (ref <5.0)
Triglycerides: 74 mg/dL (ref <150)

## 2023-05-11 LAB — PSA: PSA: 0.77 ng/mL (ref <=4.00)

## 2023-05-11 LAB — TSH: TSH: 1.3 m[IU]/L (ref 0.40–4.50)

## 2023-05-11 NOTE — Patient Instructions (Addendum)
Thank you for coming to the office today.  Contact us for refills when ready  Questionable for side effect of Amlodipine on the constipation.  For Constipation (less frequent bowel movement that can be hard dry or involve straining).  Recommend trying OTC Miralax 17g = 1 capful in large glass water once daily for now, try several days to see if working, goal is soft stool or BM 1-2 times daily, if too loose then reduce dose or try every other day. If not effective may need to increase it to 2 doses at once in AM or may do 1 in morning and 1 in afternoon/evening  - This medicine is very safe and can be used often without any problem and will not make you dehydrated. It is good for use on AS NEEDED BASIS or even MAINTENANCE therapy for longer term for several days to weeks at a time to help regulate bowel movements  Other more natural remedies or preventative treatment: - Increase hydration with water - Increase fiber in diet (high fiber foods = vegetables, leafy greens, oats/grains) - May take OTC Fiber supplement (metamucil powder or pill/gummy) - May try OTC Probiotic   DUE for FASTING BLOOD WORK (no food or drink after midnight before the lab appointment, only water or coffee without cream/sugar on the morning of)  SCHEDULE "Lab Only" visit in the morning at the clinic for lab draw in 1 YEAR  - Make sure Lab Only appointment is at about 1 week before your next appointment, so that results will be available  For Lab Results, once available within 2-3 days of blood draw, you can can log in to MyChart online to view your results and a brief explanation. Also, we can discuss results at next follow-up visit.   Please schedule a Follow-up Appointment to: Return for 1 year fasting lab > 1 week later Annual Physical.  If you have any other questions or concerns, please feel free to call the office or send a message through MyChart. You may also schedule an earlier appointment if  necessary.  Additionally, you may be receiving a survey about your experience at our office within a few days to 1 week by e-mail or mail. We value your feedback.  Saralyn Pilar, DO [facility], [place]

## 2023-05-11 NOTE — Progress Notes (Signed)
Subjective:    Patient ID: Todd Frank, male    DOB: 09-28-1955, 68 y.o.   MRN: [id number]  Todd Frank is a 68 y.o. male presenting on 05/11/2023 for Annual Exam   HPI  Discussed the use of AI scribe software for clinical note transcription with the patient, who gave verbal consent to proceed.  History of Present Illness     Here for Annual Physical and Lab Review  He missed his last annual physical exam, which was usually scheduled in November, due to an episode at the end of October. He intended to return in November for the check-up.  He has gained about eight pounds over the last four to five months, attributing it to the holiday season. His BMI is 27, and he is making an effort to lose the weight gained during the holidays.  HYPERTENSION Well controlled, Home BP readings 120s/70-80s Occasional elevated at home, 140/87 today at home but normal here Followed by Dr Mayford Knife Cardiology On Amlodipine 5mg  daily Off BB metoprolol due to bradycardia Denies CP, dyspnea, HA, edema, dizziness / lightheadedness   History of Syncope    CAD, Severe Aortic Stenosis s/p AVR - Followed by [facility] (Dr Armanda Magic) / Cardiothoracic Surgery Dr Donata Clay S/p AVR CABG. He has done well. Completed cardiac rehab. Future ECHO 1 year- Last ECHO 2022, will continue to follow - On ASA 81mg  daily recently seen, he is doing well and exercising, walking regularly daily, he maintains HR with exercise  Thrombocytopenia, chronic Last lab showed mild low 124 plt, stable from previous range 99-113. Previous history of frequently low PLT - peripheral smear was giant platelet and clumping. No new concerns Denies bleeding or bruising  HYPERLIPIDEMIA: - Followed by Cardiology, has been on high dose lipitor with Atorvastatin 80mg  daily, goal LDL 36 (goal < 55) Last lab lipid 04/2023 was controlled LDL 36. HDL normal.   History of Dermatology [facility], Dr Gwen Pounds Prior  malignant melanoma removed. AKs frozen in past  OSA on CPAP He is doing well. Controlled on auto CPAP. Using nightly. No concerns.    Lab review shows missing TSH lab. Request to add on today from recent lab  Additional history  Constipation He has experienced increased constipation over the past year, which is unusual for him as he was previously 'regular as clockwork'. He drinks a lot of water and eats a high-fiber diet, including fresh fruits, vegetables, salads, spinach, oatmeal, and whole grain breads. He has tried stool softeners like Colace, which work, but he does not want to depend on them. MiraLAX was ineffective for him. He wonders if the constipation could be related to dose increase amlodipine from 2.5 to 5mg  dose for blood pressure management about a year ago, however he has been on 2.5 low dose for years prior.   Health Maintenance:   Vaccines updated Shingles, PNEUMONIA Prevnar-20, Flu   Cologuard screening negative 04/12/20 - repeat 3 years, 04/2023 - now due for re order   Prostate CA Screening: Last prostate CA screening PSA 0.77 (04/2023) previous range 0.69 to 0.86, prior DRE reported normal. Currently asymptomatic. No known family history of prostate CA      03/17/2022    2:13 PM 02/24/2022   11:20 AM 02/18/2021    9:22 AM  Depression screen PHQ 2/9  Decreased Interest 0 0 0  Down, Depressed, Hopeless 0 0 0  PHQ - 2 Score 0 0 0  Altered sleeping 0  0  Tired, decreased energy 0  0  Change in appetite 0  0  Feeling bad or failure about yourself  0  0  Trouble concentrating 0  0  Moving slowly or fidgety/restless 0  0  Suicidal thoughts 0  0  PHQ-9 Score 0  0  Difficult doing work/chores Not difficult at all  Not difficult at all       03/17/2022    2:13 PM 02/18/2021    9:23 AM 02/15/2019    9:03 AM  GAD 7 : Generalized Anxiety Score  Nervous, Anxious, on Edge 0 0 0  Control/stop worrying 0 0 0  Worry too much - different things 0 0 0  Trouble  relaxing 0 0 0  Restless 0 0 0  Easily annoyed or irritable 0 0 0  Afraid - awful might happen 0 0 0  Total GAD 7 Score 0 0 0  Anxiety Difficulty Not difficult at all Not difficult at all      Past Medical History:  Diagnosis Date   Aortic stenosis    a. syncope/severe AS s/p bioprosthetic AVR 06/2017.   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 s/p 2V CABG at time of AVR 06/2017.   Carotid bruit    carotid dopplers negative.  Bruit due to heart murmur from AS   Congenital nevus    Diastolic dysfunction    Dysplastic nevus 12/12/2008   Left mid back, 9cm lat. to spine. Moderate atypia, extends to one edge.    Dysplastic nevus 12/12/2008   Left mid to low back, 7cm lat. to spine. Moderate atypia, close to margin.    Dysplastic nevus 09/24/2012   Right mid lateral buttocks. Moderate atypia, close to edge.    Dysplastic nevus 08/15/2020   Left mid back infrascapular - excised 09/25/20   Dysplastic nevus 10/02/2020   left post deltoid near tricep, mild atypia   Dysplastic nevus 10/02/2020   right ant lateral prox thigh, severe, Shave removal 11/19/20   Dysplastic nevus 04/11/2021   right mid back lateral near side, moderate atypia   Dysplastic nevus 04/11/2021   left upper back paraspinal, moderate atypia   Dysplastic nevus 04/14/2022   left upper back paraspinal, mod to severe   GERD (gastroesophageal reflux disease)    Headache    optical migraine   Hx of melanoma in situ 06/26/2009   Left superior thigh. Arising in dysplastic nevus, close to margin. Excised 07/11/2009, margins free.   Hyperlipidemia    LDL goal < 70   LVH (left ventricular hypertrophy)    Melanoma of skin (HCC)    Obesity    PFO (patent foramen ovale)    small by echo 2014, but not demonstrated  during intra-op note from CABG/AVR 06/2017.   Past Surgical History:  Procedure Laterality Date   AORTIC VALVE REPLACEMENT N/A 06/25/2017   Procedure: AORTIC VALVE REPLACEMENT (AVR) using Magna Ease Aortic Valve size 23mm;  Surgeon: Kerin Perna, MD;  Location: [facility] OR;  Service: Open Heart Surgery;  Laterality: N/A;   arthritic cyst removal  2017   from Lumbar 4-5, dr pool   CARDIAC CATHETERIZATION N/A 02/20/2016   Procedure: Right/Left Heart Cath and Coronary Angiography;  Surgeon: Lyn Records, MD;  Location: [facility] INVASIVE CV LAB;  Service: Cardiovascular;  Laterality: N/A;   CORONARY ARTERY BYPASS GRAFT N/A 06/25/2017   Procedure: CORONARY ARTERY BYPASS GRAFTING (CABG) x Two , using left internal mammary artery and right leg greater saphenous vein harvested endoscopically;  Surgeon: Kerin Perna, MD;  Location: [facility] OR;  Service: Open Heart Surgery;  Laterality: N/A;   CORONARY/GRAFT ANGIOGRAPHY N/A 06/02/2017   Procedure: CORONARY/GRAFT ANGIOGRAPHY;  Surgeon: Yvonne Kendall, MD;  Location: [facility] INVASIVE CV LAB;  Service: Cardiovascular;  Laterality: N/A;   EYE SURGERY     MELANOMA EXCISION  2011   left thigh   RIGHT HEART CATH N/A 06/02/2017   Procedure: RIGHT HEART CATH;  Surgeon: Yvonne Kendall, MD;  Location: [facility] INVASIVE CV LAB;  Service: Cardiovascular;  Laterality: N/A;   TEE WITHOUT CARDIOVERSION N/A 06/25/2017   Procedure: TRANSESOPHAGEAL ECHOCARDIOGRAM (TEE);  Surgeon: Donata Clay, Theron Arista, MD;  Location: [facility] OR;  Service: Open Heart Surgery;  Laterality: N/A;   TONSILLECTOMY     Social History   Socioeconomic History   Marital status: Married    Spouse name: Annabelle Harman   Number of children: 0   Years of education: Assoc degree   Highest education level: Associate degree: occupational, Scientist, product/process development, or vocational program  Occupational History   Occupation: Emergency planning/management officer - [employer]  Tobacco Use   Smoking status: Former    Current packs/day: 0.00    Types: Cigarettes    Quit  date: 04/07/1976    Years since quitting: 47.1   Smokeless tobacco: Former   Tobacco comments:    quit in 78, teenager  Vaping Use   Vaping status: Never Used  Substance and Sexual Activity   Alcohol use: Yes    Alcohol/week: 1.0 standard drink of alcohol    Types: 1 Cans of beer per week    Comment: occasional   Drug use: No    Comment: quit marijuana in 1990   Sexual activity: Yes  Other Topics Concern   Not on file  Social History Narrative   Lives with wife   Caffeine < 1 cup daily   Social Drivers of Health   Financial Resource Strain: Low Risk  (05/11/2023)   Overall Financial Resource Strain (CARDIA)    Difficulty of Paying Living Expenses: Not hard at all  Food Insecurity: No Food Insecurity (05/11/2023)   Hunger Vital Sign    Worried About Running Out of Food in the Last Year: Never true    Ran Out of Food in the Last Year: Never true  Transportation Needs: No Transportation Needs (05/11/2023)   PRAPARE - Administrator, Civil Service (Medical): No    Lack of Transportation (Non-Medical): No  Physical Activity: Sufficiently Active (05/11/2023)   Exercise Vital Sign    Days of Exercise per Week: 5 days    Minutes of Exercise per Session: 100 min  Stress: No Stress Concern Present (05/11/2023)   [employer] of Occupational Health - Occupational Stress Questionnaire    Feeling of Stress : Not at all  Social Connections: Moderately Integrated (05/11/2023)   Social Connection and Isolation Panel [NHANES]    Frequency of Communication with Friends and Family: More than three times a week    Frequency of Social Gatherings with Friends and Family: Twice a week    Attends Religious Services: Never    Database administrator or Organizations: Yes    Attends Engineer, structural: More than 4 times  per year    Marital Status: Married  Catering manager Violence: Not At Risk (05/31/2017)   Humiliation, Afraid, Rape, and Kick questionnaire    Fear of Current or  Ex-Partner: No    Emotionally Abused: No    Physically Abused: No    Sexually Abused: No   Family History  Problem Relation Age of Onset   Diabetes Mother    Heart disease Mother    Arthritis Father    Arthritis Sister    Heart disease Paternal Grandmother        MI   Heart attack Other        family history   Pancreatic cancer Maternal Aunt    Current Outpatient Medications on File Prior to Visit  Medication Sig   amLODipine (NORVASC) 5 MG tablet Take 1 tablet (5 mg total) by mouth daily.   aspirin EC 81 MG tablet Take 81 mg by mouth daily.   atorvastatin (LIPITOR) 80 MG tablet Take 1 tablet (80 mg total) by mouth at bedtime.   azelastine (ASTELIN) 0.1 % nasal spray Place 2 sprays into both nostrils 2 (two) times daily. Use in each nostril as directed   cholecalciferol (VITAMIN D) 1000 units tablet Take 1,000 Units by mouth every evening.   Coenzyme Q10 (COQ-10) 200 MG CAPS Take 200 mg by mouth daily.   meclizine (ANTIVERT) 25 MG tablet Take 1 tablet (25 mg total) by mouth 3 (three) times daily as needed for dizziness.   ondansetron (ZOFRAN-ODT) 4 MG disintegrating tablet Take 1 tablet (4 mg total) by mouth every 8 (eight) hours as needed for nausea or vomiting.   No current facility-administered medications on file prior to visit.    Review of Systems  Constitutional:  Negative for activity change, appetite change, chills, diaphoresis, fatigue and fever.  HENT:  Negative for congestion and hearing loss.   Eyes:  Negative for visual disturbance.  Respiratory:  Negative for cough, chest tightness, shortness of breath and wheezing.   Cardiovascular:  Negative for chest pain, palpitations and leg swelling.  Gastrointestinal:  Negative for abdominal pain, constipation, diarrhea, nausea and vomiting.  Genitourinary:  Negative for dysuria, frequency and hematuria.  Musculoskeletal:  Negative for arthralgias and neck pain.  Skin:  Negative for rash.  Neurological:  Negative for  dizziness, weakness, light-headedness, numbness and headaches.  Hematological:  Negative for adenopathy.  Psychiatric/Behavioral:  Negative for behavioral problems, dysphoric mood and sleep disturbance.    Per HPI unless specifically indicated above     Objective:    BP 122/80   Pulse 69   Ht 5' 9.5" (1.765 m)   Wt 187 lb (84.8 kg)   SpO2 99%   BMI 27.22 kg/m   Wt Readings from Last 3 Encounters:  05/11/23 187 lb (84.8 kg)  02/04/23 179 lb (81.2 kg)  10/16/22 178 lb (80.7 kg)    Physical Exam Vitals and nursing note reviewed.  Constitutional:      General: He is not in acute distress.    Appearance: He is well-developed. He is not diaphoretic.     Comments: Well-appearing, comfortable, cooperative  HENT:     Head: Normocephalic and atraumatic.  Eyes:     General:        Right eye: No discharge.        Left eye: No discharge.     Conjunctiva/sclera: Conjunctivae normal.     Pupils: Pupils are equal, round, and reactive to light.  Neck:     Thyroid: No  thyromegaly.  Cardiovascular:     Rate and Rhythm: Normal rate and regular rhythm.     Pulses: Normal pulses.     Heart sounds: Normal heart sounds. No murmur heard. Pulmonary:     Effort: Pulmonary effort is normal. No respiratory distress.     Breath sounds: Normal breath sounds. No wheezing or rales.  Abdominal:     General: Bowel sounds are normal. There is no distension.     Palpations: Abdomen is soft. There is no mass.     Tenderness: There is no abdominal tenderness.  Musculoskeletal:        General: No tenderness. Normal range of motion.     Cervical back: Normal range of motion and neck supple.     Right lower leg: No edema.     Left lower leg: No edema.     Comments: Upper / Lower Extremities: - Normal muscle tone, strength bilateral upper extremities 5/5, lower extremities 5/5  Lymphadenopathy:     Cervical: No cervical adenopathy.  Skin:    General: Skin is warm and dry.     Findings: No erythema or  rash.  Neurological:     Mental Status: He is alert and oriented to person, place, and time.     Comments: Distal sensation intact to light touch all extremities  Psychiatric:        Mood and Affect: Mood normal.        Behavior: Behavior normal.        Thought Content: Thought content normal.     Comments: Well groomed, good eye contact, normal speech and thoughts     Results for orders placed or performed in visit on 05/07/23  PSA   Collection Time: 05/07/23  8:24 AM  Result Value Ref Range   PSA 0.77 < OR = 4.00 ng/mL  CBC with Differential/Platelet   Collection Time: 05/07/23  8:24 AM  Result Value Ref Range   WBC 3.7 (L) 3.8 - 10.8 Thousand/uL   RBC 5.01 4.20 - 5.80 Million/uL   Hemoglobin 15.6 13.2 - 17.1 g/dL   HCT 16.1 09.6 - 04.5 %   MCV 92.4 80.0 - 100.0 fL   MCH 31.1 27.0 - 33.0 pg   MCHC 33.7 32.0 - 36.0 g/dL   RDW 40.9 81.1 - 91.4 %   Platelets 124 (L) 140 - 400 Thousand/uL   MPV 12.3 7.5 - 12.5 fL   Neutro Abs 2,139 1,500 - 7,800 cells/uL   Absolute Lymphocytes 958 850 - 3,900 cells/uL   Absolute Monocytes 337 200 - 950 cells/uL   Eosinophils Absolute 207 15 - 500 cells/uL   Basophils Absolute 59 0 - 200 cells/uL   Neutrophils Relative % 57.8 %   Total Lymphocyte 25.9 %   Monocytes Relative 9.1 %   Eosinophils Relative 5.6 %   Basophils Relative 1.6 %  COMPLETE METABOLIC PANEL WITH GFR   Collection Time: 05/07/23  8:24 AM  Result Value Ref Range   Glucose, Bld 91 65 - 99 mg/dL   BUN 15 7 - 25 mg/dL   Creat 7.82 9.56 - 2.13 mg/dL   eGFR 95 > OR = 60 YQ/MVH/8.46N6   BUN/Creatinine Ratio SEE NOTE: 6 - 22 (calc)   Sodium 139 135 - 146 mmol/L   Potassium 4.6 3.5 - 5.3 mmol/L   Chloride 105 98 - 110 mmol/L   CO2 27 20 - 32 mmol/L   Calcium 9.6 8.6 - 10.3 mg/dL   Total Protein 6.5 6.1 -  8.1 g/dL   Albumin 4.7 3.6 - 5.1 g/dL   Globulin 1.8 (L) 1.9 - 3.7 g/dL (calc)   AG Ratio 2.6 (H) 1.0 - 2.5 (calc)   Total Bilirubin 1.1 0.2 - 1.2 mg/dL   Alkaline  phosphatase (APISO) 94 35 - 144 U/L   AST 25 10 - 35 U/L   ALT 22 9 - 46 U/L  Hemoglobin A1c   Collection Time: 05/07/23  8:24 AM  Result Value Ref Range   Hgb A1c MFr Bld 5.3 <5.7 % of total Hgb   Mean Plasma Glucose 105 mg/dL   eAG (mmol/L) 5.8 mmol/L  Lipid panel   Collection Time: 05/07/23  8:24 AM  Result Value Ref Range   Cholesterol 105 <200 mg/dL   HDL 54 > OR = 40 mg/dL   Triglycerides 74 <956 mg/dL   LDL Cholesterol (Calc) 36 mg/dL (calc)   Total CHOL/HDL Ratio 1.9 <5.0 (calc)   Non-HDL Cholesterol (Calc) 51 <213 mg/dL (calc)  TSH   Collection Time: 05/07/23  8:24 AM  Result Value Ref Range   TSH 1.30 0.40 - 4.50 mIU/L  TEST AUTHORIZATION   Collection Time: 05/07/23  8:24 AM  Result Value Ref Range   TEST NAME: TSH    TEST CODE: [id number]    CLIENT CONTACT: Dr Adrian Blackwater    REPORT ALWAYS MESSAGE SIGNATURE        Assessment & Plan:   Problem List Items Addressed This Visit     Aneurysm of thoracic aorta (HCC)   Essential hypertension   Hypercholesteremia   OSA on CPAP   Thrombocytopenia (HCC)   Other Visit Diagnoses       Annual physical exam    -  Primary     Screening for colon cancer       Relevant Orders   Cologuard        Updated Health Maintenance information Reviewed recent lab results with patient Encouraged improvement to lifestyle with diet and exercise Goal of weight loss  Aortic Stenosis, s/p aortic valve bioprosthetic Hyperlipidemia Followed by Cardiology Upcoming ECHO planned LDL maintained < 55, at 36 range, indicating excellent control. Continue Atorvastatin 80mg  daily, ASA 81mg   Constipation New onset over the past year, despite high fiber diet and adequate hydration. Possible side effect of amlodipine < 1% seems rare -Continue current regimen of intermittent stool softeners. -Consider trying different approaches such as fiber supplements or prolonged use of MiraLAX.  Hypertension, controlled Blood pressure readings  slightly elevated at home but normal in office. Currently on amlodipine -Continue amlodipine 5mg  -Monitor blood pressure at home and report any significant changes.  OSA on CPAP Well controlled, chronic OSA on CPAP - Good adherence to CPAP nightly - Continue current CPAP therapy, patient seems to be benefiting from therapy   General Health Maintenance -Order Cologuard test today due in February 2025. -Continue current immunization schedule (COVID booster received in September 2022, Prevnar 20 received in 2022). -Plan to check thyroid function next year, pending insurance approval. Add on extra lab today from previous blood draw -Follow-up appointment scheduled for February 2026.         Orders Placed This Encounter  Procedures   Cologuard    No orders of the defined types were placed in this encounter.    Follow up plan: Return for 1 year fasting lab > 1 week later Annual Physical.  Saralyn Pilar, DO [facility] 05/11/2023, 8:13 AM

## 2023-05-13 ENCOUNTER — Ambulatory Visit: Payer: Medicare PPO | Admitting: Dermatology

## 2023-05-13 DIAGNOSIS — D229 Melanocytic nevi, unspecified: Secondary | ICD-10-CM

## 2023-05-13 DIAGNOSIS — L814 Other melanin hyperpigmentation: Secondary | ICD-10-CM | POA: Diagnosis not present

## 2023-05-13 DIAGNOSIS — W908XXA Exposure to other nonionizing radiation, initial encounter: Secondary | ICD-10-CM

## 2023-05-13 DIAGNOSIS — D2221 Melanocytic nevi of right ear and external auricular canal: Secondary | ICD-10-CM

## 2023-05-13 DIAGNOSIS — Z86018 Personal history of other benign neoplasm: Secondary | ICD-10-CM

## 2023-05-13 DIAGNOSIS — D2271 Melanocytic nevi of right lower limb, including hip: Secondary | ICD-10-CM

## 2023-05-13 DIAGNOSIS — Z1283 Encounter for screening for malignant neoplasm of skin: Secondary | ICD-10-CM | POA: Diagnosis not present

## 2023-05-13 DIAGNOSIS — L821 Other seborrheic keratosis: Secondary | ICD-10-CM

## 2023-05-13 DIAGNOSIS — Z86006 Personal history of melanoma in-situ: Secondary | ICD-10-CM

## 2023-05-13 DIAGNOSIS — Z1211 Encounter for screening for malignant neoplasm of colon: Secondary | ICD-10-CM | POA: Diagnosis not present

## 2023-05-13 DIAGNOSIS — L820 Inflamed seborrheic keratosis: Secondary | ICD-10-CM

## 2023-05-13 DIAGNOSIS — L578 Other skin changes due to chronic exposure to nonionizing radiation: Secondary | ICD-10-CM

## 2023-05-13 DIAGNOSIS — L738 Other specified follicular disorders: Secondary | ICD-10-CM

## 2023-05-13 DIAGNOSIS — D1801 Hemangioma of skin and subcutaneous tissue: Secondary | ICD-10-CM

## 2023-05-13 NOTE — Progress Notes (Signed)
 Follow-Up Visit   Subjective  Todd Frank is a 68 y.o. male who presents for the following: Skin Cancer Screening and Full Body Skin Exam Hx of Melanoma IS, hx of Dysplastic Nevi  The patient presents for Total-Body Skin Exam (TBSE) for skin cancer screening and mole check. The patient has spots, moles and lesions to be evaluated, some may be new or changing and the patient may have concern these could be cancer.  Itchy spot on back.    The following portions of the chart were reviewed this encounter and updated as appropriate: medications, allergies, medical history  Review of Systems:  No other skin or systemic complaints except as noted in HPI or Assessment and Plan.  Objective  Well appearing patient in no apparent distress; mood and affect are within normal limits.  A full examination was performed including scalp, head, eyes, ears, nose, lips, neck, chest, axillae, abdomen, back, buttocks, bilateral upper extremities, bilateral lower extremities, hands, feet, fingers, toes, fingernails, and toenails. All findings within normal limits unless otherwise noted below.   Relevant physical exam findings are noted in the Assessment and Plan.  R spinal mid lower back x 1 Stuck on waxy paps with erythema  Assessment & Plan   SKIN CANCER SCREENING PERFORMED TODAY.  ACTINIC DAMAGE - Chronic condition, secondary to cumulative UV/sun exposure - diffuse scaly erythematous macules with underlying dyspigmentation - Recommend daily broad spectrum sunscreen SPF 30+ to sun-exposed areas, reapply every 2 hours as needed.  - Staying in the shade or wearing long sleeves, sun glasses (UVA+UVB protection) and wide brim hats (4-inch brim around the entire circumference of the hat) are also recommended for sun protection.  - Call for new or changing lesions.  LENTIGINES, SEBORRHEIC KERATOSES, HEMANGIOMAS - Benign normal skin lesions - Benign-appearing - Call for any changes  MELANOCYTIC  NEVI - Tan-brown and/or pink-flesh-colored symmetric macules and papules - R foot dorsum 2.50mm 2 tone med brown macule - R post ear 3.48mm med brown macule - Benign appearing on exam today - Observation - Call clinic for new or changing moles - Recommend daily use of broad spectrum spf 30+ sunscreen to sun-exposed areas.   HISTORY OF MELANOMA IN SITU - No evidence of recurrence today- L sup thigh 07/11/2009 - Recommend regular full body skin exams - Recommend daily broad spectrum sunscreen SPF 30+ to sun-exposed areas, reapply every 2 hours as needed.  - Call if any new or changing lesions are noted between office visits   HISTORY OF DYSPLASTIC NEVUS- Multiple No evidence of recurrence today Recommend regular full body skin exams Recommend daily broad spectrum sunscreen SPF 30+ to sun-exposed areas, reapply every 2 hours as needed.  Call if any new or changing lesions are noted between office visits   Sebaceous Hyperplasia face - Small yellow papules with a central dell - Benign-appearing - Observe. Call for changes.  INFLAMED SEBORRHEIC KERATOSIS R spinal mid lower back x 1 Symptomatic, irritating, patient would like treated. Destruction of lesion - R spinal mid lower back x 1  Destruction method: cryotherapy   Informed consent: discussed and consent obtained   Lesion destroyed using liquid nitrogen: Yes   Region frozen until ice ball extended beyond lesion: Yes   Outcome: patient tolerated procedure well with no complications   Post-procedure details: wound care instructions given   Additional details:  Prior to procedure, discussed risks of blister formation, small wound, skin dyspigmentation, or rare scar following cryotherapy. Recommend Vaseline ointment to treated areas  while healing.   Return in about 1 year (around 05/12/2024) for TBSE, Hx of Melanoma IS, Hx of Dysplastic nevi.  I, Grayce Saunas, RMA, am acting as scribe for Rexene Rattler, MD .   Documentation: I have  reviewed the above documentation for accuracy and completeness, and I agree with the above.  Rexene Rattler, MD

## 2023-05-13 NOTE — Patient Instructions (Addendum)
 Cryotherapy Aftercare  Wash gently with soap and water everyday.   Apply Vaseline and Band-Aid daily until healed.    Melanoma ABCDEs  Melanoma is the most dangerous type of skin cancer, and is the leading cause of death from skin disease.  You are more likely to develop melanoma if you: Have light-colored skin, light-colored eyes, or red or blond hair Spend a lot of time in the sun Tan regularly, either outdoors or in a tanning bed Have had blistering sunburns, especially during childhood Have a close family member who has had a melanoma Have atypical moles or large birthmarks  Early detection of melanoma is key since treatment is typically straightforward and cure rates are extremely high if we catch it early.   The first sign of melanoma is often a change in a mole or a new dark spot.  The ABCDE system is a way of remembering the signs of melanoma.  A for asymmetry:  The two halves do not match. B for border:  The edges of the growth are irregular. C for color:  A mixture of colors are present instead of an even brown color. D for diameter:  Melanomas are usually (but not always) greater than 6mm - the size of a pencil eraser. E for evolution:  The spot keeps changing in size, shape, and color.  Please check your skin once per month between visits. You can use a small mirror in front and a large mirror behind you to keep an eye on the back side or your body.   If you see any new or changing lesions before your next follow-up, please call to schedule a visit.  Please continue daily skin protection including broad spectrum sunscreen SPF 30+ to sun-exposed areas, reapplying every 2 hours as needed when you're outdoors.   Staying in the shade or wearing long sleeves, sun glasses (UVA+UVB protection) and wide brim hats (4-inch brim around the entire circumference of the hat) are also recommended for sun protection.     Due to recent changes in healthcare laws, you may see results of  your pathology and/or laboratory studies on MyChart before the doctors have had a chance to review them. We understand that in some cases there may be results that are confusing or concerning to you. Please understand that not all results are received at the same time and often the doctors may need to interpret multiple results in order to provide you with the best plan of care or course of treatment. Therefore, we ask that you please give [place] 2 business days to thoroughly review all your results before contacting the office for clarification. Should we see a critical lab result, you will be contacted sooner.   If You Need Anything After Your Visit  If you have any questions or concerns for your doctor, please call our main line at [phone] and press option 4 to reach your doctor's medical assistant. If no one answers, please leave a voicemail as directed and we will return your call as soon as possible. Messages left after 4 pm will be answered the following business day.   You may also send [place] a message via MyChart. We typically respond to MyChart messages within 1-2 business days.  For prescription refills, please ask your pharmacy to contact our office. Our fax number is [phone].  If you have an urgent issue when the clinic is closed that cannot wait until the next business day, you can page your doctor at the number  below.    Please note that while we do our best to be available for urgent issues outside of office hours, we are not available 24/7.   If you have an urgent issue and are unable to reach [place], you may choose to seek medical care at your doctor's office, retail clinic, urgent care center, or emergency room.  If you have a medical emergency, please immediately call 911 or go to the emergency department.  Pager Numbers  - Dr. Gwen Pounds: [phone]  - Dr. Roseanne Reno: [phone]  - Dr. Katrinka Blazing: [phone]   In the event of inclement weather, please call our main line at  [phone] for an update on the status of any delays or closures.  Dermatology Medication Tips: Please keep the boxes that topical medications come in in order to help keep track of the instructions about where and how to use these. Pharmacies typically print the medication instructions only on the boxes and not directly on the medication tubes.   If your medication is too expensive, please contact our office at [phone] option 4 or send [place] a message through MyChart.   We are unable to tell what your co-pay for medications will be in advance as this is different depending on your insurance coverage. However, we may be able to find a substitute medication at lower cost or fill out paperwork to get insurance to cover a needed medication.   If a prior authorization is required to get your medication covered by your insurance company, please allow [place] 1-2 business days to complete this process.  Drug prices often vary depending on where the prescription is filled and some pharmacies may offer cheaper prices.  The website www.goodrx.com contains coupons for medications through different pharmacies. The prices here do not account for what the cost may be with help from insurance (it may be cheaper with your insurance), but the website can give you the price if you did not use any insurance.  - You can print the associated coupon and take it with your prescription to the pharmacy.  - You may also stop by our office during regular business hours and pick up a GoodRx coupon card.  - If you need your prescription sent electronically to a different pharmacy, notify our office through [facility] or by phone at [phone] option 4.     Si Usted Necesita Algo Despus de Su Visita  Tambin puede enviarnos un mensaje a travs de Clinical cytogeneticist. Por lo general respondemos a los mensajes de MyChart en el transcurso de 1 a 2 das hbiles.  Para renovar recetas, por favor pida a su farmacia que se  ponga en contacto con nuestra oficina. Annie Sable de fax es [place] [phone].  Si tiene un asunto urgente cuando la clnica est cerrada y que no puede esperar hasta el siguiente da hbil, puede llamar/localizar a su doctor(a) al nmero que aparece a continuacin.   Por favor, tenga en cuenta que aunque hacemos todo lo posible para estar disponibles para asuntos urgentes fuera del horario de [place], no estamos disponibles las 24 horas del da, los 7 [address]  [address] de la [place].   Si tiene un problema urgente y no puede comunicarse con nosotros, puede optar por buscar atencin mdica  en el consultorio de su doctor(a), en una clnica privada, en un centro de atencin urgente o en una sala de emergencias.  Si tiene Engineer, drilling, por favor llame inmediatamente al 911 o vaya a la sala de emergencias.  Nmeros de  bper  - Dr. Gwen Pounds: [phone]  - Dra. Roseanne Reno: [phone]  - Dr. Katrinka Blazing: [phone]   En caso de inclemencias del tiempo, por favor llame a Lacy Duverney principal al [phone] para una actualizacin sobre el [place] de cualquier retraso o cierre.  Consejos para la medicacin en dermatologa: Por favor, guarde las cajas en las que vienen los medicamentos de uso tpico para ayudarle a seguir las instrucciones sobre dnde y cmo usarlos. Las farmacias generalmente imprimen las instrucciones del medicamento slo en las cajas y no directamente en los tubos del [place].   Si su medicamento es muy caro, por favor, pngase en contacto con Rolm Gala llamando al [phone] y presione la opcin 4 o envenos un mensaje a travs de Clinical cytogeneticist.   No podemos decirle cul ser su copago por los medicamentos por adelantado ya que esto es diferente dependiendo de la cobertura de su seguro. Sin embargo, es posible que podamos encontrar un medicamento sustituto a Audiological scientist un formulario para que el seguro cubra el medicamento que se considera necesario.   Si se requiere  una autorizacin previa para que su compaa de seguros [place] su medicamento, por favor permtanos de 1 a 2 das hbiles para completar [address]  Los precios de los medicamentos varan con frecuencia dependiendo del Environmental consultant de dnde se surte la receta y alguna farmacias pueden ofrecer precios ms baratos.  El sitio web www.goodrx.com tiene cupones para medicamentos de Health and safety inspector. Los precios aqu no tienen en cuenta lo que podra costar con la ayuda del seguro (puede ser ms barato con su seguro), pero el sitio web puede darle el precio si no utiliz Tourist information centre manager.  - Puede imprimir el cupn correspondiente y llevarlo con su receta a la farmacia.  - Tambin puede pasar por nuestra oficina durante el horario de atencin regular y Education officer, museum una tarjeta de cupones de GoodRx.  - Si necesita que su receta se enve electrnicamente a una farmacia diferente, informe a nuestra oficina a travs de MyChart de  o por telfono llamando al [phone] y presione la opcin 4.

## 2023-05-14 DIAGNOSIS — G4733 Obstructive sleep apnea (adult) (pediatric): Secondary | ICD-10-CM | POA: Diagnosis not present

## 2023-05-17 ENCOUNTER — Other Ambulatory Visit: Payer: Self-pay | Admitting: Family Medicine

## 2023-05-17 DIAGNOSIS — I10 Essential (primary) hypertension: Secondary | ICD-10-CM

## 2023-05-18 NOTE — Telephone Encounter (Signed)
 Requested Prescriptions  Pending Prescriptions Disp Refills   amLODipine  (NORVASC ) 5 MG tablet [Pharmacy Med Name: AMLODIPINE  BESYLATE 5MG  TABLETS] 90 tablet 1    Sig: TAKE 1 TABLET(5 MG) BY MOUTH DAILY     Cardiovascular: Calcium  Channel Blockers 2 Passed - 05/18/2023  3:07 PM      Passed - Last BP in normal range    BP Readings from Last 1 Encounters:  05/11/23 122/80         Passed - Last Heart Rate in normal range    Pulse Readings from Last 1 Encounters:  05/11/23 69         Passed - Valid encounter within last 6 months    Recent Outpatient Visits           3 months ago Vaso vagal episode   [place] [facility] Maskell, Kayleen Party, DO   1 year ago Essential hypertension   [place] [facility] Raina Bunting, DO   1 year ago Primary osteoarthritis of left knee   [facility] Dash, Dessie Flow, MD   1 year ago Arthralgia of left knee   [facility] Dash, Dessie Flow, MD   1 year ago Annual physical exam   [place] [facility] Raina Bunting, DO       Future Appointments             In 11 months Romeo Co, Kayleen Party, DO [facility], [place]   In 1 year Elta Halter, MD [facility]

## 2023-05-20 ENCOUNTER — Encounter: Payer: Self-pay | Admitting: Family Medicine

## 2023-05-20 LAB — COLOGUARD: COLOGUARD: NEGATIVE

## 2023-11-08 ENCOUNTER — Other Ambulatory Visit: Payer: Self-pay | Admitting: Family Medicine

## 2023-11-08 DIAGNOSIS — I10 Essential (primary) hypertension: Secondary | ICD-10-CM

## 2023-11-09 ENCOUNTER — Other Ambulatory Visit: Payer: Self-pay | Admitting: Cardiology

## 2023-11-10 NOTE — Telephone Encounter (Signed)
 Requested Prescriptions  Pending Prescriptions Disp Refills   amLODipine  (NORVASC ) 5 MG tablet [Pharmacy Med Name: AMLODIPINE  BESYLATE 5MG  TABLETS] 90 tablet 1    Sig: TAKE 1 TABLET(5 MG) BY MOUTH DAILY     Cardiovascular: Calcium  Channel Blockers 2 Failed - 11/10/2023  9:57 AM      Failed - Valid encounter within last 6 months    Recent Outpatient Visits           6 months ago Annual physical exam   [place] [facility] Edman Marsa PARAS, DO       Future Appointments             In 6 months Edman, Marsa PARAS, DO [facility]   In 6 months Hester Alm BROCKS, MD [facility]            Passed - Last BP in normal range    BP Readings from Last 1 Encounters:  05/11/23 122/80         Passed - Last Heart Rate in normal range    Pulse Readings from Last 1 Encounters:  05/11/23 69

## 2023-11-11 DIAGNOSIS — G4733 Obstructive sleep apnea (adult) (pediatric): Secondary | ICD-10-CM | POA: Diagnosis not present

## 2023-11-17 ENCOUNTER — Encounter: Payer: Self-pay | Admitting: Cardiology

## 2023-12-10 ENCOUNTER — Ambulatory Visit: Attending: Cardiology | Admitting: Cardiology

## 2023-12-10 ENCOUNTER — Encounter: Payer: Self-pay | Admitting: Cardiology

## 2023-12-10 VITALS — BP 114/84 | HR 68 | Ht 69.5 in | Wt 183.6 lb

## 2023-12-10 DIAGNOSIS — I2510 Atherosclerotic heart disease of native coronary artery without angina pectoris: Secondary | ICD-10-CM | POA: Diagnosis not present

## 2023-12-10 DIAGNOSIS — Z953 Presence of xenogenic heart valve: Secondary | ICD-10-CM

## 2023-12-10 DIAGNOSIS — I77810 Thoracic aortic ectasia: Secondary | ICD-10-CM | POA: Diagnosis not present

## 2023-12-10 DIAGNOSIS — Z01812 Encounter for preprocedural laboratory examination: Secondary | ICD-10-CM | POA: Diagnosis not present

## 2023-12-10 DIAGNOSIS — E7800 Pure hypercholesterolemia, unspecified: Secondary | ICD-10-CM

## 2023-12-10 DIAGNOSIS — I10 Essential (primary) hypertension: Secondary | ICD-10-CM

## 2023-12-10 DIAGNOSIS — I350 Nonrheumatic aortic (valve) stenosis: Secondary | ICD-10-CM

## 2023-12-10 DIAGNOSIS — G4733 Obstructive sleep apnea (adult) (pediatric): Secondary | ICD-10-CM | POA: Diagnosis not present

## 2023-12-10 DIAGNOSIS — R55 Syncope and collapse: Secondary | ICD-10-CM | POA: Diagnosis not present

## 2023-12-10 NOTE — Progress Notes (Signed)
 Date:  12/10/2023   ID:  Todd Frank, DOB 25-Dec-1955, MRN [id number]  PCP:  [place] Marsa PARAS, DO  Cardiologist:  Wilbert Bihari, MD  Electrophysiologist:  OLE ONEIDA HOLTS, MD  Chief Complaint:  CAD, bicuspid AV, OSA, HTN, HLD  History of Present Illness:    Todd Frank is a 68 y.o. male with a hx of HTN, dyslipidemia, bicuspid AV, diastolic dysfunction, dilated aorta, noted prior small PFO and 2 vessel CAD. He was found to have progression of AS after a syncopal episode while jogging. Echocardiogram was obtained and showed an increased gradient across his bicuspid aortic valve which had progressed from previous study. Cardiac catheterization was also performed and showed severe 2 vessel CAD. He underwent CABG x 2 utilizing LIMA to LAD, and SVG to distal PDA, AVR with a 23 mm [employer]. Post op echo showed LVEF 50-55%, no pericardial effusion, no regional wall motion abnormalities and stable AVR.   Due to excessive daytime sleepiness he was also found to have mild obstructive sleep apnea with an AHI of 6.5/h and no significant central sleep apnea and is on CPAP.  He has had problems ever since his bypass surgery with chronic chest pain that had been present ever since his CABG.  ESR and CRP were normal and Lexiscan  myoview  showed no ischemia.  He did have an episode of syncope and saw EP and felt that he had neurocardiogenic syncope but an ILR was placed with only atrial triplet and PVC noted thus far.   He is here for follow-up and is doing well.  He denies any chest pain or pressure, shortness of breath, DOE, PND, orthopnea, lower extremity edema, dizziness, palpitations or syncope.  He is doing well with his PAP device and thinks that he has gotten used to it.  He tolerates the mask and feels the pressure is adequate.  Since going on PAP he feels rested in the am and has less daytime sleepiness.  He denies any significant mouth or nasal dryness or nasal  congestion.  He does not think that he snores. Patient denies any episodes of bruxism, restless legs, No gagging hallucinations or cataplectic events.    Prior CV studies:   The following studies were reviewed today:  PAP compliance download, 2D echo 03/2023  Past Medical History:  Diagnosis Date   Aortic stenosis    a. syncope/severe AS s/p bioprosthetic AVR 06/2017.   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 s/p 2V CABG at time of AVR 06/2017.   Carotid bruit    carotid dopplers negative.  Bruit due to heart murmur from AS   Congenital nevus    Diastolic dysfunction    Dysplastic nevus 12/12/2008   Left mid back, 9cm lat. to spine. Moderate atypia, extends to one edge.    Dysplastic nevus 12/12/2008   Left mid to low back, 7cm lat. to spine. Moderate atypia, close to margin.    Dysplastic nevus 09/24/2012   Right mid lateral buttocks. Moderate atypia, close to edge.    Dysplastic nevus 08/15/2020   Left mid back infrascapular - excised 09/25/20   Dysplastic nevus 10/02/2020   left post deltoid near tricep, mild  atypia   Dysplastic nevus 10/02/2020   right ant lateral prox thigh, severe, Shave removal 11/19/20   Dysplastic nevus 04/11/2021   right mid back lateral near side, moderate atypia   Dysplastic nevus 04/11/2021   left upper back paraspinal, moderate atypia   Dysplastic nevus 04/14/2022   left upper back paraspinal, mod to severe   GERD (gastroesophageal reflux disease)    Headache    optical migraine   Hx of melanoma in situ 06/26/2009   Left superior thigh. Arising in dysplastic nevus, close to margin. Excised 07/11/2009, margins free.   Hyperlipidemia    LDL goal < 70   LVH (left ventricular hypertrophy)    Melanoma of skin (HCC)    Obesity    OSA on  CPAP    mild obstructive sleep apnea with an AHI of 6.5/h and no significant central sleep apnea and is on CPAP.   PFO (patent foramen ovale)    small by echo 2014, but not demonstrated during intra-op note from CABG/AVR 06/2017.   Past Surgical History:  Procedure Laterality Date   AORTIC VALVE REPLACEMENT N/A 06/25/2017   Procedure: AORTIC VALVE REPLACEMENT (AVR) using Magna Ease Aortic Valve size 23mm;  Surgeon: Fleeta Hanford Coy, MD;  Location: [facility] OR;  Service: Open Heart Surgery;  Laterality: N/A;   arthritic cyst removal  2017   from Lumbar 4-5, dr pool   CARDIAC CATHETERIZATION N/A 02/20/2016   Procedure: Right/Left Heart Cath and Coronary Angiography;  Surgeon: Victory LELON Sharps, MD;  Location: [facility] INVASIVE CV LAB;  Service: Cardiovascular;  Laterality: N/A;   CORONARY ARTERY BYPASS GRAFT N/A 06/25/2017   Procedure: CORONARY ARTERY BYPASS GRAFTING (CABG) x Two , using left internal mammary artery and right leg greater saphenous vein harvested endoscopically;  Surgeon: Fleeta Hanford Coy, MD;  Location: [facility] OR;  Service: Open Heart Surgery;  Laterality: N/A;   CORONARY/GRAFT ANGIOGRAPHY N/A 06/02/2017   Procedure: CORONARY/GRAFT ANGIOGRAPHY;  Surgeon: Mady Bruckner, MD;  Location: [facility] INVASIVE CV LAB;  Service: Cardiovascular;  Laterality: N/A;   EYE SURGERY     MELANOMA EXCISION  2011   left thigh   RIGHT HEART CATH N/A 06/02/2017   Procedure: RIGHT HEART CATH;  Surgeon: Mady Bruckner, MD;  Location: [facility] INVASIVE CV LAB;  Service: Cardiovascular;  Laterality: N/A;   TEE WITHOUT CARDIOVERSION N/A 06/25/2017   Procedure: TRANSESOPHAGEAL ECHOCARDIOGRAM (TEE);  Surgeon: Fleeta Hanford, Coy, MD;  Location: [facility] OR;  Service: Open Heart Surgery;  Laterality: N/A;   TONSILLECTOMY       Current Meds  Medication Sig   amLODipine  (NORVASC ) 5 MG tablet TAKE 1 TABLET(5 MG) BY MOUTH DAILY   aspirin  EC 81 MG tablet Take 81 mg by mouth daily.   atorvastatin  (LIPITOR ) 80 MG tablet TAKE 1 TABLET(80 MG) BY  MOUTH AT BEDTIME   azelastine  (ASTELIN ) 0.1 % nasal spray Place 2 sprays into both nostrils 2 (two) times daily. Use in each nostril as directed   cholecalciferol (VITAMIN D ) 1000 units tablet Take 1,000 Units by mouth every evening.   Coenzyme Q10 (COQ-10) 200 MG CAPS Take 200 mg by mouth daily.     Allergies:   Patient has no known allergies.   Social History   Tobacco Use   Smoking status: Former    Current packs/day: 0.00    Types: Cigarettes    Quit date: 04/07/1976    Years since quitting: 47.7   Smokeless tobacco: Former   Tobacco comments:    quit in 39,  teenager  Vaping Use   Vaping status: Never Used  Substance Use Topics   Alcohol use: Yes    Alcohol/week: 1.0 standard drink of alcohol    Types: 1 Cans of beer per week    Comment: occasional   Drug use: No    Comment: quit marijuana in 1990     Family Hx: The patient's family history includes Arthritis in his father and sister; Diabetes in his mother; Heart attack in an other family member; Heart disease in his mother and paternal grandmother; Pancreatic cancer in his maternal aunt.  ROS:   Please see the history of present illness.     All other systems reviewed and are negative.   Labs/Other Tests and Data Reviewed:    Recent Labs: 05/07/2023: ALT 22; BUN 15; Creat 0.84; Hemoglobin 15.6; Platelets 124; Potassium 4.6; Sodium 139; TSH 1.30   Recent Lipid Panel Lab Results  Component Value Date/Time   CHOL 105 05/07/2023 08:24 AM   CHOL 115 02/22/2019 09:28 AM   CHOL 173 11/02/2015 12:00 AM   TRIG 74 05/07/2023 08:24 AM   TRIG 69 11/02/2015 12:00 AM   HDL 54 05/07/2023 08:24 AM   HDL 56 02/22/2019 09:28 AM   CHOLHDL 1.9 05/07/2023 08:24 AM   LDLCALC 36 05/07/2023 08:24 AM   LDLCALC 104 (A) 11/02/2015 12:00 AM    Wt Readings from Last 3 Encounters:  12/10/23 183 lb 9.6 oz (83.3 kg)  05/11/23 187 lb (84.8 kg)  02/04/23 179 lb (81.2 kg)    EKG Interpretation Date/Time:  Thursday December 10 2023  10:31:27 EDT Ventricular Rate:  68 PR Interval:  162 QRS Duration:  98 QT Interval:  408 QTC Calculation: 433 R Axis:   -19  Text Interpretation: Normal sinus rhythm Inferior infarct (cited on or before 25-Jun-2017) Cannot rule out Anterior infarct , age undetermined When compared with ECG of 28-Jun-2017 13:22, T wave inversion no longer evident in Anterior leads QT has shortened Confirmed by Shlomo Corning ([id number]) on 12/10/2023 10:36:07 AM   Objective:    Vital Signs:  BP 114/84   Pulse 68   Ht 5' 9.5 (1.765 m)   Wt 183 lb 9.6 oz (83.3 kg)   SpO2 99%   BMI 26.72 kg/m    GEN: Well nourished, well developed in no acute distress HEENT: Normal NECK: No JVD; No carotid bruits LYMPHATICS: No lymphadenopathy CARDIAC:RRR, no rubs, gallops 2/6 SM at RUSB to LLSB RESPIRATORY:  Clear to auscultation without rales, wheezing or rhonchi  ABDOMEN: Soft, non-tender, non-distended MUSCULOSKELETAL:  No edema; No deformity  SKIN: Warm and dry NEUROLOGIC:  Alert and oriented x 3 PSYCHIATRIC:  Normal affect  EKG was done in office and demonstrates normal sinus rhythm with prior inferior and anterior infarct unchanged from prior EKG  ASSESSMENT & PLAN:      OSA - The patient is tolerating PAP therapy well without any problems.  The patient has been using and benefiting from PAP use and will continue to benefit from therapy.  -I will get a download from his DME   Bicuspid AV with severe AS -s/p  AVR with a 23 mm Edwards magna ease AVR 06/2017 -2D echo 09/2020 showed a stable AVR with mean aortic valve gradient -2D echo 09/11/2022 showed stable AVR with mean aortic valve gradient 24 mmHg  -We had a long discussion about the slight increase in aortic valve gradient of his bioprosthesis.  It does appear he has some mild stenosis of the prosthesis  and will continue to follow the gradient which has slowly increased from 13 mmHg at the time of implant up to 24 mm on last echo.   -Repeat echo  03/13/2023 showed stable AVR with mean aortic valve gradient 25 mmHg -continue ASA -continue ongoing SBE prophylaxis for dental procedures -repeat echo 03/2024  ASCAD  -cath 2019 showed severe 2 vessel CAD  -s/p CABG x 2 utilizing LIMA to LAD, and SVG to distal PDA.   -He has had chronic chest pain ever since his bypass surgery -CRP and ESR were normal and no  pericardial effusion and no thickening of the pericardium -Lexiscan  myoview  showed no ischemia 08/2019 -He has not had any anginal symptoms since I saw him last -Continue drug management with aspirin  81 mg daily and atorvastatin  80 mg daily with as needed refills -He is walking a few miles daily on the treadmill or bike at the gym without any cardiac symptoms.    Dilated ascending aorta  -repeat echo 09/2020 showed a ascending aortic measurement of 47 mm -43 mm by chest CTA 09/13/2020  -41 mm by chest CTA 6/23 -40 mm by 2D echo 09/2022 -45 mm by echo 03/2023 -Check chest CTA for further assessment  Hyperlipidemia -LDL goal < 70.  -I have personally reviewed and interpreted outside labs performed by patient's PCP which showed LDL 36, HDL 54 and ALT 22 on 05/07/2023 -Continue atorvastatin  80 mg daily with as needed refills  HTN -BP controlled on exam today - Continue amlodipine  5 mg daily with as needed refills  Syncope -he had an ILR placed almost 2 years ago with no arrhythmias and now s/p removal   Medication Adjustments/Labs and Tests Ordered: Current medicines are reviewed at length with the patient today.  Concerns regarding medicines are outlined above.  Tests Ordered: Orders Placed This Encounter  Procedures   EKG 12-Lead   Medication Changes: No orders of the defined types were placed in this encounter.   Disposition:  Follow up in 1 year(s)  Signed, Wilbert Bihari, MD  12/10/2023 10:41 AM    [facility]

## 2023-12-10 NOTE — Patient Instructions (Addendum)
 Medication Instructions:  Your physician recommends that you continue on your current medications as directed. Please refer to the Current Medication list given to you today.  *If you need a refill on your cardiac medications before your next appointment, please call your pharmacy*  Lab Work: Please complete a BMET in our first floor lab before you leave today. You do not need to be fasting.  If you have labs (blood work) drawn today and your tests are completely normal, you will receive your results only by: MyChart Message (if you have MyChart) OR A paper copy in the mail If you have any lab test that is abnormal or we need to change your treatment, we will call you to review the results.  Testing/Procedures:   Your cardiac CT will be scheduled at one of the below locations:   [facility] [address] [place], [place] [address] [phone] (Severe contrast allergies only)  OR   [facility] [address]  [place], [place] [address] [phone]   If scheduled at [facility], please arrive at the [facility] and Children's Entrance (Entrance C2) of [facility] 30 minutes prior to test start time. You can use the FREE valet parking offered at entrance C (encouraged to control the heart rate for the test)  Proceed to the [facility] Radiology Department (first floor) to check-in and test prep.  All radiology patients and guests should use entrance C2 at [facility], accessed from [facility], even though the hospital's physical address listed is [address]  If scheduled at the Heart and Vascular Tower at [address], please enter the parking lot using the [address] entrance and use the FREE valet service at the patient drop-off area. Enter the building and check-in with registration on the main floor.   Please follow these instructions carefully (unless otherwise  directed):  On the Day of the Test: Drink plenty of water until 1 hour prior to the test. Do not eat any food 1 hour prior to test. You may take your regular medications prior to the test.   After the Test: Drink plenty of water. After receiving IV contrast, you may experience a mild flushed feeling. This is normal. On occasion, you may experience a mild rash up to 24 hours after the test. This is not dangerous. If this occurs, you can take Benadryl 25 mg, Zyrtec, Claritin, or Allegra and increase your fluid intake. (Patients taking Tikosyn should avoid Benadryl, and may take Zyrtec, Claritin, or Allegra) If you experience trouble breathing, this can be serious. If it is severe call 911 IMMEDIATELY. If it is mild, please call our office.  We will call to schedule your test 2-4 weeks out understanding that some insurance companies will need an authorization prior to the service being performed.   For more information and frequently asked questions, please visit our website : [email or web address]  For non-scheduling related questions, please contact the cardiac imaging nurse navigator should you have any questions/concerns: Cardiac Imaging Nurse Navigators Direct Office Dial: [phone]   For scheduling needs, including cancellations and rescheduling, please call [place], [phone].    Follow-Up: At [facility], you and your health needs are our priority.  As part of our continuing mission to provide you with exceptional heart care, our providers are all part of one team.  This team includes your primary Cardiologist (physician) and Advanced Practice Providers or APPs (Physician Assistants and Nurse  Practitioners) who all work together to provide you with the care you need, when you need it.  Your next appointment:   1 year(s)  Provider:   Wilbert Bihari, MD

## 2023-12-11 ENCOUNTER — Ambulatory Visit: Payer: Self-pay | Admitting: Cardiology

## 2023-12-11 LAB — BASIC METABOLIC PANEL WITH GFR
BUN/Creatinine Ratio: 19 (ref 10–24)
BUN: 16 mg/dL (ref 8–27)
CO2: 23 mmol/L (ref 20–29)
Calcium: 9.3 mg/dL (ref 8.6–10.2)
Chloride: 101 mmol/L (ref 96–106)
Creatinine, Ser: 0.85 mg/dL (ref 0.76–1.27)
Glucose: 134 mg/dL — AB (ref 70–99)
Potassium: 4 mmol/L (ref 3.5–5.2)
Sodium: 138 mmol/L (ref 134–144)
eGFR: 95 mL/min/1.73 (ref >59)

## 2023-12-12 DIAGNOSIS — G4733 Obstructive sleep apnea (adult) (pediatric): Secondary | ICD-10-CM | POA: Diagnosis not present

## 2023-12-15 ENCOUNTER — Ambulatory Visit (HOSPITAL_COMMUNITY)
Admission: RE | Admit: 2023-12-15 | Discharge: 2023-12-15 | Disposition: A | Discharge status: Home / Self Care | Source: Ambulatory Visit | Attending: Cardiology | Admitting: Cardiology

## 2023-12-15 DIAGNOSIS — Z951 Presence of aortocoronary bypass graft: Secondary | ICD-10-CM | POA: Diagnosis not present

## 2023-12-15 DIAGNOSIS — I77810 Thoracic aortic ectasia: Secondary | ICD-10-CM | POA: Diagnosis present

## 2023-12-15 DIAGNOSIS — I7121 Aneurysm of the ascending aorta, without rupture: Secondary | ICD-10-CM | POA: Diagnosis not present

## 2023-12-15 DIAGNOSIS — R911 Solitary pulmonary nodule: Secondary | ICD-10-CM | POA: Insufficient documentation

## 2023-12-15 MED ORDER — IOHEXOL 350 MG/ML SOLN
75.0000 mL | Freq: Once | INTRAVENOUS | Status: AC | PRN
  Administered 2023-12-15: 75 mL via INTRAVENOUS

## 2023-12-18 NOTE — Telephone Encounter (Signed)
 Call to patient to discuss chest CT results, no answer, LVM asking patient to call our office to discuss results. [facility] sent.

## 2023-12-18 NOTE — Telephone Encounter (Signed)
-----   Message from Todd Frank sent at 12/17/2023  2:43 PM EDT ----- Stable ascending aortic dilatation at 4.2cm.  There is also a 4mm nodule in the RUL along a fissure.  Please get him in with Pulmonary for followup with Dr. Geronimo ----- Message ----- From: Interface, Rad Results In Sent: 12/17/2023   2:38 PM EDT To: Todd JONELLE Bihari, MD

## 2023-12-23 ENCOUNTER — Telehealth: Payer: Self-pay | Admitting: Cardiology

## 2023-12-23 DIAGNOSIS — I77810 Thoracic aortic ectasia: Secondary | ICD-10-CM

## 2023-12-23 DIAGNOSIS — R9389 Abnormal findings on diagnostic imaging of other specified body structures: Secondary | ICD-10-CM

## 2023-12-23 NOTE — Telephone Encounter (Signed)
 The patient has been notified of the result and verbalized understanding.  All questions (if any) were answered.  Patient is aware a referral will be placed for Pulmonary   to follow up - Dr Geronimo.  Patient is aware the pulm. Office will call him for an appt.  Patient would like to be seen in [place] if possible.  This will be placed on referral order.  Gladis Reena GAILS, RN 12/23/2023 10:25 AM

## 2023-12-23 NOTE — Telephone Encounter (Signed)
  Todd JONELLE Bihari, MD 12/17/2023  2:43 PM EDT     Stable ascending aortic dilatation at 4.2cm.  There is also a 4mm nodule in the RUL along a fissure.  Please get him in with Pulmonary for followup with Dr. Geronimo

## 2023-12-23 NOTE — Telephone Encounter (Signed)
Patient states he was returning call for results. Please advise

## 2023-12-24 ENCOUNTER — Encounter: Payer: Self-pay | Admitting: Internal Medicine

## 2023-12-24 ENCOUNTER — Ambulatory Visit: Admitting: Internal Medicine

## 2023-12-24 VITALS — BP 112/80 | HR 67 | Temp 98.9°F | Ht 69.0 in | Wt 185.0 lb

## 2023-12-24 DIAGNOSIS — R9389 Abnormal findings on diagnostic imaging of other specified body structures: Secondary | ICD-10-CM

## 2023-12-24 DIAGNOSIS — Z87891 Personal history of nicotine dependence: Secondary | ICD-10-CM | POA: Diagnosis not present

## 2023-12-24 DIAGNOSIS — R918 Other nonspecific abnormal finding of lung field: Secondary | ICD-10-CM

## 2023-12-24 NOTE — Patient Instructions (Signed)
 Follow up CT chest as ordered by Dr. Shlomo cardiology

## 2023-12-24 NOTE — Progress Notes (Unsigned)
 [facility] [place] Pulmonary Medicine Consultation      Date: 12/24/2023,   MRN# [id number] Jazmin Vensel 22-Jul-1955       CHIEF COMPLAINT:   Assessment of abnormal CT chest   HISTORY OF PRESENT ILLNESS   68 year old pleasant white male seen today for abnormal CT chest Patient has history of aortic aneurysm as well as aortic valve repair patient gets follow-up CT scans annually by his cardiologist Dr. Shlomo  Most recent CT scan September 2025 shows 4 mm lung nodule on the right lung I have reviewed the scans in detail with the patient I have reviewed the scan from 2023 which shows similar appearance of this right lung nodule  Patient is a non-smoker Nonalcoholic  No exacerbation at this time No evidence of heart failure at this time No evidence or signs of infection at this time No respiratory distress No fevers, chills, nausea, vomiting, diarrhea No evidence of lower extremity edema No evidence hemoptysis  No significant respiratory or cardiac issues at this time   CT chest September 2025 Subcentimeter right lung nodule    CT chest January 2023 reviewed in detail with patient Similar appearance of this right lung nodule subcentimeter    Findings are benign in appearance over the last several years  PAST MEDICAL HISTORY   Past Medical History:  Diagnosis Date   Aortic stenosis    a. syncope/severe AS s/p bioprosthetic AVR 06/2017.   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 s/p 2V CABG at time of AVR 06/2017.   Carotid bruit    carotid dopplers negative.  Bruit due to heart murmur from AS   Cataract 11/2015   mild   Congenital nevus    Diastolic dysfunction    Dysplastic nevus 12/12/2008   Left mid back, 9cm lat. to spine. Moderate atypia,  extends to one edge.    Dysplastic nevus 12/12/2008   Left mid to low back, 7cm lat. to spine. Moderate atypia, close to margin.    Dysplastic nevus 09/24/2012   Right mid lateral buttocks. Moderate atypia, close to edge.    Dysplastic nevus 08/15/2020   Left mid back infrascapular - excised 09/25/20   Dysplastic nevus 10/02/2020   left post deltoid near tricep, mild atypia   Dysplastic nevus 10/02/2020   right ant lateral prox thigh, severe, Shave removal 11/19/20   Dysplastic nevus 04/11/2021   right mid back lateral near side, moderate atypia   Dysplastic nevus 04/11/2021   left upper back paraspinal, moderate atypia   Dysplastic nevus 04/14/2022   left upper back paraspinal, mod to severe   GERD (gastroesophageal reflux disease)    Headache    optical migraine   Hx of melanoma in situ 06/26/2009   Left superior thigh. Arising in dysplastic nevus, close to margin. Excised 07/11/2009, margins free.   Hyperlipidemia    LDL goal < 70   LVH (left ventricular hypertrophy)    Melanoma of skin (HCC)    Myocardial infarction [facility]) unknown   silent MI as noted by Dr. Fleeta Ochoa   Obesity    OSA on CPAP    mild obstructive sleep apnea with an AHI of 6.5/h and no significant central sleep apnea  and is on CPAP.   PFO (patent foramen ovale)    small by echo 2014, but not demonstrated during intra-op note from CABG/AVR 06/2017.   Sleep apnea      SURGICAL HISTORY   Past Surgical History:  Procedure Laterality Date   AORTIC VALVE REPLACEMENT N/A 06/25/2017   Procedure: AORTIC VALVE REPLACEMENT (AVR) using Magna Ease Aortic Valve size 23mm;  Surgeon: Fleeta Hanford Coy, MD;  Location: [facility] OR;  Service: Open Heart Surgery;  Laterality: N/A;   arthritic cyst removal  2017   from Lumbar 4-5, dr pool   CARDIAC CATHETERIZATION N/A 02/20/2016   Procedure: Right/Left Heart Cath and Coronary Angiography;  Surgeon: Victory LELON Sharps, MD;  Location: [facility] INVASIVE CV LAB;  Service: Cardiovascular;   Laterality: N/A;   CARDIAC VALVE REPLACEMENT  06/25/2017   aortic valve replaced with tissue valve   CORONARY ARTERY BYPASS GRAFT N/A 06/25/2017   Procedure: CORONARY ARTERY BYPASS GRAFTING (CABG) x Two , using left internal mammary artery and right leg greater saphenous vein harvested endoscopically;  Surgeon: Fleeta Hanford Coy, MD;  Location: [facility] OR;  Service: Open Heart Surgery;  Laterality: N/A;   CORONARY/GRAFT ANGIOGRAPHY N/A 06/02/2017   Procedure: CORONARY/GRAFT ANGIOGRAPHY;  Surgeon: Mady Bruckner, MD;  Location: [facility] INVASIVE CV LAB;  Service: Cardiovascular;  Laterality: N/A;   EYE SURGERY  1967   strabismus muscle surgery due to amblyopia   MELANOMA EXCISION  2011   left thigh   RIGHT HEART CATH N/A 06/02/2017   Procedure: RIGHT HEART CATH;  Surgeon: Mady Bruckner, MD;  Location: [facility] INVASIVE CV LAB;  Service: Cardiovascular;  Laterality: N/A;   SPINE SURGERY  03/2016   synovial cyst removed between L4 and L5   TEE WITHOUT CARDIOVERSION N/A 06/25/2017   Procedure: TRANSESOPHAGEAL ECHOCARDIOGRAM (TEE);  Surgeon: Fleeta Hanford, Coy, MD;  Location: [facility] OR;  Service: Open Heart Surgery;  Laterality: N/A;   TONSILLECTOMY       FAMILY HISTORY   Family History  Problem Relation Age of Onset   Diabetes Mother    Heart disease Mother    Hearing loss Mother    Vision loss Mother    Arthritis Father    Stroke Father    Varicose Veins Father    Arthritis Sister    Heart disease Paternal Grandmother        MI   Heart attack Other        family history   Pancreatic cancer Maternal Aunt    Arthritis Sister    Cancer Maternal Aunt    Cancer Maternal Aunt      SOCIAL HISTORY   Social History   Tobacco Use   Smoking status: Former    Current packs/day: 0.00    Types: Cigarettes    Quit date: 04/07/1976    Years since quitting: 47.7   Smokeless tobacco: Former   Tobacco comments:    quit in 78, teenager  Vaping Use   Vaping status: Never Used  Substance Use Topics    Alcohol use: Yes    Alcohol/week: 1.0 standard drink of alcohol    Types: 1 Cans of beer per week    Comment: occasional   Drug use: No    Comment: quit marijuana in 1990     MEDICATIONS    Home Medication:  Current Outpatient Rx   Order #: [id number] Class: Normal   Order #: [id number] Class: Historical Med   Order #: [id number] Class: Historical Med   Order #: [id number] Class: Normal  Order #: [id number] Class: Normal   Order #: [id number] Class: Historical Med   Order #: [id number] Class: Historical Med    Current Medication:  Current Outpatient Medications:    amLODipine  (NORVASC ) 5 MG tablet, TAKE 1 TABLET(5 MG) BY MOUTH DAILY, Disp: 90 tablet, Rfl: 1   amoxicillin  (AMOXIL ) 500 MG capsule, SMARTSIG:4 Tablet(s) By Mouth, Disp: , Rfl:    aspirin  EC 81 MG tablet, Take 81 mg by mouth daily., Disp: , Rfl:    atorvastatin  (LIPITOR ) 80 MG tablet, TAKE 1 TABLET(80 MG) BY MOUTH AT BEDTIME, Disp: 90 tablet, Rfl: 3   azelastine  (ASTELIN ) 0.1 % nasal spray, Place 2 sprays into both nostrils 2 (two) times daily. Use in each nostril as directed, Disp: 30 mL, Rfl: 5   cholecalciferol (VITAMIN D ) 1000 units tablet, Take 1,000 Units by mouth every evening., Disp: , Rfl:    Coenzyme Q10 (COQ-10) 200 MG CAPS, Take 200 mg by mouth daily., Disp: , Rfl:     ALLERGIES   Patient has no known allergies.  BP 112/80   Pulse 67   Temp 98.9 F (37.2 C)   Ht 5' 9 (1.753 m)   Wt 185 lb (83.9 kg)   SpO2 97%   BMI 27.32 kg/m    Review of Systems: Gen:  Denies  fever, sweats, chills weight loss  HEENT: Denies blurred vision, double vision, ear pain, eye pain, hearing loss, nose bleeds, sore throat Cardiac:  No dizziness, chest pain or heaviness, chest tightness,edema, No JVD Resp:   No cough, -sputum production, -shortness of breath,-wheezing, -hemoptysis,  Other:  All other systems negative   Physical Examination:   General Appearance: No distress  EYES PERRLA, EOM intact.   NECK Supple, No  JVD Pulmonary: normal breath sounds, No wheezing.  CardiovascularNormal S1,S2.  No m/r/g.   Abdomen: Benign, Soft, non-tender. Neurology UE/LE 5/5 strength, no focal deficits Ext pulses intact, cap refill intact ALL OTHER ROS ARE NEGATIVE      IMAGING    CT ANGIO CHEST AORTA W/CM & OR WO/CM Result Date: 12/17/2023 CLINICAL DATA:  Thoracic aortic aneurysm. EXAM: CT ANGIOGRAPHY CHEST WITH CONTRAST TECHNIQUE: Multidetector CT imaging of the chest was performed using the standard protocol during bolus administration of intravenous contrast. Multiplanar CT image reconstructions and MIPs were obtained to evaluate the vascular anatomy. RADIATION DOSE REDUCTION: This exam was performed according to the departmental dose-optimization program which includes automated exposure control, adjustment of the mA and/or kV according to patient size and/or use of iterative reconstruction technique. CONTRAST:  75mL OMNIPAQUE  IOHEXOL  350 MG/ML SOLN COMPARISON:  April 23, 2023. FINDINGS: Cardiovascular: Grossly stable 4.2 cm ascending thoracic aortic aneurysm without dissection. Great vessels are widely patent. Status post coronary artery bypass graft and aortic valve repair. Normal cardiac size. No pericardial effusion. Mediastinum/Nodes: No enlarged mediastinal, hilar, or axillary lymph nodes. Thyroid  gland, trachea, and esophagus demonstrate no significant findings. Lungs/Pleura: No pneumothorax or pleural effusion is noted. Stable 4 mm perifissural nodule is noted in right upper lobe along right minor fissure best seen on image number 59 of series 303. Upper Abdomen: No acute abnormality. Musculoskeletal: No chest wall abnormality. No acute or significant osseous findings. Review of the MIP images confirms the above findings. IMPRESSION: Grossly stable 4.2 cm ascending thoracic aortic aneurysm. Recommend annual imaging followup by CTA or MRA. This recommendation follows 2010 ACCF/AHA/AATS/ACR/ASA/SCA/SCAI/SIR/STS/SVM  Guidelines for the Diagnosis and Management of Patients with Thoracic Aortic Disease. Circulation. 2010; 121: Z733-z630. Aortic aneurysm NOS (ICD10-I71.9). Stable 4 mm perifissural nodule  seen in right upper lobe along right minor fissure. Attention to this on follow-up imaging is recommended. Electronically Signed   By: Lynwood Landy Raddle M.D.   On: 12/17/2023 14:36      ASSESSMENT/PLAN    68 year old pleasant white male seen today for abnormal CT chest After further evaluation, when comparing several CT scans over the last several years there is a right middle lobe subcentimeter nodule since 2023 which seems to be more of a tortuous blood vessel Most likely benign etiology  At this time recommend follow-up CT scans as scheduled with Dr. Shlomo with cardiology to assess his is ending aortic aneurysm  No further testing needed at this time No exacerbation at this time No evidence of heart failure at this time No evidence or signs of infection at this time No respiratory distress No fevers, chills, nausea, vomiting, diarrhea No evidence of lower extremity edema No evidence hemoptysis      CURRENT MEDICATIONS REVIEWED AT LENGTH WITH PATIENT TODAY   Patient  satisfied with Plan of action and management. All questions answered   Follow up 6 months   I spent a total of 60 minutes dedicated to the care of this patient on the date of this encounter to include pre-visit review of records, face-to-face time with the patient discussing conditions above, post visit ordering of testing, clinical documentation with the electronic health record, making appropriate referrals as documented, and communicating necessary information to the patient's healthcare team.    The Patient requires high complexity decision making for assessment and support, frequent evaluation and titration of therapies, application of advanced monitoring technologies and extensive interpretation of multiple databases.   Patient satisfied with Plan of action and management. All questions answered    Nickolas Alm Cellar, M.D.  [facility]  Medical Director [facility] Medical Director [facility] Cardio-Pulmonary Department

## 2024-01-06 ENCOUNTER — Ambulatory Visit: Payer: Self-pay | Admitting: Cardiology

## 2024-01-11 DIAGNOSIS — G4733 Obstructive sleep apnea (adult) (pediatric): Secondary | ICD-10-CM | POA: Diagnosis not present

## 2024-01-14 ENCOUNTER — Encounter: Payer: Self-pay | Admitting: Family Medicine

## 2024-01-28 DIAGNOSIS — G4733 Obstructive sleep apnea (adult) (pediatric): Secondary | ICD-10-CM | POA: Diagnosis not present

## 2024-02-11 DIAGNOSIS — G4733 Obstructive sleep apnea (adult) (pediatric): Secondary | ICD-10-CM | POA: Diagnosis not present

## 2024-03-12 DIAGNOSIS — G4733 Obstructive sleep apnea (adult) (pediatric): Secondary | ICD-10-CM | POA: Diagnosis not present

## 2024-04-05 ENCOUNTER — Encounter: Payer: Self-pay | Admitting: Family Medicine

## 2024-04-06 ENCOUNTER — Other Ambulatory Visit: Payer: Self-pay

## 2024-04-06 DIAGNOSIS — J3089 Other allergic rhinitis: Secondary | ICD-10-CM

## 2024-04-06 MED ORDER — AZELASTINE HCL 0.1 % NA SOLN: 2.0000 | Freq: Two times a day (BID) | NASAL | 5 refills | Status: AC

## 2024-04-18 ENCOUNTER — Ambulatory Visit (HOSPITAL_COMMUNITY)
Admission: RE | Admit: 2024-04-18 | Discharge: 2024-04-18 | Disposition: A | Discharge status: Home / Self Care | Source: Ambulatory Visit | Attending: Internal Medicine | Admitting: Internal Medicine

## 2024-04-18 ENCOUNTER — Ambulatory Visit: Payer: Self-pay | Admitting: Cardiology

## 2024-04-18 DIAGNOSIS — I7121 Aneurysm of the ascending aorta, without rupture: Secondary | ICD-10-CM | POA: Insufficient documentation

## 2024-04-18 DIAGNOSIS — I5030 Unspecified diastolic (congestive) heart failure: Secondary | ICD-10-CM

## 2024-04-18 DIAGNOSIS — I77810 Thoracic aortic ectasia: Secondary | ICD-10-CM | POA: Diagnosis present

## 2024-04-18 DIAGNOSIS — I082 Rheumatic disorders of both aortic and tricuspid valves: Secondary | ICD-10-CM | POA: Diagnosis not present

## 2024-04-18 LAB — ECHOCARDIOGRAM COMPLETE
AR max vel: 1.49 cm2
AV Area VTI: 1.55 cm2
AV Area mean vel: 1.44 cm2
AV Mean grad: 19.4 mmHg
AV Peak grad: 33.4 mmHg
Ao pk vel: 2.89 m/s
Area-P 1/2: 2.49 cm2
S' Lateral: 3.2 cm

## 2024-04-18 NOTE — Telephone Encounter (Signed)
-----   Message from Wilbert Bihari, MD sent at 04/18/2024  1:08 PM EST ----- Echo showed normal pumping function of the heart muscle with increased stiffness of heart muscle called diastolic dysfunction.  Normal RV, mildly enlarged left atrium.  The AVR is mildly stenotic.   THe ascending aorta is mildly enlarged at 44mm (42mm by chest CTA 12/2023)..  No significant change from prior echo.

## 2024-04-18 NOTE — Telephone Encounter (Signed)
 Call to patient to advise:  Echo showed normal pumping function of the heart muscle with increased stiffness of heart muscle called diastolic dysfunction.  Normal RV, mildly enlarged left atrium.  The AVR is mildly stenotic.    THe ascending aorta is mildly enlarged at 44mm (42mm by chest CTA 12/2023)..  No significant change from prior echo.   Patient verbalizes understanding, all questions answered.

## 2024-05-04 ENCOUNTER — Other Ambulatory Visit: Payer: Medicare PPO

## 2024-05-04 DIAGNOSIS — Z125 Encounter for screening for malignant neoplasm of prostate: Secondary | ICD-10-CM

## 2024-05-04 DIAGNOSIS — R351 Nocturia: Secondary | ICD-10-CM

## 2024-05-04 DIAGNOSIS — E7800 Pure hypercholesterolemia, unspecified: Secondary | ICD-10-CM

## 2024-05-04 DIAGNOSIS — R7309 Other abnormal glucose: Secondary | ICD-10-CM

## 2024-05-04 DIAGNOSIS — D696 Thrombocytopenia, unspecified: Secondary | ICD-10-CM

## 2024-05-04 DIAGNOSIS — Z0000 Encounter for general adult medical examination without abnormal findings: Secondary | ICD-10-CM

## 2024-05-04 DIAGNOSIS — I10 Essential (primary) hypertension: Secondary | ICD-10-CM

## 2024-05-05 LAB — CBC WITH DIFFERENTIAL/PLATELET
Absolute Lymphocytes: 996 {cells}/uL (ref 850–3900)
Absolute Monocytes: 472 {cells}/uL (ref 200–950)
Basophils Absolute: 70 {cells}/uL (ref 0–200)
Basophils Relative: 1.7 %
Eosinophils Absolute: 221 {cells}/uL (ref 15–500)
Eosinophils Relative: 5.4 %
HCT: 48.5 % (ref 39.4–51.1)
Hemoglobin: 16.1 g/dL (ref 13.2–17.1)
MCH: 30.8 pg (ref 27.0–33.0)
MCHC: 33.2 g/dL (ref 31.6–35.4)
MCV: 92.9 fL (ref 81.4–101.7)
MPV: 12.3 fL (ref 7.5–12.5)
Monocytes Relative: 11.5 %
Neutro Abs: 2341 {cells}/uL (ref 1500–7800)
Neutrophils Relative %: 57.1 %
Platelets: 122 10*3/uL — ABNORMAL LOW (ref 140–400)
RBC: 5.22 Million/uL (ref 4.20–5.80)
RDW: 12.6 % (ref 11.0–15.0)
Total Lymphocyte: 24.3 %
WBC: 4.1 10*3/uL (ref 3.8–10.8)

## 2024-05-05 LAB — HEMOGLOBIN A1C
Hgb A1c MFr Bld: 5.2 %
Mean Plasma Glucose: 103 mg/dL
eAG (mmol/L): 5.7 mmol/L

## 2024-05-05 LAB — COMPLETE METABOLIC PANEL WITHOUT GFR
AG Ratio: 2 (calc) (ref 1.0–2.5)
ALT: 25 U/L (ref 9–46)
AST: 25 U/L (ref 10–35)
Albumin: 4.4 g/dL (ref 3.6–5.1)
Alkaline phosphatase (APISO): 101 U/L (ref 35–144)
BUN: 12 mg/dL (ref 7–25)
CO2: 28 mmol/L (ref 20–32)
Calcium: 9.6 mg/dL (ref 8.6–10.3)
Chloride: 102 mmol/L (ref 98–110)
Creat: 0.72 mg/dL (ref 0.70–1.35)
Globulin: 2.2 g/dL (ref 1.9–3.7)
Glucose, Bld: 88 mg/dL (ref 65–99)
Potassium: 4.3 mmol/L (ref 3.5–5.3)
Sodium: 136 mmol/L (ref 135–146)
Total Bilirubin: 1.3 mg/dL — ABNORMAL HIGH (ref 0.2–1.2)
Total Protein: 6.6 g/dL (ref 6.1–8.1)

## 2024-05-05 LAB — LIPID PANEL
Cholesterol: 100 mg/dL
HDL: 54 mg/dL
LDL Cholesterol (Calc): 31 mg/dL
Non-HDL Cholesterol (Calc): 46 mg/dL
Total CHOL/HDL Ratio: 1.9 (calc)
Triglycerides: 69 mg/dL

## 2024-05-05 LAB — PSA: PSA: 0.82 ng/mL

## 2024-05-10 ENCOUNTER — Ambulatory Visit: Payer: Medicare PPO | Admitting: Family Medicine

## 2024-05-10 ENCOUNTER — Encounter: Payer: Self-pay | Admitting: Family Medicine

## 2024-05-10 ENCOUNTER — Ambulatory Visit: Admitting: Family Medicine

## 2024-05-10 ENCOUNTER — Other Ambulatory Visit: Payer: Self-pay | Admitting: Family Medicine

## 2024-05-10 VITALS — BP 128/74 | HR 85 | Ht 69.0 in | Wt 187.1 lb

## 2024-05-10 DIAGNOSIS — G4733 Obstructive sleep apnea (adult) (pediatric): Secondary | ICD-10-CM | POA: Diagnosis not present

## 2024-05-10 DIAGNOSIS — I2510 Atherosclerotic heart disease of native coronary artery without angina pectoris: Secondary | ICD-10-CM

## 2024-05-10 DIAGNOSIS — R7309 Other abnormal glucose: Secondary | ICD-10-CM

## 2024-05-10 DIAGNOSIS — I7121 Aneurysm of the ascending aorta, without rupture: Secondary | ICD-10-CM | POA: Diagnosis not present

## 2024-05-10 DIAGNOSIS — Z951 Presence of aortocoronary bypass graft: Secondary | ICD-10-CM

## 2024-05-10 DIAGNOSIS — E7800 Pure hypercholesterolemia, unspecified: Secondary | ICD-10-CM

## 2024-05-10 DIAGNOSIS — I517 Cardiomegaly: Secondary | ICD-10-CM | POA: Diagnosis not present

## 2024-05-10 DIAGNOSIS — K5900 Constipation, unspecified: Secondary | ICD-10-CM

## 2024-05-10 DIAGNOSIS — D696 Thrombocytopenia, unspecified: Secondary | ICD-10-CM

## 2024-05-10 DIAGNOSIS — I10 Essential (primary) hypertension: Secondary | ICD-10-CM | POA: Diagnosis not present

## 2024-05-10 DIAGNOSIS — I350 Nonrheumatic aortic (valve) stenosis: Secondary | ICD-10-CM | POA: Diagnosis not present

## 2024-05-10 DIAGNOSIS — Z0000 Encounter for general adult medical examination without abnormal findings: Secondary | ICD-10-CM | POA: Diagnosis not present

## 2024-05-10 DIAGNOSIS — Z125 Encounter for screening for malignant neoplasm of prostate: Secondary | ICD-10-CM

## 2024-05-10 DIAGNOSIS — N401 Enlarged prostate with lower urinary tract symptoms: Secondary | ICD-10-CM

## 2024-05-10 MED ORDER — AMLODIPINE BESYLATE 5 MG PO TABS: 5.0000 mg | ORAL_TABLET | Freq: Every day | ORAL | 3 refills | Status: AC

## 2024-05-10 NOTE — Patient Instructions (Addendum)
 Thank you for coming to the office today.  Keep up the great work!  For Constipation (less frequent bowel movement that can be hard dry or involve straining).  Recommend trying OTC Miralax  17g = 1 capful in large glass water once daily for now, try several days to see if working, goal is soft stool or BM 1-2 times daily, if too loose then reduce dose or try every other day. If not effective may need to increase it to 2 doses at once in AM or may do 1 in morning and 1 in afternoon/evening  - This medicine is very safe and can be used often without any problem and will not make you dehydrated. It is good for use on AS NEEDED BASIS or even MAINTENANCE therapy for longer term for several days to weeks at a time to help regulate bowel movements  Other more natural remedies or preventative treatment: - Increase hydration with water - Increase fiber in diet (high fiber foods = vegetables, leafy greens, oats/grains) - May take OTC Fiber supplement (metamucil powder or pill/gummy) - May try OTC Probiotic   DUE for FASTING BLOOD WORK (no food or drink after midnight before the lab appointment, only water or coffee without cream/sugar on the morning of)  SCHEDULE Lab Only visit in the morning at the clinic for lab draw in 1 YEAR  - Make sure Lab Only appointment is at about 1 week before your next appointment, so that results will be available  For Lab Results, once available within 2-3 days of blood draw, you can can log in to MyChart online to view your results and a brief explanation. Also, we can discuss results at next follow-up visit.   Please schedule a Follow-up Appointment to: Return for 1 year fasting lab > 1 week later Annual Physical.  If you have any other questions or concerns, please feel free to call the office or send a message through MyChart. You may also schedule an earlier appointment if necessary.  Additionally, you may be receiving a survey about your experience at our  office within a few days to 1 week by e-mail or mail. We value your feedback.  Marsa Officer, DO [facility], [place]

## 2024-05-18 ENCOUNTER — Ambulatory Visit: Payer: Medicare PPO | Admitting: Dermatology

## 2025-05-04 ENCOUNTER — Other Ambulatory Visit

## 2025-05-12 ENCOUNTER — Encounter: Admitting: Family Medicine
